# Patient Record
Sex: Female | Born: 1965 | Race: Black or African American | Hispanic: No | Marital: Single | Smoking: Never smoker
Health system: Southern US, Community
[De-identification: ages and names within clinical notes are randomized; demographics above are authoritative.]

## PROBLEM LIST (undated history)

## (undated) DIAGNOSIS — J40 Bronchitis, not specified as acute or chronic: Secondary | ICD-10-CM

## (undated) DIAGNOSIS — M19049 Primary osteoarthritis, unspecified hand: Secondary | ICD-10-CM

## (undated) DIAGNOSIS — I10 Essential (primary) hypertension: Secondary | ICD-10-CM

## (undated) DIAGNOSIS — K219 Gastro-esophageal reflux disease without esophagitis: Secondary | ICD-10-CM

## (undated) DIAGNOSIS — K5732 Diverticulitis of large intestine without perforation or abscess without bleeding: Secondary | ICD-10-CM

## (undated) DIAGNOSIS — N7011 Chronic salpingitis: Secondary | ICD-10-CM

## (undated) DIAGNOSIS — E538 Deficiency of other specified B group vitamins: Secondary | ICD-10-CM

## (undated) DIAGNOSIS — D649 Anemia, unspecified: Secondary | ICD-10-CM

## (undated) DIAGNOSIS — E785 Hyperlipidemia, unspecified: Secondary | ICD-10-CM

## (undated) DIAGNOSIS — E039 Hypothyroidism, unspecified: Secondary | ICD-10-CM

## (undated) HISTORY — DX: Gastro-esophageal reflux disease without esophagitis: K21.9

## (undated) HISTORY — DX: Hypothyroidism, unspecified: E03.9

## (undated) HISTORY — PX: WISDOM TOOTH EXTRACTION: SHX21

## (undated) HISTORY — PX: TUBAL LIGATION: SHX77

## (undated) HISTORY — PX: DILATION AND CURETTAGE OF UTERUS: SHX78

## (undated) HISTORY — DX: Hyperlipidemia, unspecified: E78.5

## (undated) HISTORY — DX: Diverticulitis of large intestine without perforation or abscess without bleeding: K57.32

---

## 2001-02-25 DIAGNOSIS — E039 Hypothyroidism, unspecified: Secondary | ICD-10-CM

## 2001-02-25 HISTORY — DX: Hypothyroidism, unspecified: E03.9

## 2004-02-26 HISTORY — PX: TOTAL ABDOMINAL HYSTERECTOMY: SHX209

## 2005-02-25 HISTORY — PX: WRIST ARTHROSCOPY: SUR100

## 2005-03-08 ENCOUNTER — Emergency Department: Payer: Self-pay | Admitting: Emergency Medicine

## 2005-03-08 ENCOUNTER — Other Ambulatory Visit: Payer: Self-pay

## 2005-06-20 ENCOUNTER — Ambulatory Visit (HOSPITAL_BASED_OUTPATIENT_CLINIC_OR_DEPARTMENT_OTHER): Admission: RE | Admit: 2005-06-20 | Discharge: 2005-06-20 | Payer: Self-pay | Admitting: Orthopedic Surgery

## 2005-06-21 ENCOUNTER — Encounter (INDEPENDENT_AMBULATORY_CARE_PROVIDER_SITE_OTHER): Payer: Self-pay | Admitting: Specialist

## 2005-08-21 ENCOUNTER — Emergency Department (HOSPITAL_COMMUNITY): Admission: EM | Admit: 2005-08-21 | Discharge: 2005-08-22 | Payer: Self-pay | Admitting: Emergency Medicine

## 2005-08-29 ENCOUNTER — Encounter (INDEPENDENT_AMBULATORY_CARE_PROVIDER_SITE_OTHER): Payer: Self-pay | Admitting: Specialist

## 2005-08-29 ENCOUNTER — Ambulatory Visit (HOSPITAL_BASED_OUTPATIENT_CLINIC_OR_DEPARTMENT_OTHER): Admission: RE | Admit: 2005-08-29 | Discharge: 2005-08-29 | Payer: Self-pay | Admitting: Orthopedic Surgery

## 2005-10-18 ENCOUNTER — Emergency Department (HOSPITAL_COMMUNITY): Admission: EM | Admit: 2005-10-18 | Discharge: 2005-10-18 | Payer: Self-pay | Admitting: Emergency Medicine

## 2006-02-25 HISTORY — PX: CHOLECYSTECTOMY: SHX55

## 2006-04-08 ENCOUNTER — Ambulatory Visit (HOSPITAL_COMMUNITY): Admission: RE | Admit: 2006-04-08 | Discharge: 2006-04-08 | Payer: Self-pay | Admitting: Family Medicine

## 2006-04-08 ENCOUNTER — Emergency Department (HOSPITAL_COMMUNITY): Admission: EM | Admit: 2006-04-08 | Discharge: 2006-04-08 | Payer: Self-pay | Admitting: Family Medicine

## 2006-05-14 ENCOUNTER — Encounter (INDEPENDENT_AMBULATORY_CARE_PROVIDER_SITE_OTHER): Payer: Self-pay | Admitting: Specialist

## 2006-05-14 ENCOUNTER — Ambulatory Visit (HOSPITAL_COMMUNITY): Admission: RE | Admit: 2006-05-14 | Discharge: 2006-05-15 | Payer: Self-pay | Admitting: Surgery

## 2007-02-07 ENCOUNTER — Emergency Department (HOSPITAL_COMMUNITY): Admission: EM | Admit: 2007-02-07 | Discharge: 2007-02-07 | Payer: Self-pay | Admitting: Emergency Medicine

## 2007-02-10 ENCOUNTER — Emergency Department (HOSPITAL_COMMUNITY): Admission: EM | Admit: 2007-02-10 | Discharge: 2007-02-10 | Payer: Self-pay | Admitting: Emergency Medicine

## 2007-05-29 ENCOUNTER — Encounter: Admission: RE | Admit: 2007-05-29 | Discharge: 2007-05-29 | Payer: Self-pay | Admitting: Family Medicine

## 2007-12-07 ENCOUNTER — Encounter: Admission: RE | Admit: 2007-12-07 | Discharge: 2007-12-07 | Payer: Self-pay | Admitting: Family Medicine

## 2008-03-21 ENCOUNTER — Encounter: Admission: RE | Admit: 2008-03-21 | Discharge: 2008-03-21 | Payer: Self-pay | Admitting: Family Medicine

## 2008-12-09 ENCOUNTER — Encounter: Admission: RE | Admit: 2008-12-09 | Discharge: 2008-12-09 | Payer: Self-pay | Admitting: Family Medicine

## 2009-03-29 ENCOUNTER — Encounter: Admission: RE | Admit: 2009-03-29 | Discharge: 2009-03-29 | Payer: Self-pay | Admitting: Family Medicine

## 2009-06-13 ENCOUNTER — Encounter: Admission: RE | Admit: 2009-06-13 | Discharge: 2009-06-13 | Payer: Self-pay | Admitting: Family Medicine

## 2010-07-04 ENCOUNTER — Encounter: Payer: Self-pay | Admitting: Family Medicine

## 2010-07-13 NOTE — Op Note (Signed)
NAME:  Ashley [name], Ashley [name]             ACCOUNT NO.:  [id number]   MEDICAL RECORD NO.:  [id number]          PATIENT TYPE:  AMB   LOCATION:  DAY                          FACILITY:  [facility]   PHYSICIAN:  Thomas A. Cornett, M.D.DATE OF BIRTH:  January 10, 1966   DATE OF PROCEDURE:  05/14/2006  DATE OF DISCHARGE:                               OPERATIVE REPORT   PREOPERATIVE DIAGNOSIS:  Symptomatic cholelithiasis.   POSTOPERATIVE DIAGNOSIS:  Symptomatic cholelithiasis.   PROCEDURE:  Laparoscopic cholecystectomy with intraoperative  cholangiogram.   SURGEON:  Thomas A. Cornett, M.D.   ASSISTANT:  Leonie Man, M.D.   ANESTHESIA:  General endotracheal anesthesia with approximately 20 cc of  0.25% Sensorcaine with epinephrine.   ESTIMATED BLOOD LOSS:  20 cc.   SPECIMENS:  Gallbladder with gallstones to pathology.   DRAINS:  None.   INDICATIONS FOR PROCEDURE:  The patient is a 45 year old female with  symptomatic cholelithiasis.  She presents today for laparoscopic  cholecystectomy.  She has treatment of this.  Informed consent was  obtained.  Risks and benefits were discussed preoperatively with the  patient.  She understood and agreed to proceed.   DESCRIPTION OF PROCEDURE:  The patient is brought to the operating suite  and placed supine.  After induction of general endotracheal anesthesia,  the abdomen was prepped and draped in a sterile fashion.  A 1 cm  supraumbilical incision was made.  This was carried down to her fascia.  The fascia was opened in the midline, and two Kochers were used to grab  the fascia.  A small hemostat was used to open the peritoneal lining to  the abdominal cavity.  There was no evidence of any intra-abdominal  adhesions.  A purse-string suture of 0 Vicryl was then placed, and a 12  mm Hasson cannula was placed under direct vision.  Pneumoperitoneum was  created to 15 mmHg of CO2, and a laparoscope was placed.  There were  some wispy adhesions in her pelvis  from previous hysterectomy.  Otherwise, no other major abnormality was noted upon brief inspection of  the intra-abdominal contents.  Gallbladder was identified and had  changes of chronic cholecystitis.  A subxiphoid port was placed with  local anesthesia under direct vision.  Two 5 mm ports were placed in the  right upper quadrant under direct vision as well.  The dome of the  gallbladder was grasped and pushed towards the patient's right shoulder.  A second grasper is used to hold some omental adhesions away from the  anterior surface of the gallbladder, and cautery is used to take these  down.  The infundibulum is then identified, grabbed by a grasper, and  pulled from the patient's right lower quadrant.  The peritoneum was  cauterized at the gallbladder and pushed down.  The cystic duct was  identified.  This is the only tubular structure entered into the  gallbladder.  This is dissected out circumferentially.  A clip was  placed on the gallbladder side of the cystic duct, and a small incision  was made with the laparoscopic scissors.  Through a separate stab wound,  a  Cook cholangiogram catheter is introduced, placed in the cystic duct,  and held in place by a clip.  Intraoperative cholangiogram was then  performed using 0.5% strength Hypaque dye and fluoroscopy.  There is  free flow of contrast down a tortuous cystic duct, down a common duct,  into the duodenum without signs of stones, stricture, or extravasation.  There is free flow of contrast at the common hepatic duct to the  bifurcation of right and left hepatic duct.  There is no dilatation or  filling defects noted in either.  The cholangiogram was completed.  The  catheter was removed.  The cystic duct was then triple clipped and  divided.  The cystic artery was identified.  At this point, it was  double-clipped and divided.  The cautery was used to dissect the  gallbladder from the gallbladder fossa.  The gallbladder was  partially  intrahepatic.  The gallbladder was then removed from the gallbladder  fossa and placed in an EndoCatch bag.  There was some oozing from the  gallbladder bed, and this was controlled with cautery and Surgicel with  an excellent response.  Irrigation was used and suctioned out.  The  cystic duct and cystic artery were identified.  There is no evidence of  bile or bleeding.  At this point in time, the gallbladder was extracted  to the umbilicus and passed off the field.  The umbilical port was  closed with a purse-string suture of Vicryl already in place.  At this  point in time, excess fluid was suctioned out.  I reinspected the  abdominal cavity and saw no evidence of injury.  The ports were removed.  CO2 was allowed to escape.  The instruments were passed off the field.  The skin was closed with 4-0 Monocryl.  Dry dressings were applied.  All  final counts of sponge, instrument, and needles were found to be  correct.  The patient was awakened and taken to recovery in satisfactory  condition.      Thomas A. Cornett, M.D.  Electronically Signed     TAC/MEDQ  D:  05/14/2006  T:  05/14/2006  Job:  [id number]   cc:   Elvina Sidle, M.D.  Fax: [phone]

## 2010-07-13 NOTE — Op Note (Signed)
NAME:  Ashley Ashley [name], Ashley [name]             ACCOUNT NO.:  [id number]   MEDICAL RECORD NO.:  [id number]          PATIENT TYPE:  AMB   LOCATION:  DSC                          FACILITY:  [facility]   PHYSICIAN:  Katy Fitch. Sypher, M.D. DATE OF BIRTH:  Apr 16, 1965   DATE OF PROCEDURE:  06/20/2005  DATE OF DISCHARGE:                                 OPERATIVE REPORT   PREOPERATIVE DIAGNOSIS:  Poorly characterized, ulnar-sided, right dominant  wrist pain dating back to September 2005 that developed following a series  of repetitive lifting experiences on the job.   Characterization of this pain has been challenging despite two prior MRIs  and repeated clinical examination. It has not responded to prior steroid  injection by physicians in [place], [place].  A MRI of the right wrist  was obtained on February 04, 2004 in [place], [place], and was  interpreted to be normal by Dr. Knute Neu, including normal carpal bone images,  no sign of synovitis and no sign of pisotriquetral joint arthropathy. Ms.  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   She has had chronic ulnar-sided wrist pain, perceived in the region of the  fovea and along the pisotriquetral joint.   She was seen for an independent medical evaluation on February 13, 2005 and  on clinical examination at that time was noted to have persistent tenderness  along the pisotriquetral joint, tenderness on palpation of the hook of  hamate and a painful pisotriquetral grind test.  An ulnocarpal grind was  also uncomfortable at the site of grasping the triquetrum and pisiform  forearm.   I referred her for a second MRI that was accomplished at [facility] on  March 31, 2005.  This was interpreted by Dr. Suzie Portela to reveal no sign of a  stress fracture, marrow edema or triangle fibrocartilage pathology.  There  was no  evidence of a dissociative carpal instability.  There was a slightly  dorsal posture the ulna and widening of the distal radioulnar joint.  However, as the study was obtained in pronation, this was probably a normal  variant.   Careful inspection of the films at the office suggested that there was a  recess proximal to the pisiform consistent with a possible partial flexor  carpi ulnaris tendon injury, and there was some cystic change in the palmar  aspect of the lunate and triquetrum that suggested perhaps some synovitis on  the volar aspect of the wrist.   Due to a failure to improve over 18 months and with persistent pain with  stress of the wrist in a supinated posterior or with supination and ulnar  deviation, Ashley Ashley [name] requested diagnostic arthroscopy at this time.   Preoperatively, she was advised that we could not guarantee complete relief  of her pain.  The primary goal of this intervention is diagnostic  information with the hope that we could identify a lesion amendable to  surgical correction or improvement.   In the holding area preoperatively, Ashley Ashley [name] was reexamined  carefully  and was found to have exquisite tenderness along the ulnar aspect of her  pisotriquetral joint and pain on palpation of the flexor carpi ulnaris  insertion at the base of the pisiform.   We advised proceeding with diagnostic arthroscopy, anticipating synovectomy,  inspection of  the pisotriquetral joint and appropriate intervention with  possible piriform excision if we found evidence of significant tendinopathy  and/or pisotriquetral arthrosis.   Questions were invited and answered.   Preoperatively, Ashley Ashley [name] and her rehab nurse were advised that would be  out of work between two and eight weeks, depending on the intervention  provided.   PROCEDURE:  Ashley Ashley [name] was brought to the operating room and placed in  supine position upon the operating table.   Following an  anesthesia consultation by Dr. Gelene Mink, general anesthesia by  LMA was induced.   The right arm was prepped with Betadine soap solution and sterilely draped.  Pneumatic tourniquet was applied to the proximal brachium.   Following exsanguination of the right arm with an Esmarch bandage, the  tourniquet was inflated to 220 mmHg.   The procedure commenced with distraction of Ashley Ashley [name] wrist in a tower  designed for wrist arthroscopy with metal finger traps on the index and long  fingers and counter traction on the forearm. Ten pounds of traction was  provided. The wrist was instrumented with a 18-gauge needle and distended  with sterile saline, approximately 3 cc total volume.  The scope was placed  with blunt technique through a standard ______________ portal.  Diagnostic  arthroscopy revealed intact hyaline articular cartilage surfaces on the  scaphoid, lunate and triquetrum. The scapholunate interosseous ligament was  normal.  The lunatotriquetral interosseous ligament was normal. The  attachment of the triangle fibrocartilage had a partial thickness injury at  its central dorsal third that was no more than half of the thickness of the  triangle fibrocartilage.  There was a degenerative rim tear of the dorsal  triangle fibrocartilage adjacent to the capsule which I did not feel was  clinically significant.  There was synovitis in the region of the prestyloid  recess and a ragged-appearing capsular defect that allowed visualization of  the pisotriquetral joint.  The inferior third of the pisotriquetral joint  could be visualized.  The hyaline cartilage on the pisiform appeared normal.   I attempted to advance the scope into this but could not do so, even after  switching to the 6R  portal.   A suction shaver was used to debride synovitis along the margin of the triangle fibrocartilage and ulnocarpal ligaments an effort to visualize the  pisotriquetral capsular injury in more  detail.  The scope was placed in the  6R portal, and a probe was placed in 6U portal.  By palpation, there was  considerable damage to the capsule and the ulnotriquetral ligament.   After an attempt to place a suction shaver in this region to increase  visualization, I decided to explore this region directly given concern of  possible neurovascular injury with use of a suction shaver near the dorsal  ulnar sensory branch.   The arthroscopic equipment was removed followed by exposure of the  pisotriquetral joint through a curvilinear incision paralleling the distal  palmar crease and the glabrous skin margin on the ulnar aspect of the  hypothenar eminence.   The interval between the abductor digiti minimi and the dorsal fascia was  incised, and the capsule of the pisotriquetral joint identified.  After  arthrotomy, the hyaline articular cartilage surfaces of the pisiform and  triquetrum were well visualized.  There was an injury to the capsule of the  wrist joint that paralleled the ulnar and proximal border of the triquetrum  that measured approximately 7 mm in length and 3 to 4 mm in width.  There  was moderate synovitis in this region.   The insertion of the flexor carpi ulnaris was carefully inspected at the  base of the pisiform.  There was noted to be a degenerative cavitary  tendinopathy not unlike one would see at a degenerative rotator cuff or  degenerative elbow extensor origin.   This was debrided with a rongeur and found to have extensive changes within  the flexor carpi ulnaris tendon.   It is possible this was consequent to prior steroid injection or could be a  simple degenerative process due to heavy repetitive lifting in the past.   Given this finding, I decided to proceed with pisiform resection and tendon  repair.   The pisiform was grasped with a towel clip and meticulously dissected with a  sharp 4-mm osteotome and beaver blade.  This was removed en bloc and  passed  off for pathologic evaluation.  The hyaline articular cartilage surfaces of  the pisiform were normal for age with minor chondromalacia.   The area of degenerative tendinopathy was then debrided with a rongeur  followed by bipolar cautery of bleeders within the capsule of the  pisotriquetral joint.   The rim of the triquetrum was freshened with a fine rongeur followed by  repair of the capsular injury and ulnocarpal/ulnotriquetral ligament with  two mattress sutures of 3-0 Ethibond.   The abductor digiti minimi and the flexor carpi ulnaris were then repaired  with figure-of-eight sutures of 3-0 FiberWire followed by repair of the skin  with subdermal sutures of 4-0 Vicryl and intradermal 3-0 Prolene.   Ashley Ashley [name] was placed in a compressive dressing with a volar plaster  splint maintaining the wrist in 10-degrees palmar flexion.  There were no apparent complications.   Ashley Ashley [name] awakened from anesthesia and transferred to recovery room with  stable signs.   She will be discharged to the care of her family with instructions to return  to our office and will follow up in one week.  She is provided prescriptions  for Percocet 5 mg 1 to 2 tablets p.o. q.4-6h. p.r.n. pain 30 tablets without  refill.  Also Motrin 600 mg 1 p.o. q.6h. p.r.n. pain 30 tablets with 1  refill and Keflex 500 mg 1 p.o. q.8h. for 4 days as prophylactic antibiotic.      Katy Fitch Sypher, M.D.  Electronically Signed     RVS/MEDQ  D:  06/20/2005  T:  06/21/2005  Job:  [id number]

## 2010-07-13 NOTE — Op Note (Signed)
NAME:  Ashley, [name]             ACCOUNT NO.:  [id number]   MEDICAL RECORD NO.:  [id number]          PATIENT TYPE:  AMB   LOCATION:  DSC                          FACILITY:  [facility]   PHYSICIAN:  Katy Fitch. Sypher, M.D. 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   OPERATION:  Exploration of right wrist ulnar-sided wound with mass  consistent with suture granuloma, with identification of Vicryl suture  granuloma and an adherent small dorsal ulnar sensory branch, followed by  resection of minor dorsal ulnar sensory branch to dorsal surface of small  finger metacarpal.   OPERATING SURGEON:  Katy Fitch. Sypher, M.D., no assistant.   ANESTHESIA:  2% lidocaine field block of dorsal ulnar sensory branch and  wound, supplemented by 0.25% Marcaine for postoperative analgesia and IV  sedation, supervising anesthesiologist is Dr. Krista Blue.   INDICATIONS:  Ashley [name] is a 45 year old woman referred for  evaluation and management of chronic ulnar-sided right wrist pain.   She had sustained an injury to wrist in September 2005 and had a poorly-  characterized pain syndrome.   After a lengthy period of observation, therapy and imaging, we ultimately  explored her wrist by wrist arthroscopy and identified an ulnar capsular  injury with disruption of the pisotriquetral joint capsule and an  ulnotriquetral ligament injury.   We excised her pisiform and repaired her ulnar capsule and ulnocarpal  ligaments on June 20, 2005.   At that time we meticulously dissected her dorsal ulnar sensory branch and  protected it throughout the capsular repair and pisiform excision.   While healing her wound, she noted significant improvement in her ulnar-  sided wrist pain; however, she developed a small 3 mm diameter mass that was  painful at the distal and dorsal aspect of her ulnar-sided wound created to  expose the pisotriquetral joint and her capsular injury.   After a period of observation, it was clear that this was either suture  granuloma or a combination of suture granuloma and small neuroma.  We  recommended exploration and excision at this time.   She had intact sensibility on the dorsal surfaces of her ring and small  fingers and had only minor numbness around her wound.   After informed consent, she is brought to the operating room at this time.   PROCEDURE:  Ashley [name] is brought to the operating room and placed  in supine position on the table.  Following anesthesia consultation by Dr.  Krista Blue, monitored anesthesia care with field block was selected.   Following routine Betadine scrub and paint of the right arm, the arm was  exsanguinated with an Esmarch bandage an arterial tourniquet on the proximal  brachium inflated to 250 mmHg.  Lidocaine 2% was  infiltrated in the region  of the dorsal ulnar sensory branch and around the margins of the wound.   We had marked the site of maximum tenderness and the visible mass  preoperatively with our proper site identification protocol.   The procedure commenced with an elliptical excision of scar and the mass.   The subcutaneous tissues were carefully divided, identifying the dorsal  ulnar sensory branch proximally.   There was a branching of the dorsal ulnar sensory branch with a small branch  extending towards the dorsal ulnar surface of the small finger metacarpal.  This branch appeared to be adherent or involved in the area of suture  granuloma.   I dissected proximally and distally, protecting the main dorsal ulnar   sensory branch, and found that the mass either incorporated the dorsal ulnar  sensory branch of the small finger metacarpal or was simply adherent to it.   After studying this with loupe magnification, I finally concluded that I  could not safely dissect the branch free and therefore elected to sacrifice  this small branch with the granuloma.   The proximal portion of this branch was placed under traction and  subsequently transected with a fresh 15 scalpel blade.  The distal portion  of the branch was likewise transected.  The branch was allowed to retract  with the main trunk of the dorsal ulnar sensory branch into a bed of healthy  fat.   Ashley [name] will likely have a small area of numbness on the dorsal ulnar  aspect of her small finger metacarpal.  However, in my judgment, attempts to  dissect this free could cause a painful neuroma incontinuity that would  likely be more troublesome than a small area of numbness distal to the scar.   The wound was inspected for bleeding points.  No other pathologies were  noted.  The resected mass was sent en bloc in formalin to the lab for  identification.   The wound was repaired with intradermal 3-0 Prolene without subsequent use  of Vicryl subcutaneous suture.   The wound margins were infiltrated with 0.25% Marcaine for postoperative  analgesia, followed by placement of Steri-Strips.  Ashley [name] was placed  in a compressive dressing with sterile gauze and an Ace wrap.      Katy Fitch Sypher, M.D.  Electronically Signed     RVS/MEDQ  D:  08/29/2005  T:  08/29/2005  Job:  [id number]

## 2010-07-17 ENCOUNTER — Encounter (INDEPENDENT_AMBULATORY_CARE_PROVIDER_SITE_OTHER): Payer: 59 | Admitting: Obstetrics & Gynecology

## 2010-07-17 DIAGNOSIS — Z01419 Encounter for gynecological examination (general) (routine) without abnormal findings: Secondary | ICD-10-CM

## 2010-07-19 NOTE — Assessment & Plan Note (Signed)
NAME:  Ashley [name], Ashley [name] NO.:  [id number]  MEDICAL RECORD NO.:  [id number]           PATIENT TYPE:  LOCATION:  [facility]           FACILITY:  PHYSICIAN:  Allie Bossier, MD             DATE OF BIRTH:  DATE OF SERVICE:  07/17/2010                                 CLINIC NOTE  Ms. Dooley is a 45 year old single African American G5, P3, A2.  She had sons aged 66, 36, and 39, and she is here for her annual exam.  She has no GYN complaints.  PAST MEDICAL HISTORY:  Significant for hyperthyroidism which was then treated with radioactive iodine.  She has not had any recent TSH checked.  She denies any hypothyroid symptoms.  PAST SURGICAL HISTORY:  She has had 2 D and Cs (1 after a spontaneous AB and 1 for an elective AB).  She had a TAH for fibroids done, cholecystectomy, and right wrist arthroscopy.  SOCIAL HISTORY:  She denies tobacco.  Drinks only 1 or 2 times a year and denies illegal drug use.  No known drug allergies.  No latex allergies.  FAMILY HISTORY:  Positive for breast cancer in a maternal great aunt and hypertension in her parents.  She denies family history of GYN or colon malignancies.  REVIEW OF SYSTEMS:  Her mammogram is due.  The remainder of review of systems and questions are negative.  PHYSICAL EXAMINATION:  GENERAL:  Well-nourished, well-hydrated pleasant black female. VITAL SIGNS:  Height 5 feet 6 inches, weight 162 pounds, blood pressure 121/91, pulse 80. HEENT:  Normal. BREASTS:  Normal bilaterally. HEART:  Regular rate and rhythm. LUNGS:  Clear to auscultation bilaterally. ABDOMEN:  Benign. GU:  External genitalia, no lesions.  Vaginal cuff good support.  Normal discharge.  No lesions.  Bimanual exam reveals no masses.  ASSESSMENT AND PLAN:  Annual exam.  I have recommend self-breast and self-vulvar exams with regard to her general health maintenance.  Plan to check a fasting lipid, glucose, and her TSH at her convenience,  and we will schedule her mammogram.     Allie Bossier, MD    MCD/MEDQ  D:  07/17/2010  T:  07/18/2010  Job:  [id number]

## 2010-12-20 ENCOUNTER — Encounter: Payer: Self-pay | Admitting: Obstetrics & Gynecology

## 2010-12-20 ENCOUNTER — Ambulatory Visit (INDEPENDENT_AMBULATORY_CARE_PROVIDER_SITE_OTHER): Payer: 59 | Admitting: Obstetrics & Gynecology

## 2010-12-20 VITALS — BP 134/93 | Ht 66.0 in | Wt 157.1 lb

## 2010-12-20 DIAGNOSIS — R300 Dysuria: Secondary | ICD-10-CM

## 2010-12-20 MED ORDER — CIPROFLOXACIN HCL 500 MG PO TABS: 500.0000 mg | ORAL_TABLET | Freq: Two times a day (BID) | ORAL | Status: AC

## 2010-12-20 MED ORDER — FLAVOXATE HCL 100 MG PO TABS: 100.0000 mg | ORAL_TABLET | Freq: Three times a day (TID) | ORAL | Status: DC | PRN

## 2010-12-20 NOTE — Patient Instructions (Signed)

## 2010-12-20 NOTE — Progress Notes (Signed)
  Subjective:    Patient ID: Ashley [name], female    DOB: [birth year], 45 y.o.   MRN: [id number]  HPI  Ashley [name] is here with a 2 week history of vaginal pain and dysuria and foul smelling urine. She denies fevers  Review of Systems     Objective:   Physical Exam   No CVAT UA shows 1+ leuks and nitrites     Assessment & Plan:  UTI- script for cipro and urispas UC&S done

## 2010-12-26 ENCOUNTER — Telehealth: Payer: Self-pay

## 2010-12-26 ENCOUNTER — Other Ambulatory Visit: Payer: Self-pay | Admitting: Gynecology

## 2010-12-26 DIAGNOSIS — B373 Candidiasis of vulva and vagina: Secondary | ICD-10-CM

## 2010-12-26 DIAGNOSIS — B3731 Acute candidiasis of vulva and vagina: Secondary | ICD-10-CM

## 2010-12-26 MED ORDER — FLUCONAZOLE 150 MG PO TABS: 150.0000 mg | ORAL_TABLET | Freq: Once | ORAL | Status: AC

## 2010-12-26 NOTE — Telephone Encounter (Signed)
Diflucan 150mg  was send to pt pharmacy.

## 2010-12-26 NOTE — Telephone Encounter (Signed)
PATIENT TOOK AN ANTIBIOTIC AND NOW SHE HAS DEVELOPED A YEAST INFECTION. PLEASE CALL SOMETHING IN TO HER PHARMACY [facility] ON [address] IN [place]. THANKS!

## 2011-01-04 ENCOUNTER — Emergency Department (INDEPENDENT_AMBULATORY_CARE_PROVIDER_SITE_OTHER)
Admission: EM | Admit: 2011-01-04 | Discharge: 2011-01-04 | Disposition: A | Discharge status: Home / Self Care | Payer: 59 | Source: Home / Self Care

## 2011-01-04 ENCOUNTER — Encounter (HOSPITAL_COMMUNITY): Payer: Self-pay | Admitting: Emergency Medicine

## 2011-01-04 DIAGNOSIS — J40 Bronchitis, not specified as acute or chronic: Secondary | ICD-10-CM

## 2011-01-04 HISTORY — DX: Bronchitis, not specified as acute or chronic: J40

## 2011-01-04 MED ORDER — IBUPROFEN 600 MG PO TABS: 600.0000 mg | ORAL_TABLET | Freq: Four times a day (QID) | ORAL | Status: AC | PRN

## 2011-01-04 MED ORDER — FLUTICASONE PROPIONATE 50 MCG/ACT NA SUSP: 2.0000 | Freq: Every day | NASAL | Status: DC

## 2011-01-04 MED ORDER — HYDROCOD POLST-CHLORPHEN POLST 10-8 MG/5ML PO LQCR: 5.0000 mL | Freq: Two times a day (BID) | ORAL | Status: DC | PRN

## 2011-01-04 MED ORDER — ALBUTEROL SULFATE HFA 108 (90 BASE) MCG/ACT IN AERS: 1.0000 | INHALATION_SPRAY | Freq: Four times a day (QID) | RESPIRATORY_TRACT | Status: DC | PRN

## 2011-01-04 MED ORDER — AZITHROMYCIN 250 MG PO TABS: 250.0000 mg | ORAL_TABLET | Freq: Every day | ORAL | Status: AC

## 2011-01-04 NOTE — ED Notes (Signed)
Pt comes in with cold sx sore throat,cough with yellow mucous and nasal congestion that started x 3 dys ago.pt has hx bronchitis and sinuses.denies sob or cp

## 2011-01-04 NOTE — ED Provider Notes (Signed)
History     CSN: [id number] Arrival date & time: 01/04/2011  1:02 PM   First MD Initiated Contact with Patient 01/04/11 1351      Chief Complaint  Patient presents with  . URI  . Sinusitis    (Consider location/radiation/quality/duration/timing/severity/associated sxs/prior treatment) Patient is a 45 y.o. female presenting with URI. The history is provided by the patient.  URI The primary symptoms include sore throat, cough and myalgias. Primary symptoms do not include fever, fatigue, ear pain, swollen glands, wheezing, abdominal pain, nausea, vomiting or rash. The current episode started 3 to 5 days ago. This is a new problem. The problem has been gradually worsening.  The sore throat is not accompanied by trouble swallowing.  The myalgias are not associated with weakness.   Symptoms associated with the illness include congestion and rhinorrhea. The illness is not associated with chills, plugged ear sensation, facial pain or sinus pressure.   Pt with nasal congestion, postnasal drip, nonproductive cough, chest soreness after coughing x 3 days. unable to sleep at night secondary to cough. No wheeze, SOB, pleuritic CP, fevers. Similar sx last week which resolved. States this episode worse than recent episode.   History reviewed. No pertinent past medical history.  Past Surgical History  Procedure Date  . Abdominal hysterectomy   . Cholecystectomy   . Arthoscopy r hand     No family history on file.  History  Substance Use Topics  . Smoking status: Never Smoker   . Smokeless tobacco: Not on file  . Alcohol Use: No    OB History    Grav Para Term Preterm Abortions TAB SAB Ect Mult Living                  Review of Systems  Constitutional: Negative for fever, chills and fatigue.  HENT: Positive for congestion, sore throat, rhinorrhea and postnasal drip. Negative for ear pain, trouble swallowing, voice change and sinus pressure.   Eyes: Negative for redness.    Respiratory: Positive for cough. Negative for chest tightness, shortness of breath and wheezing.   Cardiovascular: Negative for chest pain (chest soreness).  Gastrointestinal: Negative for nausea, vomiting and abdominal pain.  Musculoskeletal: Positive for myalgias.  Skin: Negative for rash.  Neurological: Negative for weakness.    Allergies  Review of patient's allergies indicates no known allergies.  Home Medications  No current outpatient prescriptions on file.  BP 154/90  Pulse 90  Temp(Src) 98.3 F (36.8 C) (Oral)  Resp 18  SpO2 100%  Physical Exam  Nursing note and vitals reviewed. Constitutional: She is oriented to person, place, and time. She appears well-developed and well-nourished.  HENT:  Head: Normocephalic and atraumatic.  Right Ear: Tympanic membrane, external ear and ear canal normal.  Left Ear: Tympanic membrane, external ear and ear canal normal.  Nose: Mucosal edema and rhinorrhea present. No epistaxis.  Mouth/Throat: Oropharynx is clear and moist.  Eyes: Conjunctivae and EOM are normal. Pupils are equal, round, and reactive to light.  Neck: Normal range of motion.  Cardiovascular: Normal rate, regular rhythm and normal heart sounds.   Pulmonary/Chest: Effort normal and breath sounds normal. No respiratory distress. She has no wheezes. She has no rhonchi. She has no rales.       coughing  Abdominal: She exhibits no distension. There is no tenderness. There is no rebound and no guarding.  Musculoskeletal: Normal range of motion. She exhibits no edema and no tenderness.  Lymphadenopathy:    She has no cervical  adenopathy.  Neurological: She is alert and oriented to person, place, and time.  Skin: Skin is warm and dry. No rash noted.  Psychiatric: She has a normal mood and affect. Her behavior is normal. Judgment and thought content normal.    ED Course  Procedures (including critical care time)  Labs Reviewed - No data to display No results  found.   No diagnosis found.    MDM  Identical sx last week with double sickening will send home with azithro and supportive care       Danella Maiers [facility] 01/04/11 1527

## 2011-01-31 ENCOUNTER — Ambulatory Visit: Payer: 59 | Admitting: Obstetrics & Gynecology

## 2011-01-31 ENCOUNTER — Encounter: Payer: Self-pay | Admitting: Gynecology

## 2011-03-19 ENCOUNTER — Ambulatory Visit (INDEPENDENT_AMBULATORY_CARE_PROVIDER_SITE_OTHER): Payer: 59 | Admitting: Obstetrics and Gynecology

## 2011-03-19 ENCOUNTER — Encounter: Payer: Self-pay | Admitting: Obstetrics and Gynecology

## 2011-03-19 VITALS — BP 130/78 | HR 74 | Ht 66.0 in | Wt 158.0 lb

## 2011-03-19 DIAGNOSIS — N760 Acute vaginitis: Secondary | ICD-10-CM

## 2011-03-19 NOTE — Progress Notes (Signed)
  Subjective:    Patient ID: Ashley [name], female    DOB: [birth year], 46 y.o.   MRN: [id number]  HPI  46 yo G5P3 presenting today for evaluation of vaginal pruritis and concerns for recurrent yeast infections. Patient reports having yeast infections following completion of antibiotics. Patient most recently completed a course of antibiotics for dental procedure. Patient reports self treating with over the counter medication but symptoms seem to have worsen. Patient reports small discharge but intense pruritis.   Review of Systems     Objective:   Physical Exam   PELVIC: Normal external female genitalia. Vagina is pink and rugated.  Normal appearing cervix. Copious amount of thin white discharge, no odor     Assessment & Plan:  46 yo G5P3 with vaginitis - wet prep collected - patient will be contacted with any abnormal results - patient to return in May for annual exam

## 2011-03-19 NOTE — Patient Instructions (Signed)
Vaginitis Vaginitis is an infection. It causes soreness, swelling, and redness (inflammation) of the vagina. Many of these infections are sexually transmitted diseases. Having unprotected sex can cause further problems and complications such as:  Chronic pelvic pain.   Infertility.   Unwanted pregnancy.   Abortions.   Tubal pregnancy.   Infection passed on to the newborn baby.   Cancer.  CAUSES   Monilia. A yeast/fungus infection, not a sexually transmitted disease.   Bacterial vaginosis. The normal balance of bacteria in the vagina is disrupted and is replaced by an overgrowth of certain bacteria.   Gonorrhea, chlamydia. These are bacterial infections that are sexually transmitted diseases (STD).   Vaginal sponges, diaphragms, and intrauterine devices.   Trichomoniasis. This is a protozoa parasite that is a STD.   Viruses like herpes and human papilloma virus. Both are STD's.   Pregnancy.   Immunosupression (HIV).   Using bubble bath.   Taking certain medications that kill germs (antibiotics).   Sporadic recurrence can occur if you become ill.   Diabetes.   Steriods.   Allergic reaction. If you have an allergy to:   Douches.   Soaps.   Spermicides.   Condoms.   Scented tampons and vaginal sprays.  SYMPTOMS   Abnormal vaginal discharge.   Itching of the vagina.   Pain in the vagina.   Swelling of the vagina.   In some cases, there are no symptoms.  TREATMENT  Treatment will vary depending on the type of infection.  Bacteria and trichomonas - usually oral antibiotics and sometimes vaginal cream or suppositories.   Monilia vaginitis - vaginal creams, suppositories, or oral antifungal.   Viral vaginitis has no cure. However, the symptoms of herpes (a viral vaginitis) can be treated to relieve the discomfort. Human papilloma virus has no symptoms . However, there are treatments for the diseases caused by human papilloma virus.   Allergic  vaginitis. Stop using the product that is causing the problem. Vaginal creams can be used to treat the symptoms.   When treating an STD, the sex partner should also be treated.  HOME CARE INSTRUCTIONS   Take all the medications as prescribed by your caregiver.   Do not use scented tampons, soaps, or vaginal sprays.   Do not douche.   Tell your sex partner if you have a vaginal infection or a STD.   Do not have sexual intercourse until you have cured the vaginitis.   Practice safe sex by using condoms .  SEEK MEDICAL CARE IF:   You have an oral temperature above 100.4 F (38 C).   You develop abdominal pain.   Your symptoms get worse during treatment.  Document Released: 12/09/2006 Document Revised: 08/27/2010 Document Reviewed: 08/04/2008 [facility] Patient Information 2012 [place], [place].

## 2011-03-20 ENCOUNTER — Other Ambulatory Visit: Payer: Self-pay | Admitting: *Deleted

## 2011-03-20 ENCOUNTER — Other Ambulatory Visit: Payer: Self-pay | Admitting: Gynecology

## 2011-03-20 DIAGNOSIS — N760 Acute vaginitis: Secondary | ICD-10-CM

## 2011-03-20 DIAGNOSIS — B9689 Other specified bacterial agents as the cause of diseases classified elsewhere: Secondary | ICD-10-CM

## 2011-03-20 LAB — WET PREP, GENITAL

## 2011-03-20 MED ORDER — METRONIDAZOLE 0.75 % VA GEL: 1.0000 | Freq: Two times a day (BID) | VAGINAL | Status: AC

## 2011-07-23 ENCOUNTER — Ambulatory Visit (INDEPENDENT_AMBULATORY_CARE_PROVIDER_SITE_OTHER): Payer: 59 | Admitting: Obstetrics & Gynecology

## 2011-07-23 ENCOUNTER — Encounter: Payer: Self-pay | Admitting: Obstetrics & Gynecology

## 2011-07-23 VITALS — BP 133/83 | HR 81 | Ht 66.0 in | Wt 158.0 lb

## 2011-07-23 DIAGNOSIS — Z0000 Encounter for general adult medical examination without abnormal findings: Secondary | ICD-10-CM

## 2011-07-23 NOTE — Progress Notes (Signed)
Addended by: Allie Bossier on: 07/23/2011 04:28 PM   Modules accepted: Orders

## 2011-07-23 NOTE — Progress Notes (Signed)
Subjective:    Ashley [name] is a 46 y.o. female who presents for an annual exam. The patient has no complaints today. The patient is sexually active. GYN screening history: last pap: was normal. The patient wears seatbelts: yes. The patient participates in regular exercise: yes. Has the patient ever been transfused or tattooed?: yes. The patient reports that there is not domestic violence in her life.   Menstrual History: OB History    Grav Para Term Preterm Abortions TAB SAB Ect Mult Living   5 3        2       Menarche age: 55 No LMP recorded. Patient has had a hysterectomy.    The following portions of the patient's history were reviewed and updated as appropriate: allergies, current medications, past family history, past medical history, past social history, past surgical history and problem list.  Review of Systems A comprehensive review of systems was negative.    Objective:    BP 133/83  Pulse 81  Ht 5\' 6"  (1.676 m)  Wt 71.668 kg (158 lb)  BMI 25.50 kg/m2  General Appearance:    Alert, cooperative, no distress, appears stated age  Head:    Normocephalic, without obvious abnormality, atraumatic  Eyes:    PERRL, conjunctiva/corneas clear, EOM's intact, fundi    benign, both eyes  Ears:    Normal TM's and external ear canals, both ears  Nose:   Nares normal, septum midline, mucosa normal, no drainage    or sinus tenderness  Throat:   Lips, mucosa, and tongue normal; teeth and gums normal  Neck:   Supple, symmetrical, trachea midline, no adenopathy;    thyroid:  no enlargement/tenderness/nodules; no carotid   bruit or JVD  Back:     Symmetric, no curvature, ROM normal, no CVA tenderness  Lungs:     Clear to auscultation bilaterally, respirations unlabored  Chest Wall:    No tenderness or deformity   Heart:    Regular rate and rhythm, S1 and S2 normal, no murmur, rub   or gallop  Breast Exam:    No tenderness, masses, or nipple abnormality  Abdomen:     Soft,  non-tender, bowel sounds active all four quadrants,    no masses, no organomegaly  Genitalia:    Normal female without lesion, discharge or tenderness, moderate amount of thinnish white discharge, no odor, normal bimanual exam     Extremities:   Extremities normal, atraumatic, no cyanosis or edema  Pulses:   2+ and symmetric all extremities  Skin:   Skin color, texture, turgor normal, no rashes or lesions  Lymph nodes:   Cervical, supraclavicular, and axillary nodes normal  Neurologic:   CNII-XII intact, normal strength, sensation and reflexes    throughout  .    Assessment:    Healthy female exam.    Plan:     Wet prep.  Mammogram

## 2011-07-24 LAB — WET PREP, GENITAL: Trich, Wet Prep: NONE SEEN

## 2011-07-29 ENCOUNTER — Ambulatory Visit: Payer: 59

## 2011-08-01 ENCOUNTER — Ambulatory Visit
Admission: RE | Admit: 2011-08-01 | Discharge: 2011-08-01 | Disposition: A | Discharge status: Home / Self Care | Payer: 59 | Source: Ambulatory Visit | Attending: Obstetrics & Gynecology | Admitting: Obstetrics & Gynecology

## 2011-08-01 DIAGNOSIS — Z0000 Encounter for general adult medical examination without abnormal findings: Secondary | ICD-10-CM

## 2011-08-12 ENCOUNTER — Telehealth: Payer: Self-pay

## 2011-08-12 NOTE — Telephone Encounter (Signed)
PATIENT CALLED SHE HAS DEVELOPED A YEAST INFECTION NEEDS SOMETHING CALLED IN TO HER PHARMACY. PER NURSE I WILL CALL IN SOME DIFLUCAN 150 MG.

## 2011-09-10 ENCOUNTER — Ambulatory Visit (INDEPENDENT_AMBULATORY_CARE_PROVIDER_SITE_OTHER): Payer: Self-pay | Admitting: Obstetrics & Gynecology

## 2011-09-10 ENCOUNTER — Encounter: Payer: Self-pay | Admitting: Obstetrics & Gynecology

## 2011-09-10 VITALS — BP 116/78 | HR 86 | Ht 66.0 in | Wt 162.5 lb

## 2011-09-10 DIAGNOSIS — R300 Dysuria: Secondary | ICD-10-CM

## 2011-09-10 DIAGNOSIS — N390 Urinary tract infection, site not specified: Secondary | ICD-10-CM

## 2011-09-10 DIAGNOSIS — B373 Candidiasis of vulva and vagina: Secondary | ICD-10-CM

## 2011-09-10 DIAGNOSIS — B3731 Acute candidiasis of vulva and vagina: Secondary | ICD-10-CM

## 2011-09-10 LAB — POCT URINALYSIS DIPSTICK: Urobilinogen, UA: 0.2

## 2011-09-10 MED ORDER — BORIC ACID CRYS: 600.0000 mg | CRYSTALS | Status: DC

## 2011-09-10 MED ORDER — FLUCONAZOLE 150 MG PO TABS: 150.0000 mg | ORAL_TABLET | Freq: Once | ORAL | Status: AC

## 2011-09-10 MED ORDER — CIPROFLOXACIN HCL 500 MG PO TABS: 500.0000 mg | ORAL_TABLET | Freq: Two times a day (BID) | ORAL | Status: AC

## 2011-09-10 MED ORDER — FLAVOXATE HCL 100 MG PO TABS: 100.0000 mg | ORAL_TABLET | Freq: Three times a day (TID) | ORAL | Status: DC | PRN

## 2011-09-10 NOTE — Progress Notes (Signed)
Subjective:    Ashley [name] is a 46 y.o. G1P3 female  who complains of burning with urination and frequency for 1 week.  Patient also complains of back pain. Patient denies fever, stomach ache and vaginal discharge.  Patient does have a history of recurrent UTI.  Patient does not have a history of pyelonephritis.  She also gets recurrent yeast infections, wants to discuss management, also wants prescription for Diflucan because she always gets a yeast infection after antibiotic therapy.  The following portions of the patient's history were reviewed and updated as appropriate: allergies, current medications, past family history, past medical history, past social history, past surgical history and problem list.  Review of Systems Pertinent items are noted in HPI.    Objective:    BP 116/78  Pulse 86  Ht 5\' 6"  (1.676 m)  Wt 162 lb 8 oz (73.71 kg)  BMI 26.23 kg/m2 General: alert and no distress  Abdomen: soft, non-tender, without masses or organomegaly tender in suprapubic area  Back: CVA tenderness absent  GU: defer exam   Laboratory:  Urine dipstick shows 1+ LE, large blood, no nitrites. Urine culture sent.    Assessment and Plan:    Acute cystitis and recurrent candida vaginitis  1. Medications: e-prescribed Ciprofloxacin and Urispas for UTI, follow up urine cultures 2. Emphasized good vulvar hygiene, eating yogurt with active lactobacillus cultures, trial of boric acid capsule to maintain proper pH in the vagina.  If this continues, patient may need prolonged course of Diflucan and also to rule out DM or other reason for recurrent candidal infections. Diflucan and Boric acid e-prescribed. 3. Maintain adequate hydration 4. Follow up if symptoms not improving, and as needed for any GYN concerns.

## 2011-09-10 NOTE — Patient Instructions (Addendum)
Return to clinic for any scheduled appointments or for any gynecologic concerns as needed.   

## 2011-09-10 NOTE — Progress Notes (Signed)
Frequent urination, some discomfort, no burning

## 2011-09-12 LAB — URINE CULTURE: Colony Count: 2000

## 2011-10-29 ENCOUNTER — Telehealth: Payer: Self-pay | Admitting: *Deleted

## 2011-10-29 DIAGNOSIS — B379 Candidiasis, unspecified: Secondary | ICD-10-CM

## 2011-10-29 MED ORDER — FLUCONAZOLE 150 MG PO TABS: 150.0000 mg | ORAL_TABLET | Freq: Once | ORAL | Status: AC

## 2011-10-29 NOTE — Telephone Encounter (Signed)
Patient took Diflucan at last visit and she stated her discharge was better but it never went completely away.  She would like a refill.

## 2012-01-07 ENCOUNTER — Encounter: Payer: Self-pay | Admitting: Obstetrics & Gynecology

## 2012-01-07 ENCOUNTER — Ambulatory Visit (INDEPENDENT_AMBULATORY_CARE_PROVIDER_SITE_OTHER): Payer: BC Managed Care – PPO | Admitting: Obstetrics & Gynecology

## 2012-01-07 VITALS — BP 122/82 | HR 81 | Ht 66.0 in | Wt 164.0 lb

## 2012-01-07 DIAGNOSIS — R1031 Right lower quadrant pain: Secondary | ICD-10-CM

## 2012-01-07 LAB — POCT URINALYSIS DIPSTICK
Blood, UA: NEGATIVE
Nitrite, UA: NEGATIVE
Urobilinogen, UA: 0.2
pH, UA: 5

## 2012-01-07 NOTE — Progress Notes (Signed)
  Subjective:    Patient ID: ITATY STRENGTH, female    DOB: August 17, 1965, 46 y.o.   MRN: [id number]  HPI 46 yo AA lady with a 1 week history of intermittent, sometimes daily, RUQ pain, lasts about 2 minutes, no palliation or provocation. No recent intercourse. Feels like gallbladder pain, but she had her GB removed in the past.   Review of Systems     Objective:   Physical Exam  No CVAT UA normal Pelvic exam normal, NT, no masses      Assessment & Plan:   RUQ pain-Reassurance given. I have offered her an u/s to be scheduled at her convenience. She will call if she wants it scheduled. Flu vaccine declined.

## 2012-02-06 ENCOUNTER — Encounter: Payer: Self-pay | Admitting: Family Medicine

## 2012-02-06 ENCOUNTER — Ambulatory Visit (INDEPENDENT_AMBULATORY_CARE_PROVIDER_SITE_OTHER): Payer: BC Managed Care – PPO | Admitting: Family Medicine

## 2012-02-06 VITALS — BP 132/79 | HR 74 | Ht 66.0 in | Wt 170.0 lb

## 2012-02-06 DIAGNOSIS — Z7251 High risk heterosexual behavior: Secondary | ICD-10-CM

## 2012-02-06 NOTE — Assessment & Plan Note (Signed)
Full STD testing today.

## 2012-02-06 NOTE — Progress Notes (Signed)
  Subjective:    Patient ID: Ashley [name], female    DOB: 1965-08-14, 46 y.o.   MRN: [id number]  HPI  Here for STD check.  Has new partner.  Would like full testing.  No sx's at present.  No recent yeast infxn.  Review of Systems  Constitutional: Negative for fever and chills.  Gastrointestinal: Negative for nausea and vomiting.       Objective:   Physical Exam  Constitutional: She appears well-developed and well-nourished.  HENT:  Head: Normocephalic and atraumatic.  Neck: Neck supple.  Cardiovascular: Normal rate and regular rhythm.   Pulmonary/Chest: Effort normal.  Abdominal: Soft. There is no tenderness.  Genitourinary: Vagina normal. No vaginal discharge found.       Uterus and cervix absent.          Assessment & Plan:

## 2012-02-06 NOTE — Patient Instructions (Signed)
Safer Sex  Your caregiver wants you to have this information about the infections that can be transmitted from sexual contact and how to prevent them. The idea behind safer sex is that you can be sexually active, and at the same time reduce the risk of giving or getting a sexually transmitted disease (STD). Every person should be aware of how to prevent him or herself and his or her sex partner from getting an STD.  CAUSES OF STDS  STDs are transmitted by sharing body fluids, which contain viruses and bacteria. The following fluids all transmit infections during sexual intercourse and sex acts:  · Semen.  · Saliva.  · Urine.  · Blood.  · Vaginal mucus.  Examples of STDs include:  · Chlamydia.  · Gonorrhea.  · Genital herpes.  · Hepatitis B.  · Human immunodeficiency virus or acquired immunodeficiency syndrome (HIV or AIDS).  · Syphilis.  · Trichomonas.  · Pubic lice.  · Human papillomavirus (HPV), which may include:  · Genital warts.  · Cervical dysplasia.  · Cervical cancer (can develop with certain types of HPV).  SYMPTOMS   Sexual diseases often cause few or no symptoms until they are advanced, so a person can be infected and spread the infection without knowing it. Some STDs respond to treatment very well. Others, like HIV and herpes, cannot be cured, but are treated to reduce their effects.  Specific symptoms include:  · Abnormal vaginal discharge.  · Irritation or itching in and around the vagina, and in the pubic hair.  · Pain during sexual intercourse.  · Bleeding during sexual intercourse.  · Pelvic or abdominal pain.  · Fever.  · Growths in and around the vagina.  · An ulcer in or around the vagina.  · Swollen glands in the groin area.  DIAGNOSIS   · Blood tests.  · Pap test.  · Culture test of abnormal vaginal discharge.  · A test that applies a solution and examines the cervix with a lighted magnifying scope (colposcopy).  · A test that examines the pelvis with a lighted tube, through a small incision  (laparoscopy).  TREATMENT   The treatment will depend on the cause of the STD.  · Antibiotic treatment by injection, oral, creams, or suppositories in the vagina.  · Over-the-counter medicated shampoo, to get rid of pubic lice.  · Removing or treating growths with medicine, freezing, burning (electrocautery), or surgery.  · Surgery treatment for HPV of the cervix.  · Supportive medicines for herpes, HIV, AIDS, and hepatitis.  Being careful cannot eliminate all risk of infection, but sex can be made much safer.  Safe sexual practices include body massage and gentle touching. Masturbation is safe, as long as body fluids do not contact skin that has sores or cuts. Dry kissing and oral sex on a man wearing a latex condom or on a woman wearing a female condom is also safe. Slightly less safe is intercourse while the man wears a latex condom or wet kissing. It is also safer to have one sex partner that you know is not having sex with anyone else.  LENGTH OF ILLNESS  An STD might be treated and cured in a week, sometimes a month, or more. And it can linger with symptoms for many years. STDs can also cause damage to the female organs. This can cause chronic pain, infertility, and recurrence of the STD, especially herpes, hepatitis, HIV, and HPV.  HOME CARE INSTRUCTIONS AND PREVENTION  · Alcohol   and recreational drugs are often the reason given for not practicing safer sex. These substances affect your judgment. Alcohol and recreational drugs can also impair your immune system, making you more vulnerable to disease.  · Do not engage in risky and dangerous sexual practices, including:  · Vaginal or anal sex without a condom.  · Oral sex on a man without a condom.  · Oral sex on a woman without a female condom.  · Using saliva to lubricate a condom.  · Any other sexual contact in which body fluids or blood from one partner contact the other partner.  · You should use only latex condoms for men and water soluble lubricants.  Petroleum based lubricants or oils used to lubricate a condom will weaken the condom and increase the chance that it will break.  · Think very carefully before having sex with anyone who is high risk for STDs and HIV. This includes IV drug users, people with multiple sexual partners, or people who have had an STD, or a positive hepatitis or HIV blood test.  · Remember that even if your partner has had only one previous partner, their previous partner might have had multiple partners. If so, you are at high risk of being exposed to an STD. You and your sex partner should be the only sex partners with each other, with no one else involved.  · A vaccine is available for hepatitis B and HPV through your caregiver or the Public Health Department. Everyone should be vaccinated with these vaccines.  · Avoid risky sex practices. Sex acts that can break the skin make you more likely to get an STD.  SEEK MEDICAL CARE IF:   · If you think you have an STD, even if you do not have any symptoms. Contact your caregiver for evaluation and treatment, if needed.  · You think or know your sex partner has acquired an STD.  · You have any of the symptoms mentioned above.  Document Released: 03/21/2004 Document Revised: 05/06/2011 Document Reviewed: 01/11/2009  [employer]® Patient Information ©2013 [employer].

## 2012-02-07 LAB — HEPATITIS B SURFACE ANTIGEN: Hepatitis B Surface Ag: NEGATIVE

## 2012-02-07 LAB — GC/CHLAMYDIA PROBE AMP, GENITAL
Chlamydia, DNA Probe: POSITIVE — AB
GC Probe Amp, Genital: NEGATIVE

## 2012-02-07 LAB — HEPATITIS C ANTIBODY: HCV Ab: NEGATIVE

## 2012-02-07 NOTE — Progress Notes (Signed)
Patient aware of + result

## 2012-02-11 NOTE — Progress Notes (Signed)
Call patient regarding labs result. Message left on patient voicemail to call office for her lab result.

## 2012-02-17 ENCOUNTER — Other Ambulatory Visit: Payer: Self-pay | Admitting: Gynecology

## 2012-02-17 ENCOUNTER — Telehealth: Payer: Self-pay | Admitting: Gynecology

## 2012-02-17 DIAGNOSIS — A749 Chlamydial infection, unspecified: Secondary | ICD-10-CM

## 2012-02-17 MED ORDER — DOXYCYCLINE HYCLATE 100 MG PO TABS: 100.0000 mg | ORAL_TABLET | Freq: Two times a day (BID) | ORAL | Status: DC

## 2012-02-17 NOTE — Telephone Encounter (Signed)
Call patient again today. Spoke to patient regarding her lab result for GC/CHL. Patient is aware that prescription for doxy. 100mg  po BID x 7 day will send to her pharmacy at [facility], in [place]. Patient also aware that her partner needed to be treated.

## 2012-03-16 ENCOUNTER — Ambulatory Visit: Payer: BC Managed Care – PPO | Admitting: Obstetrics and Gynecology

## 2012-07-08 ENCOUNTER — Other Ambulatory Visit: Payer: Self-pay

## 2012-07-08 DIAGNOSIS — Z1231 Encounter for screening mammogram for malignant neoplasm of breast: Secondary | ICD-10-CM

## 2012-07-24 ENCOUNTER — Encounter: Payer: Self-pay | Admitting: Obstetrics & Gynecology

## 2012-07-24 ENCOUNTER — Ambulatory Visit (INDEPENDENT_AMBULATORY_CARE_PROVIDER_SITE_OTHER): Payer: BC Managed Care – PPO | Admitting: Obstetrics & Gynecology

## 2012-07-24 VITALS — BP 123/88 | HR 81 | Resp 16 | Ht 66.0 in | Wt 163.0 lb

## 2012-07-24 DIAGNOSIS — M2550 Pain in unspecified joint: Secondary | ICD-10-CM

## 2012-07-24 DIAGNOSIS — Z01419 Encounter for gynecological examination (general) (routine) without abnormal findings: Secondary | ICD-10-CM

## 2012-07-24 DIAGNOSIS — N898 Other specified noninflammatory disorders of vagina: Secondary | ICD-10-CM

## 2012-07-24 DIAGNOSIS — Z0000 Encounter for general adult medical examination without abnormal findings: Secondary | ICD-10-CM

## 2012-07-24 DIAGNOSIS — A749 Chlamydial infection, unspecified: Secondary | ICD-10-CM

## 2012-07-24 NOTE — Patient Instructions (Signed)
Thank you for enrolling in MyChart. Please follow the instructions below to securely access your online medical record. MyChart allows you to send messages to your doctor, view your test results, renew your prescriptions, schedule appointments, and more.  How Do I Sign Up? 1. In your Internet browser, go to [email or web address] 2. Click on the New  User? link in the Sign In box.  3. Enter your MyChart Access Code exactly as it appears below. You will not need to use this code after you have completed the sign-up process. If you do not sign up before the expiration date, you must request a new code. MyChart Access Code: [email or web address] Expires: 08/23/2012 10:33 AM  4. Enter the last four digits of your Social Security Number (xxxx) and Date of Birth (mm/dd/yyyy) as indicated and click Next. You will be taken to the next sign-up page. 5. Create a MyChart ID. This will be your MyChart login ID and cannot be changed, so think of one that is secure and easy to remember. 6. Create a MyChart password. You can change your password at any time. 7. Enter your Password Reset Question and Answer and click Next. This can be used at a later time if you forget your password.  8. Select your communication preference, and if applicable enter your e-mail address. You will receive e-mail notification when new information is available in MyChart by choosing to receive e-mail notifications and filling in your e-mail. 9. Click Sign In. You can now view your medical record.   Additional Information If you have questions, you can email [email or web address] or call [phone] to talk to our MyChart staff. Remember, MyChart is NOT to be used for urgent needs. For medical emergencies, dial 911.

## 2012-07-24 NOTE — Progress Notes (Signed)
Subjective:    Ashley [name] is a 47 y.o. female who presents for an annual exam and screening labs. She complains of both hands hurting mainly in her fingers for a few weeks. She would like a RF.The patient is sexually active. GYN screening history: last pap: was normal. The patient wears seatbelts: yes. The patient participates in regular exercise: yes. Has the patient ever been transfused or tattooed?: yes. The patient reports that there is not domestic violence in her life.   Menstrual History: OB History   Grav Para Term Preterm Abortions TAB SAB Ect Mult Living   5 3        2       Menarche age: 41  No LMP recorded. Patient has had a hysterectomy.    The following portions of the patient's history were reviewed and updated as appropriate: allergies, current medications, past family history, past medical history, past social history, past surgical history and problem list.  Review of Systems A comprehensive review of systems was negative. She is monogamous for 7 months, denies dyspareunia. She wants TOC for + chlamydia. 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   Objective:    BP 123/88  Pulse 81  Resp 16  Ht 5\' 6"  (1.676 m)  Wt 163 lb (73.936 kg)  BMI 26.32 kg/m2  General Appearance:    Alert, cooperative, no distress, appears stated age  Head:    Normocephalic, without obvious abnormality, atraumatic  Eyes:    PERRL, conjunctiva/corneas clear, EOM's intact, fundi    benign, both eyes  Ears:    Normal TM's and external ear canals, both ears  Nose:   Nares normal, septum midline, mucosa normal, no drainage    or sinus tenderness  Throat:   Lips, mucosa, and tongue normal; teeth and gums normal  Neck:   Supple, symmetrical, trachea midline, no adenopathy;    thyroid:  no enlargement/tenderness/nodules; no carotid   bruit or JVD  Back:     Symmetric, no curvature, ROM normal, no CVA tenderness  Lungs:     Clear to auscultation bilaterally, respirations unlabored  Chest  Wall:    No tenderness or deformity   Heart:    Regular rate and rhythm, S1 and S2 normal, no murmur, rub   or gallop  Breast Exam:    No tenderness, masses, or nipple abnormality  Abdomen:     Soft, non-tender, bowel sounds active all four quadrants,    no masses, no organomegaly, There is an AK versus mole that she would like removed  Genitalia:    Normal female without lesion, discharge or tenderness, white vaginal discharge (annoying to Ashley [name]), normal bimanual exam     Extremities:   Extremities normal, atraumatic, no cyanosis or edema  Pulses:   2+ and symmetric all extremities  Skin:   Skin color, texture, turgor normal, no rashes or lesions  Lymph nodes:   Cervical, supraclavicular, and axillary nodes normal  Neurologic:   CNII-XII intact, normal strength, sensation and reflexes    throughout  .    Assessment:    Healthy female exam.   symptomatic normal appearing vaginal discharge Plan:     Mammogram.  scheduled for next month Mole removal at her convenience Fasting labs today, including TSH (NOT on meds after having her thyroid reportedly removed) Wet prep sent

## 2012-07-25 LAB — COMPREHENSIVE METABOLIC PANEL
ALT: 17 U/L (ref 0–35)
CO2: 31 mEq/L (ref 19–32)
Sodium: 140 mEq/L (ref 135–145)
Total Bilirubin: 0.5 mg/dL (ref 0.3–1.2)
Total Protein: 6.8 g/dL (ref 6.0–8.3)

## 2012-07-25 LAB — WET PREP, GENITAL: Yeast Wet Prep HPF POC: NONE SEEN

## 2012-07-25 LAB — CBC
MCHC: 33.1 g/dL (ref 30.0–36.0)
RDW: 14.5 % (ref 11.5–15.5)

## 2012-07-25 LAB — LIPID PANEL
Cholesterol: 192 mg/dL (ref 0–200)
LDL Cholesterol: 114 mg/dL — ABNORMAL HIGH (ref 0–99)
VLDL: 11 mg/dL (ref 0–40)

## 2012-07-25 LAB — RHEUMATOID FACTOR: Rhuematoid fact SerPl-aCnc: <10 IU/mL (ref <=14)

## 2012-07-31 ENCOUNTER — Telehealth: Payer: Self-pay | Admitting: *Deleted

## 2012-07-31 ENCOUNTER — Other Ambulatory Visit: Payer: Self-pay | Admitting: *Deleted

## 2012-07-31 DIAGNOSIS — N898 Other specified noninflammatory disorders of vagina: Secondary | ICD-10-CM

## 2012-07-31 MED ORDER — METRONIDAZOLE 500 MG PO TABS: 500.0000 mg | ORAL_TABLET | Freq: Two times a day (BID) | ORAL | Status: DC

## 2012-07-31 MED ORDER — FLUCONAZOLE 150 MG PO TABS: 150.0000 mg | ORAL_TABLET | Freq: Once | ORAL | Status: DC

## 2012-07-31 NOTE — Telephone Encounter (Signed)
Called pt to adv yeast infection and meds have been sent to [facility] on [address] in [place]. LMOM that if pt had any other questions to feel free to call back - otherwise, meds at pharm.

## 2012-08-17 ENCOUNTER — Ambulatory Visit: Payer: BC Managed Care – PPO

## 2012-08-17 ENCOUNTER — Ambulatory Visit
Admission: RE | Admit: 2012-08-17 | Discharge: 2012-08-17 | Disposition: A | Discharge status: Home / Self Care | Payer: BC Managed Care – PPO | Source: Ambulatory Visit

## 2012-08-17 DIAGNOSIS — Z1231 Encounter for screening mammogram for malignant neoplasm of breast: Secondary | ICD-10-CM

## 2012-09-03 ENCOUNTER — Telehealth: Payer: Self-pay

## 2012-09-03 NOTE — Telephone Encounter (Signed)
PATIENT CALLED NEEDING SOMETHING CALLED IN FOR A YEAST INFECTION. SHE HAS HAD THIS BEFORE, I CALLED IN DYFLUCAN 150MG  TO HER PHARMACY #2 0 REFILL[facility]. PATIENT ADVISED TO RETURN TO OFFICE IF THIS OCCURS AGAIN FOR EVALUATION. PLEASE ADENDUM THIS THANKS!

## 2012-12-24 ENCOUNTER — Ambulatory Visit: Payer: BC Managed Care – PPO | Admitting: Obstetrics and Gynecology

## 2012-12-25 ENCOUNTER — Encounter: Payer: Self-pay | Admitting: Obstetrics and Gynecology

## 2012-12-25 ENCOUNTER — Ambulatory Visit (INDEPENDENT_AMBULATORY_CARE_PROVIDER_SITE_OTHER): Payer: BC Managed Care – PPO | Admitting: Obstetrics and Gynecology

## 2012-12-25 VITALS — BP 123/82 | HR 73 | Ht 66.0 in | Wt 168.0 lb

## 2012-12-25 DIAGNOSIS — R109 Unspecified abdominal pain: Secondary | ICD-10-CM

## 2012-12-25 DIAGNOSIS — R350 Frequency of micturition: Secondary | ICD-10-CM

## 2012-12-25 DIAGNOSIS — N760 Acute vaginitis: Secondary | ICD-10-CM

## 2012-12-25 LAB — POCT URINALYSIS DIPSTICK
Glucose, UA: NEGATIVE
Leukocytes, UA: NEGATIVE
Nitrite, UA: NEGATIVE
Protein, UA: NEGATIVE
Spec Grav, UA: >=1.03
Urobilinogen, UA: NEGATIVE
pH, UA: 6

## 2012-12-25 NOTE — Progress Notes (Signed)
Patient ID: TISHARA PIZANO, female   DOB: Jul 02, 1965, 47 y.o.   MRN: [id number] 47 yo G5P3 s/p hysterectomy in 2006 secondary to fibroid uterus presenting today with a 2-week h/o of right sided abdominal pain. Patient reports the pain is present almost every day but is not Erin Obando. It seems to appear following meals but patient is not certain. There are no alleviating factors as the pain seems to resolve spontaneously. Patient also reports some vaginal discharge with irritation for the past few days. She has a new sexual partner and desires chlamydia/gonorrhea testing. Along with the discharge she reports some dysuria and urinary frequency  GENERAL: Well-developed, well-nourished female in no acute distress.  ABDOMEN: Soft, mild RUQ tenderness, nondistended, no rebound, no guarding. PELVIC: Normal external female genitalia. Vagina is pink and rugated.  Milky vaginal discharge.  No adnexal mass or tenderness. EXTREMITIES: No cyanosis, clubbing, or edema, 2+ distal pulses.  A/P 47 yo with RUQ pain and vaginitis - Urine culture ordered - wet prep and GC/Chl cultures collected - Pelvic ultrasound ordered. If normal, may need to refer to PCP - Patient will be contacted with any abnormal results

## 2012-12-25 NOTE — Progress Notes (Signed)
Pelvic pain for about two weeks on the right side.  The pain in constant but it gets worse and different points during the day.  No burning or painful urination.  She does have increased frequency in urination.  She is also having increased vaginal irritation.

## 2012-12-26 ENCOUNTER — Other Ambulatory Visit: Payer: Self-pay | Admitting: Obstetrics and Gynecology

## 2012-12-26 LAB — GC/CHLAMYDIA PROBE AMP: CT Probe RNA: NEGATIVE

## 2012-12-26 LAB — WET PREP, GENITAL: Yeast Wet Prep HPF POC: NONE SEEN

## 2012-12-26 MED ORDER — METRONIDAZOLE 500 MG PO TABS: 500.0000 mg | ORAL_TABLET | Freq: Two times a day (BID) | ORAL | Status: DC

## 2012-12-28 ENCOUNTER — Telehealth: Payer: Self-pay | Admitting: *Deleted

## 2012-12-28 NOTE — Telephone Encounter (Signed)
Message copied by Grayland Ormond on Mon Dec 28, 2012  8:12 AM ------      Message from: Ashley [name]      Created: Sat Dec 26, 2012 12:25 PM       Please inform patient of positive BV. Flagyl has been e-prescribed ------

## 2012-12-28 NOTE — Telephone Encounter (Signed)
Pt aware of test results and will start Flagyl.

## 2012-12-31 ENCOUNTER — Ambulatory Visit (HOSPITAL_COMMUNITY)
Admission: RE | Admit: 2012-12-31 | Discharge: 2012-12-31 | Disposition: A | Discharge status: Home / Self Care | Payer: BC Managed Care – PPO | Source: Ambulatory Visit | Attending: Obstetrics and Gynecology | Admitting: Obstetrics and Gynecology

## 2012-12-31 ENCOUNTER — Other Ambulatory Visit: Payer: Self-pay

## 2012-12-31 DIAGNOSIS — R1031 Right lower quadrant pain: Secondary | ICD-10-CM | POA: Insufficient documentation

## 2012-12-31 DIAGNOSIS — R350 Frequency of micturition: Secondary | ICD-10-CM

## 2012-12-31 DIAGNOSIS — Z90710 Acquired absence of both cervix and uterus: Secondary | ICD-10-CM | POA: Insufficient documentation

## 2012-12-31 DIAGNOSIS — N7013 Chronic salpingitis and oophoritis: Secondary | ICD-10-CM | POA: Insufficient documentation

## 2013-01-08 ENCOUNTER — Telehealth: Payer: Self-pay | Admitting: *Deleted

## 2013-01-08 NOTE — Telephone Encounter (Signed)
Message copied by Barbara Cower on Fri Jan 08, 2013 11:49 AM ------      Message from: CONSTANT, Gigi Gin      Created: Wed Jan 06, 2013  8:50 AM       Please inform patient of normal pelvic ultrasound without any cysts on her ovaries. She still has a slightly dilated tube which is unchanged since 2011 and is a common finding after hysterectomy. No intervention is needed for it at this time.            Patient should follow up with PCP for further evaluation of her upper abdominal pain if persistent            Peggy ------

## 2013-01-08 NOTE — Telephone Encounter (Signed)
Message copied by Grayland Ormond on Fri Jan 08, 2013 10:09 AM ------      Message from: CONSTANT, PEGGY      Created: Wed Jan 06, 2013  8:50 AM       Please inform patient of normal pelvic ultrasound without any cysts on her ovaries. She still has a slightly dilated tube which is unchanged since 2011 and is a common finding after hysterectomy. No intervention is needed for it at this time.            Patient should follow up with PCP for further evaluation of her upper abdominal pain if persistent            Peggy ------

## 2013-01-08 NOTE — Telephone Encounter (Signed)
Pt aware.

## 2013-01-08 NOTE — Telephone Encounter (Signed)
Spoke to patient and she had already spoken to Chrissy to receive her test results.

## 2013-03-31 ENCOUNTER — Ambulatory Visit: Payer: BC Managed Care – PPO | Admitting: Family Medicine

## 2013-04-05 ENCOUNTER — Ambulatory Visit (INDEPENDENT_AMBULATORY_CARE_PROVIDER_SITE_OTHER): Payer: BC Managed Care – PPO | Admitting: Obstetrics & Gynecology

## 2013-04-05 ENCOUNTER — Encounter: Payer: Self-pay | Admitting: Obstetrics & Gynecology

## 2013-04-05 VITALS — BP 117/86 | HR 91 | Ht 66.0 in | Wt 165.0 lb

## 2013-04-05 DIAGNOSIS — B373 Candidiasis of vulva and vagina: Secondary | ICD-10-CM

## 2013-04-05 DIAGNOSIS — A499 Bacterial infection, unspecified: Secondary | ICD-10-CM

## 2013-04-05 DIAGNOSIS — N760 Acute vaginitis: Secondary | ICD-10-CM

## 2013-04-05 DIAGNOSIS — L293 Anogenital pruritus, unspecified: Secondary | ICD-10-CM

## 2013-04-05 DIAGNOSIS — B3731 Acute candidiasis of vulva and vagina: Secondary | ICD-10-CM

## 2013-04-05 DIAGNOSIS — N898 Other specified noninflammatory disorders of vagina: Secondary | ICD-10-CM

## 2013-04-05 DIAGNOSIS — B9689 Other specified bacterial agents as the cause of diseases classified elsewhere: Secondary | ICD-10-CM

## 2013-04-05 MED ORDER — FLUCONAZOLE 150 MG PO TABS: 150.0000 mg | ORAL_TABLET | ORAL | Status: DC

## 2013-04-05 NOTE — Addendum Note (Signed)
Addended by: Lin Landsman C on: 04/05/2013 04:21 PM   Modules accepted: Orders

## 2013-04-05 NOTE — Progress Notes (Signed)
   CLINIC ENCOUNTER NOTE  History:  48 y.o. G5P3 here today for vaginal discharge and vaginal itching x 2 weeks.  Also irritation during intercourse. No odor.  The following portions of the patient's history were reviewed and updated as appropriate: allergies, current medications, past family history, past medical history, past social history, past surgical history and problem list. Patient had hysterectomy for fibroids in 2006. Normal mammogram in 08/17/12.  Review of Systems:  Pertinent items are noted in HPI.  Objective:  Physical Exam BP 117/86  Pulse 91  Ht 5\' 6"  (1.676 m)  Wt 165 lb (74.844 kg)  BMI 26.64 kg/m2 Gen: NAD Abd: Soft, nontender and nondistended Pelvic: Normal appearing external genitalia; normal appearing vaginal mucosa and vaginal cuff.  Copious amount (about 3 ounces) of curd-like yellow discharge, sample taken for wet prep.  Assessment & Plan:  Wet prep done, will follow up results and manage accordingly. Diflucan presumptively prescribed.  Proper vulvar hygiene emphasized: discussed avoidance of perfumed soaps, detergents, lotions and any type of douches; in addition to wearing cotton underwear and no underwear at night.      Verita Schneiders, MD, [facility] [place], [place]

## 2013-04-05 NOTE — Patient Instructions (Signed)
Monilial Vaginitis  Vaginitis in a soreness, swelling and redness (inflammation) of the vagina and vulva. Monilial vaginitis is not a sexually transmitted infection.  CAUSES   Yeast vaginitis is caused by yeast (candida) that is normally found in your vagina. With a yeast infection, the candida has overgrown in number to a point that upsets the chemical balance.  SYMPTOMS   · White, thick vaginal discharge.  · Swelling, itching, redness and irritation of the vagina and possibly the lips of the vagina (vulva).  · Burning or painful urination.  · Painful intercourse.  DIAGNOSIS   Things that may contribute to monilial vaginitis are:  · Postmenopausal and virginal states.  · Pregnancy.  · Infections.  · Being tired, sick or stressed, especially if you had monilial vaginitis in the past.  · Diabetes. Good control will help lower the chance.  · Birth control pills.  · Tight fitting garments.  · Using bubble bath, feminine sprays, douches or deodorant tampons.  · Taking certain medications that kill germs (antibiotics).  · Sporadic recurrence can occur if you become ill.  TREATMENT   Your caregiver will give you medication.  · There are several kinds of anti monilial vaginal creams and suppositories specific for monilial vaginitis. For recurrent yeast infections, use a suppository or cream in the vagina 2 times a week, or as directed.  · Anti-monilial or steroid cream for the itching or irritation of the vulva may also be used. Get your caregiver's permission.  · Painting the vagina with methylene blue solution may help if the monilial cream does not work.  · Eating yogurt may help prevent monilial vaginitis.  HOME CARE INSTRUCTIONS   · Finish all medication as prescribed.  · Do not have sex until treatment is completed or after your caregiver tells you it is okay.  · Take warm sitz baths.  · Do not douche.  · Do not use tampons, especially scented ones.  · Wear cotton underwear.  · Avoid tight pants and panty  hose.  · Tell your sexual partner that you have a yeast infection. They should go to their caregiver if they have symptoms such as mild rash or itching.  · Your sexual partner should be treated as well if your infection is difficult to eliminate.  · Practice safer sex. Use condoms.  · Some vaginal medications cause latex condoms to fail. Vaginal medications that harm condoms are:  · Cleocin cream.  · Butoconazole (Femstat®).  · Terconazole (Terazol®) vaginal suppository.  · Miconazole (Monistat®) (may be purchased over the counter).  SEEK MEDICAL CARE IF:   · You have a temperature by mouth above 102° F (38.9° C).  · The infection is getting worse after 2 days of treatment.  · The infection is not getting better after 3 days of treatment.  · You develop blisters in or around your vagina.  · You develop vaginal bleeding, and it is not your menstrual period.  · You have pain when you urinate.  · You develop intestinal problems.  · You have pain with sexual intercourse.  Document Released: 11/21/2004 Document Revised: 05/06/2011 Document Reviewed: 08/05/2008  ExitCare® Patient Information ©2014 [employer].

## 2013-04-06 LAB — WET PREP, GENITAL
TRICH WET PREP: NONE SEEN
Yeast Wet Prep HPF POC: NONE SEEN

## 2013-04-08 MED ORDER — METRONIDAZOLE 500 MG PO TABS: 500.0000 mg | ORAL_TABLET | Freq: Two times a day (BID) | ORAL | Status: AC

## 2013-04-08 NOTE — Addendum Note (Signed)
Addended by: Verita Schneiders A on: 04/08/2013 07:56 AM   Modules accepted: Orders

## 2013-08-10 ENCOUNTER — Other Ambulatory Visit: Payer: Self-pay

## 2013-08-10 DIAGNOSIS — Z1231 Encounter for screening mammogram for malignant neoplasm of breast: Secondary | ICD-10-CM

## 2013-08-18 ENCOUNTER — Ambulatory Visit
Admission: RE | Admit: 2013-08-18 | Discharge: 2013-08-18 | Disposition: A | Discharge status: Home / Self Care | Payer: BC Managed Care – PPO | Source: Ambulatory Visit

## 2013-08-18 DIAGNOSIS — Z1231 Encounter for screening mammogram for malignant neoplasm of breast: Secondary | ICD-10-CM

## 2013-08-19 ENCOUNTER — Other Ambulatory Visit: Payer: Self-pay | Admitting: Obstetrics & Gynecology

## 2013-08-19 DIAGNOSIS — R928 Other abnormal and inconclusive findings on diagnostic imaging of breast: Secondary | ICD-10-CM

## 2013-09-01 ENCOUNTER — Other Ambulatory Visit: Payer: BC Managed Care – PPO

## 2013-09-03 ENCOUNTER — Ambulatory Visit
Admission: RE | Admit: 2013-09-03 | Discharge: 2013-09-03 | Disposition: A | Discharge status: Home / Self Care | Payer: BC Managed Care – PPO | Source: Ambulatory Visit | Attending: Obstetrics & Gynecology | Admitting: Obstetrics & Gynecology

## 2013-09-03 DIAGNOSIS — R928 Other abnormal and inconclusive findings on diagnostic imaging of breast: Secondary | ICD-10-CM

## 2013-09-07 ENCOUNTER — Encounter: Payer: Self-pay | Admitting: Obstetrics & Gynecology

## 2013-09-07 ENCOUNTER — Ambulatory Visit (INDEPENDENT_AMBULATORY_CARE_PROVIDER_SITE_OTHER): Payer: BC Managed Care – PPO | Admitting: Obstetrics & Gynecology

## 2013-09-07 VITALS — BP 118/86 | HR 73 | Ht 65.0 in | Wt 164.0 lb

## 2013-09-07 DIAGNOSIS — Z0000 Encounter for general adult medical examination without abnormal findings: Secondary | ICD-10-CM

## 2013-09-07 DIAGNOSIS — Z01419 Encounter for gynecological examination (general) (routine) without abnormal findings: Secondary | ICD-10-CM

## 2013-09-07 NOTE — Progress Notes (Signed)
Subjective:    Ashley [name] is a 48 y.o. female who presents for an annual exam. She reports weight flucuation and would like a TSH done along with her fasting labs.She has intermittent right flank pain for the last month. She has not used any meds. The patient is sexually active. GYN screening history: last pap: was normal. The patient wears seatbelts: yes. The patient participates in regular exercise: no. Has the patient ever been transfused or tattooed?: no. The patient reports that there is not domestic violence in her life.   Menstrual History: OB History   Grav Para Term Preterm Abortions TAB SAB Ect Mult Living   5 3        2       Menarche age: 64  No LMP recorded. Patient has had a hysterectomy.    The following portions of the patient's history were reviewed and updated as appropriate: allergies, current medications, past family history, past medical history, past social history, past surgical history and problem list.  Review of Systems Pertinent items are noted in HPI. She has been monogamous for about 2 years. Works at [employer]. Mammogram utd and normal.   Objective:    BP 118/86  Pulse 73  Ht 5\' 5"  (4.132 m)  Wt 164 lb (74.39 kg)  BMI 27.29 kg/m2  General Appearance:    Alert, cooperative, no distress, appears stated age  Head:    Normocephalic, without obvious abnormality, atraumatic  Eyes:    PERRL, conjunctiva/corneas clear, EOM's intact, fundi    benign, both eyes  Ears:    Normal TM's and external ear canals, both ears  Nose:   Nares normal, septum midline, mucosa normal, no drainage    or sinus tenderness  Throat:   Lips, mucosa, and tongue normal; teeth and gums normal  Neck:   Supple, symmetrical, trachea midline, no adenopathy;    thyroid:  no enlargement/tenderness/nodules; no carotid   bruit or JVD  Back:     Symmetric, no curvature, ROM normal, no CVA tenderness  Lungs:     Clear to auscultation bilaterally, respirations unlabored   Chest Wall:    No tenderness or deformity   Heart:    Regular rate and rhythm, S1 and S2 normal, no murmur, rub   or gallop  Breast Exam:    No tenderness, masses, or nipple abnormality  Abdomen:     Soft, non-tender, bowel sounds active all four quadrants,    no masses, no organomegaly  Genitalia:    Normal female without lesion, discharge or tenderness, white discharge, normal bimanual exam     Extremities:   Extremities normal, atraumatic, no cyanosis or edema  Pulses:   2+ and symmetric all extremities  Skin:   Skin color, texture, turgor normal, no rashes or lesions  Lymph nodes:   Cervical, supraclavicular, and axillary nodes normal  Neurologic:   CNII-XII intact, normal strength, sensation and reflexes    throughout  .    Assessment:    Healthy female exam.    Plan:     Breast self exam technique reviewed and patient encouraged to perform self-exam monthly. fasing labs  Wet prep sent

## 2013-09-08 LAB — COMPREHENSIVE METABOLIC PANEL
ALBUMIN: 4.1 g/dL (ref 3.5–5.2)
ALK PHOS: 65 U/L (ref 39–117)
ALT: 14 U/L (ref 0–35)
AST: 22 U/L (ref 0–37)
BUN: 8 mg/dL (ref 6–23)
CO2: 28 meq/L (ref 19–32)
Calcium: 9.7 mg/dL (ref 8.4–10.5)
Chloride: 104 mEq/L (ref 96–112)
Creat: 0.69 mg/dL (ref 0.50–1.10)
Glucose, Bld: 84 mg/dL (ref 70–99)
POTASSIUM: 5.6 meq/L — AB (ref 3.5–5.3)
SODIUM: 140 meq/L (ref 135–145)
TOTAL PROTEIN: 6.8 g/dL (ref 6.0–8.3)
Total Bilirubin: 0.7 mg/dL (ref 0.2–1.2)

## 2013-09-08 LAB — TSH: TSH: 2.825 u[IU]/mL (ref 0.350–4.500)

## 2013-09-08 LAB — LIPID PANEL
Cholesterol: 211 mg/dL — ABNORMAL HIGH (ref 0–200)
HDL: 61 mg/dL (ref >39)
LDL Cholesterol: 135 mg/dL — ABNORMAL HIGH (ref 0–99)
Total CHOL/HDL Ratio: 3.5 Ratio
Triglycerides: 74 mg/dL (ref <150)
VLDL: 15 mg/dL (ref 0–40)

## 2013-09-13 ENCOUNTER — Telehealth: Payer: Self-pay | Admitting: *Deleted

## 2013-09-13 NOTE — Telephone Encounter (Signed)
Notified patient of results and recommendations.  She will watch her diet and follow up next year for her yearly exam.

## 2013-09-13 NOTE — Telephone Encounter (Signed)
Message copied by Erik Obey on Mon Sep 13, 2013 11:55 AM ------      Message from: Clovia Cuff C      Created: Mon Sep 13, 2013  8:25 AM       Her lipids are increasing and she needs a letter recommending a low fat diet and we will recheck them in a year.      Thanks ------

## 2013-10-12 ENCOUNTER — Telehealth: Payer: Self-pay | Admitting: *Deleted

## 2013-10-12 NOTE — Telephone Encounter (Signed)
Pt needed Flagyl sent in to her pharmacy for BV.  This medication has been called in.

## 2013-10-13 ENCOUNTER — Ambulatory Visit (INDEPENDENT_AMBULATORY_CARE_PROVIDER_SITE_OTHER): Payer: BC Managed Care – PPO | Admitting: Obstetrics & Gynecology

## 2013-10-13 ENCOUNTER — Encounter: Payer: Self-pay | Admitting: Obstetrics & Gynecology

## 2013-10-13 VITALS — BP 128/79 | HR 76 | Ht 66.0 in | Wt 163.8 lb

## 2013-10-13 DIAGNOSIS — A499 Bacterial infection, unspecified: Secondary | ICD-10-CM

## 2013-10-13 DIAGNOSIS — B9689 Other specified bacterial agents as the cause of diseases classified elsewhere: Secondary | ICD-10-CM

## 2013-10-13 DIAGNOSIS — B373 Candidiasis of vulva and vagina: Secondary | ICD-10-CM

## 2013-10-13 DIAGNOSIS — R350 Frequency of micturition: Secondary | ICD-10-CM

## 2013-10-13 DIAGNOSIS — B3731 Acute candidiasis of vulva and vagina: Secondary | ICD-10-CM

## 2013-10-13 DIAGNOSIS — N760 Acute vaginitis: Secondary | ICD-10-CM

## 2013-10-13 LAB — POCT URINALYSIS DIPSTICK
Bilirubin, UA: NEGATIVE
Blood, UA: NEGATIVE
Glucose, UA: NEGATIVE
KETONES UA: NEGATIVE
Leukocytes, UA: NEGATIVE
Nitrite, UA: NEGATIVE
PH UA: 7
PROTEIN UA: NEGATIVE
SPEC GRAV UA: 1.01
Urobilinogen, UA: NEGATIVE

## 2013-10-13 MED ORDER — METRONIDAZOLE 500 MG PO TABS: 500.0000 mg | ORAL_TABLET | Freq: Two times a day (BID) | ORAL | Status: AC

## 2013-10-13 MED ORDER — FLUCONAZOLE 150 MG PO TABS: 150.0000 mg | ORAL_TABLET | Freq: Once | ORAL | Status: DC

## 2013-10-13 NOTE — Addendum Note (Signed)
Addended by: Erik Obey on: 10/13/2013 04:12 PM   Modules accepted: Orders

## 2013-10-13 NOTE — Progress Notes (Signed)
Subjective:     Patient ID: Ashley [name], female   DOB: 1965-10-01, 48 y.o.   MRN: [id number]  HPI Pt reports a 3 day h/o discharge with an odor. She just completed a 10 day course of Amox 2 days ago.  She reports a slight pain/irritation in the lower pelvis.   No dysuria.   Review of Systems     Objective:   Physical Exam BP 128/79  Pulse 76  Ht 5\' 6"  (1.676 m)  Wt 163 lb 12.8 oz (74.299 kg)  BMI 26.45 kg/m2 Pt in NAD Abd: soft, slightly tender.  ND GU: EGBUS: no lesions Vagina: no blood in vault; white discharge.  No clumping; no erythema  Adnexa: no masses; sl tender   UA: neg      Assessment:     BV with possible yeast vaginitis     Plan:     F/u wet [place] Diflucan 150mg  po q day Flagyl 500mg  bid x 7 days F/u prn

## 2013-10-13 NOTE — Patient Instructions (Signed)
Bacterial Vaginosis Bacterial vaginosis is a vaginal infection that occurs when the normal balance of bacteria in the vagina is disrupted. It results from an overgrowth of certain bacteria. This is the most common vaginal infection in women of childbearing age. Treatment is important to prevent complications, especially in pregnant women, as it can cause a premature delivery. CAUSES  Bacterial vaginosis is caused by an increase in harmful bacteria that are normally present in smaller amounts in the vagina. Several different kinds of bacteria can cause bacterial vaginosis. However, the reason that the condition develops is not fully understood. RISK FACTORS Certain activities or behaviors can put you at an increased risk of developing bacterial vaginosis, including:  Having a new sex partner or multiple sex partners.  Douching.  Using an intrauterine device (IUD) for contraception. Women do not get bacterial vaginosis from toilet seats, bedding, swimming pools, or contact with objects around them. SIGNS AND SYMPTOMS  Some women with bacterial vaginosis have no signs or symptoms. Common symptoms include:  Grey vaginal discharge.  A fishlike odor with discharge, especially after sexual intercourse.  Itching or burning of the vagina and vulva.  Burning or pain with urination. DIAGNOSIS  Your health care provider will take a medical history and examine the vagina for signs of bacterial vaginosis. A sample of vaginal fluid may be taken. Your health care provider will look at this sample under a microscope to check for bacteria and abnormal cells. A vaginal pH test may also be done.  TREATMENT  Bacterial vaginosis may be treated with antibiotic medicines. These may be given in the form of a pill or a vaginal cream. A second round of antibiotics may be prescribed if the condition comes back after treatment.  HOME CARE INSTRUCTIONS   Only take over-the-counter or prescription medicines as  directed by your health care provider.  If antibiotic medicine was prescribed, take it as directed. Make sure you finish it even if you start to feel better.  Do not have sex until treatment is completed.  Tell all sexual partners that you have a vaginal infection. They should see their health care provider and be treated if they have problems, such as a mild rash or itching.  Practice safe sex by using condoms and only having one sex partner. SEEK MEDICAL CARE IF:   Your symptoms are not improving after 3 days of treatment.  You have increased discharge or pain.  You have a fever. MAKE SURE YOU:   Understand these instructions.  Will watch your condition.  Will get help right away if you are not doing well or get worse. FOR MORE INFORMATION  Centers for Disease Control and Prevention, Division of STD Prevention: www.cdc.gov/std American Sexual Health Association (ASHA): www.ashastd.org  Document Released: 02/11/2005 Document Revised: 12/02/2012 Document Reviewed: 09/23/2012 ExitCare Patient Information 2015 [employer]. This information is not intended to replace advice given to you by your health care provider. Make sure you discuss any questions you have with your health care provider.  

## 2013-10-13 NOTE — Progress Notes (Signed)
Increased vaginal discharge with odor.

## 2013-10-14 LAB — WET PREP FOR TRICH, YEAST, CLUE
Trich, Wet Prep: NONE SEEN
WBC WET PREP: NONE SEEN
Yeast Wet Prep HPF POC: NONE SEEN

## 2013-12-10 ENCOUNTER — Other Ambulatory Visit: Payer: Self-pay

## 2013-12-26 ENCOUNTER — Emergency Department (HOSPITAL_COMMUNITY): Payer: Managed Care, Other (non HMO)

## 2013-12-26 ENCOUNTER — Emergency Department (INDEPENDENT_AMBULATORY_CARE_PROVIDER_SITE_OTHER)
Admission: EM | Admit: 2013-12-26 | Discharge: 2013-12-26 | Disposition: A | Discharge status: Other Acute Inpatient Hospital | Payer: Managed Care, Other (non HMO) | Source: Home / Self Care | Attending: Family Medicine | Admitting: Family Medicine

## 2013-12-26 ENCOUNTER — Encounter (HOSPITAL_COMMUNITY): Payer: Self-pay | Admitting: *Deleted

## 2013-12-26 ENCOUNTER — Emergency Department (HOSPITAL_COMMUNITY)
Admission: EM | Admit: 2013-12-26 | Discharge: 2013-12-27 | Disposition: A | Discharge status: Home / Self Care | Payer: Managed Care, Other (non HMO) | Attending: Emergency Medicine | Admitting: Emergency Medicine

## 2013-12-26 DIAGNOSIS — R1907 Generalized intra-abdominal and pelvic swelling, mass and lump: Secondary | ICD-10-CM | POA: Insufficient documentation

## 2013-12-26 DIAGNOSIS — Z79899 Other long term (current) drug therapy: Secondary | ICD-10-CM | POA: Diagnosis not present

## 2013-12-26 DIAGNOSIS — R1031 Right lower quadrant pain: Secondary | ICD-10-CM

## 2013-12-26 DIAGNOSIS — Z90710 Acquired absence of both cervix and uterus: Secondary | ICD-10-CM | POA: Insufficient documentation

## 2013-12-26 DIAGNOSIS — Z8709 Personal history of other diseases of the respiratory system: Secondary | ICD-10-CM | POA: Insufficient documentation

## 2013-12-26 DIAGNOSIS — Z9089 Acquired absence of other organs: Secondary | ICD-10-CM | POA: Diagnosis not present

## 2013-12-26 DIAGNOSIS — Z9889 Other specified postprocedural states: Secondary | ICD-10-CM | POA: Diagnosis not present

## 2013-12-26 DIAGNOSIS — Z8639 Personal history of other endocrine, nutritional and metabolic disease: Secondary | ICD-10-CM | POA: Insufficient documentation

## 2013-12-26 DIAGNOSIS — R1900 Intra-abdominal and pelvic swelling, mass and lump, unspecified site: Secondary | ICD-10-CM

## 2013-12-26 LAB — URINALYSIS, ROUTINE W REFLEX MICROSCOPIC
Bilirubin Urine: NEGATIVE
Glucose, UA: NEGATIVE mg/dL
Hgb urine dipstick: NEGATIVE
Ketones, ur: NEGATIVE mg/dL
NITRITE: NEGATIVE
PH: 5.5 (ref 5.0–8.0)
Protein, ur: NEGATIVE mg/dL
Specific Gravity, Urine: 1.02 (ref 1.005–1.030)
Urobilinogen, UA: 1 mg/dL (ref 0.0–1.0)

## 2013-12-26 LAB — CBC WITH DIFFERENTIAL/PLATELET
Basophils Absolute: 0 10*3/uL (ref 0.0–0.1)
Basophils Relative: 1 % (ref 0–1)
Eosinophils Absolute: 0 10*3/uL (ref 0.0–0.7)
Eosinophils Relative: 1 % (ref 0–5)
HCT: 40 % (ref 36.0–46.0)
HEMOGLOBIN: 13.4 g/dL (ref 12.0–15.0)
LYMPHS ABS: 1.3 10*3/uL (ref 0.7–4.0)
LYMPHS PCT: 34 % (ref 12–46)
MCH: 28.4 pg (ref 26.0–34.0)
MCHC: 33.5 g/dL (ref 30.0–36.0)
MCV: 84.7 fL (ref 78.0–100.0)
MONOS PCT: 7 % (ref 3–12)
Monocytes Absolute: 0.3 10*3/uL (ref 0.1–1.0)
NEUTROS ABS: 2.2 10*3/uL (ref 1.7–7.7)
NEUTROS PCT: 57 % (ref 43–77)
Platelets: 244 10*3/uL (ref 150–400)
RBC: 4.72 MIL/uL (ref 3.87–5.11)
RDW: 14 % (ref 11.5–15.5)
WBC: 3.9 10*3/uL — AB (ref 4.0–10.5)

## 2013-12-26 LAB — COMPREHENSIVE METABOLIC PANEL
ALBUMIN: 3.9 g/dL (ref 3.5–5.2)
ALK PHOS: 67 U/L (ref 39–117)
ALT: 7 U/L (ref 0–35)
AST: 18 U/L (ref 0–37)
Anion gap: 11 (ref 5–15)
BILIRUBIN TOTAL: 0.3 mg/dL (ref 0.3–1.2)
BUN: 8 mg/dL (ref 6–23)
CHLORIDE: 103 meq/L (ref 96–112)
CO2: 25 meq/L (ref 19–32)
Calcium: 9.5 mg/dL (ref 8.4–10.5)
Creatinine, Ser: 0.65 mg/dL (ref 0.50–1.10)
GFR calc Af Amer: >90 mL/min (ref >90)
GFR calc non Af Amer: >90 mL/min (ref >90)
Glucose, Bld: 105 mg/dL — ABNORMAL HIGH (ref 70–99)
POTASSIUM: 4.3 meq/L (ref 3.7–5.3)
Sodium: 139 mEq/L (ref 137–147)
Total Protein: 7.4 g/dL (ref 6.0–8.3)

## 2013-12-26 LAB — URINE MICROSCOPIC-ADD ON

## 2013-12-26 LAB — LIPASE, BLOOD: Lipase: 20 U/L (ref 11–59)

## 2013-12-26 MED ORDER — HYDROMORPHONE HCL 1 MG/ML IJ SOLN
1.0000 mg | Freq: Once | INTRAMUSCULAR | Status: AC
  Administered 2013-12-26: 1 mg via INTRAVENOUS
  Filled 2013-12-26: qty 1

## 2013-12-26 MED ORDER — ONDANSETRON HCL 4 MG/2ML IJ SOLN
4.0000 mg | Freq: Once | INTRAMUSCULAR | Status: AC
  Administered 2013-12-26: 4 mg via INTRAVENOUS
  Filled 2013-12-26: qty 2

## 2013-12-26 MED ORDER — IOHEXOL 300 MG/ML  SOLN
100.0000 mL | Freq: Once | INTRAMUSCULAR | Status: AC | PRN
  Administered 2013-12-26: 100 mL via INTRAVENOUS

## 2013-12-26 MED ORDER — IOHEXOL 300 MG/ML  SOLN
25.0000 mL | Freq: Once | INTRAMUSCULAR | Status: AC | PRN
  Administered 2013-12-26: 25 mL via ORAL

## 2013-12-26 MED ORDER — SODIUM CHLORIDE 0.9 % IV BOLUS (SEPSIS)
1000.0000 mL | INTRAVENOUS | Status: AC
  Administered 2013-12-26: 1000 mL via INTRAVENOUS

## 2013-12-26 NOTE — ED Notes (Signed)
Pt c/o abdominal pain and right flank pain since Friday. Pt denies n/v/d, temp 99.6 at home. Denies being around anybody sick.

## 2013-12-26 NOTE — ED Notes (Signed)
Last BM Wednesday and normal. Pt normally has BM every other day. Pt reports a burning sensation in her bladder when she urinates.

## 2013-12-26 NOTE — ED Notes (Signed)
Pt states that for 3 days she has had back pain and abd pain. Pt states [facility] and was told to come to the urgent care for further evaluation

## 2013-12-26 NOTE — ED Notes (Signed)
Pt given ice chips per Dr.harrison

## 2013-12-26 NOTE — ED Provider Notes (Addendum)
CSN: [id number]     Arrival date & time 12/26/13  1903 History   First MD Initiated Contact with Patient 12/26/13 2148     Chief Complaint  Patient presents with  . Abdominal Pain     (Consider location/radiation/quality/duration/timing/severity/associated sxs/prior Treatment) Patient is a 48 y.o. female presenting with abdominal pain. The history is provided by the patient.  Abdominal Pain Pain location:  RLQ Pain quality: aching   Pain radiates to:  Does not radiate Pain severity:  Moderate Onset quality:  Sudden Duration:  2 days Timing:  Constant Progression:  Worsening Chronicity:  New Context comment:  At rest Relieved by:  Nothing Worsened by:  Nothing tried Ineffective treatments:  None tried Associated symptoms: no chest pain, no cough, no diarrhea, no dysuria, no fatigue, no fever, no hematuria, no nausea, no shortness of breath and no vomiting     Past Medical History  Diagnosis Date  . Bronchitis   . Hypothyroid    Past Surgical History  Procedure Laterality Date  . Abdominal hysterectomy    . Cholecystectomy    . Arthoscopy r hand    . Total abdominal hysterectomy    . Dilation and curettage of uterus      X 2   Family History  Problem Relation Age of Onset  . Breast cancer Maternal Aunt   . Hypertension Maternal Grandmother   . Hypertension Mother   . Heart failure Maternal Grandmother   . Lung cancer Maternal Grandmother    History  Substance Use Topics  . Smoking status: Never Smoker   . Smokeless tobacco: Never Used  . Alcohol Use: No   OB History    Gravida Para Term Preterm AB TAB SAB Ectopic Multiple Living   5 3        2      Review of Systems  Constitutional: Negative for fever and fatigue.  HENT: Negative for congestion and drooling.   Eyes: Negative for pain.  Respiratory: Negative for cough and shortness of breath.   Cardiovascular: Negative for chest pain.  Gastrointestinal: Negative for nausea, vomiting, abdominal pain and  diarrhea.  Genitourinary: Negative for dysuria and hematuria.  Musculoskeletal: Negative for back pain, gait problem and neck pain.  Skin: Negative for color change.  Neurological: Negative for dizziness and headaches.  Hematological: Negative for adenopathy.  Psychiatric/Behavioral: Negative for behavioral problems.  All other systems reviewed and are negative.     Allergies  Review of patient's allergies indicates no known allergies.  Home Medications   Prior to Admission medications   Medication Sig Start Date End Date Taking? Authorizing Provider  fluconazole (DIFLUCAN) 150 MG tablet Take 1 tablet (150 mg total) by mouth once. 10/13/13   Lavonia Drafts, MD   BP 133/85 mmHg  Pulse 90  Temp(Src) 98.4 F (36.9 C) (Oral)  Resp 16  Ht 5\' 6"  (1.676 m)  Wt 164 lb (74.39 kg)  BMI 26.48 kg/m2  SpO2 100% Physical Exam  Constitutional: She is oriented to person, place, and time. She appears well-developed and well-nourished.  HENT:  Head: Normocephalic and atraumatic.  Mouth/Throat: Oropharynx is clear and moist. No oropharyngeal exudate.  Eyes: Conjunctivae and EOM are normal. Pupils are equal, round, and reactive to light.  Neck: Normal range of motion. Neck supple.  Cardiovascular: Normal rate, regular rhythm, normal heart sounds and intact distal pulses.  Exam reveals no gallop and no friction rub.   No murmur heard. Pulmonary/Chest: Effort normal and breath sounds normal. No respiratory distress.  She has no wheezes.  Abdominal: Soft. Bowel sounds are normal. There is tenderness (mild ttp of RLQ). There is no rebound and no guarding.  Musculoskeletal: Normal range of motion. She exhibits no edema or tenderness.  Neurological: She is alert and oriented to person, place, and time.  Skin: Skin is warm and dry.  Psychiatric: She has a normal mood and affect. Her behavior is normal.  Nursing note and vitals reviewed.   ED Course  Procedures (including critical care  time) Labs Review Labs Reviewed  WET PREP, GENITAL - Abnormal; Notable for the following:    Clue Cells Wet Prep HPF POC MODERATE (*)    All other components within normal limits  URINALYSIS, ROUTINE W REFLEX MICROSCOPIC - Abnormal; Notable for the following:    APPearance CLOUDY (*)    Leukocytes, UA SMALL (*)    All other components within normal limits  CBC WITH DIFFERENTIAL - Abnormal; Notable for the following:    WBC 3.9 (*)    All other components within normal limits  COMPREHENSIVE METABOLIC PANEL - Abnormal; Notable for the following:    Glucose, Bld 105 (*)    All other components within normal limits  URINE MICROSCOPIC-ADD ON - Abnormal; Notable for the following:    Squamous Epithelial / LPF FEW (*)    Bacteria, UA FEW (*)    All other components within normal limits  GC/CHLAMYDIA PROBE AMP  LIPASE, BLOOD    Imaging Review US Transvaginal Non-ob  12/27/2013   CLINICAL DATA:  Right lower quadrant pain.  Pelvic mass on CT.  EXAM: TRANSABDOMINAL AND TRANSVAGINAL ULTRASOUND OF PELVIS  DOPPLER ULTRASOUND OF OVARIES  TECHNIQUE: Both transabdominal and transvaginal ultrasound examinations of the pelvis were performed. Transabdominal technique was performed for global imaging of the pelvis including uterus, ovaries, adnexal regions, and pelvic cul-de-sac.  It was necessary to proceed with endovaginal exam following the transabdominal exam to visualize the adnexal mass. Color and duplex Doppler ultrasound was utilized to evaluate blood flow to the ovaries.  COMPARISON:  CT abdomen and pelvis 12/26/2013.  FINDINGS: Uterus  Uterus is surgically absent.  Endometrium  Surgically absent.  Right ovary  Measurements: 5.6 x 3.3 x 3.6 cm. Circumscribed mostly homogeneous hyperechoic structure measuring 3.7 x 3 x 2.9 cm. Peripheral flow is demonstrated without internal flow. This is likely to represent hemorrhagic cyst. A second circumscribed mostly hypoechoic structure with internal echoes  demonstrated measuring 2.3 x 2.5 by 2.1 cm. This is also likely to represent a hemorrhagic cyst or possibly endometrioma. It along a tubular structure in the right adnexum with echogenic fluid demonstrated. This is likely to represent hydrosalpinx or pyosalpinx.  Left ovary  Measurements: 3.1 x 2.4 x 2.9 cm simple appearing structure in the left ovary measuring 2 x 2.4 cm consistent with normal follicle. No internal flow demonstrated.  Pulsed Doppler evaluation of both ovaries demonstrates normal low-resistance arterial and venous waveforms. Flow is demonstrated in both ovaries on color flow Doppler imaging.  Other findings  Small amount of free fluid in the pelvis.  IMPRESSION: Two adjacent complex appearing structures in the right ovary, largest measuring 3.7 cm maximal diameter. These likely represent hemorrhagic cysts. Probable right hydrosalpinx. Suggest follow-up in 6-12 weeks to demonstrate resolution. Normal appearance of the left ovary. Uterus is surgically absent.   Electronically Signed   By: Lucienne Capers M.D.   On: 12/27/2013 00:47   US Pelvis Complete  12/27/2013   CLINICAL DATA:  Right lower quadrant pain.  Pelvic mass on CT.  EXAM: TRANSABDOMINAL AND TRANSVAGINAL ULTRASOUND OF PELVIS  DOPPLER ULTRASOUND OF OVARIES  TECHNIQUE: Both transabdominal and transvaginal ultrasound examinations of the pelvis were performed. Transabdominal technique was performed for global imaging of the pelvis including uterus, ovaries, adnexal regions, and pelvic cul-de-sac.  It was necessary to proceed with endovaginal exam following the transabdominal exam to visualize the adnexal mass. Color and duplex Doppler ultrasound was utilized to evaluate blood flow to the ovaries.  COMPARISON:  CT abdomen and pelvis 12/26/2013.  FINDINGS: Uterus  Uterus is surgically absent.  Endometrium  Surgically absent.  Right ovary  Measurements: 5.6 x 3.3 x 3.6 cm. Circumscribed mostly homogeneous hyperechoic structure measuring 3.7 x  3 x 2.9 cm. Peripheral flow is demonstrated without internal flow. This is likely to represent hemorrhagic cyst. A second circumscribed mostly hypoechoic structure with internal echoes demonstrated measuring 2.3 x 2.5 by 2.1 cm. This is also likely to represent a hemorrhagic cyst or possibly endometrioma. It along a tubular structure in the right adnexum with echogenic fluid demonstrated. This is likely to represent hydrosalpinx or pyosalpinx.  Left ovary  Measurements: 3.1 x 2.4 x 2.9 cm simple appearing structure in the left ovary measuring 2 x 2.4 cm consistent with normal follicle. No internal flow demonstrated.  Pulsed Doppler evaluation of both ovaries demonstrates normal low-resistance arterial and venous waveforms. Flow is demonstrated in both ovaries on color flow Doppler imaging.  Other findings  Small amount of free fluid in the pelvis.  IMPRESSION: Two adjacent complex appearing structures in the right ovary, largest measuring 3.7 cm maximal diameter. These likely represent hemorrhagic cysts. Probable right hydrosalpinx. Suggest follow-up in 6-12 weeks to demonstrate resolution. Normal appearance of the left ovary. Uterus is surgically absent.   Electronically Signed   By: Lucienne Capers M.D.   On: 12/27/2013 00:47   Ct Abdomen Pelvis W Contrast  12/26/2013   CLINICAL DATA:  Right lower quadrant abdominal pain and fever. Odor in the urine for 3 days.  EXAM: CT ABDOMEN AND PELVIS WITH CONTRAST  TECHNIQUE: Multidetector CT imaging of the abdomen and pelvis was performed using the standard protocol following bolus administration of intravenous contrast.  CONTRAST:  158mL OMNIPAQUE IOHEXOL 300 MG/ML  SOLN  COMPARISON:  03/29/2009  FINDINGS: Atelectasis in the lung bases.  Surgical absence of the gallbladder. The liver, spleen, pancreas, adrenal glands, kidneys, abdominal aorta, inferior vena cava, and retroperitoneal lymph nodes are unremarkable. Stomach, small bowel, and colon are not abnormally  distended and no wall thickening is appreciated. The no free air or free fluid in the abdomen. Minimal umbilical hernia containing fat.  Pelvis: Appendix is normal. There is enlarging septated cystic mass in the right ovary measuring up to about 5.5 x 6.7 cm on today's examination. Suggest ultrasound for further evaluation. Small cysts in the left ovary. Uterus is surgically absent. Bladder wall is not thickened. Small amount of free fluid in the pelvis is nonspecific, likely reactive. No evidence of diverticulitis. No destructive bone lesions.  IMPRESSION: Enlarging septated cystic mass in the right ovary. Ultrasound suggested for further evaluation.   Electronically Signed   By: Lucienne Capers M.D.   On: 12/26/2013 23:09   [place] Art/ven Flow Abd Pelv Doppler  12/27/2013   CLINICAL DATA:  Right lower quadrant pain.  Pelvic mass on CT.  EXAM: TRANSABDOMINAL AND TRANSVAGINAL ULTRASOUND OF PELVIS  DOPPLER ULTRASOUND OF OVARIES  TECHNIQUE: Both transabdominal and transvaginal ultrasound examinations of the pelvis were performed. Transabdominal technique was  performed for global imaging of the pelvis including uterus, ovaries, adnexal regions, and pelvic cul-de-sac.  It was necessary to proceed with endovaginal exam following the transabdominal exam to visualize the adnexal mass. Color and duplex Doppler ultrasound was utilized to evaluate blood flow to the ovaries.  COMPARISON:  CT abdomen and pelvis 12/26/2013.  FINDINGS: Uterus  Uterus is surgically absent.  Endometrium  Surgically absent.  Right ovary  Measurements: 5.6 x 3.3 x 3.6 cm. Circumscribed mostly homogeneous hyperechoic structure measuring 3.7 x 3 x 2.9 cm. Peripheral flow is demonstrated without internal flow. This is likely to represent hemorrhagic cyst. A second circumscribed mostly hypoechoic structure with internal echoes demonstrated measuring 2.3 x 2.5 by 2.1 cm. This is also likely to represent a hemorrhagic cyst or possibly endometrioma. It  along a tubular structure in the right adnexum with echogenic fluid demonstrated. This is likely to represent hydrosalpinx or pyosalpinx.  Left ovary  Measurements: 3.1 x 2.4 x 2.9 cm simple appearing structure in the left ovary measuring 2 x 2.4 cm consistent with normal follicle. No internal flow demonstrated.  Pulsed Doppler evaluation of both ovaries demonstrates normal low-resistance arterial and venous waveforms. Flow is demonstrated in both ovaries on color flow Doppler imaging.  Other findings  Small amount of free fluid in the pelvis.  IMPRESSION: Two adjacent complex appearing structures in the right ovary, largest measuring 3.7 cm maximal diameter. These likely represent hemorrhagic cysts. Probable right hydrosalpinx. Suggest follow-up in 6-12 weeks to demonstrate resolution. Normal appearance of the left ovary. Uterus is surgically absent.   Electronically Signed   By: Lucienne Capers M.D.   On: 12/27/2013 00:47     EKG Interpretation None      MDM   Final diagnoses:  RLQ abdominal pain  Pelvic mass    9:55 PM 48 y.o. female who presents with brisk onset right lower quadrant pain for 2 days. She has had subjective fever. Denies any vomiting or diarrhea. Has not had a bowel movement in 3 days. She is afebrile and vital signs are unremarkable here. We'll get screening labs and imaging. Dilaudid for pain control.   Pt found to have right ovarian mass on CT. Care transferred to Dr. Kathrynn Humble while awaiting [place] for further evaluation.    Pamella Pert, MD 12/27/13 1718  Pamella Pert, MD 12/27/13 1719

## 2013-12-26 NOTE — ED Provider Notes (Signed)
Ashley [name] is a 48 y.o. female who presents to Urgent Care today for Right lower quadrant abdominal pain. Patient has had abdominal pain and fever associated with odor and urine for 3 days. She's tried ibuprofen which helps only a little. She denies any nausea vomiting diarrhea or vaginal discharge. Her last menstrual period was in 2006 just prior to an abdominal hysterectomy. She denies any history of appendicitis or appendectomy.  [facility] was positive only for trace leukocyte esterase. She was advised to present to urgent care for further evaluation and management   Past Medical History  Diagnosis Date  . Bronchitis   . Hypothyroid    History  Substance Use Topics  . Smoking status: Never Smoker   . Smokeless tobacco: Never Used  . Alcohol Use: No   ROS as above Medications: No current facility-administered medications for this encounter.   Current Outpatient Prescriptions  Medication Sig Dispense Refill  . fluconazole (DIFLUCAN) 150 MG tablet Take 1 tablet (150 mg total) by mouth once. 1 tablet 0    Exam:  BP 149/98 mmHg  Pulse 101  Temp(Src) 98.7 F (37.1 C) (Oral)  Resp 18  SpO2 96% Gen: Well NAD HEENT: EOMI,  MMM Lungs: Normal work of breathing. CTABL Heart: RRR no MRG Abd: NABS, tender to palpation right lower quadrant with guarding and rebound. Right CV angle tenderness to percussion Exts: Brisk capillary refill, warm and well perfused.   No results found for this or any previous visit (from the past 24 hour(s)). No results found.  Assessment and Plan: 48 y.o. female with probable appendicitis. Transfer to emergency department for evaluation and management.  Discussed warning signs or symptoms. Please see discharge instructions. Patient expresses understanding.     Gregor Hams, MD 12/26/13 [phone]

## 2013-12-26 NOTE — ED Notes (Signed)
Pt is being transferred to [facility] via shuttle. Gave report to 1st nurse [place] RN

## 2013-12-27 ENCOUNTER — Encounter (HOSPITAL_COMMUNITY): Payer: Self-pay | Admitting: *Deleted

## 2013-12-27 LAB — WET PREP, GENITAL
Trich, Wet Prep: NONE SEEN
WBC, Wet Prep HPF POC: NONE SEEN
Yeast Wet Prep HPF POC: NONE SEEN

## 2013-12-27 MED ORDER — CEFTRIAXONE SODIUM 250 MG IJ SOLR
250.0000 mg | Freq: Once | INTRAMUSCULAR | Status: AC
  Administered 2013-12-27: 250 mg via INTRAMUSCULAR
  Filled 2013-12-27: qty 250

## 2013-12-27 MED ORDER — TRAMADOL HCL 50 MG PO TABS: 50.0000 mg | ORAL_TABLET | Freq: Four times a day (QID) | ORAL | Status: DC | PRN

## 2013-12-27 MED ORDER — HYDROMORPHONE HCL 1 MG/ML IJ SOLN
1.0000 mg | Freq: Once | INTRAMUSCULAR | Status: AC
  Administered 2013-12-27: 1 mg via INTRAVENOUS
  Filled 2013-12-27: qty 1

## 2013-12-27 MED ORDER — IBUPROFEN 600 MG PO TABS: 600.0000 mg | ORAL_TABLET | Freq: Four times a day (QID) | ORAL | Status: DC | PRN

## 2013-12-27 MED ORDER — DOXYCYCLINE HYCLATE 100 MG PO CAPS: 100.0000 mg | ORAL_CAPSULE | Freq: Two times a day (BID) | ORAL | Status: DC

## 2013-12-27 MED ORDER — DEXTROSE 5 % IV SOLN: 250.0000 mg | Freq: Once | INTRAVENOUS | Status: DC

## 2013-12-27 MED ORDER — LIDOCAINE HCL (PF) 1 % IJ SOLN
INTRAMUSCULAR | Status: AC
Start: 2013-12-27 — End: 2013-12-27
  Administered 2013-12-27: 5 mL
  Filled 2013-12-27: qty 5

## 2013-12-27 NOTE — Discharge Instructions (Signed)
The ultrasound results show that you have ovarian cyst, and some fluid in the fallopian tubes.  See your Gynec as soon as possible. Please take the following results with you.  FINDINGS: Uterus  Uterus is surgically absent.  Endometrium  Surgically absent.  Right ovary  Measurements: 5.6 x 3.3 x 3.6 cm. Circumscribed mostly homogeneous hyperechoic structure measuring 3.7 x 3 x 2.9 cm. Peripheral flow is demonstrated without internal flow. This is likely to represent hemorrhagic cyst. A second circumscribed mostly hypoechoic structure with internal echoes demonstrated measuring 2.3 x 2.5 by 2.1 cm. This is also likely to represent a hemorrhagic cyst or possibly endometrioma. It along a tubular structure in the right adnexum with echogenic fluid demonstrated. This is likely to represent hydrosalpinx or pyosalpinx.  Left ovary  Measurements: 3.1 x 2.4 x 2.9 cm simple appearing structure in the left ovary measuring 2 x 2.4 cm consistent with normal follicle. No internal flow demonstrated.  Pulsed Doppler evaluation of both ovaries demonstrates normal low-resistance arterial and venous waveforms. Flow is demonstrated in both ovaries on color flow Doppler imaging.  Other findings  Small amount of free fluid in the pelvis.  IMPRESSION: Two adjacent complex appearing structures in the right ovary, largest measuring 3.7 cm maximal diameter. These likely represent hemorrhagic cysts. Probable right hydrosalpinx. Suggest follow-up in 6-12 weeks to demonstrate resolution. Normal appearance of the left ovary. Uterus is surgically absent.   Electronically Signed By: Lucienne Capers M.D. On: 12/27/2013 00:47        Abdominal Pain, Women Abdominal (stomach, pelvic, or belly) pain can be caused by many things. It is important to tell your doctor:  The location of the pain.  Does it come and go or is it present all the time?  Are there things that start the pain  (eating certain foods, exercise)?  Are there other symptoms associated with the pain (fever, nausea, vomiting, diarrhea)? All of this is helpful to know when trying to find the cause of the pain. CAUSES   Stomach: virus or bacteria infection, or ulcer.  Intestine: appendicitis (inflamed appendix), regional ileitis (Crohn's disease), ulcerative colitis (inflamed colon), irritable bowel syndrome, diverticulitis (inflamed diverticulum of the colon), or cancer of the stomach or intestine.  Gallbladder disease or stones in the gallbladder.  Kidney disease, kidney stones, or infection.  Pancreas infection or cancer.  Fibromyalgia (pain disorder).  Diseases of the female organs:  Uterus: fibroid (non-cancerous) tumors or infection.  Fallopian tubes: infection or tubal pregnancy.  Ovary: cysts or tumors.  Pelvic adhesions (scar tissue).  Endometriosis (uterus lining tissue growing in the pelvis and on the pelvic organs).  Pelvic congestion syndrome (female organs filling up with blood just before the menstrual period).  Pain with the menstrual period.  Pain with ovulation (producing an egg).  Pain with an IUD (intrauterine device, birth control) in the uterus.  Cancer of the female organs.  Functional pain (pain not caused by a disease, may improve without treatment).  Psychological pain.  Depression. DIAGNOSIS  Your doctor will decide the seriousness of your pain by doing an examination.  Blood tests.  X-rays.  Ultrasound.  CT scan (computed tomography, special type of X-ray).  MRI (magnetic resonance imaging).  Cultures, for infection.  Barium enema (dye inserted in the large intestine, to better view it with X-rays).  Colonoscopy (looking in intestine with a lighted tube).  Laparoscopy (minor surgery, looking in abdomen with a lighted tube).  Major abdominal exploratory surgery (looking in abdomen with a large  incision). TREATMENT  The treatment will  depend on the cause of the pain.   Many cases can be observed and treated at home.  Over-the-counter medicines recommended by your caregiver.  Prescription medicine.  Antibiotics, for infection.  Birth control pills, for painful periods or for ovulation pain.  Hormone treatment, for endometriosis.  Nerve blocking injections.  Physical therapy.  Antidepressants.  Counseling with a psychologist or psychiatrist.  Minor or major surgery. HOME CARE INSTRUCTIONS   Do not take laxatives, unless directed by your caregiver.  Take over-the-counter pain medicine only if ordered by your caregiver. Do not take aspirin because it can cause an upset stomach or bleeding.  Try a clear liquid diet (broth or water) as ordered by your caregiver. Slowly move to a bland diet, as tolerated, if the pain is related to the stomach or intestine.  Have a thermometer and take your temperature several times a day, and record it.  Bed rest and sleep, if it helps the pain.  Avoid sexual intercourse, if it causes pain.  Avoid stressful situations.  Keep your follow-up appointments and tests, as your caregiver orders.  If the pain does not go away with medicine or surgery, you may try:  Acupuncture.  Relaxation exercises (yoga, meditation).  Group therapy.  Counseling. SEEK MEDICAL CARE IF:   You notice certain foods cause stomach pain.  Your home care treatment is not helping your pain.  You need stronger pain medicine.  You want your IUD removed.  You feel faint or lightheaded.  You develop nausea and vomiting.  You develop a rash.  You are having side effects or an allergy to your medicine. SEEK IMMEDIATE MEDICAL CARE IF:   Your pain does not go away or gets worse.  You have a fever.  Your pain is felt only in portions of the abdomen. The right side could possibly be appendicitis. The left lower portion of the abdomen could be colitis or diverticulitis.  You are passing  blood in your stools (bright red or black tarry stools, with or without vomiting).  You have blood in your urine.  You develop chills, with or without a fever.  You pass out. MAKE SURE YOU:   Understand these instructions.  Will watch your condition.  Will get help right away if you are not doing well or get worse. Document Released: 12/09/2006 Document Revised: 06/28/2013 Document Reviewed: 12/29/2008 [facility] Patient Information 2015 [place], [place]. This information is not intended to replace advice given to you by your health care provider. Make sure you discuss any questions you have with your health care provider. Ovarian Cyst An ovarian cyst is a fluid-filled sac that forms on an ovary. The ovaries are small organs that produce eggs in women. Various types of cysts can form on the ovaries. Most are not cancerous. Many do not cause problems, and they often go away on their own. Some may cause symptoms and require treatment. Common types of ovarian cysts include:  Functional cysts--These cysts may occur every month during the menstrual cycle. This is normal. The cysts usually go away with the next menstrual cycle if the woman does not get pregnant. Usually, there are no symptoms with a functional cyst.  Endometrioma cysts--These cysts form from the tissue that lines the uterus. They are also called "chocolate cysts" because they become filled with blood that turns brown. This type of cyst can cause pain in the lower abdomen during intercourse and with your menstrual period.  Cystadenoma cysts--This type  develops from the cells on the outside of the ovary. These cysts can get very big and cause lower abdomen pain and pain with intercourse. This type of cyst can twist on itself, cut off its blood supply, and cause severe pain. It can also easily rupture and cause a lot of pain.  Dermoid cysts--This type of cyst is sometimes found in both ovaries. These cysts may contain different kinds of  body tissue, such as skin, teeth, hair, or cartilage. They usually do not cause symptoms unless they get very big.  Theca lutein cysts--These cysts occur when too much of a certain hormone (human chorionic gonadotropin) is produced and overstimulates the ovaries to produce an egg. This is most common after procedures used to assist with the conception of a baby (in vitro fertilization). CAUSES   Fertility drugs can cause a condition in which multiple large cysts are formed on the ovaries. This is called ovarian hyperstimulation syndrome.  A condition called polycystic ovary syndrome can cause hormonal imbalances that can lead to nonfunctional ovarian cysts. SIGNS AND SYMPTOMS  Many ovarian cysts do not cause symptoms. If symptoms are present, they may include:  Pelvic pain or pressure.  Pain in the lower abdomen.  Pain during sexual intercourse.  Increasing girth (swelling) of the abdomen.  Abnormal menstrual periods.  Increasing pain with menstrual periods.  Stopping having menstrual periods without being pregnant. DIAGNOSIS  These cysts are commonly found during a routine or annual pelvic exam. Tests may be ordered to find out more about the cyst. These tests may include:  Ultrasound.  X-ray of the pelvis.  CT scan.  MRI.  Blood tests. TREATMENT  Many ovarian cysts go away on their own without treatment. Your health care provider may want to check your cyst regularly for 2-3 months to see if it changes. For women in menopause, it is particularly important to monitor a cyst closely because of the higher rate of ovarian cancer in menopausal women. When treatment is needed, it may include any of the following:  A procedure to drain the cyst (aspiration). This may be done using a long needle and ultrasound. It can also be done through a laparoscopic procedure. This involves using a thin, lighted tube with a tiny camera on the end (laparoscope) inserted through a small  incision.  Surgery to remove the whole cyst. This may be done using laparoscopic surgery or an open surgery involving a larger incision in the lower abdomen.  Hormone treatment or birth control pills. These methods are sometimes used to help dissolve a cyst. HOME CARE INSTRUCTIONS   Only take over-the-counter or prescription medicines as directed by your health care provider.  Follow up with your health care provider as directed.  Get regular pelvic exams and Pap tests. SEEK MEDICAL CARE IF:   Your periods are late, irregular, or painful, or they stop.  Your pelvic pain or abdominal pain does not go away.  Your abdomen becomes larger or swollen.  You have pressure on your bladder or trouble emptying your bladder completely.  You have pain during sexual intercourse.  You have feelings of fullness, pressure, or discomfort in your stomach.  You lose weight for no apparent reason.  You feel generally ill.  You become constipated.  You lose your appetite.  You develop acne.  You have an increase in body and facial hair.  You are gaining weight, without changing your exercise and eating habits.  You think you are pregnant. Lilly  IF:   You have increasing abdominal pain.  You feel sick to your stomach (nauseous), and you throw up (vomit).  You develop a fever that comes on suddenly.  You have abdominal pain during a bowel movement.  Your menstrual periods become heavier than usual. MAKE SURE YOU:  Understand these instructions.  Will watch your condition.  Will get help right away if you are not doing well or get worse. Document Released: 02/11/2005 Document Revised: 02/16/2013 Document Reviewed: 10/19/2012 [facility] Patient Information 2015 [place], [place]. This information is not intended to replace advice given to you by your health care provider. Make sure you discuss any questions you have with your health care provider.

## 2013-12-27 NOTE — ED Notes (Signed)
Dr. Nanavati at bedside 

## 2013-12-27 NOTE — ED Provider Notes (Addendum)
Assuming care of patient this evening. Patient in the ED for RLQ abd pain. CT is neg for appendicitis - but there is a pelvic mass.                   Awaiting [place] - r/o trosion, and evaluate the mass. Labs are WNL. Patient had no complains, no concerns from the nursing side. Will continue to monitor.   Varney Biles, MD 12/27/13 0023  3:52 AM US shows the the pelvic mass is likely related to ovarian cyst. However, there was hydro vs. Pyosalpinx and so GU exam was done. Pt has some focal RLQ discomfort, yellow discharge. No cervix. To be safe, we will tx her as PID, as she is having intercourse, unprotected, but her partner might be sleeping with other people. Gyne f/u requested.    Varney Biles, MD 12/27/13 [phone]

## 2013-12-28 LAB — GC/CHLAMYDIA PROBE AMP
CT Probe RNA: NEGATIVE
GC Probe RNA: NEGATIVE

## 2014-01-04 ENCOUNTER — Encounter: Payer: Self-pay | Admitting: Obstetrics and Gynecology

## 2014-01-04 ENCOUNTER — Ambulatory Visit (INDEPENDENT_AMBULATORY_CARE_PROVIDER_SITE_OTHER): Payer: Managed Care, Other (non HMO) | Admitting: Obstetrics and Gynecology

## 2014-01-04 VITALS — BP 135/87 | HR 88 | Ht 66.0 in | Wt 163.0 lb

## 2014-01-04 DIAGNOSIS — N83201 Unspecified ovarian cyst, right side: Secondary | ICD-10-CM

## 2014-01-04 DIAGNOSIS — N8320 Unspecified ovarian cysts: Secondary | ICD-10-CM

## 2014-01-04 MED ORDER — FLUCONAZOLE 150 MG PO TABS: 150.0000 mg | ORAL_TABLET | Freq: Once | ORAL | Status: DC

## 2014-01-04 NOTE — Progress Notes (Signed)
Patient ID: Ashley [name], female   DOB: 28-Sep-1965, 48 y.o.   MRN: [id number] 48 yo [id number] presenting today for ED follow up of a right hemorrhagic cyst. Patient states that she is feeling a little better since leaving the ED. She states that her pain is better controlled but seems to be at its worst in the evening after being up all day.   Past Medical History  Diagnosis Date  . Bronchitis   . Hypothyroid    Past Surgical History  Procedure Laterality Date  . Abdominal hysterectomy    . Cholecystectomy    . Arthoscopy r hand    . Total abdominal hysterectomy    . Dilation and curettage of uterus      X 2   Family History  Problem Relation Age of Onset  . Breast cancer Maternal Aunt   . Hypertension Maternal Grandmother   . Hypertension Mother   . Heart failure Maternal Grandmother   . Lung cancer Maternal Grandmother    History  Substance Use Topics  . Smoking status: Never Smoker   . Smokeless tobacco: Never Used  . Alcohol Use: No   GENERAL: Well-developed, well-nourished female in no acute distress.  ABDOMEN: Soft, nontender, nondistended. No organomegaly. PELVIC: Not indicated EXTREMITIES: No cyanosis, clubbing, or edema, 2+ distal pulses.  Ultrasound FINDINGS: Uterus  Uterus is surgically absent.  Endometrium  Surgically absent.  Right ovary  Measurements: 5.6 x 3.3 x 3.6 cm. Circumscribed mostly homogeneous hyperechoic structure measuring 3.7 x 3 x 2.9 cm. Peripheral flow is demonstrated without internal flow. This is likely to represent hemorrhagic cyst. A second circumscribed mostly hypoechoic structure with internal echoes demonstrated measuring 2.3 x 2.5 by 2.1 cm. This is also likely to represent a hemorrhagic cyst or possibly endometrioma. It along a tubular structure in the right adnexum with echogenic fluid demonstrated. This is likely to represent hydrosalpinx or pyosalpinx.  Left ovary  Measurements: 3.1 x 2.4 x 2.9 cm simple  appearing structure in the left ovary measuring 2 x 2.4 cm consistent with normal follicle. No internal flow demonstrated.  Pulsed Doppler evaluation of both ovaries demonstrates normal low-resistance arterial and venous waveforms. Flow is demonstrated in both ovaries on color flow Doppler imaging.  Other findings  Small amount of free fluid in the pelvis.  IMPRESSION: Two adjacent complex appearing structures in the right ovary, largest measuring 3.7 cm maximal diameter. These likely represent hemorrhagic cysts. Probable right hydrosalpinx. Suggest follow-up in 6-12 weeks to demonstrate resolution. Normal appearance of the left ovary. Uterus is surgically absent.   Electronically Signed  By: Lucienne Capers M.D.  On: 12/27/2013 00:47  A/P 48 yo with a right hemorrhagic cyst - Continue pain management - Will schedule repeat ultrasound for the first week of January - rtc prn

## 2014-02-25 HISTORY — PX: SHOULDER SURGERY: SHX246

## 2014-03-01 ENCOUNTER — Ambulatory Visit (HOSPITAL_COMMUNITY)
Admission: RE | Admit: 2014-03-01 | Discharge: 2014-03-01 | Disposition: A | Discharge status: Home / Self Care | Payer: Managed Care, Other (non HMO) | Source: Ambulatory Visit | Attending: Obstetrics and Gynecology | Admitting: Obstetrics and Gynecology

## 2014-03-01 DIAGNOSIS — N7011 Chronic salpingitis: Secondary | ICD-10-CM | POA: Diagnosis not present

## 2014-03-01 DIAGNOSIS — N83201 Unspecified ovarian cyst, right side: Secondary | ICD-10-CM

## 2014-03-01 DIAGNOSIS — N8320 Unspecified ovarian cysts: Secondary | ICD-10-CM | POA: Diagnosis present

## 2014-03-04 ENCOUNTER — Telehealth: Payer: Self-pay | Admitting: *Deleted

## 2014-03-04 NOTE — Telephone Encounter (Signed)
-----   Message from Mora Bellman, MD sent at 03/03/2014  4:28 PM EST ----- Please inform patient of complete resolution of ovarian cyst from 03/01/2014 ultrasound  Thanks  Peggy

## 2014-03-04 NOTE — Telephone Encounter (Signed)
Attempted to contact patient, no answer, left message that we are calling with results and to please contact the clinic.

## 2014-03-07 NOTE — Telephone Encounter (Signed)
Nessie left a message she is returning a call from Friday about her ultrasound results. Called Chika and informed her per Dr. Elly Modena ultrasound 03/01/14 shows complete resolultion of ovarian cyst. Ivin Booty voices understanding and no other questions voiced.

## 2014-05-23 ENCOUNTER — Ambulatory Visit: Payer: Self-pay | Admitting: Family Medicine

## 2014-06-01 ENCOUNTER — Ambulatory Visit: Payer: Self-pay | Admitting: Family Medicine

## 2014-08-08 ENCOUNTER — Encounter: Payer: Self-pay | Admitting: Obstetrics & Gynecology

## 2014-08-08 ENCOUNTER — Ambulatory Visit (INDEPENDENT_AMBULATORY_CARE_PROVIDER_SITE_OTHER): Payer: Managed Care, Other (non HMO) | Admitting: Obstetrics & Gynecology

## 2014-08-08 VITALS — BP 148/93 | HR 83 | Ht 66.0 in | Wt 164.0 lb

## 2014-08-08 DIAGNOSIS — A499 Bacterial infection, unspecified: Secondary | ICD-10-CM

## 2014-08-08 DIAGNOSIS — Z0000 Encounter for general adult medical examination without abnormal findings: Secondary | ICD-10-CM

## 2014-08-08 DIAGNOSIS — N941 Dyspareunia: Secondary | ICD-10-CM | POA: Diagnosis not present

## 2014-08-08 DIAGNOSIS — N760 Acute vaginitis: Secondary | ICD-10-CM

## 2014-08-08 DIAGNOSIS — Z01419 Encounter for gynecological examination (general) (routine) without abnormal findings: Secondary | ICD-10-CM | POA: Diagnosis not present

## 2014-08-08 DIAGNOSIS — IMO0002 Reserved for concepts with insufficient information to code with codable children: Secondary | ICD-10-CM

## 2014-08-08 DIAGNOSIS — R350 Frequency of micturition: Secondary | ICD-10-CM

## 2014-08-08 DIAGNOSIS — B9689 Other specified bacterial agents as the cause of diseases classified elsewhere: Secondary | ICD-10-CM

## 2014-08-08 NOTE — Patient Instructions (Signed)
Preventive Care for Adults A healthy lifestyle and preventive care can promote health and wellness. Preventive health guidelines for women include the following key practices.  A routine yearly physical is a good way to check with your health care provider about your health and preventive screening. It is a chance to share any concerns and updates on your health and to receive a thorough exam.  Visit your dentist for a routine exam and preventive care every 6 months. Brush your teeth twice a day and floss once a day. Good oral hygiene prevents tooth decay and gum disease.  The frequency of eye exams is based on your age, health, family medical history, use of contact lenses, and other factors. Follow your health care provider's recommendations for frequency of eye exams.  Eat a healthy diet. Foods like vegetables, fruits, whole grains, low-fat dairy products, and lean protein foods contain the nutrients you need without too many calories. Decrease your intake of foods high in solid fats, added sugars, and salt. Eat the right amount of calories for you.Get information about a proper diet from your health care provider, if necessary.  Regular physical exercise is one of the most important things you can do for your health. Most adults should get at least 150 minutes of moderate-intensity exercise (any activity that increases your heart rate and causes you to sweat) each week. In addition, most adults need muscle-strengthening exercises on 2 or more days a week.  Maintain a healthy weight. The body mass index (BMI) is a screening tool to identify possible weight problems. It provides an estimate of body fat based on height and weight. Your health care provider can find your BMI and can help you achieve or maintain a healthy weight.For adults 20 years and older:  A BMI below 18.5 is considered underweight.  A BMI of 18.5 to 24.9 is normal.  A BMI of 25 to 29.9 is considered overweight.  A BMI of  30 and above is considered obese.  Maintain normal blood lipids and cholesterol levels by exercising and minimizing your intake of saturated fat. Eat a balanced diet with plenty of fruit and vegetables. Blood tests for lipids and cholesterol should begin at age 76 and be repeated every 5 years. If your lipid or cholesterol levels are high, you are over 50, or you are at high risk for heart disease, you may need your cholesterol levels checked more frequently.Ongoing high lipid and cholesterol levels should be treated with medicines if diet and exercise are not working.  If you smoke, find out from your health care provider how to quit. If you do not use tobacco, do not start.  Lung cancer screening is recommended for adults aged 22-80 years who are at high risk for developing lung cancer because of a history of smoking. A yearly low-dose CT scan of the lungs is recommended for people who have at least a 30-pack-year history of smoking and are a current smoker or have quit within the past 15 years. A pack year of smoking is smoking an average of 1 pack of cigarettes a day for 1 year (for example: 1 pack a day for 30 years or 2 packs a day for 15 years). Yearly screening should continue until the smoker has stopped smoking for at least 15 years. Yearly screening should be stopped for people who develop a health problem that would prevent them from having lung cancer treatment.  If you are pregnant, do not drink alcohol. If you are breastfeeding,  be very cautious about drinking alcohol. If you are not pregnant and choose to drink alcohol, do not have more than 1 drink per day. One drink is considered to be 12 ounces (355 mL) of beer, 5 ounces (148 mL) of wine, or 1.5 ounces (44 mL) of liquor.  Avoid use of street drugs. Do not share needles with anyone. Ask for help if you need support or instructions about stopping the use of drugs.  High blood pressure causes heart disease and increases the risk of  stroke. Your blood pressure should be checked at least every 1 to 2 years. Ongoing high blood pressure should be treated with medicines if weight loss and exercise do not work.  If you are 3-86 years old, ask your health care provider if you should take aspirin to prevent strokes.  Diabetes screening involves taking a blood sample to check your fasting blood sugar level. This should be done once every 3 years, after age 67, if you are within normal weight and without risk factors for diabetes. Testing should be considered at a younger age or be carried out more frequently if you are overweight and have at least 1 risk factor for diabetes.  Breast cancer screening is essential preventive care for women. You should practice "breast self-awareness." This means understanding the normal appearance and feel of your breasts and may include breast self-examination. Any changes detected, no matter how small, should be reported to a health care provider. Women in their 8s and 30s should have a clinical breast exam (CBE) by a health care provider as part of a regular health exam every 1 to 3 years. After age 70, women should have a CBE every year. Starting at age 25, women should consider having a mammogram (breast X-ray test) every year. Women who have a family history of breast cancer should talk to their health care provider about genetic screening. Women at a high risk of breast cancer should talk to their health care providers about having an MRI and a mammogram every year.  Breast cancer gene (BRCA)-related cancer risk assessment is recommended for women who have family members with BRCA-related cancers. BRCA-related cancers include breast, ovarian, tubal, and peritoneal cancers. Having family members with these cancers may be associated with an increased risk for harmful changes (mutations) in the breast cancer genes BRCA1 and BRCA2. Results of the assessment will determine the need for genetic counseling and  BRCA1 and BRCA2 testing.  Routine pelvic exams to screen for cancer are no longer recommended for nonpregnant women who are considered low risk for cancer of the pelvic organs (ovaries, uterus, and vagina) and who do not have symptoms. Ask your health care provider if a screening pelvic exam is right for you.  If you have had past treatment for cervical cancer or a condition that could lead to cancer, you need Pap tests and screening for cancer for at least 20 years after your treatment. If Pap tests have been discontinued, your risk factors (such as having a new sexual partner) need to be reassessed to determine if screening should be resumed. Some women have medical problems that increase the chance of getting cervical cancer. In these cases, your health care provider may recommend more frequent screening and Pap tests.  The HPV test is an additional test that may be used for cervical cancer screening. The HPV test looks for the virus that can cause the cell changes on the cervix. The cells collected during the Pap test can be  tested for HPV. The HPV test could be used to screen women aged 30 years and older, and should be used in women of any age who have unclear Pap test results. After the age of 30, women should have HPV testing at the same frequency as a Pap test.  Colorectal cancer can be detected and often prevented. Most routine colorectal cancer screening begins at the age of 50 years and continues through age 75 years. However, your health care provider may recommend screening at an earlier age if you have risk factors for colon cancer. On a yearly basis, your health care provider may provide home test kits to check for hidden blood in the stool. Use of a small camera at the end of a tube, to directly examine the colon (sigmoidoscopy or colonoscopy), can detect the earliest forms of colorectal cancer. Talk to your health care provider about this at age 50, when routine screening begins. Direct  exam of the colon should be repeated every 5-10 years through age 75 years, unless early forms of pre-cancerous polyps or small growths are found.  People who are at an increased risk for hepatitis B should be screened for this virus. You are considered at high risk for hepatitis B if:  You were born in a country where hepatitis B occurs often. Talk with your health care provider about which countries are considered high risk.  Your parents were born in a high-risk country and you have not received a shot to protect against hepatitis B (hepatitis B vaccine).  You have HIV or AIDS.  You use needles to inject street drugs.  You live with, or have sex with, someone who has hepatitis B.  You get hemodialysis treatment.  You take certain medicines for conditions like cancer, organ transplantation, and autoimmune conditions.  Hepatitis C blood testing is recommended for all people born from 1945 through 1965 and any individual with known risks for hepatitis C.  Practice safe sex. Use condoms and avoid high-risk sexual practices to reduce the spread of sexually transmitted infections (STIs). STIs include gonorrhea, chlamydia, syphilis, trichomonas, herpes, HPV, and human immunodeficiency virus (HIV). Herpes, HIV, and HPV are viral illnesses that have no cure. They can result in disability, cancer, and death.  You should be screened for sexually transmitted illnesses (STIs) including gonorrhea and chlamydia if:  You are sexually active and are younger than 24 years.  You are older than 24 years and your health care provider tells you that you are at risk for this type of infection.  Your sexual activity has changed since you were last screened and you are at an increased risk for chlamydia or gonorrhea. Ask your health care provider if you are at risk.  If you are at risk of being infected with HIV, it is recommended that you take a prescription medicine daily to prevent HIV infection. This is  called preexposure prophylaxis (PrEP). You are considered at risk if:  You are a heterosexual woman, are sexually active, and are at increased risk for HIV infection.  You take drugs by injection.  You are sexually active with a partner who has HIV.  Talk with your health care provider about whether you are at high risk of being infected with HIV. If you choose to begin PrEP, you should first be tested for HIV. You should then be tested every 3 months for as long as you are taking PrEP.  Osteoporosis is a disease in which the bones lose minerals and strength   with aging. This can result in serious bone fractures or breaks. The risk of osteoporosis can be identified using a bone density scan. Women ages 65 years and over and women at risk for fractures or osteoporosis should discuss screening with their health care providers. Ask your health care provider whether you should take a calcium supplement or vitamin D to reduce the rate of osteoporosis.  Menopause can be associated with physical symptoms and risks. Hormone replacement therapy is available to decrease symptoms and risks. You should talk to your health care provider about whether hormone replacement therapy is right for you.  Use sunscreen. Apply sunscreen liberally and repeatedly throughout the day. You should seek shade when your shadow is shorter than you. Protect yourself by wearing long sleeves, pants, a wide-brimmed hat, and sunglasses year round, whenever you are outdoors.  Once a month, do a whole body skin exam, using a mirror to look at the skin on your back. Tell your health care provider of new moles, moles that have irregular borders, moles that are larger than a pencil eraser, or moles that have changed in shape or color.  Stay current with required vaccines (immunizations).  Influenza vaccine. All adults should be immunized every year.  Tetanus, diphtheria, and acellular pertussis (Td, Tdap) vaccine. Pregnant women should  receive 1 dose of Tdap vaccine during each pregnancy. The dose should be obtained regardless of the length of time since the last dose. Immunization is preferred during the 27th-36th week of gestation. An adult who has not previously received Tdap or who does not know her vaccine status should receive 1 dose of Tdap. This initial dose should be followed by tetanus and diphtheria toxoids (Td) booster doses every 10 years. Adults with an unknown or incomplete history of completing a 3-dose immunization series with Td-containing vaccines should begin or complete a primary immunization series including a Tdap dose. Adults should receive a Td booster every 10 years.  Varicella vaccine. An adult without evidence of immunity to varicella should receive 2 doses or a second dose if she has previously received 1 dose. Pregnant females who do not have evidence of immunity should receive the first dose after pregnancy. This first dose should be obtained before leaving the health care facility. The second dose should be obtained 4-8 weeks after the first dose.  Human papillomavirus (HPV) vaccine. Females aged 13-26 years who have not received the vaccine previously should obtain the 3-dose series. The vaccine is not recommended for use in pregnant females. However, pregnancy testing is not needed before receiving a dose. If a female is found to be pregnant after receiving a dose, no treatment is needed. In that case, the remaining doses should be delayed until after the pregnancy. Immunization is recommended for any person with an immunocompromised condition through the age of 26 years if she did not get any or all doses earlier. During the 3-dose series, the second dose should be obtained 4-8 weeks after the first dose. The third dose should be obtained 24 weeks after the first dose and 16 weeks after the second dose.  Zoster vaccine. One dose is recommended for adults aged 60 years or older unless certain conditions are  present.  Measles, mumps, and rubella (MMR) vaccine. Adults born before 1957 generally are considered immune to measles and mumps. Adults born in 1957 or later should have 1 or more doses of MMR vaccine unless there is a contraindication to the vaccine or there is laboratory evidence of immunity to   each of the three diseases. A routine second dose of MMR vaccine should be obtained at least 28 days after the first dose for students attending postsecondary schools, health care workers, or international travelers. People who received inactivated measles vaccine or an unknown type of measles vaccine during 1963-1967 should receive 2 doses of MMR vaccine. People who received inactivated mumps vaccine or an unknown type of mumps vaccine before 1979 and are at high risk for mumps infection should consider immunization with 2 doses of MMR vaccine. For females of childbearing age, rubella immunity should be determined. If there is no evidence of immunity, females who are not pregnant should be vaccinated. If there is no evidence of immunity, females who are pregnant should delay immunization until after pregnancy. Unvaccinated health care workers born before 1957 who lack laboratory evidence of measles, mumps, or rubella immunity or laboratory confirmation of disease should consider measles and mumps immunization with 2 doses of MMR vaccine or rubella immunization with 1 dose of MMR vaccine.  Pneumococcal 13-valent conjugate (PCV13) vaccine. When indicated, a person who is uncertain of her immunization history and has no record of immunization should receive the PCV13 vaccine. An adult aged 19 years or older who has certain medical conditions and has not been previously immunized should receive 1 dose of PCV13 vaccine. This PCV13 should be followed with a dose of pneumococcal polysaccharide (PPSV23) vaccine. The PPSV23 vaccine dose should be obtained at least 8 weeks after the dose of PCV13 vaccine. An adult aged 19  years or older who has certain medical conditions and previously received 1 or more doses of PPSV23 vaccine should receive 1 dose of PCV13. The PCV13 vaccine dose should be obtained 1 or more years after the last PPSV23 vaccine dose.  Pneumococcal polysaccharide (PPSV23) vaccine. When PCV13 is also indicated, PCV13 should be obtained first. All adults aged 65 years and older should be immunized. An adult younger than age 65 years who has certain medical conditions should be immunized. Any person who resides in a nursing home or long-term care facility should be immunized. An adult smoker should be immunized. People with an immunocompromised condition and certain other conditions should receive both PCV13 and PPSV23 vaccines. People with human immunodeficiency virus (HIV) infection should be immunized as soon as possible after diagnosis. Immunization during chemotherapy or radiation therapy should be avoided. Routine use of PPSV23 vaccine is not recommended for American Indians, [place] Natives, or people younger than 65 years unless there are medical conditions that require PPSV23 vaccine. When indicated, people who have unknown immunization and have no record of immunization should receive PPSV23 vaccine. One-time revaccination 5 years after the first dose of PPSV23 is recommended for people aged 19-64 years who have chronic kidney failure, nephrotic syndrome, asplenia, or immunocompromised conditions. People who received 1-2 doses of PPSV23 before age 65 years should receive another dose of PPSV23 vaccine at age 65 years or later if at least 5 years have passed since the previous dose. Doses of PPSV23 are not needed for people immunized with PPSV23 at or after age 65 years.  Meningococcal vaccine. Adults with asplenia or persistent complement component deficiencies should receive 2 doses of quadrivalent meningococcal conjugate (MenACWY-D) vaccine. The doses should be obtained at least 2 months apart.  Microbiologists working with certain meningococcal bacteria, military recruits, people at risk during an outbreak, and people who travel to or live in countries with a high rate of meningitis should be immunized. A first-year college student up through age   21 years who is living in a residence hall should receive a dose if she did not receive a dose on or after her 16th birthday. Adults who have certain high-risk conditions should receive one or more doses of vaccine.  Hepatitis A vaccine. Adults who wish to be protected from this disease, have certain high-risk conditions, work with hepatitis A-infected animals, work in hepatitis A research labs, or travel to or work in countries with a high rate of hepatitis A should be immunized. Adults who were previously unvaccinated and who anticipate close contact with an international adoptee during the first 60 days after arrival in the [place] from a country with a high rate of hepatitis A should be immunized.  Hepatitis B vaccine. Adults who wish to be protected from this disease, have certain high-risk conditions, may be exposed to blood or other infectious body fluids, are household contacts or sex partners of hepatitis B positive people, are clients or workers in certain care facilities, or travel to or work in countries with a high rate of hepatitis B should be immunized.  Haemophilus influenzae type b (Hib) vaccine. A previously unvaccinated person with asplenia or sickle cell disease or having a scheduled splenectomy should receive 1 dose of Hib vaccine. Regardless of previous immunization, a recipient of a hematopoietic stem cell transplant should receive a 3-dose series 6-12 months after her successful transplant. Hib vaccine is not recommended for adults with HIV infection. Preventive Services / Frequency Ages 64 to 68 years  Blood pressure check.** / Every 1 to 2 years.  Lipid and cholesterol check.** / Every 5 years beginning at age  22.  Clinical breast exam.** / Every 3 years for women in their 88s and 53s.  BRCA-related cancer risk assessment.** / For women who have family members with a BRCA-related cancer (breast, ovarian, tubal, or peritoneal cancers).  Pap test.** / Every 2 years from ages 90 through 51. Every 3 years starting at age 21 through age 56 or 3 with a history of 3 consecutive normal Pap tests.  HPV screening.** / Every 3 years from ages 24 through ages 1 to 46 with a history of 3 consecutive normal Pap tests.  Hepatitis C blood test.** / For any individual with known risks for hepatitis C.  Skin self-exam. / Monthly.  Influenza vaccine. / Every year.  Tetanus, diphtheria, and acellular pertussis (Tdap, Td) vaccine.** / Consult your health care provider. Pregnant women should receive 1 dose of Tdap vaccine during each pregnancy. 1 dose of Td every 10 years.  Varicella vaccine.** / Consult your health care provider. Pregnant females who do not have evidence of immunity should receive the first dose after pregnancy.  HPV vaccine. / 3 doses over 6 months, if 72 and younger. The vaccine is not recommended for use in pregnant females. However, pregnancy testing is not needed before receiving a dose.  Measles, mumps, rubella (MMR) vaccine.** / You need at least 1 dose of MMR if you were born in 1957 or later. You may also need a 2nd dose. For females of childbearing age, rubella immunity should be determined. If there is no evidence of immunity, females who are not pregnant should be vaccinated. If there is no evidence of immunity, females who are pregnant should delay immunization until after pregnancy.  Pneumococcal 13-valent conjugate (PCV13) vaccine.** / Consult your health care provider.  Pneumococcal polysaccharide (PPSV23) vaccine.** / 1 to 2 doses if you smoke cigarettes or if you have certain conditions.  Meningococcal vaccine.** /  1 dose if you are age 19 to 21 years and a first-year college  student living in a residence hall, or have one of several medical conditions, you need to get vaccinated against meningococcal disease. You may also need additional booster doses.  Hepatitis A vaccine.** / Consult your health care provider.  Hepatitis B vaccine.** / Consult your health care provider.  Haemophilus influenzae type b (Hib) vaccine.** / Consult your health care provider. Ages 40 to 64 years  Blood pressure check.** / Every 1 to 2 years.  Lipid and cholesterol check.** / Every 5 years beginning at age 20 years.  Lung cancer screening. / Every year if you are aged 55-80 years and have a 30-pack-year history of smoking and currently smoke or have quit within the past 15 years. Yearly screening is stopped once you have quit smoking for at least 15 years or develop a health problem that would prevent you from having lung cancer treatment.  Clinical breast exam.** / Every year after age 40 years.  BRCA-related cancer risk assessment.** / For women who have family members with a BRCA-related cancer (breast, ovarian, tubal, or peritoneal cancers).  Mammogram.** / Every year beginning at age 40 years and continuing for as long as you are in good health. Consult with your health care provider.  Pap test.** / Every 3 years starting at age 30 years through age 65 or 70 years with a history of 3 consecutive normal Pap tests.  HPV screening.** / Every 3 years from ages 30 years through ages 65 to 70 years with a history of 3 consecutive normal Pap tests.  Fecal occult blood test (FOBT) of stool. / Every year beginning at age 50 years and continuing until age 75 years. You may not need to do this test if you get a colonoscopy every 10 years.  Flexible sigmoidoscopy or colonoscopy.** / Every 5 years for a flexible sigmoidoscopy or every 10 years for a colonoscopy beginning at age 50 years and continuing until age 75 years.  Hepatitis C blood test.** / For all people born from 1945 through  1965 and any individual with known risks for hepatitis C.  Skin self-exam. / Monthly.  Influenza vaccine. / Every year.  Tetanus, diphtheria, and acellular pertussis (Tdap/Td) vaccine.** / Consult your health care provider. Pregnant women should receive 1 dose of Tdap vaccine during each pregnancy. 1 dose of Td every 10 years.  Varicella vaccine.** / Consult your health care provider. Pregnant females who do not have evidence of immunity should receive the first dose after pregnancy.  Zoster vaccine.** / 1 dose for adults aged 60 years or older.  Measles, mumps, rubella (MMR) vaccine.** / You need at least 1 dose of MMR if you were born in 1957 or later. You may also need a 2nd dose. For females of childbearing age, rubella immunity should be determined. If there is no evidence of immunity, females who are not pregnant should be vaccinated. If there is no evidence of immunity, females who are pregnant should delay immunization until after pregnancy.  Pneumococcal 13-valent conjugate (PCV13) vaccine.** / Consult your health care provider.  Pneumococcal polysaccharide (PPSV23) vaccine.** / 1 to 2 doses if you smoke cigarettes or if you have certain conditions.  Meningococcal vaccine.** / Consult your health care provider.  Hepatitis A vaccine.** / Consult your health care provider.  Hepatitis B vaccine.** / Consult your health care provider.  Haemophilus influenzae type b (Hib) vaccine.** / Consult your health care provider. Ages 65   years and over  Blood pressure check.** / Every 1 to 2 years.  Lipid and cholesterol check.** / Every 5 years beginning at age 22 years.  Lung cancer screening. / Every year if you are aged 73-80 years and have a 30-pack-year history of smoking and currently smoke or have quit within the past 15 years. Yearly screening is stopped once you have quit smoking for at least 15 years or develop a health problem that would prevent you from having lung cancer  treatment.  Clinical breast exam.** / Every year after age 4 years.  BRCA-related cancer risk assessment.** / For women who have family members with a BRCA-related cancer (breast, ovarian, tubal, or peritoneal cancers).  Mammogram.** / Every year beginning at age 40 years and continuing for as long as you are in good health. Consult with your health care provider.  Pap test.** / Every 3 years starting at age 9 years through age 34 or 91 years with 3 consecutive normal Pap tests. Testing can be stopped between 65 and 70 years with 3 consecutive normal Pap tests and no abnormal Pap or HPV tests in the past 10 years.  HPV screening.** / Every 3 years from ages 57 years through ages 64 or 45 years with a history of 3 consecutive normal Pap tests. Testing can be stopped between 65 and 70 years with 3 consecutive normal Pap tests and no abnormal Pap or HPV tests in the past 10 years.  Fecal occult blood test (FOBT) of stool. / Every year beginning at age 15 years and continuing until age 17 years. You may not need to do this test if you get a colonoscopy every 10 years.  Flexible sigmoidoscopy or colonoscopy.** / Every 5 years for a flexible sigmoidoscopy or every 10 years for a colonoscopy beginning at age 86 years and continuing until age 71 years.  Hepatitis C blood test.** / For all people born from [birth year] through 1966 and any individual with known risks for hepatitis C.  Osteoporosis screening.** / A one-time screening for women ages 83 years and over and women at risk for fractures or osteoporosis.  Skin self-exam. / Monthly.  Influenza vaccine. / Every year.  Tetanus, diphtheria, and acellular pertussis (Tdap/Td) vaccine.** / 1 dose of Td every 10 years.  Varicella vaccine.** / Consult your health care provider.  Zoster vaccine.** / 1 dose for adults aged 61 years or older.  Pneumococcal 13-valent conjugate (PCV13) vaccine.** / Consult your health care provider.  Pneumococcal  polysaccharide (PPSV23) vaccine.** / 1 dose for all adults aged 28 years and older.  Meningococcal vaccine.** / Consult your health care provider.  Hepatitis A vaccine.** / Consult your health care provider.  Hepatitis B vaccine.** / Consult your health care provider.  Haemophilus influenzae type b (Hib) vaccine.** / Consult your health care provider. ** Family history and personal history of risk and conditions may change your health care provider's recommendations. Document Released: 04/09/2001 Document Revised: 06/28/2013 Document Reviewed: 07/09/2010 [facility] Patient Information 2015 [place], [place]. This information is not intended to replace advice given to you by your health care provider. Make sure you discuss any questions you have with your health care provider.

## 2014-08-08 NOTE — Progress Notes (Signed)
GYNECOLOGY CLINIC ANNUAL PREVENTATIVE CARE ENCOUNTER NOTE  Subjective:   JREAM BROYLES is a 49 y.o. G5P3 female s/p TAH in 2006 for fibroids here for a routine annual gynecologic exam.  Current complaints: dyspareunia x 1 episode about one month ago and increased urinary frequency for the past 2 days.  Dyspareunia occurred during intercourse, felt her partner "was hitting something". Only happened once. Has been with same partner for years. Does not feel she was adequately lubricated.    Also reports increased urinary frequency for 2 days, no associated dysuria, fevers, pain or other symptoms. Reports passage of clear urine.     Denies abnormal vaginal bleeding, discharge, or other gynecologic concerns.  Desires preventative healthcare maintenance labs; she came in fasting today for these labs.   Gynecologic History No LMP recorded. Patient has had a hysterectomy. Contraception: status post hysterectomy Last mammogram: 09/03/2013. Results were: normal  Obstetric History OB History  Gravida Para Term Preterm AB SAB TAB Ectopic Multiple Living  5 3        2     # Outcome Date GA Lbr Len/2nd Weight Sex Delivery Anes PTL Lv  5 Gravida           4 Gravida           3 Para           2 Para           1 Para               Past Medical History  Diagnosis Date  . Bronchitis   . Hypothyroid     Past Surgical History  Procedure Laterality Date  . Total abdominal hysterectomy  2006    Fibroids, benign pathology  . Cholecystectomy    . Wrist arthroscopy Right   . Dilation and curettage of uterus      X 2    No current outpatient prescriptions on file prior to visit.   No current facility-administered medications on file prior to visit.    No Known Allergies  History   Social History  . Marital Status: Single    Spouse Name: N/A  . Number of Children: N/A  . Years of Education: N/A   Occupational History  . Not on file.   Social History Main Topics  . Smoking  status: Never Smoker   . Smokeless tobacco: Never Used  . Alcohol Use: No  . Drug Use: No  . Sexual Activity:    Partners: Male    Birth Control/ Protection: Surgical     Comment: hysterectomy.   Other Topics Concern  . Not on file   Social History Narrative    Family History  Problem Relation Age of Onset  . Breast cancer Maternal Aunt   . Hypertension Maternal Grandmother   . Hypertension Mother   . Heart failure Maternal Grandmother   . Lung cancer Maternal Grandmother     The following portions of the patient's history were reviewed and updated as appropriate: allergies, current medications, past family history, past medical history, past social history, past surgical history and problem list.  Review of Systems Pertinent items are noted in HPI.   Objective:   BP 148/93 mmHg  Pulse 83  Ht 5\' 6"  (1.676 m)  Wt 164 lb (74.39 kg)  BMI 26.48 kg/m2 CONSTITUTIONAL: Well-developed, well-nourished female in no acute distress.  HENT:  Normocephalic, atraumatic, External right and left ear normal. Oropharynx is clear and moist EYES: Conjunctivae and  EOM are normal. Pupils are equal, round, and reactive to light. No scleral icterus.  NECK: Normal range of motion, supple, no masses.  Normal thyroid.  SKIN: Skin is warm and dry. No rash noted. Not diaphoretic. No erythema. No pallor. [place]: Alert and oriented to person, place, and time. Normal reflexes, muscle tone coordination. No cranial nerve deficit noted. PSYCHIATRIC: Normal mood and affect. Normal behavior. Normal judgment and thought content. CARDIOVASCULAR: Normal heart rate noted, regular rhythm RESPIRATORY: Clear to auscultation bilaterally. Effort and breath sounds normal, no problems with respiration noted. BREASTS: Symmetric in size. No masses, skin changes, nipple drainage, or lymphadenopathy. ABDOMEN: Soft, normal bowel sounds, no distention noted.  No tenderness, rebound or guarding.  PELVIC: Normal appearing  external genitalia; normal appearing vaginal mucosa and vaginal cuff.  Moderate thick, white vaginal discharge noted, wet prep obtained.  No other palpable masses, no adnexal tenderness. MUSCULOSKELETAL: Normal range of motion. No tenderness.  No cyanosis, clubbing, or edema.  2+ distal pulses.  Clinic Urinalysis: Negative for LE, protein, RBC, nitrites  Assessment:   Annual gynecologic examination and preventative health care maintenance exam Dyspareunia Increased urinary frequency   Plan:  Will follow up results of pap smear and manage accordingly. Mammogram to be scheduled by patient Preventative health care labs (CBC, BMP, TSH, lipid panel, TSH) drawn as per patient request For the dyspareunia, will follow up wet prep and manage accordingly.  Recommended water based lubrication during intercourse. She will let us know if symptoms persist or get worse. For increased urinary frequency, culture sent, will follow up results and manage accordingly. Routine preventative health maintenance measures emphasized. Please refer to After Visit Summary for other counseling recommendations.    Verita Schneiders, MD, [place] Attending [place] for [employer], [place]

## 2014-08-09 LAB — BASIC METABOLIC PANEL
BUN: 10 mg/dL (ref 6–23)
CALCIUM: 9.7 mg/dL (ref 8.4–10.5)
CHLORIDE: 97 meq/L (ref 96–112)
CO2: 31 meq/L (ref 19–32)
CREATININE: 0.7 mg/dL (ref 0.50–1.10)
Glucose, Bld: 90 mg/dL (ref 70–99)
POTASSIUM: 4.3 meq/L (ref 3.5–5.3)
Sodium: 136 mEq/L (ref 135–145)

## 2014-08-09 LAB — CBC
HCT: 44 % (ref 36.0–46.0)
Hemoglobin: 14.3 g/dL (ref 12.0–15.0)
MCH: 28.3 pg (ref 26.0–34.0)
MCHC: 32.5 g/dL (ref 30.0–36.0)
MCV: 87 fL (ref 78.0–100.0)
MPV: 10.7 fL (ref 8.6–12.4)
Platelets: 258 10*3/uL (ref 150–400)
RBC: 5.06 MIL/uL (ref 3.87–5.11)
RDW: 14.6 % (ref 11.5–15.5)
WBC: 4.5 10*3/uL (ref 4.0–10.5)

## 2014-08-09 LAB — WET PREP, GENITAL
Trich, Wet Prep: NONE SEEN
Yeast Wet Prep HPF POC: NONE SEEN

## 2014-08-09 LAB — LIPID PANEL
Cholesterol: 209 mg/dL — ABNORMAL HIGH (ref 0–200)
HDL: 56 mg/dL (ref >=46)
LDL CALC: 121 mg/dL — AB (ref 0–99)
TRIGLYCERIDES: 161 mg/dL — AB (ref <150)
Total CHOL/HDL Ratio: 3.7 Ratio
VLDL: 32 mg/dL (ref 0–40)

## 2014-08-09 LAB — TSH: TSH: 3.046 u[IU]/mL (ref 0.350–4.500)

## 2014-08-09 LAB — HEMOGLOBIN A1C
Hgb A1c MFr Bld: 5.6 % (ref <5.7)
Mean Plasma Glucose: 114 mg/dL (ref <117)

## 2014-08-09 MED ORDER — METRONIDAZOLE 500 MG PO TABS: 500.0000 mg | ORAL_TABLET | Freq: Two times a day (BID) | ORAL | Status: DC

## 2014-08-09 NOTE — Addendum Note (Signed)
Addended by: Verita Schneiders A on: 08/09/2014 03:08 PM   Modules accepted: Orders

## 2014-08-10 LAB — URINE CULTURE
COLONY COUNT: NO GROWTH
Organism ID, Bacteria: NO GROWTH

## 2014-08-17 ENCOUNTER — Other Ambulatory Visit: Payer: Self-pay | Admitting: Obstetrics & Gynecology

## 2014-08-17 DIAGNOSIS — Z1231 Encounter for screening mammogram for malignant neoplasm of breast: Secondary | ICD-10-CM

## 2014-08-22 ENCOUNTER — Ambulatory Visit
Admission: RE | Admit: 2014-08-22 | Discharge: 2014-08-22 | Disposition: A | Discharge status: Home / Self Care | Payer: Managed Care, Other (non HMO) | Source: Ambulatory Visit | Attending: Obstetrics & Gynecology | Admitting: Obstetrics & Gynecology

## 2014-08-22 DIAGNOSIS — Z1231 Encounter for screening mammogram for malignant neoplasm of breast: Secondary | ICD-10-CM

## 2014-08-23 ENCOUNTER — Telehealth: Payer: Self-pay | Admitting: *Deleted

## 2014-08-23 DIAGNOSIS — B379 Candidiasis, unspecified: Secondary | ICD-10-CM

## 2014-08-23 MED ORDER — FLUCONAZOLE 150 MG PO TABS: 150.0000 mg | ORAL_TABLET | Freq: Once | ORAL | Status: DC

## 2014-08-23 NOTE — Telephone Encounter (Signed)
Patient called and has a yeast infection from taking the Flagyl.  I have sent in Diflucan.

## 2014-10-14 ENCOUNTER — Encounter: Payer: Self-pay | Admitting: Obstetrics & Gynecology

## 2014-10-14 ENCOUNTER — Ambulatory Visit (INDEPENDENT_AMBULATORY_CARE_PROVIDER_SITE_OTHER): Payer: Managed Care, Other (non HMO) | Admitting: Obstetrics & Gynecology

## 2014-10-14 VITALS — BP 154/90 | HR 88 | Temp 97.7°F | Resp 18 | Ht 66.0 in | Wt 163.0 lb

## 2014-10-14 DIAGNOSIS — R309 Painful micturition, unspecified: Secondary | ICD-10-CM | POA: Diagnosis not present

## 2014-10-14 DIAGNOSIS — B379 Candidiasis, unspecified: Secondary | ICD-10-CM

## 2014-10-14 LAB — POCT URINALYSIS DIPSTICK
BILIRUBIN UA: NEGATIVE
GLUCOSE UA: NEGATIVE
KETONES UA: NEGATIVE
Nitrite, UA: NEGATIVE
PH UA: 6
Protein, UA: NEGATIVE
SPEC GRAV UA: 1.01
Urobilinogen, UA: 0.2

## 2014-10-14 MED ORDER — FLUCONAZOLE 150 MG PO TABS: 150.0000 mg | ORAL_TABLET | Freq: Once | ORAL | Status: DC

## 2014-10-14 MED ORDER — CIPROFLOXACIN HCL 500 MG PO TABS: 500.0000 mg | ORAL_TABLET | Freq: Two times a day (BID) | ORAL | Status: DC

## 2014-10-14 MED ORDER — PHENAZOPYRIDINE HCL 200 MG PO TABS: 200.0000 mg | ORAL_TABLET | Freq: Three times a day (TID) | ORAL | Status: DC | PRN

## 2014-10-14 NOTE — Progress Notes (Signed)
   CLINIC ENCOUNTER NOTE  History:  49 y.o. G5P3 here today for dysuria and lower abdominal pain x 1 week. She denies any abnormal vaginal discharge, bleeding or other concerns.  Denies any fever, back pain, nausea, vomiting or other systemic concerns.  Past Medical History  Diagnosis Date  . Bronchitis   . Hypothyroid     Past Surgical History  Procedure Laterality Date  . Total abdominal hysterectomy  2006    Fibroids, benign pathology  . Cholecystectomy    . Wrist arthroscopy Right   . Dilation and curettage of uterus      X 2    The following portions of the patient's history were reviewed and updated as appropriate: allergies, current medications, past family history, past medical history, past social history, past surgical history and problem list.   Health Maintenance:  She is s/p hysterectomy in 2006 for fibroids.  Normal mammogram on 08/22/14.   Review of Systems:  Pertinent items are noted in HPI. Comprehensive review of systems was otherwise negative.  Objective:  Physical Exam BP 154/90 mmHg  Pulse 88  Temp(Src) 97.7 F (36.5 C)  Resp 18  Ht 5\' 6"  (1.676 m)  Wt 163 lb (73.936 kg)  BMI 26.32 kg/m2 CONSTITUTIONAL: Well-developed, well-nourished female in no acute distress.  HENT:  Normocephalic, atraumatic. External right and left ear normal. Oropharynx is clear and moist EYES: Conjunctivae and EOM are normal. Pupils are equal, round, and reactive to light. No scleral icterus.  NECK: Normal range of motion, supple, no masses SKIN: Skin is warm and dry. No rash noted. Not diaphoretic. No erythema. No pallor. [place]: Alert and oriented to person, place, and time. Normal reflexes, muscle tone coordination. No cranial nerve deficit noted. PSYCHIATRIC: Normal mood and affect. Normal behavior. Normal judgment and thought content. CARDIOVASCULAR: Normal heart rate noted RESPIRATORY: Effort and breath sounds normal, no problems with respiration noted ABDOMEN:  Soft, no distention noted, moderate suprapubic tenderness.   BACK: No CVAT PELVIC: Deferred MUSCULOSKELETAL: Normal range of motion. No edema noted.  Labs and Imaging Results for orders placed or performed in visit on 10/14/14 (from the past 24 hour(s))  POCT Urinalysis Dipstick     Status: Abnormal   Collection Time: 10/14/14  9:22 AM  Result Value Ref Range   Color, UA Yellow    Clarity, UA Cloudy    Glucose, UA Neg    Bilirubin, UA Neg    Ketones, UA Neg    Spec Grav, UA 1.010    Blood, UA trace    pH, UA 6.0    Protein, UA Neg    Urobilinogen, UA 0.2    Nitrite, UA Neg    Leukocytes, UA moderate (2+) (A) Negative  Urine culture sent  Assessment & Plan:  Given symptoms and equivocal UA, will proceed with treatment -> Ciprofloxacin and Pyridium ordered. Also gave Diflucan as per patient's request as she reports having rebound yeast infections s/p antibiotics Will follow up results of urine culture and manage accordingly. Follow up with PCP for elevated BP. Routine preventative health maintenance measures emphasized. Please refer to After Visit Summary for other counseling recommendations.   Verita Schneiders, MD, [place] Attending Obstetrician & Gynecologist, [place] for [facility]

## 2014-10-17 LAB — CULTURE, URINE COMPREHENSIVE: Colony Count: >=100000

## 2014-11-29 DIAGNOSIS — G8929 Other chronic pain: Secondary | ICD-10-CM | POA: Insufficient documentation

## 2014-11-29 DIAGNOSIS — M25511 Pain in right shoulder: Secondary | ICD-10-CM | POA: Insufficient documentation

## 2014-12-27 DIAGNOSIS — M7541 Impingement syndrome of right shoulder: Secondary | ICD-10-CM | POA: Insufficient documentation

## 2014-12-27 DIAGNOSIS — M7542 Impingement syndrome of left shoulder: Secondary | ICD-10-CM | POA: Insufficient documentation

## 2015-02-28 DIAGNOSIS — Z9889 Other specified postprocedural states: Secondary | ICD-10-CM | POA: Insufficient documentation

## 2015-02-28 HISTORY — DX: Other specified postprocedural states: Z98.890

## 2015-04-18 ENCOUNTER — Other Ambulatory Visit: Payer: Self-pay | Admitting: Orthopedic Surgery

## 2015-04-18 DIAGNOSIS — M25511 Pain in right shoulder: Secondary | ICD-10-CM

## 2015-04-18 DIAGNOSIS — S4991XA Unspecified injury of right shoulder and upper arm, initial encounter: Secondary | ICD-10-CM | POA: Insufficient documentation

## 2015-04-25 ENCOUNTER — Ambulatory Visit
Admission: RE | Admit: 2015-04-25 | Discharge: 2015-04-25 | Disposition: A | Discharge status: Home / Self Care | Payer: Managed Care, Other (non HMO) | Source: Ambulatory Visit | Attending: Orthopedic Surgery | Admitting: Orthopedic Surgery

## 2015-04-25 DIAGNOSIS — M25511 Pain in right shoulder: Secondary | ICD-10-CM

## 2015-04-26 ENCOUNTER — Inpatient Hospital Stay
Admission: RE | Admit: 2015-04-26 | Discharge: 2015-04-26 | Disposition: A | Discharge status: Home / Self Care | Payer: Managed Care, Other (non HMO) | Source: Ambulatory Visit | Attending: Orthopedic Surgery | Admitting: Orthopedic Surgery

## 2015-08-10 ENCOUNTER — Encounter: Payer: Self-pay | Admitting: Obstetrics and Gynecology

## 2015-08-10 ENCOUNTER — Ambulatory Visit (INDEPENDENT_AMBULATORY_CARE_PROVIDER_SITE_OTHER): Payer: Managed Care, Other (non HMO) | Admitting: Obstetrics and Gynecology

## 2015-08-10 VITALS — BP 147/90 | HR 69 | Resp 18 | Ht 66.0 in | Wt 171.0 lb

## 2015-08-10 DIAGNOSIS — R102 Pelvic and perineal pain: Secondary | ICD-10-CM | POA: Diagnosis not present

## 2015-08-10 DIAGNOSIS — Z01419 Encounter for gynecological examination (general) (routine) without abnormal findings: Secondary | ICD-10-CM

## 2015-08-10 LAB — CBC
HCT: 40 % (ref 35.0–45.0)
Hemoglobin: 12.9 g/dL (ref 11.7–15.5)
MCH: 28.2 pg (ref 27.0–33.0)
MCHC: 32.3 g/dL (ref 32.0–36.0)
MCV: 87.3 fL (ref 80.0–100.0)
MPV: 9.5 fL (ref 7.5–12.5)
PLATELETS: 243 10*3/uL (ref 140–400)
RBC: 4.58 MIL/uL (ref 3.80–5.10)
RDW: 14.6 % (ref 11.0–15.0)
WBC: 4.4 10*3/uL (ref 3.8–10.8)

## 2015-08-10 NOTE — Progress Notes (Signed)
Pt here today for annual exam, c/o occasional mid lower pelvic pain.

## 2015-08-10 NOTE — Progress Notes (Signed)
  Subjective:     Ashley [name] is a 50 y.o. female G18P3 with BMI 27 who is here for a comprehensive physical exam. The patient reports no problems. She denies any dyspareunia. She denies urinary symptoms. She is sexually active without concerns. She desires fasting labs done today   Past Medical History  Diagnosis Date  . Bronchitis   . Hypothyroid    Past Surgical History  Procedure Laterality Date  . Total abdominal hysterectomy  2006    Fibroids, benign pathology  . Cholecystectomy    . Wrist arthroscopy Right   . Dilation and curettage of uterus      X 2   Family History  Problem Relation Age of Onset  . Breast cancer Maternal Aunt   . Hypertension Maternal Grandmother   . Hypertension Mother   . Heart failure Maternal Grandmother   . Lung cancer Maternal Grandmother     Social History   Social History  . Marital Status: Single    Spouse Name: N/A  . Number of Children: N/A  . Years of Education: N/A   Occupational History  . Not on file.   Social History Main Topics  . Smoking status: Never Smoker   . Smokeless tobacco: Never Used  . Alcohol Use: No  . Drug Use: No  . Sexual Activity:    Partners: Male    Birth Control/ Protection: Surgical     Comment: hysterectomy.   Other Topics Concern  . Not on file   Social History Narrative   Health Maintenance  Topic Date Due  . Samul Dada  02/22/1984  . PAP SMEAR  02/21/1986  . COLONOSCOPY  02/22/2015  . INFLUENZA VACCINE  09/26/2015  . MAMMOGRAM  08/21/2016  . HIV Screening  Completed     Review of Systems Pertinent items are noted in HPI.   Objective:      GENERAL: Well-developed, well-nourished female in no acute distress.  HEENT: Normocephalic, atraumatic. Sclerae anicteric.  NECK: Supple. Normal thyroid.  LUNGS: Clear to auscultation bilaterally.  HEART: Regular rate and rhythm. BREASTS: Symmetric in size. No palpable masses or lymphadenopathy, skin changes, or nipple  drainage. ABDOMEN: Soft, nontender, nondistended. No organomegaly. PELVIC: Normal external female genitalia. Vagina is pink and rugated.  Normal discharge. No adnexal mass or tenderness. EXTREMITIES: No cyanosis, clubbing, or edema, 2+ distal pulses.    Assessment:    Healthy female exam.      Plan:     Patient is due for screening mammogram which she knows needs to be scheduled TSH, lipid profile and hgA1c ordered Patient will be contacted with any abnormal results RTC prn See After Visit Summary for Counseling Recommendations

## 2015-08-11 LAB — BASIC METABOLIC PANEL
BUN: 8 mg/dL (ref 7–25)
CALCIUM: 9.7 mg/dL (ref 8.6–10.4)
CHLORIDE: 103 mmol/L (ref 98–110)
CO2: 25 mmol/L (ref 20–31)
Creat: 0.67 mg/dL (ref 0.50–1.05)
Glucose, Bld: 86 mg/dL (ref 65–99)
Potassium: 4.3 mmol/L (ref 3.5–5.3)
SODIUM: 138 mmol/L (ref 135–146)

## 2015-08-11 LAB — HEMOGLOBIN A1C
Hgb A1c MFr Bld: 5.6 % (ref <5.7)
Mean Plasma Glucose: 114 mg/dL

## 2015-08-11 LAB — LIPID PANEL
Cholesterol: 218 mg/dL — ABNORMAL HIGH (ref 125–200)
HDL: 69 mg/dL (ref >=46)
LDL CALC: 134 mg/dL — AB (ref <130)
Total CHOL/HDL Ratio: 3.2 Ratio (ref <=5.0)
Triglycerides: 73 mg/dL (ref <150)
VLDL: 15 mg/dL (ref <30)

## 2015-08-11 LAB — TSH: TSH: 3.58 mIU/L

## 2015-08-14 ENCOUNTER — Telehealth: Payer: Self-pay | Admitting: *Deleted

## 2015-08-14 NOTE — Telephone Encounter (Signed)
-----   Message from Mora Bellman, MD sent at 08/14/2015  3:45 PM EDT ----- Please inform the patient of elevated cholesterol. Although it has not reached values where medications are required, it would benefit her to change her lifestyle. She should exercise regularly, at least 150 minutes per week and consume a high fiber, low fat diet  Thanks  Peggy

## 2015-08-14 NOTE — Telephone Encounter (Signed)
Called pt to go over lab results. LM for her to rtn call.

## 2015-08-30 ENCOUNTER — Other Ambulatory Visit (HOSPITAL_COMMUNITY): Payer: Self-pay | Admitting: Obstetrics & Gynecology

## 2015-08-30 ENCOUNTER — Other Ambulatory Visit: Payer: Self-pay | Admitting: Obstetrics & Gynecology

## 2015-08-30 DIAGNOSIS — Z1231 Encounter for screening mammogram for malignant neoplasm of breast: Secondary | ICD-10-CM

## 2015-09-05 ENCOUNTER — Ambulatory Visit
Admission: RE | Admit: 2015-09-05 | Discharge: 2015-09-05 | Disposition: A | Discharge status: Home / Self Care | Payer: Managed Care, Other (non HMO) | Source: Ambulatory Visit | Attending: Obstetrics & Gynecology | Admitting: Obstetrics & Gynecology

## 2015-09-05 DIAGNOSIS — Z1231 Encounter for screening mammogram for malignant neoplasm of breast: Secondary | ICD-10-CM

## 2016-01-10 ENCOUNTER — Other Ambulatory Visit: Payer: Self-pay | Admitting: Obstetrics & Gynecology

## 2016-01-10 DIAGNOSIS — R309 Painful micturition, unspecified: Secondary | ICD-10-CM

## 2016-01-10 DIAGNOSIS — B373 Candidiasis of vulva and vagina: Secondary | ICD-10-CM

## 2016-01-10 DIAGNOSIS — B3731 Acute candidiasis of vulva and vagina: Secondary | ICD-10-CM

## 2016-01-10 DIAGNOSIS — N898 Other specified noninflammatory disorders of vagina: Secondary | ICD-10-CM

## 2016-01-23 ENCOUNTER — Encounter: Payer: Self-pay | Admitting: Family Medicine

## 2016-01-23 ENCOUNTER — Ambulatory Visit (INDEPENDENT_AMBULATORY_CARE_PROVIDER_SITE_OTHER): Payer: Managed Care, Other (non HMO) | Admitting: Family Medicine

## 2016-01-23 VITALS — BP 153/91 | HR 73 | Resp 18 | Ht 66.0 in | Wt 169.0 lb

## 2016-01-23 DIAGNOSIS — R102 Pelvic and perineal pain: Secondary | ICD-10-CM

## 2016-01-23 LAB — POCT URINALYSIS DIPSTICK
Bilirubin, UA: NEGATIVE
GLUCOSE UA: NEGATIVE
KETONES UA: NEGATIVE
Nitrite, UA: NEGATIVE
Protein, UA: NEGATIVE
RBC UA: NEGATIVE
SPEC GRAV UA: 1.02
UROBILINOGEN UA: 0.2
pH, UA: 6

## 2016-01-23 NOTE — Patient Instructions (Signed)
Acute Urinary Retention, Female Urinary retention means you are unable to pee completely or at all (empty your bladder). Follow these instructions at home:  Drink enough fluids to keep your pee (urine) clear or pale yellow.  If you are sent home with a tube that drains the bladder (catheter), there will be a drainage bag attached to it. There are two types of bags. One is big that you can wear at night without having to empty it. One is smaller and needs to be emptied more often.  Keep the drainage bag emptied.  Keep the drainage bag lower than the tube.  Only take medicine as told by your doctor. Contact a doctor if:  You have a low-grade fever.  You have spasms or you are leaking pee when you have spasms. Get help right away if:  You have chills or a fever.  Your catheter stops draining pee.  Your catheter falls out.  You have increased bleeding that does not stop after you have rested and increased the amount of fluids you had been drinking. This information is not intended to replace advice given to you by your health care provider. Make sure you discuss any questions you have with your health care provider. Document Released: 07/31/2007 Document Revised: 07/20/2015 Document Reviewed: 07/23/2012 Elsevier Interactive Patient Education  2017 [employer]vier Inc[employer]  

## 2016-01-23 NOTE — Progress Notes (Signed)
   CLINIC ENCOUNTER NOTE  History:  49 y.o. Ashley [name] here today for pelvic pain- worse 2 weeks ago, sharp and felt like pressure. Drinking water and cranberry juice to help sx. They have reduced and are improving. Her sx are worse with sex and urination. She denies any abnormal vaginal discharge, bleeding, pelvic pain or other concerns.   Past Medical History:  Diagnosis Date  . Bronchitis   . Hypothyroid     Past Surgical History:  Procedure Laterality Date  . CHOLECYSTECTOMY    . DILATION AND CURETTAGE OF UTERUS     X 2  . TOTAL ABDOMINAL HYSTERECTOMY  2006   Fibroids, benign pathology  . WRIST ARTHROSCOPY Right     The following portions of the patient's history were reviewed and updated as appropriate: allergies, current medications, past family history, past medical history, past social history, past surgical history and problem list.   Health Maintenance:  S/p TAH.  Normal mammogram on 08/2015   Review of Systems:  Pertinent items noted in HPI and remainder of comprehensive ROS otherwise negative.   Objective:  Physical Exam BP (!) 153/91 (BP Location: Left Arm, Patient Position: Sitting, Cuff Size: Normal)   Pulse 73   Resp 18   Ht 5\' 6"  (1.676 m)   Wt 169 lb (76.7 kg)   BMI 27.28 kg/m  CONSTITUTIONAL: Well-developed, well-nourished female in no acute distress.  HENT:  Normocephalic, atraumatic. External right and left ear normal. Oropharynx is clear and moist EYES: Conjunctivae and EOM are normal. Pupils are equal, round, and reactive to light. No scleral icterus.  NECK: Normal range of motion, supple, no masses SKIN: Skin is warm and dry. No rash noted. Not diaphoretic. No erythema. No pallor. [place]: Alert and oriented to person, place, and time. Normal reflexes, muscle tone coordination. No cranial nerve deficit noted. PSYCHIATRIC: Normal mood and affect. Normal behavior. Normal judgment and thought content. CARDIOVASCULAR: Normal heart rate noted RESPIRATORY:  Effort and breath sounds normal, no problems with respiration noted ABDOMEN: Soft, no distention noted.   PELVIC: Normal appearing external genitalia; normal appearing vaginal mucosa and cervix.  No abnormal discharge noted.  Normal uterine size, no other palpable masses, no uterine or adnexal tenderness. MUSCULOSKELETAL: Normal range of motion. No edema noted.  Labs and Imaging No results found.  Assessment & Plan:  1. Pelvic pain- likely resolving UTI - UCx sent  - POCT Urinalysis Dipstick - Reviewed return precautions in detail and pt voiced understanding.  If Cx pos- would recommend abx and concurrent rx for diflucan as patient gets yeast infections with abx   Routine preventative health maintenance measures emphasized. Please refer to After Visit Summary for other counseling recommendations.   Return if symptoms worsen or fail to improve.  Total face-to-face time with patient: 15 minutes. Over 50% of encounter was spent on counseling and coordination of care.

## 2016-01-23 NOTE — Progress Notes (Signed)
Pt here today c/o mid lower pelvic pain over the past 2 weeks.

## 2016-01-25 LAB — URINE CULTURE: ORGANISM ID, BACTERIA: NO GROWTH

## 2016-01-30 ENCOUNTER — Telehealth: Payer: Self-pay | Admitting: *Deleted

## 2016-01-30 NOTE — Telephone Encounter (Signed)
Called pt, no answer, left message that urine cx was negative and to call the office if she was still having problems.

## 2016-01-30 NOTE — Telephone Encounter (Signed)
-----   Message from Caren Macadam, MD sent at 01/29/2016  9:10 PM EST ----- Urine culture was negative. Please call to follow up on symptoms of UTI.

## 2016-02-07 ENCOUNTER — Encounter: Payer: Self-pay | Admitting: Family Medicine

## 2016-02-07 ENCOUNTER — Ambulatory Visit (INDEPENDENT_AMBULATORY_CARE_PROVIDER_SITE_OTHER): Payer: Managed Care, Other (non HMO) | Admitting: Family Medicine

## 2016-02-07 VITALS — BP 134/90 | HR 87 | Resp 18 | Ht 66.0 in | Wt 170.0 lb

## 2016-02-07 DIAGNOSIS — N898 Other specified noninflammatory disorders of vagina: Secondary | ICD-10-CM

## 2016-02-07 DIAGNOSIS — R102 Pelvic and perineal pain: Secondary | ICD-10-CM | POA: Diagnosis not present

## 2016-02-07 NOTE — Patient Instructions (Signed)
[facility] [phone]    Preventive Care 40-64 Years, Female Preventive care refers to lifestyle choices and visits with your health care provider that can promote health and wellness. What does preventive care include?  A yearly physical exam. This is also called an annual well check.  Dental exams once or twice a year.  Routine eye exams. Ask your health care provider how often you should have your eyes checked.  Personal lifestyle choices, including:  Daily care of your teeth and gums.  Regular physical activity.  Eating a healthy diet.  Avoiding tobacco and drug use.  Limiting alcohol use.  Practicing safe sex.  Taking low-dose aspirin daily starting at age 50.  Taking vitamin and mineral supplements as recommended by your health care provider. What happens during an annual well check? The services and screenings done by your health care provider during your annual well check will depend on your age, overall health, lifestyle risk factors, and family history of disease. Counseling  Your health care provider may ask you questions about your:  Alcohol use.  Tobacco use.  Drug use.  Emotional well-being.  Home and relationship well-being.  Sexual activity.  Eating habits.  Work and work environment.  Method of birth control.  Menstrual cycle.  Pregnancy history. Screening  You may have the following tests or measurements:  Height, weight, and BMI.  Blood pressure.  Lipid and cholesterol levels. These may be checked every 5 years, or more frequently if you are over 50 years old.  Skin check.  Lung cancer screening. You may have this screening every year starting at age 55 if you have a 30-pack-year history of smoking and currently smoke or have quit within the past 15 years.  Fecal occult blood test (FOBT) of the stool. You may have this test every year starting at age 50.  Flexible sigmoidoscopy or colonoscopy. You may have a  sigmoidoscopy every 5 years or a colonoscopy every 10 years starting at age 50.  Hepatitis C blood test.  Hepatitis B blood test.  Sexually transmitted disease (STD) testing.  Diabetes screening. This is done by checking your blood sugar (glucose) after you have not eaten for a while (fasting). You may have this done every 1-3 years.  Mammogram. This may be done every 1-2 years. Talk to your health care provider about when you should start having regular mammograms. This may depend on whether you have a family history of breast cancer.  BRCA-related cancer screening. This may be done if you have a family history of breast, ovarian, tubal, or peritoneal cancers.  Pelvic exam and Pap test. This may be done every 3 years starting at age 21. Starting at age 30, this may be done every 5 years if you have a Pap test in combination with an HPV test.  Bone density scan. This is done to screen for osteoporosis. You may have this scan if you are at high risk for osteoporosis. Discuss your test results, treatment options, and if necessary, the need for more tests with your health care provider. Vaccines  Your health care provider may recommend certain vaccines, such as:  Influenza vaccine. This is recommended every year.  Tetanus, diphtheria, and acellular pertussis (Tdap, Td) vaccine. You may need a Td booster every 10 years.  Varicella vaccine. You may need this if you have not been vaccinated.  Zoster vaccine. You may need this after age 60.  Measles, mumps, and rubella (MMR) vaccine. You may need at least one   dose of MMR if you were born in 1957 or later. You may also need a second dose.  Pneumococcal 13-valent conjugate (PCV13) vaccine. You may need this if you have certain conditions and were not previously vaccinated.  Pneumococcal polysaccharide (PPSV23) vaccine. You may need one or two doses if you smoke cigarettes or if you have certain conditions.  Meningococcal vaccine. You may  need this if you have certain conditions.  Hepatitis A vaccine. You may need this if you have certain conditions or if you travel or work in places where you may be exposed to hepatitis A.  Hepatitis B vaccine. You may need this if you have certain conditions or if you travel or work in places where you may be exposed to hepatitis B.  Haemophilus influenzae type b (Hib) vaccine. You may need this if you have certain conditions. Talk to your health care provider about which screenings and vaccines you need and how often you need them. This information is not intended to replace advice given to you by your health care provider. Make sure you discuss any questions you have with your health care provider. Document Released: 03/10/2015 Document Revised: 11/01/2015 Document Reviewed: 12/13/2014 Elsevier Interactive Patient Education  2017 [employer].

## 2016-02-07 NOTE — Progress Notes (Signed)
Patient ID: Ashley [name], female   DOB: 04-Aug-1965, 50 y.o.   MRN: [id number]   CLINIC ENCOUNTER NOTE  History:  50 y.o. Ashley [name] here today for pelvic pain- seen on 11/28 and was thought to have a resolving UTI.  Now present for about 1 month. At the time of the last visit her sx were overall improving. She reports having increased vaginal discharge for the last several days-- chunky and associated with irritation.   Also reports right sided sharp stabbing pain that started after last visit and is new-- slightly worsening in the last 2 weeks. Worse with movement.   Review of labs-- Urine culture was negative  Past Medical History:  Diagnosis Date  . Bronchitis   . Hypothyroid     Past Surgical History:  Procedure Laterality Date  . CHOLECYSTECTOMY    . DILATION AND CURETTAGE OF UTERUS     X 2  . TOTAL ABDOMINAL HYSTERECTOMY  2006   Fibroids, benign pathology  . WRIST ARTHROSCOPY Right     The following portions of the patient's history were reviewed and updated as appropriate: allergies, current medications, past family history, past medical history, past social history, past surgical history and problem list.   Health Maintenance:  S/p TAH.  Normal mammogram on 08/2015   Review of Systems:  Pertinent items noted in HPI and remainder of comprehensive ROS otherwise negative.   Objective:  Physical Exam BP 134/90 (BP Location: Left Arm, Patient Position: Sitting, Cuff Size: Normal)   Pulse 87   Resp 18   Ht 5\' 6"  (1.676 m)   Wt 170 lb (77.1 kg)   BMI 27.44 kg/m  CONSTITUTIONAL: Well-developed, well-nourished female in no acute distress.  HENT:  Normocephalic, atraumatic. External right and left ear normal. Oropharynx is clear and moist EYES: Conjunctivae and EOM are normal. Pupils are equal, round, and reactive to light. No scleral icterus.  NECK: Normal range of motion, supple, no masses SKIN: Skin is warm and dry. No rash noted. Not diaphoretic. No erythema. No  pallor. [place]: Alert and oriented to person, place, and time. Normal reflexes, muscle tone coordination. No cranial nerve deficit noted. PSYCHIATRIC: Normal mood and affect. Normal behavior. Normal judgment and thought content. CARDIOVASCULAR: Normal heart rate noted RESPIRATORY: Effort and breath sounds normal, no problems with respiration noted ABDOMEN: Soft, no distention noted.   PELVIC: Normal appearing external genitalia; normal appearing vaginal mucosa and cervix.  No abnormal discharge noted.  Normal uterine size, no other palpable masses, no uterine . + Right adnexal tenderness and 1-2 cm mass palpated. MUSCULOSKELETAL: Normal range of motion. No edema noted.  Labs and Imaging No results found.  Assessment & Plan:   1. Pelvic pain Suspect ovarian cyst - US Transvaginal Non-OB; Future - US Pelvis Complete; Future  2. Vaginal discharge No odor, suspect candida. - Wet prep, genital - If + yeast, recommend treatment with clotrimazole 1% vaginal cream applied daily x5 days.  Routine preventative health maintenance measures emphasized. Please refer to After Visit Summary for other counseling recommendations.   Return in about 4 weeks (around 03/06/2016), or if symptoms worsen or fail to improve.  Total face-to-face time with patient: 15 minutes. Over 50% of encounter was spent on counseling and coordination of care.

## 2016-02-07 NOTE — Progress Notes (Signed)
Pt is here today to follow-up with Dr Ernestina Patches for pelvic pain.  States the pain has not changed in intensity or frequency but still is present at times.  Also having a white discharge and vaginal irritation.

## 2016-02-08 LAB — WET PREP, GENITAL
Clue Cells Wet Prep HPF POC: NONE SEEN
Trich, Wet Prep: NONE SEEN
YEAST WET PREP: NONE SEEN

## 2016-02-13 ENCOUNTER — Ambulatory Visit (HOSPITAL_COMMUNITY)
Admission: RE | Admit: 2016-02-13 | Discharge: 2016-02-13 | Disposition: A | Discharge status: Home / Self Care | Payer: Managed Care, Other (non HMO) | Source: Ambulatory Visit | Attending: Family Medicine | Admitting: Family Medicine

## 2016-02-13 DIAGNOSIS — Z90710 Acquired absence of both cervix and uterus: Secondary | ICD-10-CM | POA: Insufficient documentation

## 2016-02-13 DIAGNOSIS — R102 Pelvic and perineal pain: Secondary | ICD-10-CM | POA: Diagnosis present

## 2016-02-13 DIAGNOSIS — N7011 Chronic salpingitis: Secondary | ICD-10-CM | POA: Diagnosis not present

## 2016-02-23 ENCOUNTER — Encounter: Payer: Self-pay | Admitting: Family Medicine

## 2016-02-23 ENCOUNTER — Ambulatory Visit (INDEPENDENT_AMBULATORY_CARE_PROVIDER_SITE_OTHER): Payer: Managed Care, Other (non HMO) | Admitting: Family Medicine

## 2016-02-23 VITALS — BP 143/86 | HR 78 | Ht 66.0 in | Wt 172.0 lb

## 2016-02-23 DIAGNOSIS — R102 Pelvic and perineal pain: Secondary | ICD-10-CM

## 2016-02-23 DIAGNOSIS — N83202 Unspecified ovarian cyst, left side: Secondary | ICD-10-CM

## 2016-02-23 DIAGNOSIS — N898 Other specified noninflammatory disorders of vagina: Secondary | ICD-10-CM | POA: Diagnosis not present

## 2016-02-23 DIAGNOSIS — Z113 Encounter for screening for infections with a predominantly sexual mode of transmission: Secondary | ICD-10-CM

## 2016-02-23 HISTORY — DX: Unspecified ovarian cyst, left side: N83.202

## 2016-02-23 NOTE — Progress Notes (Signed)
CLINIC ENCOUNTER NOTE  History:  50 y.o. G5P3 here today for follow up on lower abdominal pain and pelvic US  She denies any abnormal vaginal discharge, bleeding, pelvic pain or other concerns.   Past Medical History:  Diagnosis Date  . Bronchitis   . Hypothyroid     Past Surgical History:  Procedure Laterality Date  . CHOLECYSTECTOMY    . DILATION AND CURETTAGE OF UTERUS     X 2  . TOTAL ABDOMINAL HYSTERECTOMY  2006   Fibroids, benign pathology  . WRIST ARTHROSCOPY Right     The following portions of the patient's history were reviewed and updated as appropriate: allergies, current medications, past family history, past medical history, past social history, past surgical history and problem list.   Health Maintenance:  Normal pap and negative and UTD. Mamogram UTD  Review of Systems:  Pertinent items noted in HPI and remainder of comprehensive ROS otherwise negative.   Objective:  Physical Exam BP (!) 143/86   Pulse 78   Ht 5\' 6"  (1.676 m)   Wt 172 lb (78 kg)   BMI 27.76 kg/m  CONSTITUTIONAL: Well-developed, well-nourished female in no acute distress.  HENT:  Normocephalic, atraumatic. External right and left ear normal. Oropharynx is clear and moist EYES: Conjunctivae and EOM are normal. Pupils are equal, round, and reactive to light. No scleral icterus.  NECK: Normal range of motion, supple, no masses SKIN: Skin is warm and dry. No rash noted. Not diaphoretic. No erythema. No pallor. [place]: Alert and oriented to person, place, and time. Normal reflexes, muscle tone coordination. No cranial nerve deficit noted. PSYCHIATRIC: Normal mood and affect. Normal behavior. Normal judgment and thought content. CARDIOVASCULAR: Normal heart rate noted RESPIRATORY: Effort and breath sounds normal, no problems with respiration noted ABDOMEN: Soft, no distention noted.   PELVIC: Normal appearing external genitalia; normal appearing vaginal mucosa and cervix.  No abnormal  discharge noted.  Normal uterine size, no other palpable masses, no uterine or adnexal tenderness. MUSCULOSKELETAL: Normal range of motion. No edema noted.  Labs and Imaging US Transvaginal Non-ob  Result Date: 02/14/2016 CLINICAL DATA:  One month of pelvic pain greatest on the right. History of abdominal hysterectomy in 2006 EXAM: [place] OF PELVIS TECHNIQUE: Both transabdominal and transvaginal ultrasound examinations of the pelvis were performed. Transabdominal technique was performed for global imaging of the pelvis including uterus, ovaries, adnexal regions, and pelvic cul-de-sac. It was necessary to proceed with endovaginal exam following the transabdominal exam to visualize the adnexal regions. COMPARISON:  Pelvic ultrasound of December 27, 2013 FINDINGS: Uterus The uterus is surgically absent. Right ovary Measurements: 1.6 x 1.0 x 0.8 cm. Normal appearance of the ovary. There is curvilinear tubular structure consistent with a hydrosalpinx. Left ovary Measurements: 3.0 x 1.8 x 2.9 cm. There is a dominant follicle versus cyst measuring 1.7 x 1.3 x 2.2 cm. Other findings No abnormal free fluid. IMPRESSION: 1. Right-sided hydrosalpinx.  Normal appearance of the ovary. 2. Simple appearing cyst versus dominant follicle in the left ovary measuring 2.2 cm in greatest dimension. 3. The uterus is surgically absent.  There is no free pelvic fluid. Electronically Signed   By: David  Martinique M.D.   On: 02/14/2016 08:02   US Pelvis Complete  Result Date: 02/14/2016 CLINICAL DATA:  One month of pelvic pain greatest on the right. History of abdominal hysterectomy in 2006 EXAM: [place] OF PELVIS TECHNIQUE: Both transabdominal and transvaginal ultrasound examinations of the pelvis were  performed. Transabdominal technique was performed for global imaging of the pelvis including uterus, ovaries, adnexal regions, and pelvic cul-de-sac. It was  necessary to proceed with endovaginal exam following the transabdominal exam to visualize the adnexal regions. COMPARISON:  Pelvic ultrasound of December 27, 2013 FINDINGS: Uterus The uterus is surgically absent. Right ovary Measurements: 1.6 x 1.0 x 0.8 cm. Normal appearance of the ovary. There is curvilinear tubular structure consistent with a hydrosalpinx. Left ovary Measurements: 3.0 x 1.8 x 2.9 cm. There is a dominant follicle versus cyst measuring 1.7 x 1.3 x 2.2 cm. Other findings No abnormal free fluid. IMPRESSION: 1. Right-sided hydrosalpinx.  Normal appearance of the ovary. 2. Simple appearing cyst versus dominant follicle in the left ovary measuring 2.2 cm in greatest dimension. 3. The uterus is surgically absent.  There is no free pelvic fluid. Electronically Signed   By: David  Martinique M.D.   On: 02/14/2016 08:02    Assessment & Plan:   1. Vaginal discharge - GC/Chlamydia Probe Amp  2. Pelvic pain - Improved and intermittent. [place] shwoed small cyst on left which corresponds to my pelvic exam. Disucssed results with patient. No risk of torsion as less than 5 cm - Reviewed return precautions - Recommened routine CRC screening    Routine preventative health maintenance measures emphasized. Please refer to After Visit Summary for other counseling recommendations.   Return if symptoms worsen or fail to improve.   Total face-to-face time with patient: 15 minutes. Over 50% of encounter was spent on counseling and coordination of care.

## 2016-02-24 LAB — GC/CHLAMYDIA PROBE AMP
CT PROBE, AMP APTIMA: NOT DETECTED
GC PROBE AMP APTIMA: NOT DETECTED

## 2016-02-28 ENCOUNTER — Telehealth: Payer: Self-pay | Admitting: *Deleted

## 2016-02-28 NOTE — Telephone Encounter (Signed)
-----   Message from Caren Macadam, MD sent at 02/28/2016  8:52 AM EST ----- Please call patient and let her know her GC/CT were negative.  Thanks Dr. Ernestina Patches

## 2016-02-28 NOTE — Telephone Encounter (Signed)
Called pt, no answer, left VM that labs were negative and to call the office with any questions.

## 2016-05-19 ENCOUNTER — Encounter: Payer: Self-pay | Admitting: *Deleted

## 2016-05-19 ENCOUNTER — Ambulatory Visit
Admission: EM | Admit: 2016-05-19 | Discharge: 2016-05-19 | Disposition: A | Discharge status: Home / Self Care | Payer: Managed Care, Other (non HMO) | Attending: Family Medicine | Admitting: Family Medicine

## 2016-05-19 DIAGNOSIS — R1030 Lower abdominal pain, unspecified: Secondary | ICD-10-CM

## 2016-05-19 DIAGNOSIS — R109 Unspecified abdominal pain: Secondary | ICD-10-CM | POA: Diagnosis not present

## 2016-05-19 LAB — URINALYSIS, COMPLETE (UACMP) WITH MICROSCOPIC: RBC / HPF: NONE SEEN RBC/hpf (ref 0–5)

## 2016-05-19 MED ORDER — CIPROFLOXACIN HCL 500 MG PO TABS: 500.0000 mg | ORAL_TABLET | Freq: Two times a day (BID) | ORAL | 0 refills | Status: AC

## 2016-05-19 MED ORDER — FLUCONAZOLE 150 MG PO TABS: 150.0000 mg | ORAL_TABLET | Freq: Once | ORAL | 0 refills | Status: AC

## 2016-05-19 NOTE — Discharge Instructions (Signed)
Start Cipro 500mg  twice a day as directed. Increase fluid intake and continue to monitor symptoms. May take Diflucan 150mg  1 tablet tomorrow and may repeat 1 tablet at end of antibiotic use to prevent antibiotic-induced yeast infection. Recommend follow-up pending lab results.

## 2016-05-19 NOTE — ED Triage Notes (Signed)
Suprapubic and right flank pain x 3 days. Pt denies other symptoms.

## 2016-05-19 NOTE — ED Provider Notes (Signed)
CSN: [id number]     Arrival date & time 05/19/16  1455 History   First MD Initiated Contact with Patient 05/19/16 1519     Chief Complaint  Patient presents with  . Abdominal Pain  . Flank Pain   (Consider location/radiation/quality/duration/timing/severity/associated sxs/prior Treatment) 51 year old female presents with suprapubic pain that is traveling to the right side of her back that started 3 days ago. Pain is worse when she urinates but no distinct burning present. She denies any fever, unusual discharge, nausea, vomiting or diarrhea. She has taken Advil and AZO with some relief. She has a history of pelvic pain and unusual vaginal discharge about 2 months ago. Had pelvic ultrasound and lab tests for Chlamydia, Gonorrhea and wet prep were all negative. No new partner. She has no history of renal calculi and otherwise no chronic health issues. She takes no daily medication.    The history is provided by the patient.    Past Medical History:  Diagnosis Date  . Bronchitis   . Hypothyroid    Past Surgical History:  Procedure Laterality Date  . CHOLECYSTECTOMY    . DILATION AND CURETTAGE OF UTERUS     X 2  . TOTAL ABDOMINAL HYSTERECTOMY  2006   Fibroids, benign pathology  . WRIST ARTHROSCOPY Right    Family History  Problem Relation Age of Onset  . Hypertension Mother   . Breast cancer Maternal Aunt   . Hypertension Maternal Grandmother   . Heart failure Maternal Grandmother   . Lung cancer Maternal Grandmother    Social History  Substance Use Topics  . Smoking status: Never Smoker  . Smokeless tobacco: Never Used  . Alcohol use No   OB History    Gravida Para Term Preterm AB Living   5 3       2    SAB TAB Ectopic Multiple Live Births                 Review of Systems  Constitutional: Negative for activity change, appetite change, chills, fatigue and fever.  HENT: Negative for congestion and sore throat.   Respiratory: Negative for cough, chest tightness,  shortness of breath and wheezing.   Cardiovascular: Negative for chest pain.  Gastrointestinal: Positive for abdominal pain. Negative for blood in stool, constipation, diarrhea, nausea and vomiting.  Genitourinary: Positive for dysuria, flank pain and frequency. Negative for difficulty urinating, genital sores, hematuria, pelvic pain and vaginal discharge.  Musculoskeletal: Positive for back pain. Negative for arthralgias, myalgias and neck pain.  Skin: Negative for rash and wound.  Allergic/Immunologic: Negative for immunocompromised state.  Neurological: Negative for dizziness, syncope, weakness, light-headedness, numbness and headaches.  Hematological: Negative for adenopathy.    Allergies  Patient has no known allergies.  Home Medications   Prior to Admission medications   Medication Sig Start Date End Date Taking? Authorizing Provider  ciprofloxacin (CIPRO) 500 MG tablet Take 1 tablet (500 mg total) by mouth 2 (two) times daily. 05/19/16 05/26/16  Katy Apo, NP  fluconazole (DIFLUCAN) 150 MG tablet Take 1 tablet (150 mg total) by mouth once. May repeat 1 tablet at end of antibiotic use 05/19/16 05/19/16  Katy Apo, NP   Meds Ordered and Administered this Visit  Medications - No data to display  BP (!) 147/88 (BP Location: Left Arm)   Pulse 84   Temp 98.6 F (37 C) (Oral)   Resp 16   Ht 5\' 6"  (1.676 m)   Wt 170 lb (77.1  kg)   SpO2 98%   BMI 27.44 kg/m  No data found.   Physical Exam  Constitutional: She is oriented to person, place, and time. She appears well-developed and well-nourished. No distress.  HENT:  Head: Normocephalic and atraumatic.  Right Ear: External ear normal.  Left Ear: External ear normal.  Nose: Nose normal.  Mouth/Throat: Oropharynx is clear and moist.  Neck: Normal range of motion. Neck supple.  Cardiovascular: Normal rate, regular rhythm and normal heart sounds.   Pulmonary/Chest: Effort normal and breath sounds normal. No respiratory  distress. She has no wheezes.  Abdominal: Soft. Normal appearance and bowel sounds are normal. There is no hepatosplenomegaly. There is tenderness in the suprapubic area. There is CVA tenderness (right). There is no rigidity, no rebound and no guarding.  Musculoskeletal: Normal range of motion.  Lymphadenopathy:    She has no cervical adenopathy.  Neurological: She is alert and oriented to person, place, and time.  Skin: Skin is warm and dry. Capillary refill takes less than 2 seconds.  Psychiatric: She has a normal mood and affect. Her behavior is normal. Judgment and thought content normal.    Urgent Care Course     Procedures (including critical care time)  Labs Review Labs Reviewed  URINALYSIS, COMPLETE (UACMP) WITH MICROSCOPIC - Abnormal; Notable for the following:       Result Value   Color, Urine ORANGE (*)    Glucose, UA   (*)    Value: TEST NOT REPORTED DUE TO COLOR INTERFERENCE OF URINE PIGMENT   Hgb urine dipstick   (*)    Value: TEST NOT REPORTED DUE TO COLOR INTERFERENCE OF URINE PIGMENT   Bilirubin Urine   (*)    Value: TEST NOT REPORTED DUE TO COLOR INTERFERENCE OF URINE PIGMENT   Ketones, ur   (*)    Value: TEST NOT REPORTED DUE TO COLOR INTERFERENCE OF URINE PIGMENT   Protein, ur   (*)    Value: TEST NOT REPORTED DUE TO COLOR INTERFERENCE OF URINE PIGMENT   Nitrite   (*)    Value: TEST NOT REPORTED DUE TO COLOR INTERFERENCE OF URINE PIGMENT   Leukocytes, UA   (*)    Value: TEST NOT REPORTED DUE TO COLOR INTERFERENCE OF URINE PIGMENT   Squamous Epithelial / LPF 6-30 (*)    Bacteria, UA RARE (*)    All other components within normal limits  URINE CULTURE    Imaging Review No results found.   Visual Acuity Review  Right Eye Distance:   Left Eye Distance:   Bilateral Distance:    Right Eye Near:   Left Eye Near:    Bilateral Near:         MDM   1. Lower abdominal pain   2. Flank pain    Reviewed urinalysis results with patient- due to  interference from AZO- unable to read most of results. Will send urine for culture. Discussed that symptoms may be due to UTI and early pyelonephritis due to CVA tenderness. She may also have a renal stone or other pelvic etiology. Patient declines pelvic exam today. Recommend start Cipro 500mg  twice a day as directed. May take Diflucan 150mg  1 tablet now and may repeat 1 tablet at end of antibiotic use to prevent antibiotic induced yeast infection. Recommend continue to monitor symptoms. Follow-up pending urine culture results and go to ER if symptoms/pain worsen within 48 hours.     Katy Apo, NP 05/19/16 [phone]

## 2016-05-21 LAB — URINE CULTURE: SPECIAL REQUESTS: NORMAL

## 2017-01-21 ENCOUNTER — Encounter: Payer: Self-pay | Admitting: Internal Medicine

## 2017-01-21 ENCOUNTER — Encounter: Payer: Self-pay | Admitting: Obstetrics & Gynecology

## 2017-01-21 ENCOUNTER — Ambulatory Visit (INDEPENDENT_AMBULATORY_CARE_PROVIDER_SITE_OTHER): Payer: BLUE CROSS/BLUE SHIELD | Admitting: Obstetrics & Gynecology

## 2017-01-21 VITALS — BP 125/83 | HR 82 | Ht 66.0 in | Wt 165.0 lb

## 2017-01-21 DIAGNOSIS — R102 Pelvic and perineal pain: Secondary | ICD-10-CM

## 2017-01-21 DIAGNOSIS — Z803 Family history of malignant neoplasm of breast: Secondary | ICD-10-CM

## 2017-01-21 DIAGNOSIS — Z01419 Encounter for gynecological examination (general) (routine) without abnormal findings: Secondary | ICD-10-CM

## 2017-01-21 DIAGNOSIS — Z113 Encounter for screening for infections with a predominantly sexual mode of transmission: Secondary | ICD-10-CM

## 2017-01-21 NOTE — Progress Notes (Signed)
Hysterectomy in 2006 Pt desires Bvag/Cvag testing, BRCA,  and fasting labs. She will RTO when she is fasting for blood draw.

## 2017-01-21 NOTE — Progress Notes (Signed)
Subjective:     Ashley [name] is a 51 y.o. female here for a routine exam.  She is s/p hyst for large uterine fibroids.    Current complaints: Ptt reports a discharge and an odor. She also reports abd pain for 2 months on the right side. The pain is relieved with NSAIDS. It is not incapacitating with annoying. She is not sure if it is related to her ovaries.   She reports a FH of breast CA in an aunt who had breast cancer in her 42s. She also reports other family members with vatries cancers.  She usually gets a mammogram annually.      Gynecologic History No LMP recorded. Patient has had a hysterectomy. Contraception: status post hysterectomy. Fibroids Last Pap: 2006.  Last mammogram: 08/2015.    Obstetric History OB History  Gravida Para Term Preterm AB Living  5 3 3  0 2 3  SAB TAB Ectopic Multiple Live Births  2 0 0 0 3    # Outcome Date GA Lbr Len/2nd Weight Sex Delivery Anes PTL Lv  5 Term      Vag-Spont   LIV  4 Term      CS-Unspec   LIV  3 Term      CS-Unspec   LIV  2 SAB      SAB     1 SAB      SAB        The following portions of the patient's history were reviewed and updated as appropriate: allergies, current medications, past family history, past medical history, past social history, past surgical history and problem list.  Review of Systems Pertinent items are noted in HPI.    Objective:  BP 125/83   Pulse 82   Ht 5\' 6"  (1.676 m)   Wt 165 lb (74.8 kg)   BMI 26.63 kg/m  General Appearance:    Alert, cooperative, no distress, appears stated age  Head:    Normocephalic, without obvious abnormality, atraumatic  Eyes:    conjunctiva/corneas clear, EOM's intact, both eyes  Ears:    Normal external ear canals, both ears  Nose:   Nares normal, septum midline, mucosa normal, no drainage    or sinus tenderness  Throat:   Lips, mucosa, and tongue normal; teeth and gums normal  Neck:   Supple, symmetrical, trachea midline, no adenopathy;    thyroid:  no  enlargement/tenderness/nodules  Back:     Symmetric, no curvature, ROM normal, no CVA tenderness  Lungs:     Clear to auscultation bilaterally, respirations unlabored  Chest Wall:    No tenderness or deformity   Heart:    Regular rate and rhythm, S1 and S2 normal, no murmur, rub   or gallop  Breast Exam:    No tenderness, masses, or nipple abnormality  Abdomen:     Soft, non-tender, bowel sounds active all four quadrants,    no masses, no organomegaly. + tenderness in the LLQ  Genitalia:    Normal female without lesion, discharge or tenderness; there is a small mobile adnexal mass on the left side. This is tender.      Extremities:   Extremities normal, atraumatic, no cyanosis or edema  Pulses:   2+ and symmetric all extremities  Skin:   Skin color, texture, turgor normal, no rashes or lesions     Assessment:    Healthy female exam.   Screening breast ca Pelvic pain and adnexal tenderness Colon cancer screen    Plan:  1. Well woman exam with routine gynecological exam - Lipid panel - CBC - TSH - Hemoglobin A1c - Comprehensive metabolic panel - Cervicovaginal ancillary only - MM DIGITAL SCREENING BILATERAL; Future  2. Pelvic pain -US PELVIS TRANSVANGINAL NON-OB (TV ONLY); Future - US PELVIS (TRANSABDOMINAL ONLY); Future  3. Colon cancer screen - Ambulatory referral to Gastroenterology  F/u in 1 year. Will f/u via MyChart for [place] results.  Jagar Lua L. Harraway-Smith, M.D., [place]lynn June

## 2017-01-22 LAB — CBC
HEMOGLOBIN: 13.5 g/dL (ref 11.1–15.9)
Hematocrit: 40.7 % (ref 34.0–46.6)
MCH: 28.6 pg (ref 26.6–33.0)
MCHC: 33.2 g/dL (ref 31.5–35.7)
MCV: 86 fL (ref 79–97)
PLATELETS: 289 10*3/uL (ref 150–379)
RBC: 4.72 x10E6/uL (ref 3.77–5.28)
RDW: 15.4 % (ref 12.3–15.4)
WBC: 3.7 10*3/uL (ref 3.4–10.8)

## 2017-01-22 LAB — CERVICOVAGINAL ANCILLARY ONLY
BACTERIAL VAGINITIS: POSITIVE — AB
Candida vaginitis: NEGATIVE
Chlamydia: NEGATIVE
NEISSERIA GONORRHEA: NEGATIVE
Trichomonas: NEGATIVE

## 2017-01-22 LAB — COMPREHENSIVE METABOLIC PANEL
ALBUMIN: 4.4 g/dL (ref 3.5–5.5)
ALK PHOS: 88 IU/L (ref 39–117)
ALT: 10 IU/L (ref 0–32)
AST: 22 IU/L (ref 0–40)
Albumin/Globulin Ratio: 1.6 (ref 1.2–2.2)
BUN / CREAT RATIO: 10 (ref 9–23)
BUN: 8 mg/dL (ref 6–24)
Bilirubin Total: 0.2 mg/dL (ref 0.0–1.2)
CO2: 24 mmol/L (ref 20–29)
CREATININE: 0.81 mg/dL (ref 0.57–1.00)
Calcium: 9.4 mg/dL (ref 8.7–10.2)
Chloride: 100 mmol/L (ref 96–106)
GFR, EST AFRICAN AMERICAN: 97 mL/min/{1.73_m2} (ref >59)
GFR, EST NON AFRICAN AMERICAN: 84 mL/min/{1.73_m2} (ref >59)
GLOBULIN, TOTAL: 2.8 g/dL (ref 1.5–4.5)
Glucose: 88 mg/dL (ref 65–99)
Potassium: 4.6 mmol/L (ref 3.5–5.2)
SODIUM: 138 mmol/L (ref 134–144)
TOTAL PROTEIN: 7.2 g/dL (ref 6.0–8.5)

## 2017-01-22 LAB — HEMOGLOBIN A1C
Est. average glucose Bld gHb Est-mCnc: 111 mg/dL
Hgb A1c MFr Bld: 5.5 % (ref 4.8–5.6)

## 2017-01-22 LAB — LIPID PANEL
CHOLESTEROL TOTAL: 214 mg/dL — AB (ref 100–199)
Chol/HDL Ratio: 2.9 ratio (ref 0.0–4.4)
HDL: 73 mg/dL (ref >39)
LDL CALC: 129 mg/dL — AB (ref 0–99)
TRIGLYCERIDES: 58 mg/dL (ref 0–149)
VLDL Cholesterol Cal: 12 mg/dL (ref 5–40)

## 2017-01-22 LAB — TSH: TSH: 1.86 u[IU]/mL (ref 0.450–4.500)

## 2017-01-24 ENCOUNTER — Other Ambulatory Visit: Payer: Self-pay

## 2017-01-24 ENCOUNTER — Ambulatory Visit (HOSPITAL_COMMUNITY)
Admission: RE | Admit: 2017-01-24 | Discharge: 2017-01-24 | Disposition: A | Discharge status: Home / Self Care | Payer: BC Managed Care – PPO | Source: Ambulatory Visit | Attending: Obstetrics & Gynecology | Admitting: Obstetrics & Gynecology

## 2017-01-24 DIAGNOSIS — N83202 Unspecified ovarian cyst, left side: Secondary | ICD-10-CM | POA: Diagnosis not present

## 2017-01-24 DIAGNOSIS — R1032 Left lower quadrant pain: Secondary | ICD-10-CM | POA: Insufficient documentation

## 2017-01-24 DIAGNOSIS — Z01419 Encounter for gynecological examination (general) (routine) without abnormal findings: Secondary | ICD-10-CM | POA: Insufficient documentation

## 2017-01-24 MED ORDER — METRONIDAZOLE 500 MG PO TABS: 500.0000 mg | ORAL_TABLET | Freq: Two times a day (BID) | ORAL | 0 refills | Status: DC

## 2017-01-24 NOTE — Telephone Encounter (Signed)
Patient advised of most recent test result. She also tested positive for BV, FLAGYL sent to her pharmacy.

## 2017-01-28 ENCOUNTER — Telehealth: Payer: Self-pay

## 2017-01-28 ENCOUNTER — Other Ambulatory Visit: Payer: Self-pay | Admitting: Obstetrics & Gynecology

## 2017-01-28 DIAGNOSIS — B9689 Other specified bacterial agents as the cause of diseases classified elsewhere: Secondary | ICD-10-CM

## 2017-01-28 DIAGNOSIS — N760 Acute vaginitis: Principal | ICD-10-CM

## 2017-01-28 MED ORDER — METRONIDAZOLE 500 MG PO TABS: 500.0000 mg | ORAL_TABLET | Freq: Two times a day (BID) | ORAL | 0 refills | Status: DC

## 2017-01-28 NOTE — Telephone Encounter (Signed)
Called patient inform her of test results and need to follow up with a primary care provider regarding increase lipids. I have referred her to [facility] at [facility].

## 2017-01-28 NOTE — Telephone Encounter (Signed)
-----   Message from Lavonia Drafts, MD sent at 01/28/2017  9:16 AM EST ----- Please call pt:  #1  She has BV. Rx at the pharmacy. #2  Her lipids are elevated. She needs to be referred to primary care for eval.  Thx, clh-S

## 2017-02-04 ENCOUNTER — Telehealth: Payer: Self-pay | Admitting: Obstetrics & Gynecology

## 2017-02-04 NOTE — Telephone Encounter (Signed)
Spoke to patient inform her we will get her scheduled for TV/ [place] in 3 month.

## 2017-02-04 NOTE — Telephone Encounter (Signed)
TC to pt to review [place] report. No answer. Left message.  Fonnie Crookshanks L. Harraway-Smith, M.D., [place]nn June

## 2017-02-05 ENCOUNTER — Telehealth: Payer: Self-pay | Admitting: *Deleted

## 2017-02-05 NOTE — Telephone Encounter (Signed)
-----   Message from Blanchie Dessert, [place] sent at 02/04/2017 11:31 AM EST ----- Regarding: please call pt Contact: [phone] Please call patient with the results from her [place], she was left a message from the Dr to contact the office

## 2017-02-17 ENCOUNTER — Ambulatory Visit
Admission: RE | Admit: 2017-02-17 | Discharge: 2017-02-17 | Disposition: A | Discharge status: Home / Self Care | Payer: 59 | Source: Ambulatory Visit | Attending: Obstetrics & Gynecology | Admitting: Obstetrics & Gynecology

## 2017-02-17 DIAGNOSIS — Z01419 Encounter for gynecological examination (general) (routine) without abnormal findings: Secondary | ICD-10-CM

## 2017-02-25 DIAGNOSIS — M19049 Primary osteoarthritis, unspecified hand: Secondary | ICD-10-CM

## 2017-02-25 HISTORY — DX: Primary osteoarthritis, unspecified hand: M19.049

## 2017-03-06 ENCOUNTER — Other Ambulatory Visit: Payer: Self-pay

## 2017-03-06 ENCOUNTER — Ambulatory Visit (AMBULATORY_SURGERY_CENTER): Payer: Self-pay | Admitting: *Deleted

## 2017-03-06 VITALS — Ht 66.0 in | Wt 170.0 lb

## 2017-03-06 DIAGNOSIS — Z1211 Encounter for screening for malignant neoplasm of colon: Secondary | ICD-10-CM

## 2017-03-06 NOTE — Progress Notes (Signed)
Patient denies any allergies to egg or soy products. Patient denies complications with anesthesia/sedation.  Patient denies oxygen use at home and denies diet medications. Pamphlet given on colonoscopy. 

## 2017-03-12 ENCOUNTER — Encounter: Payer: Self-pay | Admitting: Internal Medicine

## 2017-03-19 ENCOUNTER — Encounter: Payer: Self-pay | Admitting: Primary Care

## 2017-03-19 ENCOUNTER — Ambulatory Visit: Payer: 59 | Admitting: Primary Care

## 2017-03-19 DIAGNOSIS — E785 Hyperlipidemia, unspecified: Secondary | ICD-10-CM | POA: Insufficient documentation

## 2017-03-19 DIAGNOSIS — R030 Elevated blood-pressure reading, without diagnosis of hypertension: Secondary | ICD-10-CM

## 2017-03-19 DIAGNOSIS — J309 Allergic rhinitis, unspecified: Secondary | ICD-10-CM | POA: Diagnosis not present

## 2017-03-19 NOTE — Assessment & Plan Note (Signed)
Above goal today, also noted on chart. Will have her work on diet and start exercising. She will also start monitoring BP at home and report readings at or above 135/90 on a consistent basis. Follow up in 3 months for recheck.

## 2017-03-19 NOTE — Progress Notes (Signed)
Subjective:    Patient ID: Ashley [name], female    DOB: Mar 02, 1965, 52 y.o.   MRN: [id number]  HPI  Ashley [name] is a 52 year old female who presents today to establish care and discuss the problems mentioned below. Will obtain old records.  1) Constipation: Occasional constipation. Bowel movements three times weekly. She will be going for her colonoscopy tomorrow. She gallbladder removed in 2008. She doesn't take anything for constipation OTC.  2) Hyperlipidemia: Lipid panel in November 2018 with TC of 214, Trigs of 58 and LDl of 129. This was checked by her Ob/GYN who recommended she establish with PCP and have this rechecked.   Diet currently consists of:  Breakfast: Eggs (sometimes fried), [place] sausage, oatmeal, cereal, grits Lunch: Skips sometimes, Sandwich that she makes or sometimes [employer] Dinner: Baked lean protein, little pasta, vegetables, Snacks: Occasionally fruit, cookies. chips Desserts: Cookies 2-3 days weekly. Beverages: Water, un-sweet juices  Exercise: She does not currently exercise.   3) Hyperthyroidism: Occurred in 2003, underwent radioactive iodine at that time and hasn't had problems. TSH in November 2018 of 1.860. She denies history of cancer.   4) Chronic Sinusitis: Chronic sinus pressure that is worse in the morning that will improve throughout the day. She does notice postnasal drip. She was last treated for a sinusitis in early 2018. She doesn't take anything OTC for her symptoms.  5) Elevated Blood Pressure: Endorses history of elevated readings. She does not check her BP at home or on a regular basis. She denies dizziness and chest pain, but does experience headaches.   BP Readings from Last 3 Encounters:  03/19/17 138/90  01/21/17 125/83  05/19/16 (!) 147/88      Review of Systems  Constitutional: Negative for fatigue.  HENT: Positive for postnasal drip and sinus pressure. Negative for congestion.   Respiratory: Negative for shortness  of breath.   Cardiovascular: Negative for chest pain.  Gastrointestinal:       Chronic constipation  Neurological: Positive for headaches. Negative for dizziness.       Past Medical History:  Diagnosis Date  . Bronchitis   . GERD (gastroesophageal reflux disease)    diet controlled , no meds  . Hyperlipidemia    recent dx - currently diet control - no meds  . Hypothyroid 2003   radio active iodine - no meds  . SVD (spontaneous vaginal delivery)    x 1     Social History   Socioeconomic History  . Marital status: Single    Spouse name: Not on file  . Number of children: Not on file  . Years of education: Not on file  . Highest education level: Not on file  Social Needs  . Financial resource strain: Not on file  . Food insecurity - worry: Not on file  . Food insecurity - inability: Not on file  . Transportation needs - medical: Not on file  . Transportation needs - non-medical: Not on file  Occupational History  . Not on file  Tobacco Use  . Smoking status: Never Smoker  . Smokeless tobacco: Never Used  Substance and Sexual Activity  . Alcohol use: No  . Drug use: No  . Sexual activity: Yes    Partners: Male    Birth control/protection: Surgical    Comment: hysterectomy.  Other Topics Concern  . Not on file  Social History Narrative   Single.   3 children.   Works at [employer] as a Designer, multimedia.  Enjoys relaxing, exercise, walking her dog.     Past Surgical History:  Procedure Laterality Date  . CESAREAN SECTION     x 2  . CHOLECYSTECTOMY  2008  . DILATION AND CURETTAGE OF UTERUS     X 2  . SHOULDER SURGERY  2016   arthroscopic  . TOTAL ABDOMINAL HYSTERECTOMY  2006   Fibroids, benign pathology  . TUBAL LIGATION    . WISDOM TOOTH EXTRACTION     at age 61 yr  . WRIST ARTHROSCOPY Right 2007    Family History  Problem Relation Age of Onset  . Hypertension Mother   . Breast cancer Maternal Aunt   . Hypertension Maternal Grandmother   . Heart failure  Maternal Grandmother   . Lung cancer Maternal Grandmother   . Colon cancer Neg Hx   . Colon polyps Neg Hx   . Rectal cancer Neg Hx   . Stomach cancer Neg Hx     No Known Allergies  Current Outpatient Medications on File Prior to Visit  Medication Sig Dispense Refill  . bisacodyl (BISACODYL) 5 MG EC tablet Take 5 mg by mouth daily as needed for moderate constipation. Dulcolax 5 mg tab take as directed for colonoscopy prep.    . polyethylene glycol (MIRALAX) packet Take 17 g by mouth daily. Miralax 238 grams as directed for colonoscopy prep.     No current facility-administered medications on file prior to visit.     BP 138/90   Pulse 79   Temp 98 F (36.7 C) (Oral)   Ht 5\' 6"  (1.676 m)   Wt 167 lb 1.9 oz (75.8 kg)   SpO2 98%   BMI 26.97 kg/m    Objective:   Physical Exam  Constitutional: She appears well-nourished.  HENT:  Right Ear: Tympanic membrane and ear canal normal.  Left Ear: Tympanic membrane and ear canal normal.  Nose: Right sinus exhibits maxillary sinus tenderness. Right sinus exhibits no frontal sinus tenderness. Left sinus exhibits maxillary sinus tenderness. Left sinus exhibits no frontal sinus tenderness.  Mouth/Throat: Oropharynx is clear and moist.  Eyes: Conjunctivae are normal.  Neck: Neck supple.  Cardiovascular: Normal rate and regular rhythm.  Pulmonary/Chest: Effort normal and breath sounds normal. She has no wheezes. She has no rales.  Lymphadenopathy:    She has no cervical adenopathy.  Skin: Skin is warm and dry.  Psychiatric: She has a normal mood and affect.          Assessment & Plan:

## 2017-03-19 NOTE — Assessment & Plan Note (Signed)
TC above goal, HDL at 69. LDL borderline. Discussed the importance of a healthy diet and regular exercise in order for weight loss, and to reduce the risk of any potential medical problems. Will recheck labs in 3 months to allow her to work on diet and exercise.

## 2017-03-19 NOTE — Assessment & Plan Note (Signed)
Symptoms of sinus pressure, post nasal drip. Suspect underlying allergies. Will start Zyrtec daily and Flonase PRN. No active sinusitis today.

## 2017-03-19 NOTE — Patient Instructions (Signed)
Start taking a daily antihistamine like Zyrtec in the evening to prevent drainage and sinus pressure.  Nasal Congestion/Ear Pressure/Sinus Pressure: Try using Flonase (fluticasone) nasal spray. Instill 1 spray in each nostril twice daily.   Schedule a lab only appointment in 6 months to recheck your cholesterol. Make sure not to eat 8 hours before this appointment. You may have water and black coffee.  Start exercising. You should be getting 150 minutes of moderate intensity exercise weekly.  Increase vegetables, fruit, whole grains.  Ensure you are consuming 64 ounces of water daily.  It was a pleasure to meet you today! Please don't hesitate to call or message me with any questions. Welcome to [facility]!   High Cholesterol High cholesterol is a condition in which the blood has high levels of a white, waxy, fat-like substance (cholesterol). The human body needs small amounts of cholesterol. The liver makes all the cholesterol that the body needs. Extra (excess) cholesterol comes from the food that we eat. Cholesterol is carried from the liver by the blood through the blood vessels. If you have high cholesterol, deposits (plaques) may build up on the walls of your blood vessels (arteries). Plaques make the arteries narrower and stiffer. Cholesterol plaques increase your risk for heart attack and stroke. Work with your health care provider to keep your cholesterol levels in a healthy range. What increases the risk? This condition is more likely to develop in people who:  Eat foods that are high in animal fat (saturated fat) or cholesterol.  Are overweight.  Are not getting enough exercise.  Have a family history of high cholesterol.  What are the signs or symptoms? There are no symptoms of this condition. How is this diagnosed? This condition may be diagnosed from the results of a blood test.  If you are older than age 45, your health care provider may check your cholesterol every  4-6 years.  You may be checked more often if you already have high cholesterol or other risk factors for heart disease.  The blood test for cholesterol measures:  "Bad" cholesterol (LDL cholesterol). This is the main type of cholesterol that causes heart disease. The desired level for LDL is less than 100.  "Good" cholesterol (HDL cholesterol). This type helps to protect against heart disease by cleaning the arteries and carrying the LDL away. The desired level for HDL is 60 or higher.  Triglycerides. These are fats that the body can store or burn for energy. The desired number for triglycerides is lower than 150.  Total cholesterol. This is a measure of the total amount of cholesterol in your blood, including LDL cholesterol, HDL cholesterol, and triglycerides. A healthy number is less than 200.  How is this treated? This condition is treated with diet changes, lifestyle changes, and medicines. Diet changes  This may include eating more whole grains, fruits, vegetables, nuts, and fish.  This may also include cutting back on red meat and foods that have a lot of added sugar. Lifestyle changes  Changes may include getting at least 40 minutes of aerobic exercise 3 times a week. Aerobic exercises include walking, biking, and swimming. Aerobic exercise along with a healthy diet can help you maintain a healthy weight.  Changes may also include quitting smoking. Medicines  Medicines are usually given if diet and lifestyle changes have failed to reduce your cholesterol to healthy levels.  Your health care provider may prescribe a statin medicine. Statin medicines have been shown to reduce cholesterol, which can reduce  the risk of heart disease. Follow these instructions at home: Eating and drinking  If told by your health care provider:  Eat chicken (without skin), fish, veal, shellfish, ground [place] breast, and round or loin cuts of red meat.  Do not eat fried foods or fatty meats,  such as hot dogs and salami.  Eat plenty of fruits, such as apples.  Eat plenty of vegetables, such as broccoli, potatoes, and carrots.  Eat beans, peas, and lentils.  Eat grains such as barley, rice, couscous, and bulgur wheat.  Eat pasta without cream sauces.  Use skim or nonfat milk, and eat low-fat or nonfat yogurt and cheeses.  Do not eat or drink whole milk, cream, ice cream, egg yolks, or hard cheeses.  Do not eat stick margarine or tub margarines that contain trans fats (also called partially hydrogenated oils).  Do not eat saturated tropical oils, such as coconut oil and palm oil.  Do not eat cakes, cookies, crackers, or other baked goods that contain trans fats.  General instructions  Exercise as directed by your health care provider. Increase your activity level with activities such as gardening, walking, and taking the stairs.  Take over-the-counter and prescription medicines only as told by your health care provider.  Do not use any products that contain nicotine or tobacco, such as cigarettes and e-cigarettes. If you need help quitting, ask your health care provider.  Keep all follow-up visits as told by your health care provider. This is important. Contact a health care provider if:  You are struggling to maintain a healthy diet or weight.  You need help to start on an exercise program.  You need help to stop smoking. Get help right away if:  You have chest pain.  You have trouble breathing. This information is not intended to replace advice given to you by your health care provider. Make sure you discuss any questions you have with your health care provider. Document Released: 02/11/2005 Document Revised: 09/09/2015 Document Reviewed: 08/12/2015 Elsevier Interactive Patient Education  [employer].

## 2017-03-20 ENCOUNTER — Other Ambulatory Visit: Payer: Self-pay

## 2017-03-20 ENCOUNTER — Encounter: Payer: Self-pay | Admitting: Internal Medicine

## 2017-03-20 ENCOUNTER — Ambulatory Visit (AMBULATORY_SURGERY_CENTER): Payer: 59 | Admitting: Internal Medicine

## 2017-03-20 VITALS — BP 128/92 | HR 71 | Temp 97.3°F | Resp 13 | Ht 66.0 in | Wt 165.0 lb

## 2017-03-20 DIAGNOSIS — D122 Benign neoplasm of ascending colon: Secondary | ICD-10-CM

## 2017-03-20 DIAGNOSIS — K5140 Inflammatory polyps of colon without complications: Secondary | ICD-10-CM | POA: Diagnosis not present

## 2017-03-20 DIAGNOSIS — Z1211 Encounter for screening for malignant neoplasm of colon: Secondary | ICD-10-CM | POA: Diagnosis not present

## 2017-03-20 DIAGNOSIS — Z1212 Encounter for screening for malignant neoplasm of rectum: Secondary | ICD-10-CM | POA: Diagnosis not present

## 2017-03-20 MED ORDER — SODIUM CHLORIDE 0.9 % IV SOLN: 500.0000 mL | Freq: Once | INTRAVENOUS | Status: DC

## 2017-03-20 NOTE — Progress Notes (Signed)
Patient consents to observer being present for procedure.   

## 2017-03-20 NOTE — Progress Notes (Signed)
Called to room to assist during endoscopic procedure.  Patient ID and intended procedure confirmed with present staff. Received instructions for my participation in the procedure from the performing physician.  

## 2017-03-20 NOTE — Patient Instructions (Addendum)
   I found and removed one small polyp that looks benign. You also have a condition called diverticulosis - common and not usually a problem. Please read the handout provided.  I will let you know pathology results and when to have another routine colonoscopy by mail and/or My Chart.  I appreciate the opportunity to care for you. Gatha Mayer, MD, [facility]   **Handouts given on Polyps and Diverticulosis**  YOU HAD AN ENDOSCOPIC PROCEDURE TODAY: Refer to the procedure report and other information in the discharge instructions given to you for any specific questions about what was found during the examination. If this information does not answer your questions, please call [place] office at [phone] to clarify.   YOU SHOULD EXPECT: Some feelings of bloating in the abdomen. Passage of more gas than usual. Walking can help get rid of the air that was put into your GI tract during the procedure and reduce the bloating. If you had a lower endoscopy (such as a colonoscopy or flexible sigmoidoscopy) you may notice spotting of blood in your stool or on the toilet paper. Some abdominal soreness may be present for a day or two, also.  DIET: Your first meal following the procedure should be a light meal and then it is ok to progress to your normal diet. A half-sandwich or bowl of soup is an example of a good first meal. Heavy or fried foods are harder to digest and may make you feel nauseous or bloated. Drink plenty of fluids but you should avoid alcoholic beverages for 24 hours. If you had a esophageal dilation, please see attached instructions for diet.    ACTIVITY: Your care partner should take you home directly after the procedure. You should plan to take it easy, moving slowly for the rest of the day. You can resume normal activity the day after the procedure however YOU SHOULD NOT DRIVE, use power tools, machinery or perform tasks that involve climbing or major physical exertion for 24 hours  (because of the sedation medicines used during the test).   SYMPTOMS TO REPORT IMMEDIATELY: A gastroenterologist can be reached at any hour. Please call [phone]  for any of the following symptoms:  Following lower endoscopy (colonoscopy, flexible sigmoidoscopy) Excessive amounts of blood in the stool  Significant tenderness, worsening of abdominal pains  Swelling of the abdomen that is new, acute  Fever of 100 or higher    FOLLOW UP:  If any biopsies were taken you will be contacted by phone or by letter within the next 1-3 weeks. Call [phone]  if you have not heard about the biopsies in 3 weeks.  Please also call with any specific questions about appointments or follow up tests.

## 2017-03-20 NOTE — Progress Notes (Signed)
Pt's states no medical or surgical changes since previsit or office visit.   No allergy to soy or Eggs per pt

## 2017-03-20 NOTE — Progress Notes (Signed)
Report to PACU, RN, vss, BBS= Clear.  

## 2017-03-20 NOTE — Op Note (Signed)
[facility] Patient Name: Ashley [name] Procedure Date: 03/20/2017 9:26 AM MRN: [id number] Endoscopist: Gatha Mayer , MD Age: 52 Referring MD:  Date of Birth: 12-31-1965 Gender: Female Account #: [id number] Procedure:                Colonoscopy Indications:              Screening for colorectal malignant neoplasm, This                            is the patient's first colonoscopy Medicines:                Propofol per Anesthesia, Monitored Anesthesia Care Procedure:                Pre-Anesthesia Assessment:                           - Prior to the procedure, a History and Physical                            was performed, and patient medications and                            allergies were reviewed. The patient's tolerance of                            previous anesthesia was also reviewed. The risks                            and benefits of the procedure and the sedation                            options and risks were discussed with the patient.                            All questions were answered, and informed consent                            was obtained. Prior Anticoagulants: The patient has                            taken no previous anticoagulant or antiplatelet                            agents. ASA Grade Assessment: II - A patient with                            mild systemic disease. After reviewing the risks                            and benefits, the patient was deemed in                            satisfactory condition to undergo the procedure.  After obtaining informed consent, the colonoscope                            was passed under direct vision. Throughout the                            procedure, the patient's blood pressure, pulse, and                            oxygen saturations were monitored continuously. The                            Model PCF-[id number]) scope was introduced      through the anus and advanced to the the cecum,                            identified by appendiceal orifice and ileocecal                            valve. The colonoscopy was performed without                            difficulty. The patient tolerated the procedure                            well. The quality of the bowel preparation was                            excellent. The ileocecal valve, appendiceal                            orifice, and rectum were photographed. The bowel                            preparation used was Miralax. Scope In: 9:29:24 AM Scope Out: 9:42:47 AM Scope Withdrawal Time: 0 hours 10 minutes 49 seconds  Total Procedure Duration: 0 hours 13 minutes 23 seconds  Findings:                 The perianal and digital rectal examinations were                            normal.                           A diminutive polyp was found in the ascending                            colon. The polyp was sessile. The polyp was removed                            with a cold snare. Resection and retrieval were                            complete. Verification of  patient identification                            for the specimen was done. Estimated blood loss was                            minimal.                           Scattered diverticula were found in the left colon.                           The exam was otherwise without abnormality on                            direct and retroflexion views. Complications:            No immediate complications. Estimated Blood Loss:     Estimated blood loss was minimal. Impression:               - One diminutive polyp in the ascending colon,                            removed with a cold snare. Resected and retrieved.                           - Diverticulosis in the left colon.                           - The examination was otherwise normal on direct                            and retroflexion views. Recommendation:            - Patient has a contact number available for                            emergencies. The signs and symptoms of potential                            delayed complications were discussed with the                            patient. Return to normal activities tomorrow.                            Written discharge instructions were provided to the                            patient.                           - Resume previous diet.                           - Continue present medications.                           -  Repeat colonoscopy is recommended. The                            colonoscopy date will be determined after pathology                            results from today's exam become available for                            review. Gatha Mayer, MD 03/20/2017 9:48:03 AM This report has been signed electronically.

## 2017-03-21 ENCOUNTER — Telehealth: Payer: Self-pay

## 2017-03-21 NOTE — Telephone Encounter (Signed)
Left message

## 2017-03-21 NOTE — Telephone Encounter (Signed)
  Follow up Call-  Call back number 03/20/2017  Post procedure Call Back phone  # [phone]  Permission to leave phone message Yes  Some recent data might be hidden     Left message

## 2017-03-25 ENCOUNTER — Encounter: Payer: Self-pay | Admitting: Internal Medicine

## 2017-03-25 NOTE — Progress Notes (Signed)
Inflammatory polyp Recall colon 2029

## 2017-05-08 ENCOUNTER — Other Ambulatory Visit (INDEPENDENT_AMBULATORY_CARE_PROVIDER_SITE_OTHER): Payer: 59

## 2017-05-08 VITALS — BP 113/74 | HR 75

## 2017-05-08 DIAGNOSIS — N898 Other specified noninflammatory disorders of vagina: Secondary | ICD-10-CM | POA: Diagnosis not present

## 2017-05-08 DIAGNOSIS — Z113 Encounter for screening for infections with a predominantly sexual mode of transmission: Secondary | ICD-10-CM | POA: Diagnosis not present

## 2017-05-08 NOTE — Progress Notes (Signed)
Patient seen and assessed by nursing staff.  Agree with documentation and plan.  

## 2017-05-08 NOTE — Progress Notes (Signed)
SUBJECTIVE:  52 y.o. female complains of vaginal discharge for several days now. Denies abnormal vaginal bleeding or significant pelvic pain or fever. No UTI symptoms. Denies history of known exposure to STD.  No LMP recorded. Patient has had a hysterectomy.  OBJECTIVE:  She appears well, afebrile. Urine dipstick:not checked today.  ASSESSMENT:  Vaginal Discharge  Vaginal Odor   PLAN:  GC, chlamydia, trichomonas, BVAG, CVAG probe sent to lab. Treatment: To be determined once lab results are received ROV prn if symptoms persist or worsen.

## 2017-05-09 ENCOUNTER — Other Ambulatory Visit: Payer: Self-pay | Admitting: Family Medicine

## 2017-05-09 DIAGNOSIS — N760 Acute vaginitis: Principal | ICD-10-CM

## 2017-05-09 DIAGNOSIS — B9689 Other specified bacterial agents as the cause of diseases classified elsewhere: Secondary | ICD-10-CM

## 2017-05-09 LAB — CERVICOVAGINAL ANCILLARY ONLY
Bacterial vaginitis: POSITIVE — AB
CANDIDA VAGINITIS: NEGATIVE
CHLAMYDIA, DNA PROBE: NEGATIVE
Neisseria Gonorrhea: NEGATIVE
Trichomonas: NEGATIVE

## 2017-05-09 MED ORDER — METRONIDAZOLE 500 MG PO TABS
500.0000 mg | ORAL_TABLET | Freq: Two times a day (BID) | ORAL | 0 refills | Status: DC
Start: 2017-05-09 — End: 2017-06-30

## 2017-06-17 ENCOUNTER — Ambulatory Visit: Payer: 59 | Admitting: Primary Care

## 2017-06-30 ENCOUNTER — Ambulatory Visit: Payer: 59 | Admitting: Primary Care

## 2017-06-30 ENCOUNTER — Encounter: Payer: Self-pay | Admitting: Radiology

## 2017-06-30 ENCOUNTER — Ambulatory Visit (INDEPENDENT_AMBULATORY_CARE_PROVIDER_SITE_OTHER): Payer: 59 | Admitting: Obstetrics & Gynecology

## 2017-06-30 ENCOUNTER — Encounter: Payer: Self-pay | Admitting: Obstetrics & Gynecology

## 2017-06-30 VITALS — BP 130/85 | HR 80 | Ht 66.0 in | Wt 168.4 lb

## 2017-06-30 DIAGNOSIS — M545 Low back pain: Secondary | ICD-10-CM

## 2017-06-30 DIAGNOSIS — R1030 Lower abdominal pain, unspecified: Secondary | ICD-10-CM

## 2017-06-30 LAB — POCT URINALYSIS DIPSTICK
BILIRUBIN UA: NEGATIVE
Blood, UA: NEGATIVE
GLUCOSE UA: NEGATIVE
KETONES UA: NEGATIVE
Leukocytes, UA: NEGATIVE
Nitrite, UA: NEGATIVE
PROTEIN UA: NEGATIVE
SPEC GRAV UA: 1.025 (ref 1.010–1.025)
Urobilinogen, UA: 0.2 E.U./dL
pH, UA: 6.5 (ref 5.0–8.0)

## 2017-06-30 MED ORDER — IBUPROFEN 600 MG PO TABS: 600.0000 mg | ORAL_TABLET | Freq: Four times a day (QID) | ORAL | 0 refills | Status: DC | PRN

## 2017-06-30 NOTE — Patient Instructions (Signed)
Ovarian Cyst An ovarian cyst is a fluid-filled sac that forms on an ovary. The ovaries are small organs that produce eggs in women. Various types of cysts can form on the ovaries. Some may cause symptoms and require treatment. Most ovarian cysts go away on their own, are not cancerous (are benign), and do not cause problems. Common types of ovarian cysts include:  Functional (follicle) cysts. ? Occur during the menstrual cycle, and usually go away with the next menstrual cycle if you do not get pregnant. ? Usually cause no symptoms.  Endometriomas. ? Are cysts that form from the tissue that lines the uterus (endometrium). ? Are sometimes called "chocolate cysts" because they become filled with blood that turns brown. ? Can cause pain in the lower abdomen during intercourse and during your period.  Cystadenoma cysts. ? Develop from cells on the outside surface of the ovary. ? Can get very large and cause lower abdomen pain and pain with intercourse. ? Can cause severe pain if they twist or break open (rupture).  Dermoid cysts. ? Are sometimes found in both ovaries. ? May contain different kinds of body tissue, such as skin, teeth, hair, or cartilage. ? Usually do not cause symptoms unless they get very big.  Theca lutein cysts. ? Occur when too much of a certain hormone (human chorionic gonadotropin) is produced and overstimulates the ovaries to produce an egg. ? Are most common after having procedures used to assist with the conception of a baby (in vitro fertilization).  What are the causes? Ovarian cysts may be caused by:  Ovarian hyperstimulation syndrome. This is a condition that can develop from taking fertility medicines. It causes multiple large ovarian cysts to form.  Polycystic ovarian syndrome (PCOS). This is a common hormonal disorder that can cause ovarian cysts, as well as problems with your period or fertility.  What increases the risk? The following factors may make  you more likely to develop ovarian cysts:  Being overweight or obese.  Taking fertility medicines.  Taking certain forms of hormonal birth control.  Smoking.  What are the signs or symptoms? Many ovarian cysts do not cause symptoms. If symptoms are present, they may include:  Pelvic pain or pressure.  Pain in the lower abdomen.  Pain during sex.  Abdominal swelling.  Abnormal menstrual periods.  Increasing pain with menstrual periods.  How is this diagnosed? These cysts are commonly found during a routine pelvic exam. You may have tests to find out more about the cyst, such as:  Ultrasound.  X-ray of the pelvis.  CT scan.  MRI.  Blood tests.  How is this treated? Many ovarian cysts go away on their own without treatment. Your health care provider may want to check your cyst regularly for 2-3 months to see if it changes. If you are in menopause, it is especially important to have your cyst monitored closely because menopausal women have a higher rate of ovarian cancer. When treatment is needed, it may include:  Medicines to help relieve pain.  A procedure to drain the cyst (aspiration).  Surgery to remove the whole cyst.  Hormone treatment or birth control pills. These methods are sometimes used to help dissolve a cyst.  Follow these instructions at home:  Take over-the-counter and prescription medicines only as told by your health care provider.  Do not drive or use heavy machinery while taking prescription pain medicine.  Get regular pelvic exams and Pap tests as often as told by your health care   provider.  Return to your normal activities as told by your health care provider. Ask your health care provider what activities are safe for you.  Do not use any products that contain nicotine or tobacco, such as cigarettes and e-cigarettes. If you need help quitting, ask your health care provider.  Keep all follow-up visits as told by your health care provider.  This is important. Contact a health care provider if:  Your periods are late, irregular, or painful, or they stop.  You have pelvic pain that does not go away.  You have pressure on your bladder or trouble emptying your bladder completely.  You have pain during sex.  You have any of the following in your abdomen: ? A feeling of fullness. ? Pressure. ? Discomfort. ? Pain that does not go away. ? Swelling.  You feel generally ill.  You become constipated.  You lose your appetite.  You develop severe acne.  You start to have more body hair and facial hair.  You are gaining weight or losing weight without changing your exercise and eating habits.  You think you may be pregnant. Get help right away if:  You have abdominal pain that is severe or gets worse.  You cannot eat or drink without vomiting.  You suddenly develop a fever.  Your menstrual period is much heavier than usual. This information is not intended to replace advice given to you by your health care provider. Make sure you discuss any questions you have with your health care provider. Document Released: 02/11/2005 Document Revised: 09/01/2015 Document Reviewed: 07/16/2015 Elsevier Interactive Patient Education  2018 [employer]. Pelvic Pain, Female Pelvic pain is pain in your lower abdomen, below your belly button and between your hips. The pain may start suddenly (acute), keep coming back (recurring), or last a long time (chronic). Pelvic pain that lasts longer than six months is considered chronic. Pelvic pain may affect your:  Reproductive organs.  Urinary system.  Digestive tract.  Musculoskeletal system.  There are many potential causes of pelvic pain. Sometimes, the pain can be a result of digestive or urinary conditions, strained muscles or ligaments, or even reproductive conditions. Sometimes the cause of pelvic pain is not known. Follow these instructions at home:  Take over-the-counter and  prescription medicines only as told by your health care provider.  Rest as told by your health care provider.  Do not have sex it if hurts.  Keep a journal of your pelvic pain. Write down: ? When the pain started. ? Where the pain is located. ? What seems to make the pain better or worse, such as food or your menstrual cycle. ? Any symptoms you have along with the pain.  Keep all follow-up visits as told by your health care provider. This is important. Contact a health care provider if:  Medicine does not help your pain.  Your pain comes back.  You have new symptoms.  You have abnormal vaginal discharge or bleeding, including bleeding after menopause.  You have a fever or chills.  You are constipated.  You have blood in your urine or stool.  You have foul-smelling urine.  You feel weak or lightheaded. Get help right away if:  You have sudden severe pain.  Your pain gets steadily worse.  You have severe pain along with fever, nausea, vomiting, or excessive sweating.  You lose consciousness. This information is not intended to replace advice given to you by your health care provider. Make sure you discuss any questions  you have with your health care provider. Document Released: 01/09/2004 Document Revised: 03/08/2015 Document Reviewed: 12/02/2014 Elsevier Interactive Patient Education  2018 [employer].

## 2017-06-30 NOTE — Progress Notes (Signed)
[facility] History:  52 y.o. [id number] here today for dysuria and urinary freq.   There is no hematuria. She reports that the pain is new and not the pain that she had prev.  Pt denies fever, chills, nausea or emesis. No weight loss.   The following portions of the patient's history were reviewed and updated as appropriate: allergies, current medications, past family history, past medical history, past social history, past surgical history and problem list.  Review of Systems:  Pertinent items are noted in HPI.    Objective:  Physical Exam Blood pressure 130/85, pulse 80, height 5\' 6"  (1.676 m), weight 168 lb 6.4 oz (76.4 kg).  CONSTITUTIONAL: Well-developed, well-nourished female in no acute distress.  HENT:  Normocephalic, atraumatic EYES: Conjunctivae and EOM are normal. No scleral icterus.  NECK: Normal range of motion SKIN: Skin is warm and dry. No rash noted. Not diaphoretic.No pallor. [place]: Alert and oriented to person, place, and time. Normal coordination.  Abd: Soft, nondistended but, diffusely tender to palpation. No rebound or guarding  Pelvic: Normal appearing external genitalia; her uterus and cervix are surgically absent.  There is bilateral adnexal tenderness and fullness L>R.    Labs and Imaging 01/24/2017 CLINICAL DATA:  LEFT lower quadrant pain and adnexal fullness, prior hysterectomy  EXAM: TRANSABDOMINAL AND TRANSVAGINAL ULTRASOUND OF PELVIS  TECHNIQUE: Both transabdominal and transvaginal ultrasound examinations of the pelvis were performed. Transabdominal technique was performed for global imaging of the pelvis including uterus, ovaries, adnexal regions, and pelvic cul-de-sac. It was necessary to proceed with endovaginal exam following the transabdominal exam to visualize the adnexa.  COMPARISON:  None  FINDINGS: Uterus  Surgically absent  Endometrium  N/A  Right ovary  Measurements: 3.1 x 1.3 x 1.2 cm. Normal morphology without  mass  Left ovary  Measurements: 4.3 x 2.7 x 2.6 cm. Solid mass containing internal blood flow on color Doppler imaging within LEFT ovary 2.0 x 1.3 x 2.5 cm. Small cyst with small focus of wall irregularity within LEFT ovary 2.2 x 2.3 x 2.0 cm.  Other findings  Small amount of nonspecific free pelvic fluid.  Tubular hypoechoic fluid filled structures in the adnexa bilaterally likely representing hydrosalpinx.  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  Minimally complicated cyst LEFT ovary with a small solid nodule within LEFT ovary 2.5 cm in greatest size, of uncertain significance; recommend follow-up sonography in 6-12 weeks to reassess.   Assessment & Plan:  Pelvic pain and adnexal tenderness and fullness   Pelvic US  Pt with prior h/o adnexal masses. Did not get f/u US.   If masses noted will get CA125  Further management pending [place]. May need surgical eval if [place] continued abnormal  (see  possible bilateral hydrosalpinx above)   Jalia Zuniga L. Harraway-Smith, M.D., Cherlynn June

## 2017-06-30 NOTE — Progress Notes (Signed)
Pt complains of having lower abdominal/back pain radiating all the way around. Also complains of having urinary frequency. She denies having any pain with urination. Sx started about a couple of days ago. She has not taken anything for the pain.

## 2017-07-04 ENCOUNTER — Ambulatory Visit (HOSPITAL_COMMUNITY): Admission: RE | Admit: 2017-07-04 | Payer: 59 | Source: Ambulatory Visit

## 2017-07-08 ENCOUNTER — Encounter: Payer: Self-pay | Admitting: Primary Care

## 2017-07-08 ENCOUNTER — Ambulatory Visit: Payer: 59 | Admitting: Primary Care

## 2017-07-08 VITALS — BP 118/74 | HR 71 | Temp 98.4°F | Ht 66.0 in | Wt 171.5 lb

## 2017-07-08 DIAGNOSIS — E785 Hyperlipidemia, unspecified: Secondary | ICD-10-CM | POA: Diagnosis not present

## 2017-07-08 DIAGNOSIS — R030 Elevated blood-pressure reading, without diagnosis of hypertension: Secondary | ICD-10-CM

## 2017-07-08 NOTE — Assessment & Plan Note (Signed)
Commended her on changes in diet, recommended regular exercise. Repeat lipids pending today.

## 2017-07-08 NOTE — Patient Instructions (Signed)
Stop by the lab prior to leaving today. I will notify you of your results once received.   Continue to work on your diet by eliminating fast/fried food. Continue to eat vegetables, fruit, whole grains, lean protein.  Ensure you are consuming 64 ounces of water daily.  Start exercising. You should be getting 150 minutes of moderate intensity exercise weekly.  It was a pleasure to see you today!

## 2017-07-08 NOTE — Progress Notes (Signed)
Subjective:    Patient ID: Ashley [name], female    DOB: Jun 26, 1965, 52 y.o.   MRN: [id number]  HPI  Ashley [name] is a 52 year old female who presents today for follow up.  1) Hyperlipidemia: Last lipid panel in November 2018 with TC of 214 and LDL of 129. She was encouraged to work on lifestyle changes through diet and exercise. She is due for repeat lipids today.  The 10-year ASCVD risk score Mikey Bussing DC Brooke Bonito., et al., 2013) is: 1.3%   Values used to calculate the score:     Age: 35 years     Sex: Female     Is Non-Hispanic African American: Yes     Diabetic: No     Tobacco smoker: No     Systolic Blood Pressure: 924 mmHg     Is BP treated: No     HDL Cholesterol: 73 mg/dL     Total Cholesterol: 214 mg/dL   2) Elevated Blood Pressure Reading: Noted on last exam with reading of 138/90. She was encouraged to work on lifestyle changes through diet and regular exercise.   BP Readings from Last 3 Encounters:  07/08/17 118/74  06/30/17 130/85  05/08/17 113/74   Wt Readings from Last 3 Encounters:  07/08/17 171 lb 8 oz (77.8 kg)  06/30/17 168 lb 6.4 oz (76.4 kg)  03/20/17 165 lb (74.8 kg)   She's not been checking her BP at home or since her last visit. She's decreased fried foods. She's eating more vegetables, fruit, baked protein.   Review of Systems  Respiratory: Negative for shortness of breath.   Cardiovascular: Negative for chest pain.  Neurological: Negative for dizziness and headaches.       Past Medical History:  Diagnosis Date  . Bronchitis   . GERD (gastroesophageal reflux disease)    diet controlled , no meds  . Hyperlipidemia    recent dx - currently diet control - no meds  . Hypothyroid 2003   radio active iodine - no meds  . SVD (spontaneous vaginal delivery)    x 1     Social History   Socioeconomic History  . Marital status: Single    Spouse name: Not on file  . Number of children: Not on file  . Years of education: Not on file  . Highest  education level: Not on file  Occupational History  . Not on file  Social Needs  . Financial resource strain: Not on file  . Food insecurity:    Worry: Not on file    Inability: Not on file  . Transportation needs:    Medical: Not on file    Non-medical: Not on file  Tobacco Use  . Smoking status: Never Smoker  . Smokeless tobacco: Never Used  Substance and Sexual Activity  . Alcohol use: No  . Drug use: No  . Sexual activity: Yes    Partners: Male    Birth control/protection: Surgical    Comment: hysterectomy.  Lifestyle  . Physical activity:    Days per week: Not on file    Minutes per session: Not on file  . Stress: Not on file  Relationships  . Social connections:    Talks on phone: Not on file    Gets together: Not on file    Attends religious service: Not on file    Active member of club or organization: Not on file    Attends meetings of clubs or organizations: Not on file  Relationship status: Not on file  . Intimate partner violence:    Fear of current or ex partner: Not on file    Emotionally abused: Not on file    Physically abused: Not on file    Forced sexual activity: Not on file  Other Topics Concern  . Not on file  Social History Narrative   Single.   3 children.   Works at [employer] as a Designer, multimedia.   Enjoys relaxing, exercise, walking her dog.     Past Surgical History:  Procedure Laterality Date  . CESAREAN SECTION     x 2  . CHOLECYSTECTOMY  2008  . DILATION AND CURETTAGE OF UTERUS     X 2  . SHOULDER SURGERY  2016   arthroscopic  . TOTAL ABDOMINAL HYSTERECTOMY  2006   Fibroids, benign pathology  . TUBAL LIGATION    . WISDOM TOOTH EXTRACTION     at age 67 yr  . WRIST ARTHROSCOPY Right 2007    Family History  Problem Relation Age of Onset  . Hypertension Mother   . Breast cancer Maternal Aunt   . Hypertension Maternal Grandmother   . Heart failure Maternal Grandmother   . Lung cancer Maternal Grandmother   . Colon cancer Neg Hx   .  Colon polyps Neg Hx   . Rectal cancer Neg Hx   . Stomach cancer Neg Hx     No Known Allergies  Current Outpatient Medications on File Prior to Visit  Medication Sig Dispense Refill  . ibuprofen (ADVIL,MOTRIN) 600 MG tablet Take 1 tablet (600 mg total) by mouth every 6 (six) hours as needed. (Patient not taking: Reported on 07/08/2017) 40 tablet 0   Current Facility-Administered Medications on File Prior to Visit  Medication Dose Route Frequency Provider Last Rate Last Dose  . 0.9 %  sodium chloride infusion  500 mL Intravenous Once Gatha Mayer, MD        BP 118/74   Pulse 71   Temp 98.4 F (36.9 C) (Oral)   Ht 5\' 6"  (1.676 m)   Wt 171 lb 8 oz (77.8 kg)   SpO2 99%   BMI 27.68 kg/m    Objective:   Physical Exam  Constitutional: She appears well-nourished.  Neck: Neck supple.  Cardiovascular: Normal rate and regular rhythm.  Pulmonary/Chest: Effort normal and breath sounds normal.  Skin: Skin is warm and dry.          Assessment & Plan:

## 2017-07-08 NOTE — Assessment & Plan Note (Signed)
Improved in the office today, also during GYN visit in March 2019. Commended her on changes in diet, discussed to start exercising. Will continue to monitor.

## 2017-07-09 LAB — LIPID PANEL
CHOLESTEROL: 196 mg/dL (ref 0–200)
HDL: 62.2 mg/dL (ref >39.00)
LDL CALC: 121 mg/dL — AB (ref 0–99)
NonHDL: 133.48
TRIGLYCERIDES: 61 mg/dL (ref 0.0–149.0)
Total CHOL/HDL Ratio: 3
VLDL: 12.2 mg/dL (ref 0.0–40.0)

## 2017-07-18 ENCOUNTER — Ambulatory Visit (HOSPITAL_COMMUNITY)
Admission: RE | Admit: 2017-07-18 | Discharge: 2017-07-18 | Disposition: A | Discharge status: Home / Self Care | Payer: 59 | Source: Ambulatory Visit | Attending: Obstetrics & Gynecology | Admitting: Obstetrics & Gynecology

## 2017-07-18 DIAGNOSIS — Z90710 Acquired absence of both cervix and uterus: Secondary | ICD-10-CM | POA: Insufficient documentation

## 2017-07-18 DIAGNOSIS — R1030 Lower abdominal pain, unspecified: Secondary | ICD-10-CM | POA: Insufficient documentation

## 2017-07-22 ENCOUNTER — Telehealth: Payer: Self-pay | Admitting: Obstetrics & Gynecology

## 2017-07-22 NOTE — Telephone Encounter (Signed)
TC to pt to review results of [place]. No answer. Left message.   Rachna Schonberger L. Harraway-Smith, M.D., [facility]nn June

## 2017-07-23 ENCOUNTER — Telehealth: Payer: Self-pay | Admitting: Obstetrics & Gynecology

## 2017-07-23 NOTE — Telephone Encounter (Signed)
Pt returned call to get results about her [place]. She cont to have pain. She was aware of the bilateral hydrosalpinx and is interested in surgical removal due to the pain.   Patient desires surgical management with laparoscopy with bilateral salpingectomy.  The risks of surgery were discussed in detail with the patient including but not limited to: bleeding which may require transfusion or reoperation; infection which may require prolonged hospitalization or re-hospitalization and antibiotic therapy; injury to bowel, bladder, ureters and major vessels or other surrounding organs; need for additional procedures including laparotomy; thromboembolic phenomenon, incisional problems and other postoperative or anesthesia complications.  Patient was told that the likelihood that her condition and symptoms will be treated effectively with this surgical management was very high; the postoperative expectations were also discussed in detail. The patient also understands the alternative treatment options which were discussed in full. All questions were answered.  She was told that she will be contacted by our surgical scheduler regarding the time and date of her surgery; routine preoperative instructions of having nothing to eat or drink after midnight on the day prior to surgery and also coming to the hospital 1 1/2 hours prior to her time of surgery were also emphasized.  She was told she may be called for a preoperative appointment about a week prior to surgery and will be given further preoperative instructions at that visit. Printed patient education handouts about the procedure were given to the patient to review at home.  Ashley [name], M.D., Cherlynn June

## 2017-07-25 ENCOUNTER — Encounter (HOSPITAL_BASED_OUTPATIENT_CLINIC_OR_DEPARTMENT_OTHER): Payer: Self-pay | Admitting: Emergency Medicine

## 2017-07-25 ENCOUNTER — Other Ambulatory Visit: Payer: Self-pay

## 2017-07-25 NOTE — Progress Notes (Signed)
SPOKE WITH: patient RIDING HOME WITH: son Marguerite Olea [phone] AM MEDICATIONS: none  NPO STATUS: npo after midnight LABS: cbc pre-op; urine preg day of surgery  COMMENTS/CONCERNS: n/a

## 2017-07-28 ENCOUNTER — Encounter (HOSPITAL_COMMUNITY)
Admission: RE | Admit: 2017-07-28 | Discharge: 2017-07-28 | Disposition: A | Discharge status: Home / Self Care | Payer: 59 | Source: Ambulatory Visit | Attending: Obstetrics & Gynecology | Admitting: Obstetrics & Gynecology

## 2017-07-28 DIAGNOSIS — N8302 Follicular cyst of left ovary: Secondary | ICD-10-CM | POA: Diagnosis not present

## 2017-07-28 DIAGNOSIS — E785 Hyperlipidemia, unspecified: Secondary | ICD-10-CM | POA: Diagnosis not present

## 2017-07-28 DIAGNOSIS — K219 Gastro-esophageal reflux disease without esophagitis: Secondary | ICD-10-CM | POA: Diagnosis not present

## 2017-07-28 DIAGNOSIS — N7011 Chronic salpingitis: Secondary | ICD-10-CM | POA: Diagnosis not present

## 2017-07-28 DIAGNOSIS — Z8249 Family history of ischemic heart disease and other diseases of the circulatory system: Secondary | ICD-10-CM | POA: Diagnosis not present

## 2017-07-28 DIAGNOSIS — M1812 Unilateral primary osteoarthritis of first carpometacarpal joint, left hand: Secondary | ICD-10-CM | POA: Diagnosis not present

## 2017-07-28 LAB — CBC
HCT: 38 % (ref 36.0–46.0)
HEMOGLOBIN: 12.5 g/dL (ref 12.0–15.0)
MCH: 29 pg (ref 26.0–34.0)
MCHC: 32.9 g/dL (ref 30.0–36.0)
MCV: 88.2 fL (ref 78.0–100.0)
Platelets: 235 10*3/uL (ref 150–400)
RBC: 4.31 MIL/uL (ref 3.87–5.11)
RDW: 14.6 % (ref 11.5–15.5)
WBC: 5.3 10*3/uL (ref 4.0–10.5)

## 2017-07-29 ENCOUNTER — Ambulatory Visit (HOSPITAL_BASED_OUTPATIENT_CLINIC_OR_DEPARTMENT_OTHER): Payer: 59 | Admitting: Anesthesiology

## 2017-07-29 ENCOUNTER — Ambulatory Visit (HOSPITAL_BASED_OUTPATIENT_CLINIC_OR_DEPARTMENT_OTHER)
Admission: RE | Admit: 2017-07-29 | Discharge: 2017-07-29 | Disposition: A | Discharge status: Home / Self Care | Payer: 59 | Source: Ambulatory Visit | Attending: Obstetrics & Gynecology | Admitting: Obstetrics & Gynecology

## 2017-07-29 ENCOUNTER — Encounter (HOSPITAL_BASED_OUTPATIENT_CLINIC_OR_DEPARTMENT_OTHER): Payer: Self-pay | Admitting: *Deleted

## 2017-07-29 ENCOUNTER — Encounter (HOSPITAL_BASED_OUTPATIENT_CLINIC_OR_DEPARTMENT_OTHER)
Admission: RE | Disposition: A | Discharge status: Home / Self Care | Payer: Self-pay | Source: Ambulatory Visit | Attending: Obstetrics & Gynecology

## 2017-07-29 DIAGNOSIS — N8302 Follicular cyst of left ovary: Secondary | ICD-10-CM | POA: Insufficient documentation

## 2017-07-29 DIAGNOSIS — N7011 Chronic salpingitis: Secondary | ICD-10-CM | POA: Insufficient documentation

## 2017-07-29 DIAGNOSIS — N83202 Unspecified ovarian cyst, left side: Secondary | ICD-10-CM | POA: Diagnosis not present

## 2017-07-29 DIAGNOSIS — Z8249 Family history of ischemic heart disease and other diseases of the circulatory system: Secondary | ICD-10-CM | POA: Insufficient documentation

## 2017-07-29 DIAGNOSIS — R102 Pelvic and perineal pain: Secondary | ICD-10-CM

## 2017-07-29 DIAGNOSIS — K219 Gastro-esophageal reflux disease without esophagitis: Secondary | ICD-10-CM | POA: Insufficient documentation

## 2017-07-29 DIAGNOSIS — M1812 Unilateral primary osteoarthritis of first carpometacarpal joint, left hand: Secondary | ICD-10-CM | POA: Insufficient documentation

## 2017-07-29 DIAGNOSIS — E785 Hyperlipidemia, unspecified: Secondary | ICD-10-CM | POA: Insufficient documentation

## 2017-07-29 HISTORY — PX: LAPAROSCOPIC BILATERAL SALPINGECTOMY: SHX5889

## 2017-07-29 HISTORY — DX: Primary osteoarthritis, unspecified hand: M19.049

## 2017-07-29 HISTORY — DX: Chronic salpingitis: N70.11

## 2017-07-29 LAB — POCT PREGNANCY, URINE: Preg Test, Ur: NEGATIVE

## 2017-07-29 SURGERY — SALPINGECTOMY, BILATERAL, LAPAROSCOPIC
Anesthesia: General | Site: Abdomen | Laterality: Bilateral

## 2017-07-29 MED ORDER — MIDAZOLAM HCL 2 MG/2ML IJ SOLN
INTRAMUSCULAR | Status: AC
  Filled 2017-07-29: qty 2

## 2017-07-29 MED ORDER — IBUPROFEN 800 MG PO TABS: 800.0000 mg | ORAL_TABLET | Freq: Three times a day (TID) | ORAL | 0 refills | Status: DC | PRN

## 2017-07-29 MED ORDER — HYDROMORPHONE HCL 1 MG/ML IJ SOLN
INTRAMUSCULAR | Status: AC
  Filled 2017-07-29: qty 1

## 2017-07-29 MED ORDER — PROMETHAZINE HCL 25 MG/ML IJ SOLN
6.2500 mg | INTRAMUSCULAR | Status: DC | PRN
  Filled 2017-07-29: qty 1

## 2017-07-29 MED ORDER — OXYCODONE HCL 5 MG/5ML PO SOLN
5.0000 mg | Freq: Once | ORAL | Status: AC | PRN
  Filled 2017-07-29: qty 5

## 2017-07-29 MED ORDER — DEXAMETHASONE SODIUM PHOSPHATE 4 MG/ML IJ SOLN
INTRAMUSCULAR | Status: DC | PRN
  Administered 2017-07-29: 10 mg via INTRAVENOUS

## 2017-07-29 MED ORDER — PROPOFOL 10 MG/ML IV BOLUS
INTRAVENOUS | Status: AC
  Filled 2017-07-29: qty 20

## 2017-07-29 MED ORDER — HYDROMORPHONE HCL 1 MG/ML IJ SOLN
0.2500 mg | INTRAMUSCULAR | Status: DC | PRN
  Administered 2017-07-29 (×4): 0.5 mg via INTRAVENOUS
  Filled 2017-07-29: qty 0.5

## 2017-07-29 MED ORDER — SOD CITRATE-CITRIC ACID 500-334 MG/5ML PO SOLN
ORAL | Status: AC
Start: 2017-07-29 — End: ?
  Filled 2017-07-29: qty 15

## 2017-07-29 MED ORDER — MEPERIDINE HCL 25 MG/ML IJ SOLN
INTRAMUSCULAR | Status: AC
  Filled 2017-07-29: qty 1

## 2017-07-29 MED ORDER — OXYCODONE HCL 5 MG PO TABS
5.0000 mg | ORAL_TABLET | Freq: Once | ORAL | Status: AC | PRN
  Administered 2017-07-29: 5 mg via ORAL
  Filled 2017-07-29: qty 1

## 2017-07-29 MED ORDER — FENTANYL CITRATE (PF) 100 MCG/2ML IJ SOLN
INTRAMUSCULAR | Status: DC | PRN
  Administered 2017-07-29 (×2): 50 ug via INTRAVENOUS
  Administered 2017-07-29: 100 ug via INTRAVENOUS

## 2017-07-29 MED ORDER — SUCCINYLCHOLINE CHLORIDE 200 MG/10ML IV SOSY
PREFILLED_SYRINGE | INTRAVENOUS | Status: AC
  Filled 2017-07-29: qty 10

## 2017-07-29 MED ORDER — KETOROLAC TROMETHAMINE 30 MG/ML IJ SOLN
INTRAMUSCULAR | Status: AC
  Filled 2017-07-29: qty 1

## 2017-07-29 MED ORDER — ONDANSETRON HCL 4 MG/2ML IJ SOLN
INTRAMUSCULAR | Status: DC | PRN
  Administered 2017-07-29: 4 mg via INTRAVENOUS

## 2017-07-29 MED ORDER — LIDOCAINE HCL (CARDIAC) PF 100 MG/5ML IV SOSY
PREFILLED_SYRINGE | INTRAVENOUS | Status: DC | PRN
  Administered 2017-07-29: 100 mg via INTRAVENOUS

## 2017-07-29 MED ORDER — LACTATED RINGERS IV SOLN
INTRAVENOUS | Status: DC
  Administered 2017-07-29 (×2): via INTRAVENOUS
  Filled 2017-07-29: qty 1000

## 2017-07-29 MED ORDER — FENTANYL CITRATE (PF) 100 MCG/2ML IJ SOLN
INTRAMUSCULAR | Status: AC
  Filled 2017-07-29: qty 2

## 2017-07-29 MED ORDER — KETOROLAC TROMETHAMINE 30 MG/ML IJ SOLN
INTRAMUSCULAR | Status: DC | PRN
  Administered 2017-07-29: 30 mg via INTRAVENOUS

## 2017-07-29 MED ORDER — OXYCODONE HCL 5 MG PO TABS: 5.0000 mg | ORAL_TABLET | Freq: Four times a day (QID) | ORAL | 0 refills | Status: DC | PRN

## 2017-07-29 MED ORDER — MEPERIDINE HCL 25 MG/ML IJ SOLN
6.2500 mg | INTRAMUSCULAR | Status: DC | PRN
  Filled 2017-07-29: qty 1

## 2017-07-29 MED ORDER — METHYLENE BLUE 0.5 % INJ SOLN
INTRAVENOUS | Status: AC
  Filled 2017-07-29: qty 10

## 2017-07-29 MED ORDER — ONDANSETRON HCL 4 MG/2ML IJ SOLN
INTRAMUSCULAR | Status: AC
  Filled 2017-07-29: qty 2

## 2017-07-29 MED ORDER — LIDOCAINE 2% (20 MG/ML) 5 ML SYRINGE
INTRAMUSCULAR | Status: AC
  Filled 2017-07-29: qty 5

## 2017-07-29 MED ORDER — LACTATED RINGERS IV SOLN
INTRAVENOUS | Status: DC
  Administered 2017-07-29: 08:00:00 via INTRAVENOUS
  Filled 2017-07-29: qty 1000

## 2017-07-29 MED ORDER — MIDAZOLAM HCL 5 MG/5ML IJ SOLN
INTRAMUSCULAR | Status: DC | PRN
  Administered 2017-07-29: 2 mg via INTRAVENOUS

## 2017-07-29 MED ORDER — SOD CITRATE-CITRIC ACID 500-334 MG/5ML PO SOLN
30.0000 mL | ORAL | Status: AC
  Administered 2017-07-29: 30 mL via ORAL
  Filled 2017-07-29: qty 30

## 2017-07-29 MED ORDER — OXYCODONE HCL 5 MG PO TABS
ORAL_TABLET | ORAL | Status: AC
  Filled 2017-07-29: qty 1

## 2017-07-29 MED ORDER — ROCURONIUM BROMIDE 10 MG/ML (PF) SYRINGE
PREFILLED_SYRINGE | INTRAVENOUS | Status: AC
  Filled 2017-07-29: qty 5

## 2017-07-29 MED ORDER — DEXAMETHASONE SODIUM PHOSPHATE 10 MG/ML IJ SOLN
INTRAMUSCULAR | Status: AC
  Filled 2017-07-29: qty 1

## 2017-07-29 MED ORDER — PROPOFOL 10 MG/ML IV BOLUS
INTRAVENOUS | Status: DC | PRN
  Administered 2017-07-29: 50 mg via INTRAVENOUS
  Administered 2017-07-29: 150 mg via INTRAVENOUS

## 2017-07-29 MED ORDER — BUPIVACAINE HCL (PF) 0.5 % IJ SOLN
INTRAMUSCULAR | Status: DC | PRN
  Administered 2017-07-29: 20 mL

## 2017-07-29 MED ORDER — SUCCINYLCHOLINE CHLORIDE 20 MG/ML IJ SOLN
INTRAMUSCULAR | Status: DC | PRN
  Administered 2017-07-29: 120 mg via INTRAVENOUS

## 2017-07-29 MED ORDER — SODIUM CHLORIDE 0.9 % IR SOLN
Status: DC | PRN
  Administered 2017-07-29: 3000 mL

## 2017-07-29 SURGICAL SUPPLY — 33 items
ADH SKN CLS APL DERMABOND .7 (GAUZE/BANDAGES/DRESSINGS) ×1
CABLE HIGH FREQUENCY MONO STRZ (ELECTRODE) IMPLANT
CATH ROBINSON RED A/P 16FR (CATHETERS) IMPLANT
COVER MAYO STAND STRL (DRAPES) ×2 IMPLANT
DERMABOND ADVANCED (GAUZE/BANDAGES/DRESSINGS) ×1
DERMABOND ADVANCED .7 DNX12 (GAUZE/BANDAGES/DRESSINGS) IMPLANT
DURAPREP 26ML APPLICATOR (WOUND CARE) ×2 IMPLANT
GLOVE BIO SURGEON STRL SZ7 (GLOVE) ×2 IMPLANT
GLOVE BIOGEL PI IND STRL 7.0 (GLOVE) ×4 IMPLANT
GLOVE BIOGEL PI INDICATOR 7.0 (GLOVE) ×4
GOWN STRL REUS W/TWL LRG LVL3 (GOWN DISPOSABLE) ×6 IMPLANT
NEEDLE INSUFFLATION 120MM (ENDOMECHANICALS) ×2 IMPLANT
NS IRRIG 1000ML POUR BTL (IV SOLUTION) ×2 IMPLANT
PACK LAPAROSCOPY BASIN (CUSTOM PROCEDURE TRAY) ×2 IMPLANT
PACK TRENDGUARD 450 HYBRID PRO (MISCELLANEOUS) IMPLANT
PROTECTOR NERVE ULNAR (MISCELLANEOUS) ×4 IMPLANT
SET IRRIG TUBING LAPAROSCOPIC (IRRIGATION / IRRIGATOR) ×1 IMPLANT
SET TRI-LUMEN FLTR TB AIRSEAL (TUBING) IMPLANT
SHEARS HARMONIC ACE PLUS 36CM (ENDOMECHANICALS) IMPLANT
SUT VIC AB 3-0 X1 27 (SUTURE) ×2 IMPLANT
SUT VICRYL 0 UR6 27IN ABS (SUTURE) ×1 IMPLANT
SUT VICRYL 4-0 PS2 18IN ABS (SUTURE) ×2 IMPLANT
SYR 10ML LL (SYRINGE) ×2 IMPLANT
SYS BAG RETRIEVAL 10MM (BASKET) ×2
SYSTEM BAG RETRIEVAL 10MM (BASKET) IMPLANT
TOWEL OR 17X24 6PK STRL BLUE (TOWEL DISPOSABLE) ×4 IMPLANT
TRAY FOLEY CATH SILVER 14FR (SET/KITS/TRAYS/PACK) ×1 IMPLANT
TRENDGUARD 450 HYBRID PRO PACK (MISCELLANEOUS) ×2
TROCAR OPTI TIP 5M 100M (ENDOMECHANICALS) ×5 IMPLANT
TROCAR PORT AIRSEAL 5X120 (TROCAR) IMPLANT
TROCAR XCEL DIL TIP R 11M (ENDOMECHANICALS) ×1 IMPLANT
TUBING INSUF HEATED (TUBING) ×1 IMPLANT
WARMER LAPAROSCOPE (MISCELLANEOUS) ×2 IMPLANT

## 2017-07-29 NOTE — Op Note (Signed)
07/29/2017  12:04 PM  PATIENT:  Ashley [name]  52 y.o. female  PRE-OPERATIVE DIAGNOSIS:  Hydrosalpinx  POST-OPERATIVE DIAGNOSIS:  Hydrosalpinx  PROCEDURE:  Procedure(s): LAPAROSCOPIC LEFT SALPINGOOPHRECTOMY: Partial right Salpingectomy (Bilateral)  SURGEON:  Surgeon(s) and Role:    * Lavonia Drafts, MD - Primary    * Dove, Wilhemina Cash, MD - Assisting  ANESTHESIA:   general  EBL:  5 mL   BLOOD ADMINISTERED:none  DRAINS: none   LOCAL MEDICATIONS USED:  MARCAINE     SPECIMEN:  Source of Specimen:  right fallopian tube segment; left fallopian tube and ovary . Marland Kitchen   DISPOSITION OF SPECIMEN:  PATHOLOGY  COUNTS:  YES  TOURNIQUET:  * No tourniquets in log *  DICTATION: .Note written in [place]: Discharge to home after PACU  PATIENT DISPOSITION:  PACU - hemodynamically stable.   Delay start of Pharmacological VTE agent (>24hrs) due to surgical blood loss or risk of bleeding: not applicable  Complications: none immediate  INDICATIONS: 52 y.o. [id number] with aforementioned preoperative diagnoses here today for definitive surgical management.   Risks of surgery were discussed with the patient including but not limited to: bleeding which may require transfusion or reoperation; infection which may require antibiotics; injury to bowel, bladder, ureters or other surrounding organs; need for additional procedures including laparotomy; thromboembolic phenomenon, incisional problems and other postoperative/anesthesia complications. Written informed consent was obtained.    FINDINGS:  The uterus was surgically absent.  left adnexa with hydrosalpinx and ovarian cyst. Normal right adnexa. The fallopian tube was adherent to the right side wall. The fimbriated end was free and not adherent. No evidence of endometriosis, adhesions or any other abdominal/pelvic abnormality.  Normal upper abdomen.  PROCEDURE IN DETAIL:  The patient had sequential compression devices applied to her  lower extremities while in the preoperative area.  She was then taken to the operating room where general anesthesia was administered and was found to be adequate.  She was placed in the dorsal lithotomy position, and was prepped and draped in a sterile manner.  A Foley catheter was inserted into her bladder and attached to constant drainage and a sponge on a stick was placed in the vagina. After an adequate timeout was performed, attention was then turned to the patient's abdomen where a 5-mm skin incision was made in the umbilical fold.  The Veress needle was carefully introduced into the peritoneal cavity through the abdominal wall.  Intraperitoneal placement was confirmed by drop in intraabdominal pressure with insufflation of carbon dioxide gas.  Adequate pneumoperitoneum was obtained, and the 66mm trocar and sleeve were then advanced without difficulty into the abdomen where intraabdominal placement was confirmed by the laparoscope. A survey of the patient's pelvis and abdomen revealed the findings above.   Bilateral 5-mm lower quadrant ports were then placed under direct visualization.  The left lower quadrant port was subsequently changed to a 22mm. On the left side, the infundibulopelvic ligament ligament was then clamped and transected with the Harmonic device after the above findings were noted allowing for salpingooophorectomy.  The fimbriated end of the right fallopian tube was grasped with the Harmonic scalpel and transected with excellent hemostasis. The specimen was then removed from the abdomen through an 11-mm port using an Endocatch bag, under direct visualization.  The operative site was surveyed, and it was found to be hemostatic.  No intraoperative injury to other surrounding organs was noted.  The abdomen was desufflated and all instruments were then removed from  the patient's abdomen. The fascial incision of the left lower quadrant port was closed with 0 vicryl and the skin incision was closed  with 3-0 Vicryl. The other ports were reapproximated using fibrin glue. 30cc of .5% Marcaine was injected into the port sites.   The patient will be discharged to home as per PACU criteria.  Routine postoperative instructions given.  She was prescribed Percocet and Ibuprofen.    Shruti Arrey L. Harraway-Smith, M.D., [facility]

## 2017-07-29 NOTE — Brief Op Note (Signed)
07/29/2017  12:04 PM  PATIENT:  Gerrit Friends  52 y.o. female  PRE-OPERATIVE DIAGNOSIS:  Hydrosalpinx  POST-OPERATIVE DIAGNOSIS:  Hydrosalpinx  PROCEDURE:  Procedure(s): LAPAROSCOPIC LEFT SALPINGOOPHRECTOMY: Partial right Salpingectomy (Bilateral)  SURGEON:  Surgeon(s) and Role:    * Lavonia Drafts, MD - Primary    * Dove, Wilhemina Cash, MD - Assisting  ANESTHESIA:   general  EBL:  5 mL   BLOOD ADMINISTERED:none  DRAINS: none   LOCAL MEDICATIONS USED:  MARCAINE     SPECIMEN:  Source of Specimen:  right fallopian tube segment; left fallopian tube and ovary . Marland Kitchen   DISPOSITION OF SPECIMEN:  PATHOLOGY  COUNTS:  YES  TOURNIQUET:  * No tourniquets in log *  DICTATION: .Note written in [place]: Discharge to home after PACU  PATIENT DISPOSITION:  PACU - hemodynamically stable.   Delay start of Pharmacological VTE agent (>24hrs) due to surgical blood loss or risk of bleeding: not applicable  Complications: none immediate  Britton Bera L. Harraway-Smith, M.D., [facility]

## 2017-07-29 NOTE — Discharge Instructions (Signed)
Unilateral Salpingo-Oophorectomy, Care After Refer to this sheet in the next few weeks. These instructions provide you with information on caring for yourself after your procedure. Your health care provider may also give you more specific instructions. Your treatment has been planned according to current medical practices, but problems sometimes occur. Call your health care provider if you have any problems or questions after your procedure. What can I expect after the procedure? After the procedure, it is typical to have the following:  Abdominal pain that can be controlled with pain medicine.  Vaginal spotting.  Constipation.  Follow these instructions at home:  Get plenty of rest and sleep.  Only take over-the-counter or prescription medicines as directed by your health care provider. Do not take aspirin. It can cause bleeding.  Keep incision areas clean and dry. Remove or change any bandages (dressings) only as directed by your health care provider.  Follow your health care provider's advice regarding diet.  Drink enough fluids to keep your urine clear or pale yellow.  Limit exercise and activities as directed by your health care provider. Do not lift anything heavier than 5 pounds (2.3 kg) until your health care provider approves.  Do not drive until your health care provider approves.  Do not drink alcohol until your health care provider approves.  Do not have sexual intercourse until your health care provider says it is OK.  Take your temperature twice a day and write it down.  If you become constipated, you may: ? Ask your health care provider about taking a mild laxative. ? Add more fruit and bran to your diet. ? Drink more fluids.  Follow up with your health care provider as directed. Contact a health care provider if:  You have swelling or redness in the incision area.  You develop a rash.  You feel lightheaded.  You have pain that is not controlled with  medicine.  You have pain, swelling, or redness where the IV access tube was placed. Get help right away if:  You have a fever.  You develop increasing abdominal pain.  You see pus coming out of the incision, or the incision is separating.  You notice a bad smell coming from the wound or dressing.  You have excessive vaginal bleeding.  You feel sick to your stomach (nauseous) and vomit.  You have leg or chest pain.  You have pain when you urinate.  You develop shortness of breath.  You pass out. This information is not intended to replace advice given to you by your health care provider. Make sure you discuss any questions you have with your health care provider. Document Released: 12/08/2008 Document Revised: 01/12/2016 Document Reviewed: 08/05/2012 Elsevier Interactive Patient Education  2018 [facility] Instructions  Activity: Get plenty of rest for the remainder of the day. A responsible individual must stay with you for 24 hours following the procedure.  For the next 24 hours, DO NOT: -Drive a car -Paediatric nurse -Drink alcoholic beverages -Take any medication unless instructed by your physician -Make any legal decisions or sign important papers.  Meals: Start with liquid foods such as gelatin or soup. Progress to regular foods as tolerated. Avoid greasy, spicy, heavy foods. If nausea and/or vomiting occur, drink only clear liquids until the nausea and/or vomiting subsides. Call your physician if vomiting continues.  Special Instructions/Symptoms: Your throat may feel dry or sore from the anesthesia or the breathing tube placed in your throat during surgery. If  this causes discomfort, gargle with warm salt water. The discomfort should disappear within 24 hours.  If you had a scopolamine patch placed behind your ear for the management of post- operative nausea and/or vomiting:  1. The medication in the patch is effective for 72  hours, after which it should be removed.  Wrap patch in a tissue and discard in the trash. Wash hands thoroughly with soap and water. 2. You may remove the patch earlier than 72 hours if you experience unpleasant side effects which may include dry mouth, dizziness or visual disturbances. 3. Avoid touching the patch. Wash your hands with soap and water after contact with the patch.

## 2017-07-29 NOTE — H&P (Signed)
Preoperative History and Physical  Ashley [name] is a 52 y.o. [id number] here for surgical management of bilateral HYDROSALPINGES  Proposed surgery: bilateral salpingectomy  Past Medical History:  Diagnosis Date  . Bronchitis    2 years ago   . [place] arthritis 2019   base of left thumb   . GERD (gastroesophageal reflux disease)    diet controlled , no meds  . Hydrosalpinx    bilateral   . Hyperlipidemia    recent dx - currently diet control - no meds  . Hypothyroid 2003   radio active iodine - no meds  . SVD (spontaneous vaginal delivery)    x 1   Past Surgical History:  Procedure Laterality Date  . CESAREAN SECTION     x 2  . CHOLECYSTECTOMY  2008  . DILATION AND CURETTAGE OF UTERUS     X 2  . SHOULDER SURGERY  2016   arthroscopic  . TOTAL ABDOMINAL HYSTERECTOMY  2006   Fibroids, benign pathology; partial hysterectomy per patient   . TUBAL LIGATION    . WISDOM TOOTH EXTRACTION     at age 28 yr  . WRIST ARTHROSCOPY Right 2007   OB History    Gravida  5   Para  3   Term  3   Preterm  0   AB  2   Living  3     SAB  2   TAB  0   Ectopic  0   Multiple  0   Live Births  3          Patient denies any cervical dysplasia or STIs. Facility-Administered Medications Prior to Admission  Medication Dose Route Frequency Provider Last Rate Last Dose  . 0.9 %  sodium chloride infusion  500 mL Intravenous Once Gatha Mayer, MD       Medications Prior to Admission  Medication Sig Dispense Refill Last Dose  . ibuprofen (ADVIL,MOTRIN) 600 MG tablet Take 1 tablet (600 mg total) by mouth every 6 (six) hours as needed. 40 tablet 0 Past Week at Unknown time    No Known Allergies Social History:   reports that she has never smoked. She has never used smokeless tobacco. She reports that she does not drink alcohol or use drugs. Family History  Problem Relation Age of Onset  . Hypertension Mother   . Breast cancer Maternal Aunt   . Hypertension Maternal  Grandmother   . Heart failure Maternal Grandmother   . Lung cancer Maternal Grandmother   . Colon cancer Neg Hx   . Colon polyps Neg Hx   . Rectal cancer Neg Hx   . Stomach cancer Neg Hx     Review of Systems: Noncontributory  PHYSICAL EXAM: Blood pressure (!) 151/98, pulse 80, temperature 98.8 F (37.1 C), temperature source Oral, resp. rate 16, height 5\' 6"  (1.676 m), weight 170 lb 8 oz (77.3 kg), SpO2 100 %. General appearance - alert, well appearing, and in no distress Chest - clear to auscultation, no wheezes, rales or rhonchi, symmetric air entry Heart - normal rate and regular rhythm Abdomen - soft, nontender, nondistended, no masses or organomegaly Pelvic - examination not indicated Extremities - peripheral pulses normal, no pedal edema, no clubbing or cyanosis  Labs: Results for orders placed or performed during the hospital encounter of 07/29/17 (from the past 336 hour(s))  CBC   Collection Time: 07/28/17 10:10 AM  Result Value Ref Range   WBC 5.3 4.0 - 10.5 K/uL  RBC 4.31 3.87 - 5.11 MIL/uL   Hemoglobin 12.5 12.0 - 15.0 g/dL   HCT 38.0 36.0 - 46.0 %   MCV 88.2 78.0 - 100.0 fL   MCH 29.0 26.0 - 34.0 pg   MCHC 32.9 30.0 - 36.0 g/dL   RDW 14.6 11.5 - 15.5 %   Platelets 235 150 - 400 K/uL  Pregnancy, urine POC   Collection Time: 07/29/17  8:06 AM  Result Value Ref Range   Preg Test, Ur NEGATIVE NEGATIVE    Imaging Studies: US Pelvis Transvanginal Non-ob (tv Only)  Result Date: 07/18/2017 CLINICAL DATA:  Abdominal pain in lower abdomen, history of adnexal masses by prior ultrasound EXAM: ULTRASOUND PELVIS TRANSVAGINAL TECHNIQUE: Transvaginal ultrasound examination of the pelvis was performed including evaluation of the uterus, ovaries, adnexal regions, and pelvic cul-de-sac. COMPARISON:  01/24/2017 FINDINGS: Uterus Surgically absent Endometrium N/A Right ovary Measurements: 2.2 x 1.0 x 1.3 cm.  Normal morphology without mass Left ovary Measurements: 2.5 x 2.1 x 1.9  cm. Dominant follicle without additional mass Other findings: No free pelvic fluid. Tubular hypoechoic fluid-filled structures in the adnexa bilaterally likely represent segments of hydrosalpinx, little changed. 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 Unremarkable ovaries. Post hysterectomy. Electronically Signed   By: Lavonia Dana M.D.   On: 07/18/2017 15:46    Assessment: Patient Active Problem List   Diagnosis Date Noted  . Hyperlipidemia 03/19/2017  . Elevated blood pressure reading 03/19/2017  . Allergic rhinitis 03/19/2017  . Ovarian cyst, left 02/23/2016  . Pelvic pain 02/07/2016    Plan: Patient will undergo surgical management with laparoscopic bilateral salpingectomy. The risks of surgery were discussed in detail with the patient including but not limited to: bleeding which may require transfusion or reoperation; infection which may require antibiotics; injury to surrounding organs which may involve bowel, bladder, ureters ; need for additional procedures including laparoscopy or laparotomy; thromboembolic phenomenon, surgical site problems and other postoperative/anesthesia complications. Likelihood of success in alleviating the patient's condition was discussed. Routine postoperative instructions will be reviewed with the patient and her family in detail after surgery.  The patient concurred with the proposed plan, giving informed written consent for the surgery.  Patient has been NPO since last night she will remain NPO for procedure.  Anesthesia and OR aware.  Preoperative prophylactic antibiotics and SCDs ordered on call to the OR.  To OR when ready.  Zainah Steven L. Harraway-Smith, M.D., [facility] 07/29/2017 10:09 AM

## 2017-07-29 NOTE — Brief Op Note (Signed)
duplicate

## 2017-07-29 NOTE — Transfer of Care (Signed)
Immediate Anesthesia Transfer of Care Note  Patient: Ashley [name]  Procedure(s) Performed: Procedure(s) (LRB): LAPAROSCOPIC LEFT SALPINGOOPHRECTOMY: Partial right Salpingectomy (Bilateral)  Patient Location: PACU  Anesthesia Type: General  Level of Consciousness: awake, sedated, patient cooperative and responds to stimulation  Airway & Oxygen Therapy: Patient Spontanous Breathing and Patient connected to NCO2  Post-op Assessment: Report given to PACU RN, Post -op Vital signs reviewed and stable and Patient moving all extremities  Post vital signs: Reviewed and stable  Complications: No apparent anesthesia complications

## 2017-07-29 NOTE — Anesthesia Procedure Notes (Signed)
Procedure Name: Intubation Date/Time: 07/29/2017 11:10 AM Performed by: Justice Rocher, CRNA Pre-anesthesia Checklist: Patient identified, Emergency Drugs available, Suction available and Patient being monitored Patient Re-evaluated:Patient Re-evaluated prior to induction Oxygen Delivery Method: Circle system utilized Preoxygenation: Pre-oxygenation with 100% oxygen Induction Type: IV induction and Cricoid Pressure applied Ventilation: Mask ventilation without difficulty Laryngoscope Size: Mac and 3 Grade View: Grade II Tube type: Oral Tube size: 7.0 mm Number of attempts: 1 Airway Equipment and Method: Stylet and Oral airway Placement Confirmation: ETT inserted through vocal cords under direct vision,  positive ETCO2 and breath sounds checked- equal and bilateral Secured at: 21 cm Tube secured with: Tape Dental Injury: Teeth and Oropharynx as per pre-operative assessment

## 2017-07-29 NOTE — Anesthesia Preprocedure Evaluation (Signed)
Anesthesia Evaluation  Patient identified by MRN, date of birth, ID band Patient awake    Reviewed: Allergy & Precautions, NPO status , Patient's Chart, lab work & pertinent test results  Airway Mallampati: II  TM Distance: >3 FB Neck ROM: Full    Dental no notable dental hx.    Pulmonary neg pulmonary ROS,    Pulmonary exam normal breath sounds clear to auscultation       Cardiovascular negative cardio ROS Normal cardiovascular exam Rhythm:Regular Rate:Normal     Neuro/Psych negative neurological ROS  negative psych ROS   GI/Hepatic negative GI ROS, Neg liver ROS, GERD  ,  Endo/Other  negative endocrine ROS  Renal/GU negative Renal ROS  negative genitourinary   Musculoskeletal negative musculoskeletal ROS (+)   Abdominal   Peds negative pediatric ROS (+)  Hematology negative hematology ROS (+)   Anesthesia Other Findings   Reproductive/Obstetrics negative OB ROS                             Anesthesia Physical Anesthesia Plan  ASA: II  Anesthesia Plan: General   Post-op Pain Management:    Induction: Intravenous  PONV Risk Score and Plan: 3 and Ondansetron, Dexamethasone and Midazolam  Airway Management Planned: Oral ETT  Additional Equipment:   Intra-op Plan:   Post-operative Plan: Extubation in OR  Informed Consent: I have reviewed the patients History and Physical, chart, labs and discussed the procedure including the risks, benefits and alternatives for the proposed anesthesia with the patient or authorized representative who has indicated his/her understanding and acceptance.   Dental advisory given  Plan Discussed with: CRNA  Anesthesia Plan Comments:         Anesthesia Quick Evaluation

## 2017-07-30 NOTE — Anesthesia Postprocedure Evaluation (Signed)
Anesthesia Post Note  Patient: Ashley [name]  Procedure(s) Performed: LAPAROSCOPIC LEFT SALPINGOOPHRECTOMY: Partial right Salpingectomy (Bilateral Abdomen)     Patient location during evaluation: PACU Anesthesia Type: General Level of consciousness: awake and alert Pain management: pain level controlled Vital Signs Assessment: post-procedure vital signs reviewed and stable Respiratory status: spontaneous breathing, nonlabored ventilation and respiratory function stable Cardiovascular status: blood pressure returned to baseline and stable Postop Assessment: no apparent nausea or vomiting Anesthetic complications: no    Last Vitals:  Vitals:   07/29/17 1330 07/29/17 1345  BP: (!) 148/91   Pulse: 83 91  Resp: 10 10  Temp:    SpO2: 96% 100%    Last Pain:  Vitals:   07/29/17 1500  TempSrc:   PainSc: [name]

## 2017-07-31 ENCOUNTER — Encounter (HOSPITAL_BASED_OUTPATIENT_CLINIC_OR_DEPARTMENT_OTHER): Payer: Self-pay | Admitting: Obstetrics & Gynecology

## 2017-08-01 ENCOUNTER — Telehealth: Payer: Self-pay | Admitting: *Deleted

## 2017-08-01 NOTE — Telephone Encounter (Signed)
Pt called to office stating that she is still having pain and needs to know about going back to work.  Discussed with pt, pt states little to no relief with Ibuprofen and pain medication.  Pt states pain medication "knocks her our".   Pt states she was trying to go back to work on Monday but doesn't feel that she can the way she is feeling/pain.  Pt states she will need a letter from provider stating that she may return.  Pt advised to be seen at [facility] for eval due to severe pain.' Pt made aware nurse will discuss with H-S next week and determine if pt may return to work. Pt made aware she may expect phone call next week to discuss.

## 2017-08-04 ENCOUNTER — Telehealth: Payer: Self-pay

## 2017-08-04 NOTE — Telephone Encounter (Signed)
Patient made aware that Dr. Ihor Dow said she can return to work in two weeks post op. Patient will pick up return to work note on 08-11-17. Kathrene Alu RN

## 2017-08-04 NOTE — Telephone Encounter (Signed)
Patient called last week wanting to know when she should return to work. Patient states this was not discussed with provider.    (had laparoscopic left salpingoopherectomy  On 07-29-17).

## 2017-08-12 ENCOUNTER — Telehealth: Payer: Self-pay

## 2017-08-12 NOTE — Telephone Encounter (Signed)
Patient called this morning and states she went back to work with no restrictions yesterday and she was in pain on her left side all day.   I asked her what she does and she states she works sitting all day at a computer and does some walking. Patient states then also states she works 10a- 6:30pm and then works at night from 9p-5am. Five days a week.  Questioned patient when she sleeps and she states inbetween jobs- so therefore only gets like 3 hours of sleep at a time. Encouraged patient that this is not the recommended amount of sleep need for a normal adult let alone someone who is trying to heal.  Informed patient that Dr. Ihor Dow is out of office and will route to another provider for input, just because there is no other way to reduce her restrictions as far as activity goes since she sits at a desk. Patient wondering if we can reduce her # of work hours. Kathrene Alu RN

## 2017-08-12 NOTE — Telephone Encounter (Signed)
Pt came into the office stating that she was in pain after work last night. Pt states that she  is still taking her ibuprofen but is done taking her percocet. Pt thinks that she needs more time out of work because she is having pain on her left side. Pt asked to see another doctor since Doctor Maylene Roes- Tamala Julian is out of the office. Scheduled pt an appt with Dr. Elly Modena on 08/14/17 at 10:00am.

## 2017-08-14 ENCOUNTER — Ambulatory Visit (INDEPENDENT_AMBULATORY_CARE_PROVIDER_SITE_OTHER): Payer: 59 | Admitting: Obstetrics and Gynecology

## 2017-08-14 ENCOUNTER — Encounter: Payer: Self-pay | Admitting: Obstetrics and Gynecology

## 2017-08-14 VITALS — BP 157/90 | HR 80 | Wt 170.0 lb

## 2017-08-14 DIAGNOSIS — Z9889 Other specified postprocedural states: Secondary | ICD-10-CM

## 2017-08-14 LAB — POCT URINALYSIS DIPSTICK
Bilirubin, UA: NEGATIVE
Glucose, UA: NEGATIVE
KETONES UA: NEGATIVE
LEUKOCYTES UA: NEGATIVE
NITRITE UA: NEGATIVE
PROTEIN UA: NEGATIVE
Spec Grav, UA: 1.01 (ref 1.010–1.025)
UROBILINOGEN UA: 1 U/dL

## 2017-08-14 MED ORDER — CYCLOBENZAPRINE HCL 10 MG PO TABS: 10.0000 mg | ORAL_TABLET | Freq: Three times a day (TID) | ORAL | 2 refills | Status: DC | PRN

## 2017-08-14 NOTE — Progress Notes (Signed)
52 yo [id number] s/p laparoscopic LSO and partial right salpingectomy here for post op check. Patient reports some soreness on the left side of her abdomen. She describes the pain as a cramping pain that comes intensely and releases after a few minutes. Her pain occurs mainly while sitting and is relieved with movement. She reports good bowel movement and urination pattern. She reports a good appetite. She denies fever, chills or abnormal drainage from incision.   Past Medical History:  Diagnosis Date  . Bronchitis    2 years ago   . [place] arthritis 2019   base of left thumb   . GERD (gastroesophageal reflux disease)    diet controlled , no meds  . Hydrosalpinx    bilateral   . Hyperlipidemia    recent dx - currently diet control - no meds  . Hypothyroid 2003   radio active iodine - no meds  . SVD (spontaneous vaginal delivery)    x 1   Past Surgical History:  Procedure Laterality Date  . CESAREAN SECTION     x 2  . CHOLECYSTECTOMY  2008  . DILATION AND CURETTAGE OF UTERUS     X 2  . LAPAROSCOPIC BILATERAL SALPINGECTOMY Bilateral 07/29/2017   Procedure: LAPAROSCOPIC LEFT SALPINGOOPHRECTOMY: Partial right Salpingectomy;  Surgeon: Lavonia Drafts, MD;  Location: [facility];  Service: Gynecology;  Laterality: Bilateral;  . SHOULDER SURGERY  2016   arthroscopic  . TOTAL ABDOMINAL HYSTERECTOMY  2006   Fibroids, benign pathology; partial hysterectomy per patient   . TUBAL LIGATION    . WISDOM TOOTH EXTRACTION     at age 53 yr  . WRIST ARTHROSCOPY Right 2007   Family History  Problem Relation Age of Onset  . Hypertension Mother   . Breast cancer Maternal Aunt   . Hypertension Maternal Grandmother   . Heart failure Maternal Grandmother   . Lung cancer Maternal Grandmother   . Colon cancer Neg Hx   . Colon polyps Neg Hx   . Rectal cancer Neg Hx   . Stomach cancer Neg Hx    Social History   Tobacco Use  . Smoking status: Never Smoker  . Smokeless tobacco:  Never Used  Substance Use Topics  . Alcohol use: No  . Drug use: No   ROS See pertinent in HPI Blood pressure (!) 157/90, pulse 80, weight 170 lb (77.1 kg). GENERAL: Well-developed, well-nourished female in no acute distress.  ABDOMEN: Soft, nontender, nondistended. No organomegaly. Incision sites x 3: healing well without erythema, induration or draiange EXTREMITIES: No cyanosis, clubbing, or edema, 2+ distal pulses.  A/P 52 yo s/p LSC LSO and right salpingectomy 2 weeks ago - will check UA and CBC to rule out infectious process - Rx flexeril provided - Reassurance provided and patient advised to remain active - RTC as scheduled for post op check to see Dr. Ihor Dow

## 2017-08-14 NOTE — Progress Notes (Signed)
Patient states she has a fullness in her abdomen. Patient has gone back to work but requesting some more time due to still feeling sore.

## 2017-08-15 LAB — CBC
HEMATOCRIT: 39.6 % (ref 34.0–46.6)
Hemoglobin: 12.9 g/dL (ref 11.1–15.9)
MCH: 28.7 pg (ref 26.6–33.0)
MCHC: 32.6 g/dL (ref 31.5–35.7)
MCV: 88 fL (ref 79–97)
PLATELETS: 272 10*3/uL (ref 150–450)
RBC: 4.49 x10E6/uL (ref 3.77–5.28)
RDW: 15.2 % (ref 12.3–15.4)
WBC: 3.7 10*3/uL (ref 3.4–10.8)

## 2017-08-27 ENCOUNTER — Encounter: Payer: 59 | Admitting: Obstetrics & Gynecology

## 2017-09-12 ENCOUNTER — Ambulatory Visit: Payer: 59 | Admitting: Obstetrics and Gynecology

## 2017-09-12 ENCOUNTER — Encounter: Payer: Self-pay | Admitting: Obstetrics and Gynecology

## 2017-09-12 DIAGNOSIS — N760 Acute vaginitis: Secondary | ICD-10-CM

## 2017-09-13 NOTE — Progress Notes (Signed)
Patient did not keep GYN appointment for 09/12/2017.  Durene Romans MD Attending Center for [employer])

## 2017-10-15 ENCOUNTER — Ambulatory Visit: Payer: 59

## 2017-10-29 ENCOUNTER — Ambulatory Visit
Admission: EM | Admit: 2017-10-29 | Discharge: 2017-10-29 | Disposition: A | Discharge status: Home / Self Care | Payer: 59 | Attending: Family Medicine | Admitting: Family Medicine

## 2017-10-29 ENCOUNTER — Encounter: Payer: Self-pay | Admitting: Emergency Medicine

## 2017-10-29 ENCOUNTER — Other Ambulatory Visit: Payer: Self-pay

## 2017-10-29 DIAGNOSIS — B9689 Other specified bacterial agents as the cause of diseases classified elsewhere: Secondary | ICD-10-CM | POA: Diagnosis not present

## 2017-10-29 DIAGNOSIS — N760 Acute vaginitis: Secondary | ICD-10-CM | POA: Diagnosis not present

## 2017-10-29 LAB — URINALYSIS, COMPLETE (UACMP) WITH MICROSCOPIC
Bacteria, UA: NONE SEEN
Bilirubin Urine: NEGATIVE
GLUCOSE, UA: NEGATIVE mg/dL
Hgb urine dipstick: NEGATIVE
Ketones, ur: NEGATIVE mg/dL
Leukocytes, UA: NEGATIVE
Nitrite: NEGATIVE
PROTEIN: NEGATIVE mg/dL
Specific Gravity, Urine: 1.01 (ref 1.005–1.030)
WBC, UA: NONE SEEN WBC/hpf (ref 0–5)
pH: 6.5 (ref 5.0–8.0)

## 2017-10-29 LAB — WET PREP, GENITAL
Sperm: NONE SEEN
TRICH WET PREP: NONE SEEN
Yeast Wet Prep HPF POC: NONE SEEN

## 2017-10-29 MED ORDER — METRONIDAZOLE 500 MG PO TABS: 500.0000 mg | ORAL_TABLET | Freq: Two times a day (BID) | ORAL | 0 refills | Status: DC

## 2017-10-29 NOTE — Discharge Instructions (Signed)
Urine was normal. No evidence of UTI.  You did have evidence of BV.  Antibiotic as prescribed. No alcohol while taking the antibiotic.  Take care  Dr. Lacinda Axon

## 2017-10-29 NOTE — ED Triage Notes (Signed)
Pt c/o lower abdominal pain, white vaginal discharge and dark yellow urine. Started about a week ago. Mild vaginal itching. Denies dysuria.

## 2017-10-29 NOTE — ED Provider Notes (Signed)
[facility]    CSN: [id number] Arrival date & time: 10/29/17  2993  History   Chief Complaint Chief Complaint  Patient presents with  . Abdominal Pain  . Vaginal Discharge   HPI  52 year old presents with the above complaints.  Patient reports that she has had some lower abdominal pain.  This is been going on for the past week.  Located in suprapubic region.  She reports that she has had some thick vaginal discharge with odor.  She reports urinary frequency.  No dysuria.  No known inciting factor.  Mild vaginal itching.  No known relieving factors.  No other associated symptoms.  No other complaints.  PMH, Surgical Hx, Family Hx, Social History reviewed and updated as below.  Past Medical History:  Diagnosis Date  . Bronchitis    2 years ago   . [place] arthritis 2019   base of left thumb   . GERD (gastroesophageal reflux disease)    diet controlled , no meds  . Hydrosalpinx    bilateral   . Hyperlipidemia    recent dx - currently diet control - no meds  . Hypothyroid 2003   radio active iodine - no meds  . SVD (spontaneous vaginal delivery)    x 1   Patient Active Problem List   Diagnosis Date Noted  . Hyperlipidemia 03/19/2017  . Elevated blood pressure reading 03/19/2017  . Allergic rhinitis 03/19/2017  . Ovarian cyst, left 02/23/2016  . Pelvic pain 02/07/2016   Past Surgical History:  Procedure Laterality Date  . CESAREAN SECTION     x 2  . CHOLECYSTECTOMY  2008  . DILATION AND CURETTAGE OF UTERUS     X 2  . LAPAROSCOPIC BILATERAL SALPINGECTOMY Bilateral 07/29/2017   Procedure: LAPAROSCOPIC LEFT SALPINGOOPHRECTOMY: Partial right Salpingectomy;  Surgeon: Lavonia Drafts, MD;  Location: [place];  Service: Gynecology;  Laterality: Bilateral;  . SHOULDER SURGERY  2016   arthroscopic  . TOTAL ABDOMINAL HYSTERECTOMY  2006   Fibroids, benign pathology; partial hysterectomy per patient   . TUBAL LIGATION    . WISDOM TOOTH  EXTRACTION     at age 42 yr  . WRIST ARTHROSCOPY Right 2007    OB History    Gravida  5   Para  3   Term  3   Preterm  0   AB  2   Living  3     SAB  2   TAB  0   Ectopic  0   Multiple  0   Live Births  3          Home Medications    Prior to Admission medications   Medication Sig Start Date End Date Taking? Authorizing Provider  metroNIDAZOLE (FLAGYL) 500 MG tablet Take 1 tablet (500 mg total) by mouth 2 (two) times daily. 10/29/17   Coral Spikes, DO   Family History Family History  Problem Relation Age of Onset  . Hypertension Mother   . Breast cancer Maternal Aunt   . Hypertension Maternal Grandmother   . Heart failure Maternal Grandmother   . Lung cancer Maternal Grandmother   . Colon cancer Neg Hx   . Colon polyps Neg Hx   . Rectal cancer Neg Hx   . Stomach cancer Neg Hx    Social History Social History   Tobacco Use  . Smoking status: Never Smoker  . Smokeless tobacco: Never Used  Substance Use Topics  . Alcohol use: No  .  Drug use: No   Allergies   Patient has no known allergies.   Review of Systems Review of Systems  Gastrointestinal: Positive for abdominal pain.  Genitourinary: Positive for frequency and vaginal discharge.   Physical Exam Triage Vital Signs ED Triage Vitals  Enc Vitals Group     BP 10/29/17 0940 124/87     Pulse Rate 10/29/17 0940 72     Resp 10/29/17 0940 16     Temp 10/29/17 0940 98 F (36.7 C)     Temp Source 10/29/17 0940 Oral     SpO2 10/29/17 0940 100 %     Weight 10/29/17 0939 168 lb (76.2 kg)     Height 10/29/17 0939 5\' 6"  (1.676 m)     Head Circumference --      Peak Flow --      Pain Score 10/29/17 0938 5     Pain Loc --      Pain Edu? --      Excl. in [place]? --    Updated Vital Signs BP 124/87 (BP Location: Left Arm)   Pulse 72   Temp 98 F (36.7 C) (Oral)   Resp 16   Ht 5\' 6"  (1.676 m)   Wt 76.2 kg   SpO2 100%   BMI 27.12 kg/m   Visual Acuity Right Eye Distance:   Left Eye  Distance:   Bilateral Distance:    Right Eye Near:   Left Eye Near:    Bilateral Near:     Physical Exam  Constitutional: She is oriented to person, place, and time. She appears well-developed. No distress.  Cardiovascular: Normal rate and regular rhythm.  Pulmonary/Chest: Effort normal and breath sounds normal. She has no wheezes. She has no rales.  Abdominal: Soft. She exhibits no distension.  Mild suprapubic tenderness.  Neurological: She is alert and oriented to person, place, and time.  Psychiatric: Her behavior is normal.  Flat affect.  Nursing note and vitals reviewed.  UC Treatments / Results  Labs (all labs ordered are listed, but only abnormal results are displayed) Labs Reviewed  WET PREP, GENITAL - Abnormal; Notable for the following components:      Result Value   Clue Cells Wet Prep HPF POC PRESENT (*)    WBC, Wet Prep HPF POC FEW (*)    All other components within normal limits  URINALYSIS, COMPLETE (UACMP) WITH MICROSCOPIC - Abnormal; Notable for the following components:   Color, Urine STRAW (*)    All other components within normal limits    EKG None  Radiology No results found.  Procedures Procedures (including critical care time)  Medications Ordered in UC Medications - No data to display  Initial Impression / Assessment and Plan / UC Course  I have reviewed the triage vital signs and the nursing notes.  Pertinent labs & imaging results that were available during my care of the patient were reviewed by me and considered in my medical decision making (see chart for details).    52 year old female presents with vaginal discharge and suprapubic pain.  No evidence of UTI.  Bacteria vaginosis noted on wet prep.  Treating with Flagyl.  Supportive care.  Final Clinical Impressions(s) / UC Diagnoses   Final diagnoses:  Bacterial vaginosis     Discharge Instructions     Urine was normal. No evidence of UTI.  You did have evidence of  BV.  Antibiotic as prescribed. No alcohol while taking the antibiotic.  Take care  Dr. Lacinda Axon  ED Prescriptions    Medication Sig Dispense Auth. Provider   metroNIDAZOLE (FLAGYL) 500 MG tablet Take 1 tablet (500 mg total) by mouth 2 (two) times daily. 14 tablet Coral Spikes, DO     Controlled Substance Prescriptions [place] Controlled Substance Registry consulted? Not Applicable   Coral Spikes, DO 10/29/17 1144

## 2018-01-30 ENCOUNTER — Encounter: Payer: Self-pay | Admitting: Family Medicine

## 2018-01-30 ENCOUNTER — Ambulatory Visit: Payer: 59 | Admitting: Family Medicine

## 2018-01-30 VITALS — BP 146/82 | HR 73 | Temp 98.2°F | Ht 66.0 in | Wt 168.5 lb

## 2018-01-30 DIAGNOSIS — R12 Heartburn: Secondary | ICD-10-CM

## 2018-01-30 DIAGNOSIS — J019 Acute sinusitis, unspecified: Secondary | ICD-10-CM | POA: Insufficient documentation

## 2018-01-30 DIAGNOSIS — J0100 Acute maxillary sinusitis, unspecified: Secondary | ICD-10-CM

## 2018-01-30 DIAGNOSIS — K219 Gastro-esophageal reflux disease without esophagitis: Secondary | ICD-10-CM | POA: Insufficient documentation

## 2018-01-30 MED ORDER — FLUTICASONE PROPIONATE 50 MCG/ACT NA SUSP: 2.0000 | Freq: Every day | NASAL | 2 refills | Status: DC

## 2018-01-30 MED ORDER — FLUCONAZOLE 150 MG PO TABS: 150.0000 mg | ORAL_TABLET | Freq: Once | ORAL | 0 refills | Status: AC

## 2018-01-30 MED ORDER — FAMOTIDINE 20 MG PO TABS: 20.0000 mg | ORAL_TABLET | Freq: Two times a day (BID) | ORAL | 3 refills | Status: DC

## 2018-01-30 MED ORDER — AMOXICILLIN 500 MG PO CAPS: 500.0000 mg | ORAL_CAPSULE | Freq: Three times a day (TID) | ORAL | 0 refills | Status: DC

## 2018-01-30 NOTE — Progress Notes (Signed)
Subjective:    Patient ID: Ashley [name], female    DOB: [birth year], 52 y.o.   MRN: [id number]  HPI 52 yo pt of NP Clark here with sinus symptoms   Sinus symptoms started 2 weeks ago  A lot of pnd  Face hurts  Also some discomfort under R jaw -- perhaps a little ear pain (worse to turn head)   Thick nasal d/c  Most pain is below eyes  Throat is not sore  No fever   No allergies that she knows of   Otc: no meds   GERD is worse from pnd  She gets heartburn a lot  Tried prilosec otc -no help  Has not tried H2 blocker   Taking regular ibuprofen for hand OA   Patient Active Problem List   Diagnosis Date Noted  . Heartburn 01/30/2018  . Acute sinusitis 01/30/2018  . Hyperlipidemia 03/19/2017  . Elevated blood pressure reading 03/19/2017  . Allergic rhinitis 03/19/2017  . Ovarian cyst, left 02/23/2016  . Pelvic pain 02/07/2016   Past Medical History:  Diagnosis Date  . Bronchitis    2 years ago   . [place] arthritis 2019   base of left thumb   . GERD (gastroesophageal reflux disease)    diet controlled , no meds  . Hydrosalpinx    bilateral   . Hyperlipidemia    recent dx - currently diet control - no meds  . Hypothyroid 2003   radio active iodine - no meds  . SVD (spontaneous vaginal delivery)    x 1   Past Surgical History:  Procedure Laterality Date  . CESAREAN SECTION     x 2  . CHOLECYSTECTOMY  2008  . DILATION AND CURETTAGE OF UTERUS     X 2  . LAPAROSCOPIC BILATERAL SALPINGECTOMY Bilateral 07/29/2017   Procedure: LAPAROSCOPIC LEFT SALPINGOOPHRECTOMY: Partial right Salpingectomy;  Surgeon: Lavonia Drafts, MD;  Location: [facility];  Service: Gynecology;  Laterality: Bilateral;  . SHOULDER SURGERY  2016   arthroscopic  . TOTAL ABDOMINAL HYSTERECTOMY  2006   Fibroids, benign pathology; partial hysterectomy per patient   . TUBAL LIGATION    . WISDOM TOOTH EXTRACTION     at age 32 yr  . WRIST ARTHROSCOPY Right 2007   Social  History   Tobacco Use  . Smoking status: Never Smoker  . Smokeless tobacco: Never Used  Substance Use Topics  . Alcohol use: No  . Drug use: No   Family History  Problem Relation Age of Onset  . Hypertension Mother   . Breast cancer Maternal Aunt   . Hypertension Maternal Grandmother   . Heart failure Maternal Grandmother   . Lung cancer Maternal Grandmother   . Colon cancer Neg Hx   . Colon polyps Neg Hx   . Rectal cancer Neg Hx   . Stomach cancer Neg Hx    No Known Allergies No current outpatient medications on file prior to visit.   No current facility-administered medications on file prior to visit.     Review of Systems  Constitutional: Positive for appetite change. Negative for fatigue and fever.  HENT: Positive for congestion, ear pain, postnasal drip, rhinorrhea, sinus pressure and sore throat. Negative for nosebleeds.   Eyes: Negative for pain, redness and itching.  Respiratory: Positive for cough. Negative for shortness of breath and wheezing.   Cardiovascular: Negative for chest pain.  Gastrointestinal: Negative for abdominal pain, blood in stool, constipation, diarrhea, nausea and vomiting.  Heartburn   Endocrine: Negative for polyuria.  Genitourinary: Negative for dysuria, frequency and urgency.  Musculoskeletal: Negative for arthralgias and myalgias.  Allergic/Immunologic: Negative for immunocompromised state.  Neurological: Positive for headaches. Negative for dizziness, tremors, syncope, weakness and numbness.  Hematological: Negative for adenopathy. Does not bruise/bleed easily.  Psychiatric/Behavioral: Negative for dysphoric mood. The patient is not nervous/anxious.        Objective:   Physical Exam  Constitutional: She appears well-developed and well-nourished. No distress.  Well appearing   HENT:  Head: Normocephalic and atraumatic.  Right Ear: External ear normal.  Left Ear: External ear normal.  Mouth/Throat: Oropharynx is clear and  moist. No oropharyngeal exudate.  Nares are injected and congested  Bilateral maxillary sinus tenderness  Post nasal drip   Eyes: Pupils are equal, round, and reactive to light. Conjunctivae and EOM are normal. Right eye exhibits no discharge. Left eye exhibits no discharge.  Neck: Normal range of motion. Neck supple.  Cardiovascular: Normal rate and regular rhythm.  Pulmonary/Chest: Effort normal and breath sounds normal. No respiratory distress. She has no wheezes. She has no rales.  Abdominal: Soft. Bowel sounds are normal. She exhibits no distension and no mass. There is tenderness in the epigastric area. There is no rebound and no guarding.  Mild epigastric tenderness  Lymphadenopathy:    She has no cervical adenopathy.  Neurological: She is alert. No cranial nerve deficit.  Skin: Skin is warm and dry. No rash noted. No pallor.  Psychiatric: She has a normal mood and affect.  Pleasant           Assessment & Plan:   Problem List Items Addressed This Visit      Respiratory   Acute sinusitis - Primary    Cover with amoxicillin  Disc symptomatic care - see instructions on AVS  flonase daily Update if not starting to improve in a week or if worsening        Relevant Medications   amoxicillin (AMOXIL) 500 MG capsule   fluticasone (FLONASE) 50 MCG/ACT nasal spray     Other   Heartburn    Worse with daily use of ibuprofen Will d/c that for now Disc diet  Trial of pepcid 20 mg bid  Update if not starting to improve in a week or if worsening

## 2018-01-30 NOTE — Patient Instructions (Signed)
For sinus infection take amoxicillin as directed Drink fluids Use flonase  Nasal saline spray may also help over the counter  For acid reflux symptoms  Stop ibuprofen for at least 2 weeks Take pepcid 20 mg twice daily   Update if not starting to improve in a week or if worsening    Use diflucan for yeast infection if you get one

## 2018-02-01 NOTE — Assessment & Plan Note (Signed)
Worse with daily use of ibuprofen Will d/c that for now Disc diet  Trial of pepcid 20 mg bid  Update if not starting to improve in a week or if worsening

## 2018-02-01 NOTE — Assessment & Plan Note (Addendum)
Cover with amoxicillin  Disc symptomatic care - see instructions on AVS  flonase daily Update if not starting to improve in a week or if worsening

## 2018-03-02 ENCOUNTER — Ambulatory Visit: Payer: 59 | Admitting: Family Medicine

## 2018-03-02 ENCOUNTER — Telehealth: Payer: Self-pay

## 2018-03-02 ENCOUNTER — Encounter: Payer: Self-pay | Admitting: Family Medicine

## 2018-03-02 VITALS — BP 148/90 | HR 83 | Temp 98.4°F | Ht 66.0 in | Wt 172.0 lb

## 2018-03-02 DIAGNOSIS — H6593 Unspecified nonsuppurative otitis media, bilateral: Secondary | ICD-10-CM | POA: Diagnosis not present

## 2018-03-02 DIAGNOSIS — E559 Vitamin D deficiency, unspecified: Secondary | ICD-10-CM

## 2018-03-02 DIAGNOSIS — R42 Dizziness and giddiness: Secondary | ICD-10-CM

## 2018-03-02 DIAGNOSIS — E538 Deficiency of other specified B group vitamins: Secondary | ICD-10-CM | POA: Diagnosis not present

## 2018-03-02 LAB — CBC
HCT: 40.5 % (ref 36.0–46.0)
HEMOGLOBIN: 13.2 g/dL (ref 12.0–15.0)
MCHC: 32.5 g/dL (ref 30.0–36.0)
MCV: 86.2 fl (ref 78.0–100.0)
PLATELETS: 277 10*3/uL (ref 150.0–400.0)
RBC: 4.7 Mil/uL (ref 3.87–5.11)
RDW: 14.8 % (ref 11.5–15.5)
WBC: 3.8 10*3/uL — AB (ref 4.0–10.5)

## 2018-03-02 LAB — BASIC METABOLIC PANEL
BUN: 15 mg/dL (ref 6–23)
CHLORIDE: 103 meq/L (ref 96–112)
CO2: 28 meq/L (ref 19–32)
CREATININE: 0.69 mg/dL (ref 0.40–1.20)
Calcium: 10.1 mg/dL (ref 8.4–10.5)
GFR: 114.45 mL/min (ref >60.00)
Glucose, Bld: 96 mg/dL (ref 70–99)
Potassium: 4.4 mEq/L (ref 3.5–5.1)
Sodium: 140 mEq/L (ref 135–145)

## 2018-03-02 LAB — B12 AND FOLATE PANEL
Folate: 17.8 ng/mL (ref >5.9)
Vitamin B-12: 117 pg/mL — ABNORMAL LOW (ref 211–911)

## 2018-03-02 LAB — VITAMIN D 25 HYDROXY (VIT D DEFICIENCY, FRACTURES): VITD: 19.74 ng/mL — ABNORMAL LOW (ref 30.00–100.00)

## 2018-03-02 MED ORDER — MECLIZINE HCL 12.5 MG PO TABS: 12.5000 mg | ORAL_TABLET | Freq: Three times a day (TID) | ORAL | 0 refills | Status: DC | PRN

## 2018-03-02 MED ORDER — LORATADINE 10 MG PO TABS: 10.0000 mg | ORAL_TABLET | Freq: Every day | ORAL | 2 refills | Status: DC

## 2018-03-02 NOTE — Telephone Encounter (Signed)
Pt said on 02/27/18 pt woke up with dizziness and after awhile went away. Pt woke up again this morning with dizziness and nausea, if moves dizzy but if sitting still no problem. Pt said for one wk on and off blurred vision. Pt never had symptoms before. No H/A, CP or SOB. No available appts at [facility] and pt scheduled appt with Philis Nettle FNP at [facility]. Pt advised not to drive and pt will have her son drive her to appt. FYI to Philis Nettle FNP.

## 2018-03-02 NOTE — Progress Notes (Signed)
Subjective:    Patient ID: Ashley [name], female    DOB: 10-15-1965, 53 y.o.   MRN: [id number]  HPI  Patient presents to clinic with complaint of dizziness especially with position changes.  Patient states is been happening off and on for the past couple of weeks, but seemed worse today.  When patient goes from standing down to sitting or from sitting to standing she especially notices the room spinning and will have to stay in 1 position to get self steadied before she can move.  Denies any nausea or vomiting.  Denies fainting.  Denies any chest pain/palpitations.  Denies shortness of breath or wheezing.  Denies fever or chills or any recent illnesses.   Patient Active Problem List   Diagnosis Date Noted  . Heartburn 01/30/2018  . Acute sinusitis 01/30/2018  . Hyperlipidemia 03/19/2017  . Elevated blood pressure reading 03/19/2017  . Allergic rhinitis 03/19/2017  . Ovarian cyst, left 02/23/2016  . Pelvic pain 02/07/2016   Social History   Tobacco Use  . Smoking status: Never Smoker  . Smokeless tobacco: Never Used  Substance Use Topics  . Alcohol use: No   Review of Systems  Constitutional: Negative for chills, fatigue and fever.  HENT: +ear fullness. No ear pain, sinus pain or sore throat.   Eyes: Negative.   Respiratory: Negative for cough, shortness of breath and wheezing.   Cardiovascular: Negative for chest pain, palpitations and leg swelling.  Gastrointestinal: Negative for abdominal pain, diarrhea, nausea and vomiting.  Genitourinary: Negative for dysuria, frequency and urgency.  Musculoskeletal: Negative for arthralgias and myalgias.  Skin: Negative for color change, pallor and rash.  Neurological: Negative for syncope, and headaches. +dizziness.  Psychiatric/Behavioral: The patient is not nervous/anxious.       Objective:   Physical Exam  Constitutional: She appears well-developed and well-nourished. No distress.  HENT:  Head: Normocephalic and  atraumatic. Ears: Fullness bilateral TMs, small middle ear effusion. Eyes: Pupils are equal, round, and reactive to light. EOM are normal. No scleral icterus.  Neck: Normal range of motion. Neck supple. No tracheal deviation present.  Cardiovascular: Normal rate, regular rhythm and normal heart sounds.  Pulmonary/Chest: Effort normal and breath sounds normal. No respiratory distress. She has no wheezes. She has no rales.  Abdominal: Soft. Bowel sounds are normal. There is no tenderness.  Neurological: She is alert and oriented to person, place, and time.  Gait normal, speech clear.  Smile symmetrical, able to raise eyebrows/puff out cheeks/clenched teeth without issue.  Grips equal and strong.  Dorsi plantar flexion equal and strong.  No swaying with walking.  No arm drift. Skin: Skin is warm and dry. No pallor.  Psychiatric: She has a normal mood and affect. Her behavior is normal. Thought content normal.   Nursing note and vitals reviewed.  BP Readings from Last 3 Encounters:  03/02/18 (!) 148/90  01/30/18 (!) 146/82  10/29/17 124/87   Vitals:   03/02/18 1024  BP: (!) 148/90  Pulse: 83  Temp: 98.4 F (36.9 C)  SpO2: 98%     Assessment & Plan:   Dizziness/mid ear effusions- suspect dizziness is related to a benign positional vertigo due to happening mainly with position changes.  We will get lab work today to further investigate and rule out any lab abnormalities that could be contributing to dizziness.  Patient will get CBC, CMP, thyroid panel, vitamin D and B12/folate panel.  He will also begin taking once daily loratadine, I am hoping this nondrowsy  antihistamine will help dry up fullness in ears.  She will use meclizine as needed for any breakthrough dizziness.  Discussed that if dizziness persists and lab work is unremarkable for any abnormality the next step would be getting an MRI of brain.  Patient verbalizes understanding and states she would be agreeable to brain MRI if  symptoms persist and medications and lab work does not help to explain its cause.  Advised to follow-up with PCP as soon as possible for recheck.  Also advised she can return to our clinic or her PCP clinic anytime if issues arise.

## 2018-03-03 LAB — THYROID PANEL WITH TSH
FREE THYROXINE INDEX: 1.8 (ref 1.4–3.8)
T3 UPTAKE: 24 % (ref 22–35)
T4 TOTAL: 7.7 ug/dL (ref 5.1–11.9)
TSH: 2.47 m[IU]/L

## 2018-03-04 MED ORDER — CYANOCOBALAMIN 500 MCG PO TABS: 500.0000 ug | ORAL_TABLET | Freq: Every day | ORAL | 2 refills | Status: DC

## 2018-03-04 MED ORDER — VITAMIN D (ERGOCALCIFEROL) 1.25 MG (50000 UNIT) PO CAPS: 50000.0000 [IU] | ORAL_CAPSULE | ORAL | 2 refills | Status: DC

## 2018-03-04 NOTE — Addendum Note (Signed)
Addended by: Philis Nettle on: 03/04/2018 02:27 PM   Modules accepted: Orders

## 2018-03-09 ENCOUNTER — Telehealth: Payer: Self-pay | Admitting: Primary Care

## 2018-03-09 NOTE — Telephone Encounter (Signed)
Left message on voicemail for pt to return call to office for results.    

## 2018-03-09 NOTE — Telephone Encounter (Signed)
Copied from [facility] [id number]. Topic: Quick Communication - Lab Results (Clinic Use ONLY) >> Mar 09, 2018 10:01 AM Neta Ehlers, RMA wrote: Called patient to inform them of 03/04/2018 lab results. When patient returns call, triage nurse may disclose results.

## 2018-04-14 ENCOUNTER — Ambulatory Visit (INDEPENDENT_AMBULATORY_CARE_PROVIDER_SITE_OTHER): Payer: 59 | Admitting: Obstetrics & Gynecology

## 2018-04-14 ENCOUNTER — Encounter: Payer: Self-pay | Admitting: Obstetrics & Gynecology

## 2018-04-14 VITALS — BP 146/86 | HR 69 | Ht 66.0 in | Wt 173.0 lb

## 2018-04-14 DIAGNOSIS — N898 Other specified noninflammatory disorders of vagina: Secondary | ICD-10-CM

## 2018-04-14 DIAGNOSIS — Z113 Encounter for screening for infections with a predominantly sexual mode of transmission: Secondary | ICD-10-CM

## 2018-04-14 DIAGNOSIS — B373 Candidiasis of vulva and vagina: Secondary | ICD-10-CM

## 2018-04-14 DIAGNOSIS — Z01419 Encounter for gynecological examination (general) (routine) without abnormal findings: Secondary | ICD-10-CM

## 2018-04-14 DIAGNOSIS — Z1231 Encounter for screening mammogram for malignant neoplasm of breast: Secondary | ICD-10-CM

## 2018-04-14 MED ORDER — FLUCONAZOLE 150 MG PO TABS: 150.0000 mg | ORAL_TABLET | Freq: Once | ORAL | 3 refills | Status: AC

## 2018-04-14 NOTE — Patient Instructions (Signed)
Preventive Care 40-64 Years, Female Preventive care refers to lifestyle choices and visits with your health care provider that can promote health and wellness. What does preventive care include?   A yearly physical exam. This is also called an annual well check.  Dental exams once or twice a year.  Routine eye exams. Ask your health care provider how often you should have your eyes checked.  Personal lifestyle choices, including: ? Daily care of your teeth and gums. ? Regular physical activity. ? Eating a healthy diet. ? Avoiding tobacco and drug use. ? Limiting alcohol use. ? Practicing safe sex. ? Taking low-dose aspirin daily starting at age 50. ? Taking vitamin and mineral supplements as recommended by your health care provider. What happens during an annual well check? The services and screenings done by your health care provider during your annual well check will depend on your age, overall health, lifestyle risk factors, and family history of disease. Counseling Your health care provider may ask you questions about your:  Alcohol use.  Tobacco use.  Drug use.  Emotional well-being.  Home and relationship well-being.  Sexual activity.  Eating habits.  Work and work environment.  Method of birth control.  Menstrual cycle.  Pregnancy history. Screening You may have the following tests or measurements:  Height, weight, and BMI.  Blood pressure.  Lipid and cholesterol levels. These may be checked every 5 years, or more frequently if you are over 50 years old.  Skin check.  Lung cancer screening. You may have this screening every year starting at age 55 if you have a 30-pack-year history of smoking and currently smoke or have quit within the past 15 years.  Colorectal cancer screening. All adults should have this screening starting at age 50 and continuing until age 75. Your health care provider may recommend screening at age 45. You will have tests every  1-10 years, depending on your results and the type of screening test. People at increased risk should start screening at an earlier age. Screening tests may include: ? Guaiac-based fecal occult blood testing. ? Fecal immunochemical test (FIT). ? Stool DNA test. ? Virtual colonoscopy. ? Sigmoidoscopy. During this test, a flexible tube with a tiny camera (sigmoidoscope) is used to examine your rectum and lower colon. The sigmoidoscope is inserted through your anus into your rectum and lower colon. ? Colonoscopy. During this test, a long, thin, flexible tube with a tiny camera (colonoscope) is used to examine your entire colon and rectum.  Hepatitis C blood test.  Hepatitis B blood test.  Sexually transmitted disease (STD) testing.  Diabetes screening. This is done by checking your blood sugar (glucose) after you have not eaten for a while (fasting). You may have this done every 1-3 years.  Mammogram. This may be done every 1-2 years. Talk to your health care provider about when you should start having regular mammograms. This may depend on whether you have a family history of breast cancer.  BRCA-related cancer screening. This may be done if you have a family history of breast, ovarian, tubal, or peritoneal cancers.  Pelvic exam and Pap test. This may be done every 3 years starting at age 21. Starting at age 30, this may be done every 5 years if you have a Pap test in combination with an HPV test.  Bone density scan. This is done to screen for osteoporosis. You may have this scan if you are at high risk for osteoporosis. Discuss your test results, treatment options,   and if necessary, the need for more tests with your health care provider. Vaccines Your health care provider may recommend certain vaccines, such as:  Influenza vaccine. This is recommended every year.  Tetanus, diphtheria, and acellular pertussis (Tdap, Td) vaccine. You may need a Td booster every 10 years.  Varicella  vaccine. You may need this if you have not been vaccinated.  Zoster vaccine. You may need this after age 38.  Measles, mumps, and rubella (MMR) vaccine. You may need at least one dose of MMR if you were born in 1957 or later. You may also need a second dose.  Pneumococcal 13-valent conjugate (PCV13) vaccine. You may need this if you have certain conditions and were not previously vaccinated.  Pneumococcal polysaccharide (PPSV23) vaccine. You may need one or two doses if you smoke cigarettes or if you have certain conditions.  Meningococcal vaccine. You may need this if you have certain conditions.  Hepatitis A vaccine. You may need this if you have certain conditions or if you travel or work in places where you may be exposed to hepatitis A.  Hepatitis B vaccine. You may need this if you have certain conditions or if you travel or work in places where you may be exposed to hepatitis B.  Haemophilus influenzae type b (Hib) vaccine. You may need this if you have certain conditions. Talk to your health care provider about which screenings and vaccines you need and how often you need them. This information is not intended to replace advice given to you by your health care provider. Make sure you discuss any questions you have with your health care provider. Document Released: 03/10/2015 Document Revised: 04/03/2017 Document Reviewed: 12/13/2014 Elsevier Interactive Patient Education  2019 [employer].

## 2018-04-14 NOTE — Progress Notes (Signed)
GYNECOLOGY ANNUAL PREVENTATIVE CARE ENCOUNTER NOTE  Subjective:   Ashley [name] is a 53 y.o. [phone] female here for a routine annual gynecologic exam.  Current complaints:abnormal and irritating vaginal discharge for a few weeks.  She is s/p total hysterectomy in 2006 for benign fibroids.  Denies abnormal vaginal bleeding, discharge, pelvic pain, problems with intercourse or other gynecologic concerns.    Gynecologic History No LMP recorded. Patient has had a hysterectomy. Contraception: status post hysterectomy Last mammogram: 02/17/2017. Results were: normal  Obstetric History OB History  Gravida Para Term Preterm AB Living  5 3 3  0 2 3  SAB TAB Ectopic Multiple Live Births  2 0 0 0 3    # Outcome Date GA Lbr Len/2nd Weight Sex Delivery Anes PTL Lv  5 Term      Vag-Spont   LIV  4 Term      CS-Unspec   LIV  3 Term      CS-Unspec   LIV  2 SAB      SAB     1 SAB      SAB       Past Medical History:  Diagnosis Date  . Bronchitis    2 years ago   . [place] arthritis 2019   base of left thumb   . GERD (gastroesophageal reflux disease)    diet controlled , no meds  . Hydrosalpinx    bilateral   . Hyperlipidemia    recent dx - currently diet control - no meds  . Hypothyroid 2003   radio active iodine - no meds  . SVD (spontaneous vaginal delivery)    x 1    Past Surgical History:  Procedure Laterality Date  . CESAREAN SECTION     x 2  . CHOLECYSTECTOMY  2008  . DILATION AND CURETTAGE OF UTERUS     X 2  . LAPAROSCOPIC BILATERAL SALPINGECTOMY Bilateral 07/29/2017   Procedure: LAPAROSCOPIC LEFT SALPINGOOPHRECTOMY: Partial right Salpingectomy;  Surgeon: Lavonia Drafts, MD;  Location: [facility];  Service: Gynecology;  Laterality: Bilateral;  . SHOULDER SURGERY  2016   arthroscopic  . TOTAL ABDOMINAL HYSTERECTOMY  2006   Fibroids, benign pathology; partial hysterectomy per patient   . TUBAL LIGATION    . WISDOM TOOTH EXTRACTION     at age  56 yr  . WRIST ARTHROSCOPY Right 2007    Current Outpatient Medications on File Prior to Visit  Medication Sig Dispense Refill  . fluticasone (FLONASE) 50 MCG/ACT nasal spray Place 2 sprays into both nostrils daily. 16 g 2  . vitamin B-12 (CYANOCOBALAMIN) 500 MCG tablet Take 1 tablet (500 mcg total) by mouth daily. 30 tablet 2  . Vitamin D, Ergocalciferol, (DRISDOL) 1.25 MG (50000 UT) CAPS capsule Take 1 capsule (50,000 Units total) by mouth every 7 (seven) days. 4 capsule 2  . famotidine (PEPCID) 20 MG tablet Take 1 tablet (20 mg total) by mouth 2 (two) times daily. (Patient not taking: Reported on 04/14/2018) 60 tablet 3  . loratadine (CLARITIN) 10 MG tablet Take 1 tablet (10 mg total) by mouth daily. (Patient not taking: Reported on 04/14/2018) 30 tablet 2  . meclizine (ANTIVERT) 12.5 MG tablet Take 1 tablet (12.5 mg total) by mouth 3 (three) times daily as needed for dizziness. Can cause drowsiness. (Patient not taking: Reported on 04/14/2018) 30 tablet 0   No current facility-administered medications on file prior to visit.     No Known Allergies  Social History:  reports that she has never smoked. She has never used smokeless tobacco. She reports that she does not drink alcohol or use drugs.  Family History  Problem Relation Age of Onset  . Hypertension Mother   . Breast cancer Maternal Aunt   . Hypertension Maternal Grandmother   . Heart failure Maternal Grandmother   . Lung cancer Maternal Grandmother   . Colon cancer Neg Hx   . Colon polyps Neg Hx   . Rectal cancer Neg Hx   . Stomach cancer Neg Hx     The following portions of the patient's history were reviewed and updated as appropriate: allergies, current medications, past family history, past medical history, past social history, past surgical history and problem list.  Review of Systems Pertinent items noted in HPI and remainder of comprehensive ROS otherwise negative.   Objective:  BP (!) 146/86   Pulse 69   Ht 5'  6" (1.676 m)   Wt 173 lb (78.5 kg)   BMI 27.92 kg/m  CONSTITUTIONAL: Well-developed, well-nourished female in no acute distress.  HENT:  Normocephalic, atraumatic, External right and left ear normal. Oropharynx is clear and moist EYES: Conjunctivae and EOM are normal. Pupils are equal, round, and reactive to light. No scleral icterus.  NECK: Normal range of motion, supple, no masses.  Normal thyroid.  SKIN: Skin is warm and dry. No rash noted. Not diaphoretic. No erythema. No pallor. MUSCULOSKELETAL: Normal range of motion. No tenderness.  No cyanosis, clubbing, or edema.  2+ distal pulses. NEUROLOGIC: Alert and oriented to person, place, and time. Normal reflexes, muscle tone coordination. No cranial nerve deficit noted. PSYCHIATRIC: Normal mood and affect. Normal behavior. Normal judgment and thought content. CARDIOVASCULAR: Normal heart rate noted, regular rhythm RESPIRATORY: Clear to auscultation bilaterally. Effort and breath sounds normal, no problems with respiration noted. BREASTS: Symmetric in size. No masses, skin changes, nipple drainage, or lymphadenopathy. ABDOMEN: Soft, normal bowel sounds, no distention noted.  No tenderness, rebound or guarding.  PELVIC: Normal appearing external genitalia; normal appearing vaginal mucosa and well-healed cuff. White clumpy discharge noted. , sample obtained. Unable to palpate right adnexa, no tenderness elicited on exam.    Assessment and Plan:  1. Vaginal discharge Will presumptively treat for yeast vaginitis, follow up results.  - Cervicovaginal ancillary only - fluconazole (DIFLUCAN) 150 MG tablet; Take 1 tablet (150 mg total) by mouth once for 1 dose. Can take additional dose three days later if symptoms persist  Dispense: 1 tablet; Refill: 3  2. Breast cancer screening by mammogram Mammogram scheduled - MM DIGITAL SCREENING BILATERAL; Future  3. Well woman exam with routine gynecological exam She is s/p hysterectomy for benign  indications, no pap screening needed. Normal gynecologic exam. Routine preventative health maintenance measures emphasized. Please refer to After Visit Summary for other counseling recommendations.    Verita Schneiders, MD, [place] for [employer], [place]

## 2018-04-15 LAB — CERVICOVAGINAL ANCILLARY ONLY
Bacterial vaginitis: NEGATIVE
CANDIDA VAGINITIS: POSITIVE — AB
Chlamydia: NEGATIVE
NEISSERIA GONORRHEA: NEGATIVE
TRICH (WINDOWPATH): NEGATIVE

## 2018-04-16 ENCOUNTER — Ambulatory Visit: Payer: 59 | Admitting: Obstetrics and Gynecology

## 2018-05-07 ENCOUNTER — Ambulatory Visit: Payer: 59

## 2018-06-04 ENCOUNTER — Ambulatory Visit: Payer: 59

## 2018-06-16 ENCOUNTER — Other Ambulatory Visit: Payer: Self-pay

## 2018-06-16 ENCOUNTER — Encounter: Payer: Self-pay | Admitting: Primary Care

## 2018-06-16 ENCOUNTER — Ambulatory Visit (INDEPENDENT_AMBULATORY_CARE_PROVIDER_SITE_OTHER): Payer: 59 | Admitting: Primary Care

## 2018-06-16 DIAGNOSIS — R12 Heartburn: Secondary | ICD-10-CM | POA: Diagnosis not present

## 2018-06-16 DIAGNOSIS — R030 Elevated blood-pressure reading, without diagnosis of hypertension: Secondary | ICD-10-CM

## 2018-06-16 DIAGNOSIS — J309 Allergic rhinitis, unspecified: Secondary | ICD-10-CM | POA: Diagnosis not present

## 2018-06-16 MED ORDER — CETIRIZINE HCL 10 MG PO TABS: 10.0000 mg | ORAL_TABLET | Freq: Every day | ORAL | 0 refills | Status: DC

## 2018-06-16 MED ORDER — OMEPRAZOLE 20 MG PO CPDR: 20.0000 mg | DELAYED_RELEASE_CAPSULE | Freq: Every day | ORAL | 0 refills | Status: DC

## 2018-06-16 NOTE — Patient Instructions (Signed)
Start Zyrtec 10 mg daily for allergies.  Start omeprazole 20 mg daily for heartburn.  Stop Pepcid 20 mg twice daily.  Please update me in 1-2 weeks. Continue this regimen for at least 3-4 weeks.  It was a pleasure to see you today! Allie Bossier, NP-C

## 2018-06-16 NOTE — Progress Notes (Addendum)
Subjective:    Patient ID: Ashley [name], female    DOB: 1965-03-14, 53 y.o.   MRN: [id number]  HPI  Virtual Visit via Video Note  I connected with Ashley [name] on 06/16/18 at  3:20 PM EDT by a video enabled telemedicine application and verified that I am speaking with the correct person using two identifiers.   I discussed the limitations of evaluation and management by telemedicine and the availability of in person appointments. The patient expressed understanding and agreed to proceed. She is at work, I am in the office.  Part way through our visit the video failed so we completed our visit via phone. I was able to examine her via video initially. Phone visit lasted 15 minutes in duration.  History of Present Illness:  Ashley [name] is a 53 year old female with a history of sinusitis, allergic rhinitis, GERD who presents today with a chief complaint of post nasal drip and GERD.  1) GERD/Postnasal Drip: Symptoms include belching, chest fullness, esophageal burning, post nasal drip, cough, rhinorrhea. Chronic history of GERD which has been overall well managed on OTC treatment. Over the last two weeks her symptoms have progressed. She was evaluated by Dr. Glori Bickers in December 2019 for same symptoms and was prescribed Pepcid 20 mg BID. She did notice improvement on Pepcid BID overall except for over the past two weeks and is compliant since prescribed.  She denies sneezing, watery eyes. She is not taking Claritin or another antihistamine. She is using her Flonase daily with improvement. She is sleeping well.   2) Elevated Blood Pressure: No prior history of hypertension. She does not check her BP cuff at home. She denies dizziness, chest pain, shortness of breath. She's had elevated readings during several prior visits.   BP Readings from Last 3 Encounters:  04/14/18 (!) 146/86  03/02/18 (!) 148/90  01/30/18 (!) 146/82      Observations/Objective:  Appears well. Alert and  oriented. No cough during visit.  Assessment and Plan:  See problem based charting.  Follow Up Instructions:  Start Zyrtec 10 mg daily for allergies.  Start omeprazole 20 mg daily for heartburn.  Stop Pepcid 20 mg twice daily.  Please update me in 1-2 weeks. Continue this regimen for at least 3-4 weeks.  It was a pleasure to see you today! Allie Bossier, NP-C    I discussed the assessment and treatment plan with the patient. The patient was provided an opportunity to ask questions and all were answered. The patient agreed with the plan and demonstrated an understanding of the instructions.   The patient was advised to call back or seek an in-person evaluation if the symptoms worsen or if the condition fails to improve as anticipated.     Pleas Koch, NP    Review of Systems  Constitutional: Negative for chills and fever.  HENT: Positive for postnasal drip and rhinorrhea. Negative for sneezing and sore throat.   Eyes: Negative for visual disturbance.  Respiratory: Positive for cough. Negative for shortness of breath.   Cardiovascular: Negative for chest pain.  Gastrointestinal:       GERD symptoms   Allergic/Immunologic: Positive for environmental allergies.  Neurological: Negative for dizziness and headaches.       Past Medical History:  Diagnosis Date  . Bronchitis    2 years ago   . [place] arthritis 2019   base of left thumb   . GERD (gastroesophageal reflux disease)    diet controlled ,  no meds  . Hydrosalpinx    bilateral   . Hyperlipidemia    recent dx - currently diet control - no meds  . Hypothyroid 2003   radio active iodine - no meds  . SVD (spontaneous vaginal delivery)    x 1     Social History   Socioeconomic History  . Marital status: Single    Spouse name: Not on file  . Number of children: Not on file  . Years of education: Not on file  . Highest education level: Not on file  Occupational History  . Not on file  Social Needs  .  Financial resource strain: Not on file  . Food insecurity:    Worry: Not on file    Inability: Not on file  . Transportation needs:    Medical: Not on file    Non-medical: Not on file  Tobacco Use  . Smoking status: Never Smoker  . Smokeless tobacco: Never Used  Substance and Sexual Activity  . Alcohol use: No  . Drug use: No  . Sexual activity: Yes    Partners: Male    Birth control/protection: Surgical    Comment: hysterectomy.  Lifestyle  . Physical activity:    Days per week: Not on file    Minutes per session: Not on file  . Stress: Not on file  Relationships  . Social connections:    Talks on phone: Not on file    Gets together: Not on file    Attends religious service: Not on file    Active member of club or organization: Not on file    Attends meetings of clubs or organizations: Not on file    Relationship status: Not on file  . Intimate partner violence:    Fear of current or ex partner: Not on file    Emotionally abused: Not on file    Physically abused: Not on file    Forced sexual activity: Not on file  Other Topics Concern  . Not on file  Social History Narrative   Single.   3 children.   Works at [employer] as a Designer, multimedia.   Enjoys relaxing, exercise, walking her dog.     Past Surgical History:  Procedure Laterality Date  . CESAREAN SECTION     x 2  . CHOLECYSTECTOMY  2008  . DILATION AND CURETTAGE OF UTERUS     X 2  . LAPAROSCOPIC BILATERAL SALPINGECTOMY Bilateral 07/29/2017   Procedure: LAPAROSCOPIC LEFT SALPINGOOPHRECTOMY: Partial right Salpingectomy;  Surgeon: Lavonia Drafts, MD;  Location: [facility];  Service: Gynecology;  Laterality: Bilateral;  . SHOULDER SURGERY  2016   arthroscopic  . TOTAL ABDOMINAL HYSTERECTOMY  2006   Fibroids, benign pathology; partial hysterectomy per patient   . TUBAL LIGATION    . WISDOM TOOTH EXTRACTION     at age 44 yr  . WRIST ARTHROSCOPY Right 2007    Family History  Problem Relation Age  of Onset  . Hypertension Mother   . Breast cancer Maternal Aunt   . Hypertension Maternal Grandmother   . Heart failure Maternal Grandmother   . Lung cancer Maternal Grandmother   . Colon cancer Neg Hx   . Colon polyps Neg Hx   . Rectal cancer Neg Hx   . Stomach cancer Neg Hx     No Known Allergies  Current Outpatient Medications on File Prior to Visit  Medication Sig Dispense Refill  . famotidine (PEPCID) 20 MG tablet Take 1 tablet (  20 mg total) by mouth 2 (two) times daily. 60 tablet 3  . fluticasone (FLONASE) 50 MCG/ACT nasal spray Place 2 sprays into both nostrils daily. 16 g 2  . vitamin B-12 (CYANOCOBALAMIN) 500 MCG tablet Take 1 tablet (500 mcg total) by mouth daily. 30 tablet 2  . Vitamin D, Ergocalciferol, (DRISDOL) 1.25 MG (50000 UT) CAPS capsule Take 1 capsule (50,000 Units total) by mouth every 7 (seven) days. 4 capsule 2  . loratadine (CLARITIN) 10 MG tablet Take 1 tablet (10 mg total) by mouth daily. (Patient not taking: Reported on 04/14/2018) 30 tablet 2  . meclizine (ANTIVERT) 12.5 MG tablet Take 1 tablet (12.5 mg total) by mouth 3 (three) times daily as needed for dizziness. Can cause drowsiness. (Patient not taking: Reported on 04/14/2018) 30 tablet 0   No current facility-administered medications on file prior to visit.     There were no vitals taken for this visit.   Objective:   Physical Exam         Assessment & Plan:

## 2018-06-16 NOTE — Assessment & Plan Note (Signed)
Allergy symptoms likely causing aggravation of GERD. Treat with Zyrtec 10 mg nightly and omeprazole 20 mg daily. Stop famotidine for now.  She will update in 1-2 weeks.

## 2018-06-16 NOTE — Assessment & Plan Note (Signed)
Noted on numerous visits. Discussed to start monitoring home BP readings and report readings at or above 135/90. She verbalized understanding.

## 2018-06-23 ENCOUNTER — Encounter: Payer: Self-pay | Admitting: Primary Care

## 2018-06-23 ENCOUNTER — Ambulatory Visit (INDEPENDENT_AMBULATORY_CARE_PROVIDER_SITE_OTHER): Payer: 59 | Admitting: Primary Care

## 2018-06-23 DIAGNOSIS — R109 Unspecified abdominal pain: Secondary | ICD-10-CM

## 2018-06-23 DIAGNOSIS — R030 Elevated blood-pressure reading, without diagnosis of hypertension: Secondary | ICD-10-CM | POA: Diagnosis not present

## 2018-06-23 DIAGNOSIS — R1084 Generalized abdominal pain: Secondary | ICD-10-CM | POA: Insufficient documentation

## 2018-06-23 DIAGNOSIS — R1011 Right upper quadrant pain: Secondary | ICD-10-CM | POA: Diagnosis not present

## 2018-06-23 DIAGNOSIS — K219 Gastro-esophageal reflux disease without esophagitis: Secondary | ICD-10-CM

## 2018-06-23 DIAGNOSIS — E559 Vitamin D deficiency, unspecified: Secondary | ICD-10-CM

## 2018-06-23 DIAGNOSIS — E538 Deficiency of other specified B group vitamins: Secondary | ICD-10-CM | POA: Diagnosis not present

## 2018-06-23 HISTORY — DX: Unspecified abdominal pain: R10.9

## 2018-06-23 NOTE — Assessment & Plan Note (Signed)
Compliant to oral vitamin B12, repeat vitamin B12 lab pending.

## 2018-06-23 NOTE — Assessment & Plan Note (Signed)
Recent BP stable, but borderline. She will continue to monitor and update.

## 2018-06-23 NOTE — Assessment & Plan Note (Signed)
Unclear etiology. Differentials include colon involvement, MSK, ductal involvement. Cholecystectomy in 2008. Check labs to start.

## 2018-06-23 NOTE — Assessment & Plan Note (Signed)
Compliant to vitamin D OTC. Repeat vitamin D pending.

## 2018-06-23 NOTE — Progress Notes (Signed)
Subjective:    Patient ID: Ashley [name], female    DOB: [birth year], 53 y.o.   MRN: [id number]  HPI  Virtual Visit via Video Note  I connected with Ashley [name] on 06/23/18 at  4:00 PM EDT by a video enabled telemedicine application and verified that I am speaking with the correct person using two identifiers.   I discussed the limitations of evaluation and management by telemedicine and the availability of in person appointments. The patient expressed understanding and agreed to proceed. She is at home, I am in the office.  We attempted a video visit but the connection failed numerous times. We had to conduct this visit via phone. Phone conversation lasted 11 min and 5 sec.  History of Present Illness:  Ashley [name] is a 53 year old female with a history of GERD who presents today with a chief complaint of throat burning and abdominal pain.  She was last evaluated one week ago for allergy symptoms and esophageal reflux. We determined that her allergy symptoms were likely aggravating her GERD so we had her start Zyrtec 10 mg and omeprazole 20 mg and to hold famotidine.  Since her last visit her symptoms of reflux are about the same. Her symptoms are most bothersome in the morning when she's waking. Symptoms include throat fullness and burning in the morning, also feels like something is "stuck in the middle of my chest". She has no problems during the night. She was taking the omeprazole at night but recently switched to day time as she didn't find any improvement by taking it at night. She is still taking Zyrtec and has noticed improvement in her allergy symptoms.  She's also noticed intermittent RUQ abdominal pain that began about one month ago. Pain is sporadic and isn't related to meals, she describes it as a dull pain. She had a cholecystectomy in 2008. She denies constipation and diarrhea, nausea/vomiting, bloody stools. She was evaluated in January 2020 at [facility] for dizziness and was found to have low vitamin D and B12. She has since been taking both vitamins.    Observations/Objective:  Alert and oriented. No distress. Speaking in complete sentences.  Assessment and Plan:  See problem based charting.  Follow Up Instructions:  Schedule a lab only appointment for tomorrow as discussed.  Increase your omeprazole to 40 mg at bedtime, continue the Zyrtec daily.  I will be in touch with your results soon.  It was a pleasure to see you today! Allie Bossier, NP-C    I discussed the assessment and treatment plan with the patient. The patient was provided an opportunity to ask questions and all were answered. The patient agreed with the plan and demonstrated an understanding of the instructions.   The patient was advised to call back or seek an in-person evaluation if the symptoms worsen or if the condition fails to improve as anticipated.     Pleas Koch, NP    Review of Systems  HENT: Negative for congestion and postnasal drip.   Respiratory: Negative for cough and shortness of breath.   Gastrointestinal: Positive for abdominal pain. Negative for blood in stool, constipation, diarrhea, nausea and vomiting.       Esophageal/throat burning  Allergic/Immunologic: Positive for environmental allergies.       Past Medical History:  Diagnosis Date   Bronchitis    2 years ago    [facility] arthritis 2019   base of left thumb    GERD (  gastroesophageal reflux disease)    diet controlled , no meds   Hydrosalpinx    bilateral    Hyperlipidemia    recent dx - currently diet control - no meds   Hypothyroid 2003   radio active iodine - no meds   SVD (spontaneous vaginal delivery)    x 1     Social History   Socioeconomic History   Marital status: Single    Spouse name: Not on file   Number of children: Not on file   Years of education: Not on file   Highest education level: Not on file  Occupational History    Not on file  Social Needs   Financial resource strain: Not on file   Food insecurity:    Worry: Not on file    Inability: Not on file   Transportation needs:    Medical: Not on file    Non-medical: Not on file  Tobacco Use   Smoking status: Never Smoker   Smokeless tobacco: Never Used  Substance and Sexual Activity   Alcohol use: No   Drug use: No   Sexual activity: Yes    Partners: Male    Birth control/protection: Surgical    Comment: hysterectomy.  Lifestyle   Physical activity:    Days per week: Not on file    Minutes per session: Not on file   Stress: Not on file  Relationships   Social connections:    Talks on phone: Not on file    Gets together: Not on file    Attends religious service: Not on file    Active member of club or organization: Not on file    Attends meetings of clubs or organizations: Not on file    Relationship status: Not on file   Intimate partner violence:    Fear of current or ex partner: Not on file    Emotionally abused: Not on file    Physically abused: Not on file    Forced sexual activity: Not on file  Other Topics Concern   Not on file  Social History Narrative   Single.   3 children.   Works at [employer] as a Designer, multimedia.   Enjoys relaxing, exercise, walking her dog.     Past Surgical History:  Procedure Laterality Date   CESAREAN SECTION     x 2   CHOLECYSTECTOMY  2008   DILATION AND CURETTAGE OF UTERUS     X 2   LAPAROSCOPIC BILATERAL SALPINGECTOMY Bilateral 07/29/2017   Procedure: LAPAROSCOPIC LEFT SALPINGOOPHRECTOMY: Partial right Salpingectomy;  Surgeon: Lavonia Drafts, MD;  Location: [facility];  Service: Gynecology;  Laterality: Bilateral;   SHOULDER SURGERY  2016   arthroscopic   TOTAL ABDOMINAL HYSTERECTOMY  2006   Fibroids, benign pathology; partial hysterectomy per patient    TUBAL LIGATION     WISDOM TOOTH EXTRACTION     at age 74 yr   WRIST ARTHROSCOPY Right 2007    Family  History  Problem Relation Age of Onset   Hypertension Mother    Breast cancer Maternal Aunt    Hypertension Maternal Grandmother    Heart failure Maternal Grandmother    Lung cancer Maternal Grandmother    Colon cancer Neg Hx    Colon polyps Neg Hx    Rectal cancer Neg Hx    Stomach cancer Neg Hx     No Known Allergies  Current Outpatient Medications on File Prior to Visit  Medication Sig Dispense Refill  cetirizine (ZYRTEC) 10 MG tablet Take 1 tablet (10 mg total) by mouth daily. For allergies. 90 tablet 0   famotidine (PEPCID) 20 MG tablet Take 1 tablet (20 mg total) by mouth 2 (two) times daily. 60 tablet 3   fluticasone (FLONASE) 50 MCG/ACT nasal spray Place 2 sprays into both nostrils daily. 16 g 2   omeprazole (PRILOSEC) 20 MG capsule Take 1 capsule (20 mg total) by mouth daily. For heartburn. 90 capsule 0   vitamin B-12 (CYANOCOBALAMIN) 500 MCG tablet Take 1 tablet (500 mcg total) by mouth daily. 30 tablet 2   Vitamin D, Ergocalciferol, (DRISDOL) 1.25 MG (50000 UT) CAPS capsule Take 1 capsule (50,000 Units total) by mouth every 7 (seven) days. 4 capsule 2   No current facility-administered medications on file prior to visit.     BP 135/82    Pulse 84    Objective:   Physical Exam  Constitutional: She is oriented to person, place, and time.  Respiratory: Effort normal. No respiratory distress.  Neurological: She is alert and oriented to person, place, and time.  Psychiatric: She has a normal mood and affect.           Assessment & Plan:

## 2018-06-23 NOTE — Assessment & Plan Note (Signed)
Symptoms still present. Increase omeprazole to 40 mg daily. Continue Zyrtec. She will update.

## 2018-06-23 NOTE — Patient Instructions (Signed)
Schedule a lab only appointment for tomorrow as discussed.  Increase your omeprazole to 40 mg at bedtime, continue the Zyrtec daily.  I will be in touch with your results soon.  It was a pleasure to see you today! Allie Bossier, NP-C

## 2018-06-25 ENCOUNTER — Telehealth: Payer: Self-pay | Admitting: Primary Care

## 2018-06-25 ENCOUNTER — Telehealth: Payer: Self-pay

## 2018-06-25 DIAGNOSIS — M2550 Pain in unspecified joint: Secondary | ICD-10-CM

## 2018-06-25 NOTE — Telephone Encounter (Signed)
Left detailed VM w COVID screen and curbside info   

## 2018-06-25 NOTE — Telephone Encounter (Signed)
Noted, labs added

## 2018-06-25 NOTE — Telephone Encounter (Signed)
Best number [phone] Pt called she has labs tomorrow and wanted to know if you could test rheumaotid arthrotis  She is having joint pain

## 2018-06-26 ENCOUNTER — Other Ambulatory Visit (INDEPENDENT_AMBULATORY_CARE_PROVIDER_SITE_OTHER): Payer: 59

## 2018-06-26 ENCOUNTER — Other Ambulatory Visit: Payer: Self-pay

## 2018-06-26 DIAGNOSIS — E559 Vitamin D deficiency, unspecified: Secondary | ICD-10-CM

## 2018-06-26 DIAGNOSIS — R1011 Right upper quadrant pain: Secondary | ICD-10-CM

## 2018-06-26 DIAGNOSIS — E538 Deficiency of other specified B group vitamins: Secondary | ICD-10-CM | POA: Diagnosis not present

## 2018-06-26 DIAGNOSIS — M2550 Pain in unspecified joint: Secondary | ICD-10-CM | POA: Diagnosis not present

## 2018-06-26 LAB — CBC WITH DIFFERENTIAL/PLATELET
Basophils Absolute: 0 10*3/uL (ref 0.0–0.1)
Basophils Relative: 0.6 % (ref 0.0–3.0)
Eosinophils Absolute: 0 10*3/uL (ref 0.0–0.7)
Eosinophils Relative: 1.3 % (ref 0.0–5.0)
HCT: 40.2 % (ref 36.0–46.0)
Hemoglobin: 13.3 g/dL (ref 12.0–15.0)
Lymphocytes Relative: 47.9 % — ABNORMAL HIGH (ref 12.0–46.0)
Lymphs Abs: 1.6 10*3/uL (ref 0.7–4.0)
MCHC: 33 g/dL (ref 30.0–36.0)
MCV: 86.4 fl (ref 78.0–100.0)
Monocytes Absolute: 0.2 10*3/uL (ref 0.1–1.0)
Monocytes Relative: 7.5 % (ref 3.0–12.0)
Neutro Abs: 1.4 10*3/uL (ref 1.4–7.7)
Neutrophils Relative %: 42.7 % — ABNORMAL LOW (ref 43.0–77.0)
Platelets: 236 10*3/uL (ref 150.0–400.0)
RBC: 4.66 Mil/uL (ref 3.87–5.11)
RDW: 14.7 % (ref 11.5–15.5)
WBC: 3.3 10*3/uL — ABNORMAL LOW (ref 4.0–10.5)

## 2018-06-26 LAB — HEPATIC FUNCTION PANEL
ALT: 10 U/L (ref 0–35)
AST: 20 U/L (ref 0–37)
Albumin: 4.1 g/dL (ref 3.5–5.2)
Alkaline Phosphatase: 90 U/L (ref 39–117)
Bilirubin, Direct: 0 mg/dL (ref 0.0–0.3)
Total Bilirubin: 0.3 mg/dL (ref 0.2–1.2)
Total Protein: 7.2 g/dL (ref 6.0–8.3)

## 2018-06-26 LAB — LIPASE: Lipase: 9 U/L — ABNORMAL LOW (ref 11.0–59.0)

## 2018-06-26 LAB — VITAMIN B12: Vitamin B-12: 340 pg/mL (ref 211–911)

## 2018-06-26 LAB — SEDIMENTATION RATE: Sed Rate: 21 mm/hr (ref 0–30)

## 2018-06-26 LAB — C-REACTIVE PROTEIN: CRP: <1 mg/dL (ref 0.5–20.0)

## 2018-06-26 LAB — VITAMIN D 25 HYDROXY (VIT D DEFICIENCY, FRACTURES): VITD: 51.97 ng/mL (ref 30.00–100.00)

## 2018-06-26 NOTE — Addendum Note (Signed)
Addended by: Ellamae Sia on: 06/26/2018 10:20 AM   Modules accepted: Orders

## 2018-06-29 LAB — CYCLIC CITRUL PEPTIDE ANTIBODY, IGG: Cyclic Citrullin Peptide Ab: <16 UNITS

## 2018-06-29 LAB — RHEUMATOID FACTOR: Rheumatoid fact SerPl-aCnc: <14 IU/mL (ref <14)

## 2018-06-30 ENCOUNTER — Ambulatory Visit (INDEPENDENT_AMBULATORY_CARE_PROVIDER_SITE_OTHER): Payer: 59 | Admitting: Primary Care

## 2018-06-30 ENCOUNTER — Encounter: Payer: Self-pay | Admitting: Primary Care

## 2018-06-30 ENCOUNTER — Telehealth: Payer: Self-pay

## 2018-06-30 DIAGNOSIS — M1990 Unspecified osteoarthritis, unspecified site: Secondary | ICD-10-CM | POA: Insufficient documentation

## 2018-06-30 DIAGNOSIS — M159 Polyosteoarthritis, unspecified: Secondary | ICD-10-CM

## 2018-06-30 NOTE — Telephone Encounter (Signed)
[facility] Patient Name: Ashley [name] Gender: Female DOB: March 09, 1965 Age: 53 Y 64 M 6 D Return Phone Number: [phone] (Primary) Address: City/State/Zip: [place] [address] Client [facility] Night - Client Client Site [place] Physician Alma Friendly - NP Contact Type Call Who Is Calling Patient / Member / Family / Caregiver Call Type Triage / Clinical Relationship To Patient Self Return Phone Number [phone] (Primary) Chief Complaint Joint Pain Reason for Call Symptomatic / Request for Health Information Initial Comment Caller was tested negative for rheumatoid arthritis. Hands are hurting and feet and knees are starting to hurt also. Translation No Nurse Assessment Nurse: Turner-Bodine, RN, Freda Munro Date/Time (Eastern Time): 06/29/2018 5:33:50 PM Confirm and document reason for call. If symptomatic, describe symptoms. ---right hand is hurting x 1 week and worse last three days, swelling in right hand, pain in right foot and swollen under right foot Has the patient had close contact with a person known or suspected to have the novel coronavirus illness OR traveled / lives in area with major community spread (including international travel) in the last 14 days from the onset of symptoms? * If Asymptomatic, screen for exposure and travel within the last 14 days. ---No Does the patient have any new or worsening symptoms? ---Yes Will a triage be completed? ---Yes Related visit to physician within the last 2 weeks? ---No Does the PT have any chronic conditions? (i.e. diabetes, asthma, this includes High risk factors for pregnancy, etc.) ---Yes List chronic conditions. ---acid reflux Is the patient pregnant or possibly pregnant? (Ask all females between the ages of 27-55) ---No Is this a behavioral health or substance  abuse call? ---No Guidelines Guideline Title Affirmed Question Affirmed Notes Nurse Date/Time (Eastern Time) Hand and Wrist Pain [1] MODERATE pain (e.g., interferes with normal activities) AND [2] present > 3 days Turner-Bodine, RN, Freda Munro 06/29/2018 5:37:16 PM PLEASE NOTE: All timestamps contained within this report are represented as [place] Standard Time. CONFIDENTIALTY NOTICE: This fax transmission is intended only for the addressee. It contains information that is legally privileged, confidential or otherwise protected from use or disclosure. If you are not the intended recipient, you are strictly prohibited from reviewing, disclosing, copying using or disseminating any of this information or taking any action in reliance on or regarding this information. If you have received this fax in error, please notify us immediately by telephone so that we can arrange for its return to [place]. Phone: [phone], Toll-Free: [phone], Fax: [phone] Page: 2 of 2 Call Id: [id number] [place]. Time Eilene Ghazi Time) Disposition Final User 06/29/2018 5:45:07 PM SEE PCP WITHIN 3 DAYS Yes Turner-Bodine, RN, Ayesha Mohair Disagree/Comply Comply Caller Understands Yes PreDisposition Call Doctor Care Advice Given Per Guideline SEE PCP WITHIN 3 DAYS: * You need to be seen within 2 or 3 days. Call your doctor (or NP/PA) during regular office hours and make an appointment. A clinic or urgent care center are good places to go for care if your doctor's office is closed or you can't get an appointment. NOTE: If office will be open tomorrow, tell caller to call then, not in 3 days. PAIN MEDICINES: * For pain relief, take acetaminophen, ibuprofen, or naproxen. * Use the lowest amount that makes your pain feel better. ACETAMINOPHEN (E.G., TYLENOL): * Take 650 mg (two 325 mg pills) by mouth every 4-6 hours as needed. Each Regular Strength Tylenol pill has  325 mg of acetaminophen. The most you should take each day is  3,250 mg (10 Regular Strength pills a day). IBUPROFEN (E.G., MOTRIN, ADVIL): * Take 400 mg (two 200 mg pills) by mouth every 6 hours as needed. CALL BACK IF: * Signs of infection occur (e.g., spreading redness, warmth, fever) * You become worse. CARE ADVICE given per Hand and Wrist Pain (Adult) guideline. Comments User: Terrall Laity, RN Date/Time Eilene Ghazi Time): 06/29/2018 5:45:51 PM caller wants a virtual visit scheduled with the office to follow up with her hand and feet pain Referrals REFERRED TO PCP OFFICE

## 2018-06-30 NOTE — Telephone Encounter (Signed)
Pt has virtual appt 06/30/18 at 11:20.

## 2018-06-30 NOTE — Progress Notes (Signed)
Subjective:    Patient ID: Ashley [name], female    DOB: 1965/08/04, 53 y.o.   MRN: [id number]  HPI  Virtual Visit via Video Note  I connected with Ashley [name] on 06/30/18 at 11:20 AM EDT by a video enabled telemedicine application and verified that I am speaking with the correct person using two identifiers.  Location: Patient: Home Provider: Office   I discussed the limitations of evaluation and management by telemedicine and the availability of in person appointments. The patient expressed understanding and agreed to proceed.  History of Present Illness:  Ashley [name] is a 53 year old female who presents today with a chief complaint of arthralgia.    Her pain is located to the balls of her feet, bilateral knees, hand hands. Her pain has been present for about 2 weeks. Several nights ago she woke up and noticed pain to the MCP joints of the right hand, had pain when making a fist. She denies swelling and erythema. Her knee pain is not bothersome when moving or walking, only when sitting for prolonged periods of time.  She was recently tested for rheumatoid arthritis and inflammatory disease and tested negative. She's started taking Tylenol Arthritis and took one at bedtime last night. She is not exercising much.   Observations/Objective:  Alert and oriented. Appears well, not sickly. No distress. Speaking in complete sentences.  No joint swelling or erythema to hands.  Assessment and Plan:  See problem based charting.  Follow Up Instructions:  Continue with Tylenol Arthritis. You can take 1 tablet three times daily as needed.  You can also try adding in Aleve 1-2 times daily if needed.  Please update me if your symptoms do not improve.  It was a pleasure to see you today! Allie Bossier, NP-C    I discussed the assessment and treatment plan with the patient. The patient was provided an opportunity to ask questions and all were answered. The patient agreed  with the plan and demonstrated an understanding of the instructions.   The patient was advised to call back or seek an in-person evaluation if the symptoms worsen or if the condition fails to improve as anticipated.     Pleas Koch, NP    Review of Systems  Musculoskeletal: Positive for arthralgias and back pain.  Skin: Negative for color change.  Neurological: Negative for weakness and numbness.       Past Medical History:  Diagnosis Date  . Bronchitis    2 years ago   . [facility] arthritis 2019   base of left thumb   . GERD (gastroesophageal reflux disease)    diet controlled , no meds  . Hydrosalpinx    bilateral   . Hyperlipidemia    recent dx - currently diet control - no meds  . Hypothyroid 2003   radio active iodine - no meds  . SVD (spontaneous vaginal delivery)    x 1     Social History   Socioeconomic History  . Marital status: Single    Spouse name: Not on file  . Number of children: Not on file  . Years of education: Not on file  . Highest education level: Not on file  Occupational History  . Not on file  Social Needs  . Financial resource strain: Not on file  . Food insecurity:    Worry: Not on file    Inability: Not on file  . Transportation needs:    Medical: Not on file  Non-medical: Not on file  Tobacco Use  . Smoking status: Never Smoker  . Smokeless tobacco: Never Used  Substance and Sexual Activity  . Alcohol use: No  . Drug use: No  . Sexual activity: Yes    Partners: Male    Birth control/protection: Surgical    Comment: hysterectomy.  Lifestyle  . Physical activity:    Days per week: Not on file    Minutes per session: Not on file  . Stress: Not on file  Relationships  . Social connections:    Talks on phone: Not on file    Gets together: Not on file    Attends religious service: Not on file    Active member of club or organization: Not on file    Attends meetings of clubs or organizations: Not on file     Relationship status: Not on file  . Intimate partner violence:    Fear of current or ex partner: Not on file    Emotionally abused: Not on file    Physically abused: Not on file    Forced sexual activity: Not on file  Other Topics Concern  . Not on file  Social History Narrative   Single.   3 children.   Works at [employer] as a Designer, multimedia.   Enjoys relaxing, exercise, walking her dog.     Past Surgical History:  Procedure Laterality Date  . CESAREAN SECTION     x 2  . CHOLECYSTECTOMY  2008  . DILATION AND CURETTAGE OF UTERUS     X 2  . LAPAROSCOPIC BILATERAL SALPINGECTOMY Bilateral 07/29/2017   Procedure: LAPAROSCOPIC LEFT SALPINGOOPHRECTOMY: Partial right Salpingectomy;  Surgeon: Lavonia Drafts, MD;  Location: [facility];  Service: Gynecology;  Laterality: Bilateral;  . SHOULDER SURGERY  2016   arthroscopic  . TOTAL ABDOMINAL HYSTERECTOMY  2006   Fibroids, benign pathology; partial hysterectomy per patient   . TUBAL LIGATION    . WISDOM TOOTH EXTRACTION     at age 14 yr  . WRIST ARTHROSCOPY Right 2007    Family History  Problem Relation Age of Onset  . Hypertension Mother   . Breast cancer Maternal Aunt   . Hypertension Maternal Grandmother   . Heart failure Maternal Grandmother   . Lung cancer Maternal Grandmother   . Colon cancer Neg Hx   . Colon polyps Neg Hx   . Rectal cancer Neg Hx   . Stomach cancer Neg Hx     No Known Allergies  Current Outpatient Medications on File Prior to Visit  Medication Sig Dispense Refill  . cetirizine (ZYRTEC) 10 MG tablet Take 1 tablet (10 mg total) by mouth daily. For allergies. 90 tablet 0  . famotidine (PEPCID) 20 MG tablet Take 1 tablet (20 mg total) by mouth 2 (two) times daily. 60 tablet 3  . fluticasone (FLONASE) 50 MCG/ACT nasal spray Place 2 sprays into both nostrils daily. 16 g 2  . omeprazole (PRILOSEC) 20 MG capsule Take 1 capsule (20 mg total) by mouth daily. For heartburn. 90 capsule 0  . vitamin B-12  (CYANOCOBALAMIN) 500 MCG tablet Take 1 tablet (500 mcg total) by mouth daily. 30 tablet 2  . Vitamin D, Ergocalciferol, (DRISDOL) 1.25 MG (50000 UT) CAPS capsule Take 1 capsule (50,000 Units total) by mouth every 7 (seven) days. 4 capsule 2   No current facility-administered medications on file prior to visit.     There were no vitals taken for this visit.   Objective:  Physical Exam  Constitutional: She is oriented to person, place, and time. She appears well-nourished.  Respiratory: Effort normal. No respiratory distress.  Musculoskeletal: Normal range of motion.     Right hand: She exhibits normal range of motion and no swelling.  Neurological: She is alert and oriented to person, place, and time.  Skin: Skin is dry. No erythema.           Assessment & Plan:

## 2018-06-30 NOTE — Telephone Encounter (Signed)
Noted, will evaluate. 

## 2018-06-30 NOTE — Assessment & Plan Note (Signed)
Symptoms suggestive of osteoarthritis.  She did test negative for RA.  Recommended she continue with Tylenol Arthritis TID PRN, can add in Aleve if needed. Also recommended regular exercise.  She will update.

## 2018-06-30 NOTE — Patient Instructions (Signed)
Continue with Tylenol Arthritis. You can take 1 tablet three times daily as needed.  You can also try adding in Aleve 1-2 times daily if needed.  Please update me if your symptoms do not improve.  It was a pleasure to see you today! Allie Bossier, NP-C

## 2018-07-07 ENCOUNTER — Telehealth: Payer: Self-pay | Admitting: *Deleted

## 2018-07-07 MED ORDER — METRONIDAZOLE 500 MG PO TABS: 500.0000 mg | ORAL_TABLET | Freq: Two times a day (BID) | ORAL | 0 refills | Status: DC

## 2018-07-07 NOTE — Telephone Encounter (Signed)
Pt called stating she is has an odor and some discharge and believes to have BV again and sates she does get this often. Informed pt we will send in RX for her and also discussed with patient about not using scented soaps or detergents, wearing cotton underwear. Pt verbalizes and understands.

## 2018-07-17 ENCOUNTER — Other Ambulatory Visit: Payer: Self-pay

## 2018-07-17 ENCOUNTER — Ambulatory Visit
Admission: RE | Admit: 2018-07-17 | Discharge: 2018-07-17 | Disposition: A | Discharge status: Home / Self Care | Payer: 59 | Source: Ambulatory Visit | Attending: Obstetrics & Gynecology | Admitting: Obstetrics & Gynecology

## 2018-07-17 DIAGNOSIS — Z1231 Encounter for screening mammogram for malignant neoplasm of breast: Secondary | ICD-10-CM

## 2018-07-21 ENCOUNTER — Other Ambulatory Visit: Payer: Self-pay | Admitting: Obstetrics & Gynecology

## 2018-07-21 DIAGNOSIS — R928 Other abnormal and inconclusive findings on diagnostic imaging of breast: Secondary | ICD-10-CM

## 2018-07-27 ENCOUNTER — Ambulatory Visit
Admission: RE | Admit: 2018-07-27 | Discharge: 2018-07-27 | Disposition: A | Discharge status: Home / Self Care | Payer: 59 | Source: Ambulatory Visit | Attending: Obstetrics & Gynecology | Admitting: Obstetrics & Gynecology

## 2018-07-27 ENCOUNTER — Ambulatory Visit: Payer: 59

## 2018-07-27 ENCOUNTER — Other Ambulatory Visit: Payer: Self-pay

## 2018-07-27 DIAGNOSIS — R928 Other abnormal and inconclusive findings on diagnostic imaging of breast: Secondary | ICD-10-CM

## 2018-10-02 ENCOUNTER — Other Ambulatory Visit: Payer: Self-pay

## 2018-10-02 ENCOUNTER — Encounter: Payer: Self-pay | Admitting: Family Medicine

## 2018-10-02 ENCOUNTER — Ambulatory Visit (INDEPENDENT_AMBULATORY_CARE_PROVIDER_SITE_OTHER): Payer: Managed Care, Other (non HMO) | Admitting: Family Medicine

## 2018-10-02 VITALS — BP 140/84 | HR 96 | Temp 98.5°F | Ht 66.0 in | Wt 169.4 lb

## 2018-10-02 DIAGNOSIS — R109 Unspecified abdominal pain: Secondary | ICD-10-CM

## 2018-10-02 DIAGNOSIS — M549 Dorsalgia, unspecified: Secondary | ICD-10-CM | POA: Diagnosis not present

## 2018-10-02 LAB — POC URINALSYSI DIPSTICK (AUTOMATED)
Bilirubin, UA: NEGATIVE
Blood, UA: NEGATIVE
Glucose, UA: NEGATIVE
Ketones, UA: NEGATIVE
Leukocytes, UA: NEGATIVE
Nitrite, UA: NEGATIVE
Protein, UA: NEGATIVE
Spec Grav, UA: 1.015 (ref 1.010–1.025)
Urobilinogen, UA: 0.2 E.U./dL
pH, UA: 7 (ref 5.0–8.0)

## 2018-10-02 NOTE — Patient Instructions (Signed)
Good to see you today  Try to modify your work space to minimize reaching  Try ibuprofen 2-3 tablets every 8 to 12 hours as needed, also try heat  If no improvement in a week, please be in touch

## 2018-10-02 NOTE — Progress Notes (Signed)
Subjective:    Patient ID: Ashley [name], female    DOB: Dec 21, 1965, 53 y.o.   MRN: [id number]  HPI This is a 53 year old female, patient of Danny Lawless, who presents today for acute visit for 2-week history of right-sided abdominal pain.  She reports that the pain starts under her right rib and radiates down to her pelvic region and into her back.  She has not had any nausea vomiting or fever.  No change in her bowel movements.  She has had some decreased appetite with this.  The pain is constant and dull with occasional sharpness.  Sometimes the pain is improved with position change.  She has had her gallbladder removed previously.  She has had a total hysterectomy with bilateral salpingectomy. Pain comes and goes, not at night when she goes to bed. Notices more in the morning. Some on left side but not as frequent or severe. 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 She stands at her job and stretches forward and to the sides. She notices that she doesn't have as many symptoms on her days off. Took tylenol last night with some relief.   Continued GERD symptoms. Has been taking omeprazole twice a day on an empty stomach with some improvement. Also treating allergies.   Past Medical History:  Diagnosis Date  . Bronchitis    2 years ago   . [place] arthritis 2019   base of left thumb   . GERD (gastroesophageal reflux disease)    diet controlled , no meds  . Hydrosalpinx    bilateral   . Hyperlipidemia    recent dx - currently diet control - no meds  . Hypothyroid 2003   radio active iodine - no meds  . SVD (spontaneous vaginal delivery)    x 1   Past Surgical History:  Procedure Laterality Date  . CESAREAN SECTION     x 2  . CHOLECYSTECTOMY  2008  . DILATION AND CURETTAGE OF UTERUS     X 2  . LAPAROSCOPIC BILATERAL SALPINGECTOMY Bilateral 07/29/2017   Procedure: LAPAROSCOPIC LEFT SALPINGOOPHRECTOMY: Partial right Salpingectomy;  Surgeon: Lavonia Drafts, MD;   Location: [place] [facility];  Service: Gynecology;  Laterality: Bilateral;  . SHOULDER SURGERY  2016   arthroscopic  . TOTAL ABDOMINAL HYSTERECTOMY  2006   Fibroids, benign pathology; partial hysterectomy per patient   . TUBAL LIGATION    . WISDOM TOOTH EXTRACTION     at age 74 yr  . WRIST ARTHROSCOPY Right 2007   Family History  Problem Relation Age of Onset  . Hypertension Mother   . Breast cancer Maternal Aunt   . Hypertension Maternal Grandmother   . Heart failure Maternal Grandmother   . Lung cancer Maternal Grandmother   . Colon cancer Neg Hx   . Colon polyps Neg Hx   . Rectal cancer Neg Hx   . Stomach cancer Neg Hx    Social History   Tobacco Use  . Smoking status: Never Smoker  . Smokeless tobacco: Never Used  Substance Use Topics  . Alcohol use: No  . Drug use: No      Review of Systems Per HPI    Objective:   Physical Exam Vitals signs reviewed.  Constitutional:      General: She is not in acute distress.    Appearance: She is well-developed and normal weight. She is not ill-appearing, toxic-appearing or diaphoretic.  HENT:     Head: Normocephalic and atraumatic.  Cardiovascular:     Rate and Rhythm: Normal rate and regular rhythm.     Heart sounds: Normal heart sounds.  Pulmonary:     Effort: Pulmonary effort is normal.     Breath sounds: Normal breath sounds.  Abdominal:     General: Abdomen is flat. Bowel sounds are normal. There is no distension.     Palpations: Abdomen is soft. There is no mass.     Tenderness: There is abdominal tenderness (RUQ). There is no right CVA tenderness, left CVA tenderness, guarding or rebound.     Hernia: No hernia is present.  Musculoskeletal:     Thoracic back: She exhibits normal range of motion and no bony tenderness.     Lumbar back: She exhibits normal range of motion and no bony tenderness.     Comments: Normal gait. Back with normal ROM. Tender over right lateral ribs and pain with twisting to  left.   Skin:    General: Skin is warm and dry.  Neurological:     Mental Status: She is alert.       BP 140/84 (BP Location: Right Arm, Patient Position: Sitting, Cuff Size: Normal)   Pulse 96   Temp 98.5 F (36.9 C) (Oral)   Ht 5\' 6"  (1.676 m)   Wt 169 lb 6.4 oz (76.8 kg)   SpO2 98%   BMI 27.34 kg/m  Wt Readings from Last 3 Encounters:  10/02/18 169 lb 6.4 oz (76.8 kg)  04/14/18 173 lb (78.5 kg)  03/02/18 172 lb (78 kg)   Results for orders placed or performed in visit on 10/02/18  POCT Urinalysis Dipstick (Automated)  Result Value Ref Range   Color, UA gold    Clarity, UA cloudy    Glucose, UA Negative Negative   Bilirubin, UA neg    Ketones, UA neg    Spec Grav, UA 1.015 1.010 - 1.025   Blood, UA neg    pH, UA 7.0 5.0 - 8.0   Protein, UA Negative Negative   Urobilinogen, UA 0.2 0.2 or 1.0 E.U./dL   Nitrite, UA neg    Leukocytes, UA Negative Negative       Assessment & Plan:  1. Right flank pain - normal urine - POCT Urinalysis Dipstick (Automated)  2. Musculoskeletal back pain - discussed diagnosis with patient, highly suspicious for muscle strain due to repetitive stretching and twisting motions at work -  Patient Instructions  Good to see you today  Try to modify your work space to minimize reaching  Try ibuprofen 2-3 tablets every 8 to 12 hours as needed, also try heat  If no improvement in a week, please be in touch    Clarene Reamer, FNP-BC  [facility], [place]  10/04/2018 1:16 PM

## 2018-10-04 ENCOUNTER — Encounter: Payer: Self-pay | Admitting: Family Medicine

## 2018-10-06 ENCOUNTER — Ambulatory Visit (INDEPENDENT_AMBULATORY_CARE_PROVIDER_SITE_OTHER): Payer: Managed Care, Other (non HMO) | Admitting: Primary Care

## 2018-10-06 ENCOUNTER — Other Ambulatory Visit: Payer: Self-pay

## 2018-10-06 DIAGNOSIS — K219 Gastro-esophageal reflux disease without esophagitis: Secondary | ICD-10-CM

## 2018-10-06 MED ORDER — PANTOPRAZOLE SODIUM 20 MG PO TBEC: DELAYED_RELEASE_TABLET | ORAL | 0 refills | Status: DC

## 2018-10-06 NOTE — Patient Instructions (Signed)
Stop omeprazole.  Start pantoprazole 20 mg once daily for now. Increase to twice daily after a few days if no improvement.  Please update me in 3-4 weeks as discussed.  It was a pleasure to see you today!

## 2018-10-06 NOTE — Assessment & Plan Note (Signed)
  Symptoms are representative of uncontrolled GERD, especially since she's noticed relief with Tums and Pepcid.  Given little improvement with omeprazole 40 mg, will switch to pantoprazole. Rx for pantoprazole 20 mg sent to pharmacy, discussed to start with once daily, then increase to BID if needed. Consider GI evaluation if symptoms persist.

## 2018-10-06 NOTE — Progress Notes (Signed)
Subjective:    Patient ID: Ashley [name], female    DOB: 1965/06/21, 53 y.o.   MRN: [id number]  HPI  Virtual Visit via Video Note  I connected with Sherrica Niehaus on 10/06/18 at  2:40 PM EDT by a video enabled telemedicine application and verified that I am speaking with the correct person using two identifiers.  Location: Patient: Home Provider: Office   I discussed the limitations of evaluation and management by telemedicine and the availability of in person appointments. The patient expressed understanding and agreed to proceed.  History of Present Illness:  Ashley [name] is a 53 year old female with a history of allergic rhinitis, GERD, hyperlipidemia who presents today with a chief complaint of esophageal reflux.  Symptoms include substernal fullness, feels as though "something is sitting in my chest", epigastric discomfort, belching. Symptoms are relieved with belching but will return. Symptoms have been present intermittently for months, but began consistently for the last 2-3 weeks.  She is currently managed on omeprazole 20 mg for which she's taken intermittently. She's been taking her omeprazole 20 mg once daily for the last 2-3 weeks and then 40 mg once daily for the last week without improvement. Historically she's noticed resolve in her GERD symptoms for weeks at a time but this is not the case recently. She's also taken Tums and baking soda along with her omeprazole with some improvement.   She has really watched her diet and avoids spicy/greasy/fatty foods.    Observations/Objective:  Alert and oriented. Appears well, not sickly. No distress. Speaking in complete sentences.   Assessment and Plan:  Symptoms are representative of uncontrolled GERD, especially since she's noticed relief with Tums and Pepcid.  Given little improvement with omeprazole 40 mg, will switch to pantoprazole. Rx for pantoprazole 20 mg sent to pharmacy, discussed to start  with once daily, then increase to BID if needed. Consider GI evaluation if symptoms persist.   Follow Up Instructions:  Stop omeprazole.  Start pantoprazole 20 mg once daily for now. Increase to twice daily after a few days if no improvement.  Please update me in 3-4 weeks as discussed.  It was a pleasure to see you today!    I discussed the assessment and treatment plan with the patient. The patient was provided an opportunity to ask questions and all were answered. The patient agreed with the plan and demonstrated an understanding of the instructions.   The patient was advised to call back or seek an in-person evaluation if the symptoms worsen or if the condition fails to improve as anticipated.     Pleas Koch, NP    Review of Systems  Constitutional: Negative for unexpected weight change.  Cardiovascular: Negative for chest pain.  Gastrointestinal:       Belching, esophageal pressure, epigastric discomfort       Past Medical History:  Diagnosis Date  . Bronchitis    2 years ago   . [place] arthritis 2019   base of left thumb   . GERD (gastroesophageal reflux disease)    diet controlled , no meds  . Hydrosalpinx    bilateral   . Hyperlipidemia    recent dx - currently diet control - no meds  . Hypothyroid 2003   radio active iodine - no meds  . SVD (spontaneous vaginal delivery)    x 1     Social History   Socioeconomic History  . Marital status: Single    Spouse name: Not on  file  . Number of children: Not on file  . Years of education: Not on file  . Highest education level: Not on file  Occupational History  . Not on file  Social Needs  . Financial resource strain: Not on file  . Food insecurity    Worry: Not on file    Inability: Not on file  . Transportation needs    Medical: Not on file    Non-medical: Not on file  Tobacco Use  . Smoking status: Never Smoker  . Smokeless tobacco: Never Used  Substance and Sexual Activity  . Alcohol  use: No  . Drug use: No  . Sexual activity: Yes    Partners: Male    Birth control/protection: Surgical    Comment: hysterectomy.  Lifestyle  . Physical activity    Days per week: Not on file    Minutes per session: Not on file  . Stress: Not on file  Relationships  . Social Herbalist on phone: Not on file    Gets together: Not on file    Attends religious service: Not on file    Active member of club or organization: Not on file    Attends meetings of clubs or organizations: Not on file    Relationship status: Not on file  . Intimate partner violence    Fear of current or ex partner: Not on file    Emotionally abused: Not on file    Physically abused: Not on file    Forced sexual activity: Not on file  Other Topics Concern  . Not on file  Social History Narrative   Single.   3 children.   Works at [employer] as a Designer, multimedia.   Enjoys relaxing, exercise, walking her dog.     Past Surgical History:  Procedure Laterality Date  . CESAREAN SECTION     x 2  . CHOLECYSTECTOMY  2008  . DILATION AND CURETTAGE OF UTERUS     X 2  . LAPAROSCOPIC BILATERAL SALPINGECTOMY Bilateral 07/29/2017   Procedure: LAPAROSCOPIC LEFT SALPINGOOPHRECTOMY: Partial right Salpingectomy;  Surgeon: Lavonia Drafts, MD;  Location: [place];  Service: Gynecology;  Laterality: Bilateral;  . SHOULDER SURGERY  2016   arthroscopic  . TOTAL ABDOMINAL HYSTERECTOMY  2006   Fibroids, benign pathology; partial hysterectomy per patient   . TUBAL LIGATION    . WISDOM TOOTH EXTRACTION     at age 86 yr  . WRIST ARTHROSCOPY Right 2007    Family History  Problem Relation Age of Onset  . Hypertension Mother   . Breast cancer Maternal Aunt   . Hypertension Maternal Grandmother   . Heart failure Maternal Grandmother   . Lung cancer Maternal Grandmother   . Colon cancer Neg Hx   . Colon polyps Neg Hx   . Rectal cancer Neg Hx   . Stomach cancer Neg Hx     No Known Allergies   Current Outpatient Medications on File Prior to Visit  Medication Sig Dispense Refill  . cetirizine (ZYRTEC) 10 MG tablet Take 1 tablet (10 mg total) by mouth daily. For allergies. 90 tablet 0  . fluticasone (FLONASE) 50 MCG/ACT nasal spray Place 2 sprays into both nostrils daily. 16 g 2  . omeprazole (PRILOSEC) 20 MG capsule Take 1 capsule (20 mg total) by mouth daily. For heartburn. (Patient taking differently: Take 40 mg by mouth daily. For heartburn.) 90 capsule 0  . vitamin B-12 (CYANOCOBALAMIN) 500 MCG tablet Take  1 tablet (500 mcg total) by mouth daily. 30 tablet 2  . fluconazole (DIFLUCAN) 150 MG tablet Take 150 mg by mouth 2 (two) times daily.      No current facility-administered medications on file prior to visit.     There were no vitals taken for this visit.   Objective:   Physical Exam  Constitutional: She is oriented to person, place, and time. She appears well-nourished. She does not have a sickly appearance. She does not appear ill.  Respiratory: Effort normal.  Neurological: She is alert and oriented to person, place, and time.  Psychiatric: She has a normal mood and affect.           Assessment & Plan:

## 2018-10-26 DIAGNOSIS — M6530 Trigger finger, unspecified finger: Secondary | ICD-10-CM | POA: Insufficient documentation

## 2018-11-13 DIAGNOSIS — M67442 Ganglion, left hand: Secondary | ICD-10-CM | POA: Insufficient documentation

## 2018-11-26 ENCOUNTER — Ambulatory Visit: Payer: Managed Care, Other (non HMO) | Admitting: Obstetrics and Gynecology

## 2018-11-30 ENCOUNTER — Encounter: Payer: Self-pay | Admitting: Primary Care

## 2018-11-30 ENCOUNTER — Ambulatory Visit (INDEPENDENT_AMBULATORY_CARE_PROVIDER_SITE_OTHER): Payer: Managed Care, Other (non HMO) | Admitting: Primary Care

## 2018-11-30 ENCOUNTER — Other Ambulatory Visit: Payer: Self-pay

## 2018-11-30 VITALS — BP 130/86 | HR 80 | Temp 97.8°F | Ht 66.0 in | Wt 163.2 lb

## 2018-11-30 DIAGNOSIS — R350 Frequency of micturition: Secondary | ICD-10-CM

## 2018-11-30 DIAGNOSIS — R1011 Right upper quadrant pain: Secondary | ICD-10-CM

## 2018-11-30 HISTORY — DX: Frequency of micturition: R35.0

## 2018-11-30 LAB — POC URINALSYSI DIPSTICK (AUTOMATED)
Bilirubin, UA: NEGATIVE
Blood, UA: NEGATIVE
Glucose, UA: NEGATIVE
Leukocytes, UA: NEGATIVE
Nitrite, UA: NEGATIVE
Protein, UA: NEGATIVE
Spec Grav, UA: >=1.03 — AB (ref 1.010–1.025)
Urobilinogen, UA: 0.2 E.U./dL
pH, UA: 5.5 (ref 5.0–8.0)

## 2018-11-30 NOTE — Assessment & Plan Note (Addendum)
Lower abdomen vs suprapubic x 2 weeks. UA overall unremarkable.  Abdominal exam today benign. No vaginal symptoms.  Constipation is a probably cause for discomfort. Recommend to increase water, fiber, improve diet. If symptoms begin to progress then consider labs, pelvic exam, and imaging. She will update.

## 2018-11-30 NOTE — Assessment & Plan Note (Signed)
Intermittent for the last two weeks, improving. UA today negative. No glucose, leuks, blood.  Feeling better with water intake and cranberry pills. Will have her monitor symptoms and report any changes.

## 2018-11-30 NOTE — Progress Notes (Signed)
Subjective:    Patient ID: Ashley [name], female    DOB: 03-Jul-1965, 53 y.o.   MRN: [id number]  HPI  Ashley [name] is a 53 year old female with a history of pelvic pain, GERD, ovarian cyst (left), abdominal pain who presents today with a chief complaint of suprapubic pain.  Her pain initially began around her waistline but is now mostlylocated to the right lower back and suprapubic region. Her pain is intermittent, mostly notices discomfort to the suprapubic region when urinating. Symptoms began about two weeks ago and are overall improved.   She has noticed constipation with firm bowel movements every 2-3 days over the last one week. Also reports increased urinary frequency. She denies bloody stools, dysuria, hematuria ,vaginal symptoms. She's been increasing water intake, and taking cranberry pills with some improvement.   Review of Systems  Constitutional: Negative for fatigue and fever.  Gastrointestinal: Positive for constipation. Negative for abdominal pain, blood in stool, nausea and vomiting.  Genitourinary: Positive for frequency. Negative for dysuria and vaginal discharge.       Past Medical History:  Diagnosis Date  . Bronchitis    2 years ago   . [place] arthritis 2019   base of left thumb   . GERD (gastroesophageal reflux disease)    diet controlled , no meds  . Hydrosalpinx    bilateral   . Hyperlipidemia    recent dx - currently diet control - no meds  . Hypothyroid 2003   radio active iodine - no meds  . SVD (spontaneous vaginal delivery)    x 1     Social History   Socioeconomic History  . Marital status: Single    Spouse name: Not on file  . Number of children: Not on file  . Years of education: Not on file  . Highest education level: Not on file  Occupational History  . Not on file  Social Needs  . Financial resource strain: Not on file  . Food insecurity    Worry: Not on file    Inability: Not on file  . Transportation needs    Medical:  Not on file    Non-medical: Not on file  Tobacco Use  . Smoking status: Never Smoker  . Smokeless tobacco: Never Used  Substance and Sexual Activity  . Alcohol use: No  . Drug use: No  . Sexual activity: Yes    Partners: Male    Birth control/protection: Surgical    Comment: hysterectomy.  Lifestyle  . Physical activity    Days per week: Not on file    Minutes per session: Not on file  . Stress: Not on file  Relationships  . Social Herbalist on phone: Not on file    Gets together: Not on file    Attends religious service: Not on file    Active member of club or organization: Not on file    Attends meetings of clubs or organizations: Not on file    Relationship status: Not on file  . Intimate partner violence    Fear of current or ex partner: Not on file    Emotionally abused: Not on file    Physically abused: Not on file    Forced sexual activity: Not on file  Other Topics Concern  . Not on file  Social History Narrative   Single.   3 children.   Works at [employer] as a Designer, multimedia.   Enjoys relaxing, exercise, walking her dog.  Past Surgical History:  Procedure Laterality Date  . CESAREAN SECTION     x 2  . CHOLECYSTECTOMY  2008  . DILATION AND CURETTAGE OF UTERUS     X 2  . LAPAROSCOPIC BILATERAL SALPINGECTOMY Bilateral 07/29/2017   Procedure: LAPAROSCOPIC LEFT SALPINGOOPHRECTOMY: Partial right Salpingectomy;  Surgeon: Lavonia Drafts, MD;  Location: [place];  Service: Gynecology;  Laterality: Bilateral;  . SHOULDER SURGERY  2016   arthroscopic  . TOTAL ABDOMINAL HYSTERECTOMY  2006   Fibroids, benign pathology; partial hysterectomy per patient   . TUBAL LIGATION    . WISDOM TOOTH EXTRACTION     at age 17 yr  . WRIST ARTHROSCOPY Right 2007    Family History  Problem Relation Age of Onset  . Hypertension Mother   . Breast cancer Maternal Aunt   . Hypertension Maternal Grandmother   . Heart failure Maternal Grandmother   .  Lung cancer Maternal Grandmother   . Colon cancer Neg Hx   . Colon polyps Neg Hx   . Rectal cancer Neg Hx   . Stomach cancer Neg Hx     No Known Allergies  Current Outpatient Medications on File Prior to Visit  Medication Sig Dispense Refill  . cetirizine (ZYRTEC) 10 MG tablet Take 1 tablet (10 mg total) by mouth daily. For allergies. 90 tablet 0  . fluticasone (FLONASE) 50 MCG/ACT nasal spray Place 2 sprays into both nostrils daily. 16 g 2  . pantoprazole (PROTONIX) 20 MG tablet Take 1 tablet by mouth once to twice daily for heartburn. 60 tablet 0   No current facility-administered medications on file prior to visit.     BP 130/86   Pulse 80   Temp 97.8 F (36.6 C) (Temporal)   Ht 5\' 6"  (1.676 m)   Wt 163 lb 4 oz (74 kg)   SpO2 98%   BMI 26.35 kg/m    Objective:   Physical Exam  Constitutional: She appears well-nourished.  Neck: Neck supple.  Cardiovascular: Normal rate and regular rhythm.  Respiratory: Effort normal and breath sounds normal.  GI: Soft. Bowel sounds are normal. There is no abdominal tenderness. There is no CVA tenderness.  Skin: Skin is warm and dry.  Psychiatric: She has a normal mood and affect.           Assessment & Plan:

## 2018-11-30 NOTE — Patient Instructions (Signed)
Ensure you are consuming 64 ounces of water daily.  Increase fiber in your diet to help with bowel movements.   Please notify me if you develop blood in the stools, blood in the urine, increased pain.  It was a pleasure to see you today!   High-Fiber Diet Fiber, also called dietary fiber, is a type of carbohydrate that is found in fruits, vegetables, whole grains, and beans. A high-fiber diet can have many health benefits. Your health care provider may recommend a high-fiber diet to help:  Prevent constipation. Fiber can make your bowel movements more regular.  Lower your cholesterol.  Relieve the following conditions: ? Swelling of veins in the anus (hemorrhoids). ? Swelling and irritation (inflammation) of specific areas of the digestive tract (uncomplicated diverticulosis). ? A problem of the large intestine (colon) that sometimes causes pain and diarrhea (irritable bowel syndrome, IBS).  Prevent overeating as part of a weight-loss plan.  Prevent heart disease, type 2 diabetes, and certain cancers. What is my plan? The recommended daily fiber intake in grams (g) includes:  38 g for men age 54 or younger.  30 g for men over age 54.  40 g for women age 54 or younger.  21 g for women over age 54. You can get the recommended daily intake of dietary fiber by:  Eating a variety of fruits, vegetables, grains, and beans.  Taking a fiber supplement, if it is not possible to get enough fiber through your diet. What do I need to know about a high-fiber diet?  It is better to get fiber through food sources rather than from fiber supplements. There is not a lot of research about how effective supplements are.  Always check the fiber content on the nutrition facts label of any prepackaged food. Look for foods that contain 5 g of fiber or more per serving.  Talk with a diet and nutrition specialist (dietitian) if you have questions about specific foods that are recommended or not  recommended for your medical condition, especially if those foods are not listed below.  Gradually increase how much fiber you consume. If you increase your intake of dietary fiber too quickly, you may have bloating, cramping, or gas.  Drink plenty of water. Water helps you to digest fiber. What are tips for following this plan?  Eat a wide variety of high-fiber foods.  Make sure that half of the grains that you eat each day are whole grains.  Eat breads and cereals that are made with whole-grain flour instead of refined flour or white flour.  Eat brown rice, bulgur wheat, or millet instead of white rice.  Start the day with a breakfast that is high in fiber, such as a cereal that contains 5 g of fiber or more per serving.  Use beans in place of meat in soups, salads, and pasta dishes.  Eat high-fiber snacks, such as berries, raw vegetables, nuts, and popcorn.  Choose whole fruits and vegetables instead of processed forms like juice or sauce. What foods can I eat?  Fruits Berries. Pears. Apples. Oranges. Avocado. Prunes and raisins. Dried figs. Vegetables Sweet potatoes. Spinach. Kale. Artichokes. Cabbage. Broccoli. Cauliflower. Green peas. Carrots. Squash. Grains Whole-grain breads. Multigrain cereal. Oats and oatmeal. Brown rice. Barley. Bulgur wheat. [place]. Quinoa. Bran muffins. Popcorn. Rye wafer crackers. Meats and other proteins Navy, kidney, and pinto beans. Soybeans. Split peas. Lentils. Nuts and seeds. Dairy Fiber-fortified yogurt. Beverages Fiber-fortified soy milk. Fiber-fortified orange juice. Other foods Fiber bars. The items listed  above may not be a complete list of recommended foods and beverages. Contact a dietitian for more options. What foods are not recommended? Fruits Fruit juice. Cooked, strained fruit. Vegetables Fried potatoes. Canned vegetables. Well-cooked vegetables. Grains White bread. Pasta made with refined flour. White rice. Meats and  other proteins Fatty cuts of meat. Fried chicken or fried fish. Dairy Milk. Yogurt. Cream cheese. Sour cream. Fats and oils Butters. Beverages Soft drinks. Other foods Cakes and pastries. The items listed above may not be a complete list of foods and beverages to avoid. Contact a dietitian for more information. Summary  Fiber is a type of carbohydrate. It is found in fruits, vegetables, whole grains, and beans.  There are many health benefits of eating a high-fiber diet, such as preventing constipation, lowering blood cholesterol, helping with weight loss, and reducing your risk of heart disease, diabetes, and certain cancers.  Gradually increase your intake of fiber. Increasing too fast can result in cramping, bloating, and gas. Drink plenty of water while you increase your fiber.  The best sources of fiber include whole fruits and vegetables, whole grains, nuts, seeds, and beans. This information is not intended to replace advice given to you by your health care provider. Make sure you discuss any questions you have with your health care provider. Document Released: 02/11/2005 Document Revised: 12/16/2016 Document Reviewed: 12/16/2016 Elsevier Patient Education  2020 [employer].

## 2019-01-02 ENCOUNTER — Other Ambulatory Visit: Payer: Self-pay

## 2019-01-02 ENCOUNTER — Ambulatory Visit
Admission: EM | Admit: 2019-01-02 | Discharge: 2019-01-02 | Disposition: A | Discharge status: Home / Self Care | Payer: 59 | Attending: Family Medicine | Admitting: Family Medicine

## 2019-01-02 DIAGNOSIS — H6123 Impacted cerumen, bilateral: Secondary | ICD-10-CM

## 2019-01-02 DIAGNOSIS — H9313 Tinnitus, bilateral: Secondary | ICD-10-CM

## 2019-01-02 NOTE — ED Triage Notes (Signed)
Patient complains of ringing in her ears. Patient states that she noticed this first 2 weeks ago, now bilaterally.

## 2019-01-02 NOTE — Discharge Instructions (Addendum)
Take over-the-counter Claritin or Zyrtec and use Flonase to help control allergies.  Monitor symptoms very closely as discussed.  Follow-up with ear nose and throat in 1 week if symptoms persist.  Follow up with your primary care physician this week as needed. Return to Urgent care for new or worsening concerns.

## 2019-01-02 NOTE — ED Provider Notes (Signed)
[facility] ____________________________________________  Time seen: Approximately 2:47 PM  I have reviewed the triage vital signs and the nursing notes.  HISTORY  Chief Complaint Tinnitus  HPI Ashley [name] is a 53 y.o. female presenting for evaluation of bilateral ears feeling clogged and ringing in her ears.  Reports right is worse than the left.  Does report the last 2 weeks she has been having some nasal congestion and postnasal drainage consistent with her allergies.  Intermittent sinus pressure but no lasting sinus pressure or sinus pain.  States ears feel clogged and somewhat muffled but not painful.  Denies any outward drainage from her ears.  States she is able to sometimes pull at her ears to get them to pop and that helps with the clogged sensation and the ringing.  Denies head injury, headache, paresthesias, unilateral weakness, chest pain, shortness of breath, neck pain, sore throat, cough, fever.  No trauma.  Denies history of the same.  Has also been recently taken doxycycline daily 1 pill for acne.  Denies any other medication changes or frequent over-the-counter medication use.  Reports otherwise doing well.  States that the ringing in her ears are very mild and she notices it more when in a very quiet area.  Pleas Koch, NP : PCP   Past Medical History:  Diagnosis Date  . Bronchitis    2 years ago   . [place] arthritis 2019   base of left thumb   . GERD (gastroesophageal reflux disease)    diet controlled , no meds  . Hydrosalpinx    bilateral   . Hyperlipidemia    recent dx - currently diet control - no meds  . Hypothyroid 2003   radio active iodine - no meds  . SVD (spontaneous vaginal delivery)    x 1    Patient Active Problem List   Diagnosis Date Noted  . Urinary frequency 11/30/2018  . Osteoarthritis 06/30/2018  . Vitamin D deficiency 06/23/2018  . Vitamin B 12 deficiency 06/23/2018  . Abdominal pain 06/23/2018  . GERD  (gastroesophageal reflux disease) 01/30/2018  . Hyperlipidemia 03/19/2017  . Elevated blood pressure reading 03/19/2017  . Allergic rhinitis 03/19/2017  . Ovarian cyst, left 02/23/2016  . Pelvic pain 02/07/2016    Past Surgical History:  Procedure Laterality Date  . CESAREAN SECTION     x 2  . CHOLECYSTECTOMY  2008  . DILATION AND CURETTAGE OF UTERUS     X 2  . LAPAROSCOPIC BILATERAL SALPINGECTOMY Bilateral 07/29/2017   Procedure: LAPAROSCOPIC LEFT SALPINGOOPHRECTOMY: Partial right Salpingectomy;  Surgeon: Lavonia Drafts, MD;  Location: [place];  Service: Gynecology;  Laterality: Bilateral;  . SHOULDER SURGERY  2016   arthroscopic  . TOTAL ABDOMINAL HYSTERECTOMY  2006   Fibroids, benign pathology; partial hysterectomy per patient   . TUBAL LIGATION    . WISDOM TOOTH EXTRACTION     at age 26 yr  . WRIST ARTHROSCOPY Right 2007     No current facility-administered medications for this encounter.   Current Outpatient Medications:  .  cetirizine (ZYRTEC) 10 MG tablet, Take 1 tablet (10 mg total) by mouth daily. For allergies., Disp: 90 tablet, Rfl: 0 .  fluticasone (FLONASE) 50 MCG/ACT nasal spray, Place 2 sprays into both nostrils daily., Disp: 16 g, Rfl: 2 .  pantoprazole (PROTONIX) 20 MG tablet, Take 1 tablet by mouth once to twice daily for heartburn., Disp: 60 tablet, Rfl: 0  Allergies Patient has no known allergies.  Family  History  Problem Relation Age of Onset  . Hypertension Mother   . Breast cancer Maternal Aunt   . Hypertension Maternal Grandmother   . Heart failure Maternal Grandmother   . Lung cancer Maternal Grandmother   . Colon cancer Neg Hx   . Colon polyps Neg Hx   . Rectal cancer Neg Hx   . Stomach cancer Neg Hx     Social History Social History   Tobacco Use  . Smoking status: Never Smoker  . Smokeless tobacco: Never Used  Substance Use Topics  . Alcohol use: No  . Drug use: No    Review of Systems Constitutional:  No fever/chills Eyes: No visual loss. ENT: No sore throat. As above  Cardiovascular: Denies chest pain. Respiratory: Denies shortness of breath. Gastrointestinal: No abdominal pain.  No nausea, no vomiting. Musculoskeletal: Negative for back pain. Skin: Negative for rash. Neurological: Negative for headaches, focal weakness or numbness.   ____________________________________________   PHYSICAL EXAM:  VITAL SIGNS: ED Triage Vitals  Enc Vitals Group     BP 01/02/19 1404 (!) 161/98     Pulse Rate 01/02/19 1404 89     Resp 01/02/19 1404 16     Temp 01/02/19 1404 98.4 F (36.9 C)     Temp Source 01/02/19 1404 Oral     SpO2 01/02/19 1404 100 %     Weight 01/02/19 1402 165 lb (74.8 kg)     Height 01/02/19 1402 5\' 6"  (1.676 m)     Head Circumference --      Peak Flow --      Pain Score 01/02/19 1402 0     Pain Loc --      Pain Edu? --      Excl. in [place]? --     Constitutional: Alert and oriented. Well appearing and in no acute distress. Eyes: Conjunctivae are normal. PERRL. EOMI. ENT      Head: Normocephalic and atraumatic.      Ears: No mastoid tenderness bilaterally.  Left: Nontender, total cerumen impaction, post cerumen removal, ear canal clear, no erythema, minimal effusion present, otherwise normal TM.  Right: Nontender, total cerumen impaction, post cerumen removal, ear canal clear, no erythema, mild effusion, otherwise normal TM. Hematological/Lymphatic/Immunilogical: No cervical lymphadenopathy. Cardiovascular: Normal rate, regular rhythm. Grossly normal heart sounds.  Good peripheral circulation. Respiratory: Normal respiratory effort without tachypnea nor retractions. Breath sounds are clear and equal bilaterally. No wheezes, rales, rhonchi. Musculoskeletal:  Steady gait.  Neurologic:  Normal speech and language. No gross focal neurologic deficits are appreciated. Speech is normal. No gait instability.  Skin:  Skin is warm, dry and intact. No rash noted. Psychiatric:  Mood and affect are normal. Speech and behavior are normal. Patient exhibits appropriate insight and judgment   ___________________________________________   LABS (all labs ordered are listed, but only abnormal results are displayed)  Labs Reviewed - No data to display ____________________________________________  PROCEDURES Procedures   Bilateral cerumen impaction removed by irrigation by CMA.  Patient tolerated well.  INITIAL IMPRESSION / ASSESSMENT AND PLAN / ED COURSE  Pertinent labs & imaging results that were available during my care of the patient were reviewed by me and considered in my medical decision making (see chart for details).  Very well-appearing patient.  No acute distress.  Counseled regarding multiple potential causes including allergic rhinitis and recent doxycycline, however exam cerumen impaction noted and once removed, patient reports improved symptoms but not full resolution.  Recommend starting daily Claritin and utilizing Flonase  to help remove bilateral effusion.  If symptoms persist recommend following up with ENT.  Supportive care.  Discussed follow up with Primary care physician this week. Discussed follow up and return parameters including no resolution or any worsening concerns. Patient verbalized understanding and agreed to plan.   ____________________________________________   FINAL CLINICAL IMPRESSION(S) / ED DIAGNOSES  Final diagnoses:  Bilateral impacted cerumen  Ringing in ears, bilateral     ED Discharge Orders    None       Note: This dictation was prepared with Dragon dictation along with smaller phrase technology. Any transcriptional errors that result from this process are unintentional.         Marylene Land, NP 01/02/19 1543

## 2019-01-16 ENCOUNTER — Ambulatory Visit
Admission: EM | Admit: 2019-01-16 | Discharge: 2019-01-16 | Disposition: A | Discharge status: Home / Self Care | Payer: 59 | Attending: Family Medicine | Admitting: Family Medicine

## 2019-01-16 ENCOUNTER — Other Ambulatory Visit: Payer: Self-pay

## 2019-01-16 DIAGNOSIS — M542 Cervicalgia: Secondary | ICD-10-CM

## 2019-01-16 DIAGNOSIS — H9313 Tinnitus, bilateral: Secondary | ICD-10-CM | POA: Diagnosis not present

## 2019-01-16 MED ORDER — MELOXICAM 15 MG PO TABS: 15.0000 mg | ORAL_TABLET | Freq: Every day | ORAL | 0 refills | Status: DC | PRN

## 2019-01-16 NOTE — ED Provider Notes (Signed)
[facility]    CSN: [id number] Arrival date & time: 01/16/19  1459      History   Chief Complaint Chief Complaint  Patient presents with  . Tinnitus   HPI  53 year old female presents with the above complaints.  Patient recently seen on 11/7 with similar symptoms.  Had bilateral cerumen impaction.  Successful irrigation here.  Was discharged home with conservative treatment regarding middle ear effusion and tinnitus.  Patient presents with persistent symptoms.  Patient reports nearly constant ringing in her ears.  She states that it is interfering with her sleep.  Bilateral.  Patient reports ongoing pain below her ears and also around the neck.  No fever.  No drainage from the ears.  No respiratory symptoms.  No known exacerbating or relieving factors.  No other associated symptoms.  No other complaints.  PMH, Surgical Hx, Family Hx, Social History reviewed and updated as below.  Past Medical History:  Diagnosis Date  . Bronchitis    2 years ago   . [place] arthritis 2019   base of left thumb   . GERD (gastroesophageal reflux disease)    diet controlled , no meds  . Hydrosalpinx    bilateral   . Hyperlipidemia    recent dx - currently diet control - no meds  . Hypothyroid 2003   radio active iodine - no meds  . SVD (spontaneous vaginal delivery)    x 1    Patient Active Problem List   Diagnosis Date Noted  . Urinary frequency 11/30/2018  . Osteoarthritis 06/30/2018  . Vitamin D deficiency 06/23/2018  . Vitamin B 12 deficiency 06/23/2018  . Abdominal pain 06/23/2018  . GERD (gastroesophageal reflux disease) 01/30/2018  . Hyperlipidemia 03/19/2017  . Elevated blood pressure reading 03/19/2017  . Allergic rhinitis 03/19/2017  . Ovarian cyst, left 02/23/2016  . Pelvic pain 02/07/2016    Past Surgical History:  Procedure Laterality Date  . CESAREAN SECTION     x 2  . CHOLECYSTECTOMY  2008  . DILATION AND CURETTAGE OF UTERUS     X 2  . LAPAROSCOPIC  BILATERAL SALPINGECTOMY Bilateral 07/29/2017   Procedure: LAPAROSCOPIC LEFT SALPINGOOPHRECTOMY: Partial right Salpingectomy;  Surgeon: Lavonia Drafts, MD;  Location: [place];  Service: Gynecology;  Laterality: Bilateral;  . SHOULDER SURGERY  2016   arthroscopic  . TOTAL ABDOMINAL HYSTERECTOMY  2006   Fibroids, benign pathology; partial hysterectomy per patient   . TUBAL LIGATION    . WISDOM TOOTH EXTRACTION     at age 55 yr  . WRIST ARTHROSCOPY Right 2007    OB History    Gravida  5   Para  3   Term  3   Preterm  0   AB  2   Living  3     SAB  2   TAB  0   Ectopic  0   Multiple  0   Live Births  3            Home Medications    Prior to Admission medications   Medication Sig Start Date End Date Taking? Authorizing Provider  cetirizine (ZYRTEC) 10 MG tablet Take 1 tablet (10 mg total) by mouth daily. For allergies. 06/16/18  Yes Pleas Koch, NP  fluticasone (FLONASE) 50 MCG/ACT nasal spray Place 2 sprays into both nostrils daily. 01/30/18  Yes Tower, Wynelle Fanny, MD  pantoprazole (PROTONIX) 20 MG tablet Take 1 tablet by mouth once to twice daily for heartburn.  10/06/18  Yes Pleas Koch, NP  meloxicam (MOBIC) 15 MG tablet Take 1 tablet (15 mg total) by mouth daily as needed for pain. 01/16/19   Coral Spikes, DO    Family History Family History  Problem Relation Age of Onset  . Hypertension Mother   . Breast cancer Maternal Aunt   . Hypertension Maternal Grandmother   . Heart failure Maternal Grandmother   . Lung cancer Maternal Grandmother   . Colon cancer Neg Hx   . Colon polyps Neg Hx   . Rectal cancer Neg Hx   . Stomach cancer Neg Hx     Social History Social History   Tobacco Use  . Smoking status: Never Smoker  . Smokeless tobacco: Never Used  Substance Use Topics  . Alcohol use: No  . Drug use: No     Allergies   Patient has no known allergies.   Review of Systems Review of Systems   Constitutional: Negative.   HENT: Positive for ear pain and tinnitus.   Musculoskeletal: Positive for neck pain.   Physical Exam Triage Vital Signs ED Triage Vitals  Enc Vitals Group     BP 01/16/19 1520 (!) 151/93     Pulse Rate 01/16/19 1520 81     Resp 01/16/19 1520 16     Temp 01/16/19 1520 98.2 F (36.8 C)     Temp Source 01/16/19 1520 Oral     SpO2 01/16/19 1520 100 %     Weight 01/16/19 1519 163 lb (73.9 kg)     Height 01/16/19 1519 5\' 6"  (1.676 m)     Head Circumference --      Peak Flow --      Pain Score 01/16/19 1519 7     Pain Loc --      Pain Edu? --      Excl. in [place]? --    Updated Vital Signs BP (!) 151/93 (BP Location: Right Arm)   Pulse 81   Temp 98.2 F (36.8 C) (Oral)   Resp 16   Ht 5\' 6"  (1.676 m)   Wt 73.9 kg   SpO2 100%   BMI 26.31 kg/m   Visual Acuity Right Eye Distance:   Left Eye Distance:   Bilateral Distance:    Right Eye Near:   Left Eye Near:    Bilateral Near:     Physical Exam Vitals signs and nursing note reviewed.  Constitutional:      General: She is not in acute distress.    Appearance: Normal appearance. She is not ill-appearing.  HENT:     Head: Normocephalic and atraumatic.     Right Ear: Tympanic membrane and ear canal normal. There is no impacted cerumen.     Left Ear: Tympanic membrane and ear canal normal. There is no impacted cerumen.  Eyes:     General:        Right eye: No discharge.        Left eye: No discharge.     Conjunctiva/sclera: Conjunctivae normal.  Neck:     Musculoskeletal: Neck supple.  Cardiovascular:     Rate and Rhythm: Normal rate and regular rhythm.     Heart sounds: No murmur.  Pulmonary:     Effort: Pulmonary effort is normal.     Breath sounds: Normal breath sounds. No wheezing, rhonchi or rales.  Lymphadenopathy:     Cervical: No cervical adenopathy.  Neurological:     Mental Status: She is alert.  Psychiatric:  Mood and Affect: Mood normal.        Behavior: Behavior  normal.    UC Treatments / Results  Labs (all labs ordered are listed, but only abnormal results are displayed) Labs Reviewed - No data to display  EKG   Radiology No results found.  Procedures Procedures (including critical care time)  Medications Ordered in UC Medications - No data to display  Initial Impression / Assessment and Plan / UC Course  I have reviewed the triage vital signs and the nursing notes.  Pertinent labs & imaging results that were available during my care of the patient were reviewed by me and considered in my medical decision making (see chart for details).    53 year old female presents with tinnitus, ear pain and neck pain. No evidence of infection. Advised to see PCP for referral to ENT. Mobic for pain. Supportive care.  Final Clinical Impressions(s) / UC Diagnoses   Final diagnoses:  Tinnitus of both ears  Neck pain     Discharge Instructions     Medication as needed for pain.  See your provider for referral to ENT.  Take care  Dr. Lacinda Axon    ED Prescriptions    Medication Sig Dispense Auth. Provider   meloxicam (MOBIC) 15 MG tablet Take 1 tablet (15 mg total) by mouth daily as needed for pain. 30 tablet Coral Spikes, DO     PDMP not reviewed this encounter.   Coral Spikes, [place] 01/16/19 2113

## 2019-01-16 NOTE — Discharge Instructions (Signed)
Medication as needed for pain.  See your provider for referral to ENT.  Take care  Dr. Lacinda Axon

## 2019-01-16 NOTE — ED Triage Notes (Signed)
Patient complains of ringing in her ears, drainage, pain below ears. Patient states that pain has been constant for over a month now. States that she had her ears flushed 2 weeks ago and has not helped.

## 2019-01-18 ENCOUNTER — Telehealth: Payer: Self-pay

## 2019-01-18 ENCOUNTER — Other Ambulatory Visit: Payer: Self-pay

## 2019-01-18 ENCOUNTER — Encounter: Payer: Self-pay | Admitting: Emergency Medicine

## 2019-01-18 ENCOUNTER — Emergency Department
Admission: EM | Admit: 2019-01-18 | Discharge: 2019-01-18 | Disposition: A | Discharge status: Home / Self Care | Payer: Self-pay | Attending: Emergency Medicine | Admitting: Emergency Medicine

## 2019-01-18 ENCOUNTER — Emergency Department: Payer: Self-pay

## 2019-01-18 DIAGNOSIS — M5412 Radiculopathy, cervical region: Secondary | ICD-10-CM | POA: Insufficient documentation

## 2019-01-18 DIAGNOSIS — Y998 Other external cause status: Secondary | ICD-10-CM | POA: Insufficient documentation

## 2019-01-18 DIAGNOSIS — Y939 Activity, unspecified: Secondary | ICD-10-CM | POA: Insufficient documentation

## 2019-01-18 DIAGNOSIS — Y929 Unspecified place or not applicable: Secondary | ICD-10-CM | POA: Insufficient documentation

## 2019-01-18 DIAGNOSIS — S161XXA Strain of muscle, fascia and tendon at neck level, initial encounter: Secondary | ICD-10-CM | POA: Insufficient documentation

## 2019-01-18 DIAGNOSIS — X58XXXA Exposure to other specified factors, initial encounter: Secondary | ICD-10-CM | POA: Insufficient documentation

## 2019-01-18 DIAGNOSIS — Z79899 Other long term (current) drug therapy: Secondary | ICD-10-CM | POA: Insufficient documentation

## 2019-01-18 DIAGNOSIS — R202 Paresthesia of skin: Secondary | ICD-10-CM | POA: Insufficient documentation

## 2019-01-18 HISTORY — DX: Anemia, unspecified: D64.9

## 2019-01-18 HISTORY — DX: Deficiency of other specified B group vitamins: E53.8

## 2019-01-18 MED ORDER — CYCLOBENZAPRINE HCL 10 MG PO TABS: 10.0000 mg | ORAL_TABLET | Freq: Three times a day (TID) | ORAL | 0 refills | Status: DC | PRN

## 2019-01-18 MED ORDER — TRAMADOL HCL 50 MG PO TABS: 50.0000 mg | ORAL_TABLET | Freq: Four times a day (QID) | ORAL | 0 refills | Status: DC | PRN

## 2019-01-18 MED ORDER — METHYLPREDNISOLONE 4 MG PO TBPK: ORAL_TABLET | ORAL | 0 refills | Status: DC

## 2019-01-18 MED ORDER — OXYCODONE-ACETAMINOPHEN 5-325 MG PO TABS
1.0000 | ORAL_TABLET | Freq: Once | ORAL | Status: AC
  Administered 2019-01-18: 1 via ORAL
  Filled 2019-01-18: qty 1

## 2019-01-18 NOTE — ED Provider Notes (Signed)
[facility] Emergency Department Provider Note  ____________________________________________   None    (approximate)   I have reviewed the triage vital signs and the nursing notes.   Patient has been triaged with a MSE exam performed by myself at a minimum. Based on symptoms and screening exam, patient may receive a more in-depth exam, labs, imaging as detailed below. Patients have been advised of this setting and exam type at the time of patient interview.    HISTORY  Chief Complaint Neck Pain    HPI Ashley [name] is a 53 y.o. female presents to the emergency department with a complaint of neck pain.  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  She is feeling a pulsing type pain at the back of her neck into the top of her head.  Some spasms in her shoulders.  Tingling all over but patient does have a B12 deficiency in which she is not taking medication for.  History of anemia but does not take medication for this.  History of a hysterectomy.  She denies any fever, chills, cough or congestion.  No known injury..   Patient will receive a medical screening exam as detailed below.  Based off of this exam, more in depth exam, labs, imaging will be performed as needed for complaint.  Patient care will be eventually transferred to another provider in the emergency department for final exam, diagnosis and disposition.    Past Medical History:  Diagnosis Date  . Anemia   . Bronchitis    2 years ago   . [place] arthritis 2019   base of left thumb   . GERD (gastroesophageal reflux disease)    diet controlled , no meds  . Hydrosalpinx    bilateral   . Hyperlipidemia    recent dx - currently diet control - no meds  . Hypothyroid 2003   radio active iodine - no meds  . SVD (spontaneous vaginal delivery)    x 1  . Vitamin B 12 deficiency     Patient Active Problem List   Diagnosis Date Noted  . Urinary frequency 11/30/2018  .  Osteoarthritis 06/30/2018  . Vitamin D deficiency 06/23/2018  . Vitamin B 12 deficiency 06/23/2018  . Abdominal pain 06/23/2018  . GERD (gastroesophageal reflux disease) 01/30/2018  . Hyperlipidemia 03/19/2017  . Elevated blood pressure reading 03/19/2017  . Allergic rhinitis 03/19/2017  . Ovarian cyst, left 02/23/2016  . Pelvic pain 02/07/2016    Past Surgical History:  Procedure Laterality Date  . CESAREAN SECTION     x 2  . CHOLECYSTECTOMY  2008  . DILATION AND CURETTAGE OF UTERUS     X 2  . LAPAROSCOPIC BILATERAL SALPINGECTOMY Bilateral 07/29/2017   Procedure: LAPAROSCOPIC LEFT SALPINGOOPHRECTOMY: Partial right Salpingectomy;  Surgeon: Lavonia Drafts, MD;  Location: [facility];  Service: Gynecology;  Laterality: Bilateral;  . SHOULDER SURGERY  2016   arthroscopic  . TOTAL ABDOMINAL HYSTERECTOMY  2006   Fibroids, benign pathology; partial hysterectomy per patient   . TUBAL LIGATION    . WISDOM TOOTH EXTRACTION     at age 17 yr  . WRIST ARTHROSCOPY Right 2007    Prior to Admission medications   Medication Sig Start Date End Date Taking? Authorizing Provider  cetirizine (ZYRTEC) 10 MG tablet Take 1 tablet (10 mg total) by mouth daily. For allergies. 06/16/18   Pleas Koch, NP  fluticasone (FLONASE) 50 MCG/ACT nasal spray Place 2 sprays  into both nostrils daily. 01/30/18   Tower, Wynelle Fanny, MD  meloxicam (MOBIC) 15 MG tablet Take 1 tablet (15 mg total) by mouth daily as needed for pain. 01/16/19   Coral Spikes, DO  pantoprazole (PROTONIX) 20 MG tablet Take 1 tablet by mouth once to twice daily for heartburn. 10/06/18   Pleas Koch, NP    Allergies Patient has no known allergies.  Family History  Problem Relation Age of Onset  . Hypertension Mother   . Breast cancer Maternal Aunt   . Hypertension Maternal Grandmother   . Heart failure Maternal Grandmother   . Lung cancer Maternal Grandmother   . Colon cancer Neg Hx   . Colon polyps Neg  Hx   . Rectal cancer Neg Hx   . Stomach cancer Neg Hx     Social History Social History   Tobacco Use  . Smoking status: Never Smoker  . Smokeless tobacco: Never Used  Substance Use Topics  . Alcohol use: No  . Drug use: No    Review of Systems Constitutional: Denies fever ENT: Denies nasal congestion/rhinorhea.  Denies sore throat Cardiovascular: Denies chest pain. Respiratory: Denies cough.  Denies shortness of breath/difficulty breathing Gastroenterology: Denies abdominal pain Musculoskeletal: Complains of neck pain, for musculoskeletal pain Integumentary: Negative for rash. Neurological: No focal weakness nor numbness.   ____________________________________________   PHYSICAL EXAM:  VITAL SIGNS: ED Triage Vitals  Enc Vitals Group     BP 01/18/19 1130 (!) 164/96     Pulse Rate 01/18/19 1130 87     Resp 01/18/19 1130 16     Temp 01/18/19 1130 99.1 F (37.3 C)     Temp Source 01/18/19 1130 Oral     SpO2 01/18/19 1130 100 %     Weight 01/18/19 1128 162 lb 14.7 oz (73.9 kg)     Height --      Head Circumference --      Peak Flow --      Pain Score 01/18/19 1127 9     Pain Loc --      Pain Edu? --      Excl. in [place]? --     Constitutional: Alert and oriented. Generally well appearing and in no acute distress. Eyes: Conjunctivae are normal.  Nose: No significant congestion/rhinnorhea. Mouth: No gross oropharyngeal edema Neck: No stridor.  No meningeal signs.   Cardiovascular: Grossly normal heart sounds. Respiratory: Normal respiratory effort without significant tachypnea and no observed retractions. Lungs CTA Musculoskeletal: No gross deformities of extremities.  C-spine is tender to palpation, spasms noted in the supraspinatus and trapezius. Neurologic:  Normal speech and language. No gross focal neurologic deficits are appreciated.  Skin:  Skin is warm, dry and intact. No rash noted.    ____________________________________________   LABS (all labs  ordered are listed, but only abnormal results are displayed)  Labs Reviewed - No data to display  ____________________________________________   RADIOLOGY X-ray of the C-spine  Official radiology report(s): No results found.  ____________________________________________    INITIAL IMPRESSION / MDM / ASSESSMENT AND PLAN / ED COURSE  As part of my medical decision making, I reviewed the following data within the [place] notes reviewed and incorporated, Old chart reviewed, Notes from prior ED visits and [place] Controlled Substance Database      Clinical Impression: Neck pain, spasms  Plan: X-ray of the C-spine, Percocet 1 p.o.  Patient has been screened based based on their arrival complaint, evaluated for an  emergent condition, and at a minimum has received a medical screening exam.  At this time, patient will receive further work-up as determined by medical screening exam.  Patient care will eventually be transferred to another provider in the emergency department for final diagnosis and disposition.    ____________________________________________  Note:  This document was prepared using Systems analyst and may include unintentional dictation errors.    Versie Starks, PA-C 01/18/19 1136    Earleen Newport, MD 01/18/19 [phone]

## 2019-01-18 NOTE — Telephone Encounter (Signed)
Patient currently at [facility] ED and is under evaluation for neck pain.

## 2019-01-18 NOTE — Telephone Encounter (Signed)
Pt was at dermatology appt and was on her way to work when pt had continuous numbness in lt arm and leg. Over weekend and now pt had h/a at base of head,neck pain that radiates to both shoulder; pain level now is 8. Pt had numbness on and off over weekend in lt arm and leg but now numbness has worsened and continuous in lt arm and leg.pt also having blurred vision and lightheaded. No CP or SOB. Pt pulled over at [facility] on [address] in [place]. I called 911 and stayed on phone until EMS found pt. Pt will go to [facility] ED and asked if I would call her son Marguerite Olea who I called x 2 and went straight to v/m. I tried [facility] again and spoke with him and Marguerite Olea will ck with [facility] ED. FYI to Gentry Fitz NP.

## 2019-01-18 NOTE — Discharge Instructions (Addendum)
Follow discharge care instruction be advised medication may cause drowsiness.  Do not take muscle relaxers or tramadol while working.

## 2019-01-18 NOTE — ED Notes (Signed)

## 2019-01-18 NOTE — ED Triage Notes (Signed)
Pt in via EMS from a parking lot across the street for left arm and neck pain for 2 weeks worsening today. No hx, meds. BP 208/107, 100%RA

## 2019-01-18 NOTE — ED Triage Notes (Signed)
C/O left sided neck pain x several weeks.  Reports today when waking, neck pain worse and c/o left arm numbness and pins and needles to left leg.  AAOx3.  Skin warm and dry. NAD.

## 2019-01-18 NOTE — ED Notes (Signed)
See triage note  Presents with pain to neck for about 2 weeks  States today the pain went from neck into left arm

## 2019-01-18 NOTE — ED Provider Notes (Signed)
[facility] Emergency Department Provider Note   ____________________________________________   First MD Initiated Contact with Patient 01/18/19 1210     (approximate)  I have reviewed the triage vital signs and the nursing notes.   HISTORY  Chief Complaint Neck Pain    HPI Ashley [name] is a 53 y.o. female patient complain of radicular neck pain for 2 weeks.  Patient a complaint worsened this morning.  Patient denies specific provocative incident for complaint.  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  Patient states she is having intimating tingling in the bilateral upper extremities.  Patient is rates her pain as a 9/10.  Patient described the pain as "sharp/tingling".     Past Medical History:  Diagnosis Date  . Anemia   . Bronchitis    2 years ago   . [place] arthritis 2019   base of left thumb   . GERD (gastroesophageal reflux disease)    diet controlled , no meds  . Hydrosalpinx    bilateral   . Hyperlipidemia    recent dx - currently diet control - no meds  . Hypothyroid 2003   radio active iodine - no meds  . SVD (spontaneous vaginal delivery)    x 1  . Vitamin B 12 deficiency     Patient Active Problem List   Diagnosis Date Noted  . Urinary frequency 11/30/2018  . Osteoarthritis 06/30/2018  . Vitamin D deficiency 06/23/2018  . Vitamin B 12 deficiency 06/23/2018  . Abdominal pain 06/23/2018  . GERD (gastroesophageal reflux disease) 01/30/2018  . Hyperlipidemia 03/19/2017  . Elevated blood pressure reading 03/19/2017  . Allergic rhinitis 03/19/2017  . Ovarian cyst, left 02/23/2016  . Pelvic pain 02/07/2016    Past Surgical History:  Procedure Laterality Date  . CESAREAN SECTION     x 2  . CHOLECYSTECTOMY  2008  . DILATION AND CURETTAGE OF UTERUS     X 2  . LAPAROSCOPIC BILATERAL SALPINGECTOMY Bilateral 07/29/2017   Procedure: LAPAROSCOPIC LEFT SALPINGOOPHRECTOMY: Partial right Salpingectomy;   Surgeon: Lavonia Drafts, MD;  Location: [facility];  Service: Gynecology;  Laterality: Bilateral;  . SHOULDER SURGERY  2016   arthroscopic  . TOTAL ABDOMINAL HYSTERECTOMY  2006   Fibroids, benign pathology; partial hysterectomy per patient   . TUBAL LIGATION    . WISDOM TOOTH EXTRACTION     at age 53 yr  . WRIST ARTHROSCOPY Right 2007    Prior to Admission medications   Medication Sig Start Date End Date Taking? Authorizing Provider  cetirizine (ZYRTEC) 10 MG tablet Take 1 tablet (10 mg total) by mouth daily. For allergies. 06/16/18   Pleas Koch, NP  cyclobenzaprine (FLEXERIL) 10 MG tablet Take 1 tablet (10 mg total) by mouth 3 (three) times daily as needed. 01/18/19   Sable Feil, PA-C  fluticasone (FLONASE) 50 MCG/ACT nasal spray Place 2 sprays into both nostrils daily. 01/30/18   Tower, Wynelle Fanny, MD  meloxicam (MOBIC) 15 MG tablet Take 1 tablet (15 mg total) by mouth daily as needed for pain. 01/16/19   Coral Spikes, DO  methylPREDNISolone (MEDROL DOSEPAK) 4 MG TBPK tablet Take Tapered dose as directed 01/18/19   Sable Feil, PA-C  pantoprazole (PROTONIX) 20 MG tablet Take 1 tablet by mouth once to twice daily for heartburn. 10/06/18   Pleas Koch, NP  traMADol (ULTRAM) 50 MG tablet Take 1 tablet (50 mg total) by mouth every  6 (six) hours as needed for moderate pain. 01/18/19   Sable Feil, PA-C    Allergies Patient has no known allergies.  Family History  Problem Relation Age of Onset  . Hypertension Mother   . Breast cancer Maternal Aunt   . Hypertension Maternal Grandmother   . Heart failure Maternal Grandmother   . Lung cancer Maternal Grandmother   . Colon cancer Neg Hx   . Colon polyps Neg Hx   . Rectal cancer Neg Hx   . Stomach cancer Neg Hx     Social History Social History   Tobacco Use  . Smoking status: Never Smoker  . Smokeless tobacco: Never Used  Substance Use Topics  . Alcohol use: No  . Drug use: No     Review of Systems Constitutional: No fever/chills Eyes: No visual changes. ENT: No sore throat. Cardiovascular: Denies chest pain. Respiratory: Denies shortness of breath. Gastrointestinal: No abdominal pain.  No nausea, no vomiting.  No diarrhea.  No constipation. Genitourinary: Negative for dysuria. Musculoskeletal: Neck pain.   Skin: Negative for rash. Neurological: Negative for headaches, focal weakness or numbness.   ____________________________________________   PHYSICAL EXAM:  VITAL SIGNS: ED Triage Vitals  Enc Vitals Group     BP 01/18/19 1130 (!) 164/96     Pulse Rate 01/18/19 1130 87     Resp 01/18/19 1130 16     Temp 01/18/19 1130 99.1 F (37.3 C)     Temp Source 01/18/19 1130 Oral     SpO2 01/18/19 1130 100 %     Weight 01/18/19 1128 162 lb 14.7 oz (73.9 kg)     Height --      Head Circumference --      Peak Flow --      Pain Score 01/18/19 1127 9     Pain Loc --      Pain Edu? --      Excl. in [place]? --     Constitutional: Alert and oriented. Well appearing and in no acute distress. Neck:   cervical spine tenderness to palpation at C4-C6. Cardiovascular: Normal rate, regular rhythm. Grossly normal heart sounds.  Good peripheral circulation.  Elevated blood pressure. Respiratory: Normal respiratory effort.  No retractions. Lungs CTAB. Musculoskeletal: No obvious cervical deformity.  Patient is full equal range of motion of the neck.  Patient has moderate guarding palpation C4-C6.  Patient has full equal range of motion of the upper extremities.   Neurologic:  Normal speech and language. No gross focal neurologic deficits are appreciated. No gait instability. Skin:  Skin is warm, dry and intact. No rash noted. Psychiatric: Mood and affect are normal. Speech and behavior are normal.  ____________________________________________   LABS (all labs ordered are listed, but only abnormal results are displayed)  Labs Reviewed - No data to display  ____________________________________________  EKG   ____________________________________________  RADIOLOGY  ED MD interpretation:    Official radiology report(s): Dg Cervical Spine 2-3 Views  Result Date: 01/18/2019 CLINICAL DATA:  Cervicalgia EXAM: CERVICAL SPINE - 2-3 VIEW COMPARISON:  None. FINDINGS: Frontal, lateral, and open-mouth odontoid images were obtained. There is no fracture or spondylolisthesis. Prevertebral soft tissues and predental space regions are normal. There is mild disc space narrowing at C4-5 and C5-6. Other disc spaces appear unremarkable. No erosive change. There is lack of lordosis. Lung apices are clear. IMPRESSION: No fracture or spondylolisthesis. Mild disc space narrowing at C4-5 and C5-6. Of lordosis may be indicative of a degree of muscle spasm.  Electronically Signed   By: Lowella Grip III M.D.   On: 01/18/2019 12:51    ____________________________________________   PROCEDURES  Procedure(s) performed (including Critical Care):  Procedures   ____________________________________________   INITIAL IMPRESSION / ASSESSMENT AND PLAN / ED COURSE  As part of my medical decision making, I reviewed the following data within the [place]     Patient presents with 2-week of her worsening neck pain with radicular component to the upper extremities.  Physical exam remarkable only for moderate guarding palpation of spinal processes.  Discussed x-ray findings with patient consistent with cervical strain.  Patient given discharge care instruction and a work note.  Patient advised to follow-up with her PCP for continued care.    Ashley [name] was evaluated in Emergency Department on 01/18/2019 for the symptoms described in the history of present illness. She was evaluated in the context of the global COVID-19 pandemic, which necessitated consideration that the patient might be at risk for infection with the SARS-CoV-2 virus that  causes COVID-19. Institutional protocols and algorithms that pertain to the evaluation of patients at risk for COVID-19 are in a state of rapid change based on information released by regulatory bodies including the CDC and federal and state organizations. These policies and algorithms were followed during the patient's care in the ED.       ____________________________________________   FINAL CLINICAL IMPRESSION(S) / ED DIAGNOSES  Final diagnoses:  Cervical radiculopathy  Acute strain of neck muscle, initial encounter     ED Discharge Orders         Ordered    methylPREDNISolone (MEDROL DOSEPAK) 4 MG TBPK tablet     01/18/19 1320    cyclobenzaprine (FLEXERIL) 10 MG tablet  3 times daily PRN     01/18/19 1320    traMADol (ULTRAM) 50 MG tablet  Every 6 hours PRN     01/18/19 1320           Note:  This document was prepared using Dragon voice recognition software and may include unintentional dictation errors.    Sable Feil, PA-C 01/18/19 1327    Blake Divine, MD 01/19/19 [phone]

## 2019-01-20 ENCOUNTER — Ambulatory Visit: Payer: Self-pay | Admitting: Primary Care

## 2019-01-20 ENCOUNTER — Ambulatory Visit: Payer: Self-pay | Admitting: Family Medicine

## 2019-02-01 ENCOUNTER — Ambulatory Visit: Payer: Self-pay | Admitting: Primary Care

## 2019-02-01 ENCOUNTER — Other Ambulatory Visit: Payer: Self-pay

## 2019-02-01 VITALS — BP 140/86 | HR 81 | Temp 97.7°F | Ht 66.0 in | Wt 164.8 lb

## 2019-02-01 DIAGNOSIS — R200 Anesthesia of skin: Secondary | ICD-10-CM | POA: Diagnosis not present

## 2019-02-01 DIAGNOSIS — R030 Elevated blood-pressure reading, without diagnosis of hypertension: Secondary | ICD-10-CM | POA: Diagnosis not present

## 2019-02-01 DIAGNOSIS — E538 Deficiency of other specified B group vitamins: Secondary | ICD-10-CM

## 2019-02-01 DIAGNOSIS — H9313 Tinnitus, bilateral: Secondary | ICD-10-CM | POA: Diagnosis not present

## 2019-02-01 DIAGNOSIS — R202 Paresthesia of skin: Secondary | ICD-10-CM

## 2019-02-01 LAB — BASIC METABOLIC PANEL
BUN: 14 mg/dL (ref 6–23)
CO2: 30 mEq/L (ref 19–32)
Calcium: 9.9 mg/dL (ref 8.4–10.5)
Chloride: 102 mEq/L (ref 96–112)
Creatinine, Ser: 0.73 mg/dL (ref 0.40–1.20)
GFR: 100.55 mL/min (ref >60.00)
Glucose, Bld: 99 mg/dL (ref 70–99)
Potassium: 4.7 mEq/L (ref 3.5–5.1)
Sodium: 138 mEq/L (ref 135–145)

## 2019-02-01 LAB — CBC
HCT: 41 % (ref 36.0–46.0)
Hemoglobin: 13.2 g/dL (ref 12.0–15.0)
MCHC: 32.1 g/dL (ref 30.0–36.0)
MCV: 87.3 fl (ref 78.0–100.0)
Platelets: 234 10*3/uL (ref 150.0–400.0)
RBC: 4.7 Mil/uL (ref 3.87–5.11)
RDW: 15.6 % — ABNORMAL HIGH (ref 11.5–15.5)
WBC: 4.1 10*3/uL (ref 4.0–10.5)

## 2019-02-01 LAB — VITAMIN B12: Vitamin B-12: 541 pg/mL (ref 211–911)

## 2019-02-01 LAB — HEMOGLOBIN A1C: Hgb A1c MFr Bld: 5.7 % (ref 4.6–6.5)

## 2019-02-01 LAB — TSH: TSH: 1.75 u[IU]/mL (ref 0.35–4.50)

## 2019-02-01 NOTE — Patient Instructions (Signed)
Stop by the lab prior to leaving today. I will notify you of your results once received.   You will be contacted regarding your referral to ENT.  Please let us know if you have not been contacted within two weeks.   Resume Flonase nasal spray and Zyrtec daily as discussed.  It was a pleasure to see you today!

## 2019-02-01 NOTE — Assessment & Plan Note (Signed)
Acute to left hand mostly, does type often with recent reduction in frequency.  Check B12, BMP, A1C, CBC today. Encouraged rest, bracing.  Await results.

## 2019-02-01 NOTE — Assessment & Plan Note (Signed)
Above goal today, also above goal at Urgent Care visits. She will closely monitor readings at home and will update if she continues to see readings at 135/90 or higher.  Tinnitus could be from elevated BP, discussed this with patient.

## 2019-02-01 NOTE — Assessment & Plan Note (Signed)
Acute and persistent for the last month. BP noted to be slightly above goal, this could be contributing.  Given recurrent symptoms we will send her to ENT for evaluation. Discussed to resume Flonase and Zyrtec. Referral placed.

## 2019-02-01 NOTE — Progress Notes (Signed)
Subjective:    Patient ID: Ashley [name], female    DOB: 1966/01/04, 53 y.o.   MRN: [id number]  HPI  Ashley [name] is a 53 year old female   She has been evaluated at Urgent Care and the ED several times in November 2020 with chief complaints of cerumen impaction, tinnitus of her ears, and neck pain.   Bilateral ears were irrigated at [facility] on 01/02/19, however, she returned on 01/16/19 with continued tinnitus that was interfering with her sleep, also with neck pain. During this visit she was provided with a prescription for Mobic, no infection or impaction. Evaluated on 01/18/19 in the ED for neck pain with left upper extremity numbness and intermittent full body tingling, underwent plain films which were negative except for potential muscle spasm. Diagnosed with neck spasms and provided a one time dose of Percocet and a prescription for prednisone and Flexeril.   Since her Urgent Care and ED visits she continue to notice tinnitus to her ears with fullness, mostly to her right ear. She continues to notice intermittent tingling to the left hand, history of recurrent typing that has since been reduced in frequency. Neck pain is overall better, using Flexeril PRN and is needing a refill.   She uses Flonase and Zyrtec PRN for allergies, recent use two days ago.   Review of Systems  Constitutional: Negative for fever.  HENT: Positive for postnasal drip, rhinorrhea and tinnitus. Negative for congestion and sore throat.   Respiratory: Negative for cough.   Musculoskeletal: Positive for neck pain.  Allergic/Immunologic: Positive for environmental allergies.       Past Medical History:  Diagnosis Date   Anemia    Bronchitis    2 years ago    [place] arthritis 2019   base of left thumb    GERD (gastroesophageal reflux disease)    diet controlled , no meds   Hydrosalpinx    bilateral    Hyperlipidemia    recent dx - currently diet control - no meds   Hypothyroid  2003   radio active iodine - no meds   SVD (spontaneous vaginal delivery)    x 1   Vitamin B 12 deficiency      Social History   Socioeconomic History   Marital status: Single    Spouse name: Not on file   Number of children: Not on file   Years of education: Not on file   Highest education level: Not on file  Occupational History   Not on file  Social Needs   Financial resource strain: Not on file   Food insecurity    Worry: Not on file    Inability: Not on file   Transportation needs    Medical: Not on file    Non-medical: Not on file  Tobacco Use   Smoking status: Never Smoker   Smokeless tobacco: Never Used  Substance and Sexual Activity   Alcohol use: No   Drug use: No   Sexual activity: Yes    Partners: Male    Birth control/protection: Surgical    Comment: hysterectomy.  Lifestyle   Physical activity    Days per week: Not on file    Minutes per session: Not on file   Stress: Not on file  Relationships   Social connections    Talks on phone: Not on file    Gets together: Not on file    Attends religious service: Not on file    Active member  of club or organization: Not on file    Attends meetings of clubs or organizations: Not on file    Relationship status: Not on file   Intimate partner violence    Fear of current or ex partner: Not on file    Emotionally abused: Not on file    Physically abused: Not on file    Forced sexual activity: Not on file  Other Topics Concern   Not on file  Social History Narrative   Single.   3 children.   Works at [employer] as a Designer, multimedia.   Enjoys relaxing, exercise, walking her dog.     Past Surgical History:  Procedure Laterality Date   CESAREAN SECTION     x 2   CHOLECYSTECTOMY  2008   DILATION AND CURETTAGE OF UTERUS     X 2   LAPAROSCOPIC BILATERAL SALPINGECTOMY Bilateral 07/29/2017   Procedure: LAPAROSCOPIC LEFT SALPINGOOPHRECTOMY: Partial right Salpingectomy;  Surgeon: Lavonia Drafts, MD;  Location: [place];  Service: Gynecology;  Laterality: Bilateral;   SHOULDER SURGERY  2016   arthroscopic   TOTAL ABDOMINAL HYSTERECTOMY  2006   Fibroids, benign pathology; partial hysterectomy per patient    TUBAL LIGATION     WISDOM TOOTH EXTRACTION     at age 33 yr   WRIST ARTHROSCOPY Right 2007    Family History  Problem Relation Age of Onset   Hypertension Mother    Breast cancer Maternal Aunt    Hypertension Maternal Grandmother    Heart failure Maternal Grandmother    Lung cancer Maternal Grandmother    Colon cancer Neg Hx    Colon polyps Neg Hx    Rectal cancer Neg Hx    Stomach cancer Neg Hx     No Known Allergies  Current Outpatient Medications on File Prior to Visit  Medication Sig Dispense Refill   cetirizine (ZYRTEC) 10 MG tablet Take 1 tablet (10 mg total) by mouth daily. For allergies. 90 tablet 0   cyclobenzaprine (FLEXERIL) 10 MG tablet Take 1 tablet (10 mg total) by mouth 3 (three) times daily as needed. 15 tablet 0   fluticasone (FLONASE) 50 MCG/ACT nasal spray Place 2 sprays into both nostrils daily. 16 g 2   meloxicam (MOBIC) 15 MG tablet Take 1 tablet (15 mg total) by mouth daily as needed for pain. 30 tablet 0   pantoprazole (PROTONIX) 20 MG tablet Take 1 tablet by mouth once to twice daily for heartburn. 60 tablet 0   No current facility-administered medications on file prior to visit.     BP 140/86    Pulse 81    Temp 97.7 F (36.5 C) (Temporal)    Ht 5\' 6"  (1.676 m)    Wt 164 lb 12 oz (74.7 kg)    SpO2 100%    BMI 26.59 kg/m    Objective:   Physical Exam  Constitutional: She appears well-nourished.  HENT:  Right Ear: Tympanic membrane and ear canal normal.  Left Ear: Tympanic membrane and ear canal normal.  Eyes: Conjunctivae are normal.  Cardiovascular: Normal rate and regular rhythm.  Respiratory: Effort normal and breath sounds normal.           Assessment & Plan:

## 2019-02-01 NOTE — Assessment & Plan Note (Signed)
Repeat B12 pending given history of numbness/tingling and B12 deficiency.

## 2019-02-12 ENCOUNTER — Emergency Department: Payer: 59

## 2019-02-12 ENCOUNTER — Observation Stay
Admission: EM | Admit: 2019-02-12 | Discharge: 2019-02-14 | Disposition: A | Discharge status: Home / Self Care | Payer: 59 | Attending: Internal Medicine | Admitting: Internal Medicine

## 2019-02-12 ENCOUNTER — Other Ambulatory Visit: Payer: Self-pay

## 2019-02-12 DIAGNOSIS — E785 Hyperlipidemia, unspecified: Secondary | ICD-10-CM

## 2019-02-12 DIAGNOSIS — G459 Transient cerebral ischemic attack, unspecified: Secondary | ICD-10-CM | POA: Diagnosis not present

## 2019-02-12 DIAGNOSIS — K219 Gastro-esophageal reflux disease without esophagitis: Secondary | ICD-10-CM | POA: Diagnosis not present

## 2019-02-12 DIAGNOSIS — E039 Hypothyroidism, unspecified: Secondary | ICD-10-CM | POA: Insufficient documentation

## 2019-02-12 DIAGNOSIS — Z8249 Family history of ischemic heart disease and other diseases of the circulatory system: Secondary | ICD-10-CM | POA: Insufficient documentation

## 2019-02-12 DIAGNOSIS — Z79899 Other long term (current) drug therapy: Secondary | ICD-10-CM | POA: Insufficient documentation

## 2019-02-12 DIAGNOSIS — I160 Hypertensive urgency: Secondary | ICD-10-CM | POA: Diagnosis not present

## 2019-02-12 DIAGNOSIS — M1990 Unspecified osteoarthritis, unspecified site: Secondary | ICD-10-CM | POA: Insufficient documentation

## 2019-02-12 DIAGNOSIS — Z791 Long term (current) use of non-steroidal anti-inflammatories (NSAID): Secondary | ICD-10-CM | POA: Diagnosis not present

## 2019-02-12 DIAGNOSIS — I639 Cerebral infarction, unspecified: Secondary | ICD-10-CM

## 2019-02-12 DIAGNOSIS — Z20828 Contact with and (suspected) exposure to other viral communicable diseases: Secondary | ICD-10-CM | POA: Diagnosis not present

## 2019-02-12 DIAGNOSIS — I10 Essential (primary) hypertension: Secondary | ICD-10-CM

## 2019-02-12 DIAGNOSIS — R531 Weakness: Secondary | ICD-10-CM

## 2019-02-12 HISTORY — DX: Weakness: R53.1

## 2019-02-12 LAB — DIFFERENTIAL
Abs Immature Granulocytes: 0 10*3/uL (ref 0.00–0.07)
Basophils Absolute: 0 10*3/uL (ref 0.0–0.1)
Basophils Relative: 1 %
Eosinophils Absolute: 0 10*3/uL (ref 0.0–0.5)
Eosinophils Relative: 1 %
Immature Granulocytes: 0 %
Lymphocytes Relative: 38 %
Lymphs Abs: 2 10*3/uL (ref 0.7–4.0)
Monocytes Absolute: 0.5 10*3/uL (ref 0.1–1.0)
Monocytes Relative: 9 %
Neutro Abs: 2.7 10*3/uL (ref 1.7–7.7)
Neutrophils Relative %: 51 %

## 2019-02-12 LAB — CBC
HCT: 40.4 % (ref 36.0–46.0)
Hemoglobin: 13.8 g/dL (ref 12.0–15.0)
MCH: 27.9 pg (ref 26.0–34.0)
MCHC: 34.2 g/dL (ref 30.0–36.0)
MCV: 81.6 fL (ref 80.0–100.0)
Platelets: 270 10*3/uL (ref 150–400)
RBC: 4.95 MIL/uL (ref 3.87–5.11)
RDW: 15.7 % — ABNORMAL HIGH (ref 11.5–15.5)
WBC: 5.2 10*3/uL (ref 4.0–10.5)
nRBC: 0 % (ref 0.0–0.2)

## 2019-02-12 LAB — COMPREHENSIVE METABOLIC PANEL
ALT: 23 U/L (ref 0–44)
AST: 30 U/L (ref 15–41)
Albumin: 4.9 g/dL (ref 3.5–5.0)
Alkaline Phosphatase: 92 U/L (ref 38–126)
Anion gap: 11 (ref 5–15)
BUN: 12 mg/dL (ref 6–20)
CO2: 26 mmol/L (ref 22–32)
Calcium: 10.3 mg/dL (ref 8.9–10.3)
Chloride: 101 mmol/L (ref 98–111)
Creatinine, Ser: 0.7 mg/dL (ref 0.44–1.00)
GFR calc Af Amer: >60 mL/min (ref >60)
GFR calc non Af Amer: >60 mL/min (ref >60)
Glucose, Bld: 120 mg/dL — ABNORMAL HIGH (ref 70–99)
Potassium: 3.9 mmol/L (ref 3.5–5.1)
Sodium: 138 mmol/L (ref 135–145)
Total Bilirubin: 0.9 mg/dL (ref 0.3–1.2)
Total Protein: 8.7 g/dL — ABNORMAL HIGH (ref 6.5–8.1)

## 2019-02-12 LAB — URINALYSIS, ROUTINE W REFLEX MICROSCOPIC
Bilirubin Urine: NEGATIVE
Glucose, UA: NEGATIVE mg/dL
Hgb urine dipstick: NEGATIVE
Ketones, ur: NEGATIVE mg/dL
Leukocytes,Ua: NEGATIVE
Nitrite: NEGATIVE
Protein, ur: NEGATIVE mg/dL
Specific Gravity, Urine: 1.004 — ABNORMAL LOW (ref 1.005–1.030)
pH: 6 (ref 5.0–8.0)

## 2019-02-12 LAB — URINE DRUG SCREEN, QUALITATIVE (ARMC ONLY)
Amphetamines, Ur Screen: NOT DETECTED
Barbiturates, Ur Screen: NOT DETECTED
Benzodiazepine, Ur Scrn: NOT DETECTED
Cannabinoid 50 Ng, Ur ~~LOC~~: NOT DETECTED
Cocaine Metabolite,Ur ~~LOC~~: NOT DETECTED
MDMA (Ecstasy)Ur Screen: NOT DETECTED
Methadone Scn, Ur: NOT DETECTED
Opiate, Ur Screen: NOT DETECTED
Phencyclidine (PCP) Ur S: NOT DETECTED
Tricyclic, Ur Screen: NOT DETECTED

## 2019-02-12 LAB — PROTIME-INR
INR: 0.9 (ref 0.8–1.2)
Prothrombin Time: 11.5 seconds (ref 11.4–15.2)

## 2019-02-12 LAB — APTT: aPTT: 29 seconds (ref 24–36)

## 2019-02-12 LAB — ETHANOL: Alcohol, Ethyl (B): <10 mg/dL (ref <10)

## 2019-02-12 LAB — POCT PREGNANCY, URINE: Preg Test, Ur: NEGATIVE

## 2019-02-12 LAB — GLUCOSE, CAPILLARY: Glucose-Capillary: 98 mg/dL (ref 70–99)

## 2019-02-12 MED ORDER — LABETALOL HCL 5 MG/ML IV SOLN: 10.0000 mg | Freq: Once | INTRAVENOUS | Status: AC

## 2019-02-12 MED ORDER — ALTEPLASE 100 MG IV SOLR
INTRAVENOUS | Status: AC
  Filled 2019-02-12: qty 100

## 2019-02-12 MED ORDER — LABETALOL HCL 5 MG/ML IV SOLN
INTRAVENOUS | Status: AC
  Administered 2019-02-12: 10 mg via INTRAVENOUS
  Filled 2019-02-12: qty 4

## 2019-02-12 MED ORDER — IOHEXOL 350 MG/ML SOLN
75.0000 mL | Freq: Once | INTRAVENOUS | Status: AC | PRN
  Administered 2019-02-12: 75 mL via INTRAVENOUS

## 2019-02-12 NOTE — Consult Note (Signed)
[facility] Tele[facility]rology Consult Services   Date of Service:   02/12/2019 21:17:05  Impression:     .  I63.9 - Cerebrovascular accident (CVA), unspecified mechanism ([place])  Comments/Sign-Out: 53 year old female who presents to the hospital with left side weakness and numbness in the setting of elevated BP. Presentation likely secondary to acute small vessel stroke vs hypertensive urgency.  Metrics: Last Known Well: 02/12/2019 20:00:00 [facility] Notification Time: 02/12/2019 21:17:05 Arrival Time: 02/12/2019 20:49:00 Stamp Time: 02/12/2019 21:17:05 Time First Login Attempt: 02/12/2019 21:23:00 Video Start Time: 02/12/2019 21:23:00  Symptoms: Left side numbness and weakness NIHSS Start Assessment Time: 02/12/2019 21:25:00 Alteplase Early Mix Decision Time: 02/12/2019 21:40:00 Patient is not a candidate for Alteplase/Activase. Patient was not deemed candidate for Alteplase/Activase thrombolytics because of Patient refused. Video End Time: 02/12/2019 21:56:51  CT head showed no acute hemorrhage or acute core infarct.  Lower Likelihood of Large Vessel Occlusion but Following Stat Studies are Recommended  CTA Head and Neck.   ED Physician notified of diagnostic impression and management plan on 02/12/2019 22:10:33  Our recommendations are outlined below.  Recommendations:     .  Activate Stroke Protocol Admission/Order Set     .  Stroke/Telemetry Floor     .  Neuro Checks     .  Bedside Swallow Eval     .  DVT Prophylaxis     .  IV Fluids, Normal Saline     .  Head of Bed 30 Degrees     .  Euglycemia and Avoid Hyperthermia (PRN Acetaminophen)     .  Antiplatelet Therapy Recommended  Routine Consultation with [facility] for Follow up Care  Sign Out:     .  Discussed with Emergency Department Provider    ------------------------------------------------------------------------------  History of Present Illness: Patient is a 53 year  old Female.  Patient was brought by private transportation with symptoms of Left side numbness and weakness  53 year old female who presents to the hospital because of left side weakness and numbness. patient initially started having numbness in the left side of her face and then developed numbness and weakness in her left arm and then leg. On arrival in the ED patient was hypertensive and had left side numbness and had trouble lifting her left leg.           Examination: BP(183/99), Pulse(106), Blood Glucose(98) 1A: Level of Consciousness - Alert; keenly responsive + 0 1B: Ask Month and Age - Both Questions Right + 0 1C: Blink Eyes & Squeeze Hands - Performs Both Tasks + 0 2: Test Horizontal Extraocular Movements - Normal + 0 3: Test Visual Fields - No Visual Loss + 0 4: Test Facial Palsy (Use Grimace if Obtunded) - Normal symmetry + 0 5A: Test Left Arm Motor Drift - No Drift for 10 Seconds + 0 5B: Test Right Arm Motor Drift - No Drift for 10 Seconds + 0 6A: Test Left Leg Motor Drift - Drift, but doesn't hit bed + 1 6B: Test Right Leg Motor Drift - No Drift for 5 Seconds + 0 7: Test Limb Ataxia (FNF/Heel-Shin) - No Ataxia + 0 8: Test Sensation - Mild-Moderate Loss: Less Sharp/More Dull + 1 9: Test Language/Aphasia - Normal; No aphasia + 0 10: Test Dysarthria - Normal + 0 11: Test Extinction/Inattention - No abnormality + 0  NIHSS Score: 2  Pre-Morbid Modified Ranking Scale: 0 Points = No symptoms at all  Patient/Family was informed the Neurology Consult would happen  via TeleHealth consult by way of interactive audio and video telecommunications and consented to receiving care in this manner.   Due to the immediate potential for life-threatening deterioration due to underlying acute neurologic illness, I spent 30 minutes providing critical care. This time includes time for face to face visit via telemedicine, review of medical records, imaging studies and discussion of findings  with providers, the patient and/or family.   Dr Tsosie Billing   [facility] [phone]  Case IV:[id number]

## 2019-02-12 NOTE — ED Notes (Addendum)
Teleneuro on at 2115

## 2019-02-12 NOTE — ED Notes (Signed)
Pt speaking with this RN in NAD, speech clear

## 2019-02-12 NOTE — ED Notes (Signed)
Pt able to walk with minimal assistance to bathroom.

## 2019-02-12 NOTE — ED Notes (Signed)
Pt symptoms are slightly resolving. Per neuro MD, NIH is a 2.

## 2019-02-12 NOTE — ED Notes (Signed)
Pt does not want TPA at this time.

## 2019-02-12 NOTE — ED Triage Notes (Signed)
Pt states L sided facial numbness that travels down arm at 8:00pm. Sometimes "pins and needles travel down to my leg". Pt also reports 200/100s Bps which are not normal for her.

## 2019-02-13 ENCOUNTER — Observation Stay (HOSPITAL_BASED_OUTPATIENT_CLINIC_OR_DEPARTMENT_OTHER)
Admit: 2019-02-13 | Discharge: 2019-02-13 | Disposition: A | Discharge status: Home / Self Care | Payer: 59 | Attending: Family Medicine | Admitting: Family Medicine

## 2019-02-13 ENCOUNTER — Observation Stay: Payer: 59

## 2019-02-13 DIAGNOSIS — G459 Transient cerebral ischemic attack, unspecified: Secondary | ICD-10-CM | POA: Diagnosis not present

## 2019-02-13 DIAGNOSIS — I361 Nonrheumatic tricuspid (valve) insufficiency: Secondary | ICD-10-CM | POA: Diagnosis not present

## 2019-02-13 DIAGNOSIS — Z8673 Personal history of transient ischemic attack (TIA), and cerebral infarction without residual deficits: Secondary | ICD-10-CM | POA: Insufficient documentation

## 2019-02-13 DIAGNOSIS — R030 Elevated blood-pressure reading, without diagnosis of hypertension: Secondary | ICD-10-CM | POA: Diagnosis not present

## 2019-02-13 DIAGNOSIS — E785 Hyperlipidemia, unspecified: Secondary | ICD-10-CM | POA: Diagnosis not present

## 2019-02-13 DIAGNOSIS — R531 Weakness: Secondary | ICD-10-CM | POA: Diagnosis not present

## 2019-02-13 LAB — HEMOGLOBIN A1C
Hgb A1c MFr Bld: 6 % — ABNORMAL HIGH (ref 4.8–5.6)
Mean Plasma Glucose: 125.5 mg/dL

## 2019-02-13 LAB — HIV ANTIBODY (ROUTINE TESTING W REFLEX): HIV Screen 4th Generation wRfx: NONREACTIVE

## 2019-02-13 LAB — LIPID PANEL
Cholesterol: 221 mg/dL — ABNORMAL HIGH (ref 0–200)
HDL: 77 mg/dL (ref >40)
LDL Cholesterol: 135 mg/dL — ABNORMAL HIGH (ref 0–99)
Total CHOL/HDL Ratio: 2.9 RATIO
Triglycerides: 45 mg/dL (ref <150)
VLDL: 9 mg/dL (ref 0–40)

## 2019-02-13 LAB — ECHOCARDIOGRAM COMPLETE
Height: 66 in
Weight: 2640 oz

## 2019-02-13 LAB — SARS CORONAVIRUS 2 (TAT 6-24 HRS): SARS Coronavirus 2: NEGATIVE

## 2019-02-13 MED ORDER — PANTOPRAZOLE SODIUM 40 MG PO TBEC
40.0000 mg | DELAYED_RELEASE_TABLET | Freq: Every day | ORAL | Status: DC
  Administered 2019-02-13 – 2019-02-14 (×2): 40 mg via ORAL
  Filled 2019-02-13 (×2): qty 1

## 2019-02-13 MED ORDER — LABETALOL HCL 5 MG/ML IV SOLN
20.0000 mg | INTRAVENOUS | Status: DC | PRN
  Filled 2019-02-13: qty 4

## 2019-02-13 MED ORDER — ACETAMINOPHEN 650 MG RE SUPP: 650.0000 mg | RECTAL | Status: DC | PRN

## 2019-02-13 MED ORDER — ASPIRIN 81 MG PO TBEC
81.0000 mg | DELAYED_RELEASE_TABLET | Freq: Every day | ORAL | Status: DC
  Administered 2019-02-13 – 2019-02-14 (×2): 81 mg via ORAL
  Filled 2019-02-13 (×4): qty 1

## 2019-02-13 MED ORDER — SENNOSIDES-DOCUSATE SODIUM 8.6-50 MG PO TABS
1.0000 | ORAL_TABLET | Freq: Every evening | ORAL | Status: DC | PRN
  Administered 2019-02-14: 08:00:00 1 via ORAL
  Filled 2019-02-13 (×2): qty 1

## 2019-02-13 MED ORDER — LORATADINE 10 MG PO TABS
10.0000 mg | ORAL_TABLET | Freq: Every day | ORAL | Status: DC
  Administered 2019-02-13 – 2019-02-14 (×2): 10 mg via ORAL
  Filled 2019-02-13 (×2): qty 1

## 2019-02-13 MED ORDER — CYCLOBENZAPRINE HCL 10 MG PO TABS: 10.0000 mg | ORAL_TABLET | Freq: Three times a day (TID) | ORAL | Status: DC | PRN

## 2019-02-13 MED ORDER — STROKE: EARLY STAGES OF RECOVERY BOOK: Freq: Once | Status: AC

## 2019-02-13 MED ORDER — FLUTICASONE PROPIONATE 50 MCG/ACT NA SUSP
2.0000 | Freq: Every day | NASAL | Status: DC
  Administered 2019-02-14: 10:00:00 2 via NASAL
  Filled 2019-02-13 (×2): qty 16

## 2019-02-13 MED ORDER — SIMVASTATIN 20 MG PO TABS
20.0000 mg | ORAL_TABLET | Freq: Every day | ORAL | Status: DC
  Administered 2019-02-13: 20 mg via ORAL
  Filled 2019-02-13: qty 1
  Filled 2019-02-13: qty 2

## 2019-02-13 MED ORDER — SODIUM CHLORIDE 0.9 % IV SOLN: INTRAVENOUS | Status: DC

## 2019-02-13 MED ORDER — ACETAMINOPHEN 160 MG/5ML PO SOLN
650.0000 mg | ORAL | Status: DC | PRN
  Filled 2019-02-13: qty 20.3

## 2019-02-13 MED ORDER — ACETAMINOPHEN 325 MG PO TABS
650.0000 mg | ORAL_TABLET | ORAL | Status: DC | PRN
  Administered 2019-02-13 – 2019-02-14 (×4): 650 mg via ORAL
  Filled 2019-02-13 (×4): qty 2

## 2019-02-13 MED ORDER — ENOXAPARIN SODIUM 40 MG/0.4ML ~~LOC~~ SOLN
40.0000 mg | SUBCUTANEOUS | Status: DC
  Administered 2019-02-13 – 2019-02-14 (×2): 40 mg via SUBCUTANEOUS
  Filled 2019-02-13 (×3): qty 0.4

## 2019-02-13 NOTE — ED Notes (Signed)
Pt ambulated to toilet with stand by assist, pt reports left leg is weak but she is able to bear weight on the left leg

## 2019-02-13 NOTE — Evaluation (Signed)
Physical Therapy Evaluation Patient Details Name: Ashley [name] MRN: [id number] DOB: 04/21/1965 Today's Date: 02/13/2019   History of Present Illness  53 y.o. female with a history of HLD who reports that at 2000 on last evening she had acute onset of left sided numbness and weakness including the face, arm and leg.  Patient presented for evaluation at that time.  BP elevated.  Initial NIHSS of 2. Patient reported symptoms have improved today with some LLE numbness still remaining.  She reports symptoms began to improve after about 2-3 hours. MRI negative for acute infarct.  Clinical Impression  Pt is a 53 year old F who lives in a two story home with her three sons.  Pt is generally active without an AD at baseline.  She performed bed mobility slowly but did not need external assistance.  Pt able to sit with good balance and perform STS with close CGA.  Pt generally unsteady on feet and wt shifts to R side to avoid L knee buckling.  She was able to complete 25 ft of ambulation slowly using RW, with education for use.  Pt able to verbalize understanding of stair negotiation but will benefit from further instruction.  She presented with good strength of LE's with minimal asymmetry of strength.  Pt will continue to benefit from skilled PT with focus on strength, safe functional mobility and gait training.    Follow Up Recommendations Outpatient PT;Supervision for mobility/OOB    Equipment Recommendations  Rolling walker with 5" wheels    Recommendations for Other Services       Precautions / Restrictions Precautions Precautions: Fall Restrictions Weight Bearing Restrictions: No      Mobility  Bed Mobility Overal bed mobility: Needs Assistance Bed Mobility: Supine to Sit;Sit to Supine     Supine to sit: Supervision Sit to supine: Supervision   General bed mobility comments: Increased time to sit upright.  Transfers Overall transfer level: Needs assistance Equipment used:  None Transfers: Sit to/from Stand Sit to Stand: Min guard         General transfer comment: Able to stand from ED bed with UE assistance and stand with fair stability.  PT very close throughout pt standing activity.  Pt more stable with RW.  Ambulation/Gait Ambulation/Gait assistance: Min guard Gait Distance (Feet): 30 Feet Assistive device: Rolling walker (2 wheeled)     Gait velocity interpretation: <1.8 ft/sec, indicate of risk for recurrent falls General Gait Details: Slow gait with wt shift to R side, knee instability on L side with WB and reliance on RW.  Pt able to slowly sequence turn to L side with VC's using RW.  Stairs            Wheelchair Mobility    Modified Rankin (Stroke Patients Only)       Balance Overall balance assessment: Needs assistance Sitting-balance support: No upper extremity supported;Feet supported Sitting balance-Leahy Scale: Good     Standing balance support: Single extremity supported;Bilateral upper extremity supported;During functional activity Standing balance-Leahy Scale: Fair Standing balance comment: Heavily reliant on RW at this time due to fear of knee "buckling".  Able to reach outside BOS while standing statically.                             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     Hand Dominance   Dominant Hand: Right    Extremity/Trunk Assessment   Upper Extremity Assessment Upper Extremity Assessment: Defer to OT evaluation LUE Deficits / Details: shoulder 4-/5  (breakthrough?), elbow flex/ext at least 3+/5, grip 4/5; denies sensory deficits, very mild coordination deficits with thumb opposition testing, encouragement to use LUE during tasks LUE Sensation: WNL LUE Coordination: decreased fine motor    Lower Extremity Assessment Lower Extremity Assessment: (MMT: R ankle DF/PF: 4/5, Knee flexion: 4/5, knee extension: 4+/5, hip flexion: 4/5.) LLE Deficits / Details: MMT: L ankle DF/PF: 4/5, Knee flexion: 4/5, knee extension: 4+/5, hip flexion: 4/5. LLE Sensation: decreased light touch LLE Coordination: decreased fine motor    Cervical / Trunk Assessment Cervical / Trunk Assessment: Normal  Communication   Communication: No difficulties  Cognition Arousal/Alertness: Awake/alert Behavior During Therapy: WFL for tasks assessed/performed Overall Cognitive Status: Within Functional Limits for tasks assessed                                 General Comments: Pt reports mentally"psyching myself out" and fearful of falling      General Comments      Exercises Other Exercises Other Exercises: RW training x5 min Other Exercises: Discussed stair negotiation with L side weakness x3 min   Assessment/Plan    PT Assessment Patient needs continued PT services  PT Problem List Decreased strength;Decreased mobility;Decreased activity tolerance;Decreased balance;Decreased knowledge of use of DME       PT Treatment Interventions DME instruction;Therapeutic exercise;Functional mobility training;Therapeutic activities;Patient/family education;Neuromuscular re-education;Balance training;Gait training    PT Goals (Current goals can be found in the Care Plan section)  Acute Rehab PT Goals Patient Stated Goal: To return to generally active lifestyle. PT Goal Formulation: With patient Time For Goal Achievement: 02/27/19 Potential to Achieve Goals: Good    Frequency Min 2X/week   Barriers to discharge        Co-evaluation                AM-PAC PT "6 Clicks" Mobility  Outcome Measure Help needed turning from your back to your side while in a flat bed without using bedrails?: A Little Help needed moving from lying on your back to sitting on the side of a flat bed without using bedrails?: A Little Help needed moving to and from a bed to a chair (including a wheelchair)?: A Little Help needed standing up from a chair using your arms (e.g., wheelchair or bedside chair)?: A Little Help needed to walk in hospital room?: A Little Help needed climbing 3-5 steps with a railing? : A Lot 6 Click Score: 17    End of Session Equipment Utilized During Treatment: Gait belt Activity Tolerance: Patient limited by fatigue Patient left: in bed        Time: 1443-1500 PT Time Calculation (min) (ACUTE ONLY): 17 min   Charges:   PT Evaluation $PT Eval Low Complexity: 1 Low PT Treatments $Therapeutic Activity: 8-22 mins       Roxanne Gates, PT, DPT   Roxanne Gates 02/13/2019, 4:52 PM

## 2019-02-13 NOTE — Progress Notes (Signed)
Ashley [name] at [place] NAME: Ashley [name]    MR#:  [id number]  DATE OF BIRTH:  1965/06/07  SUBJECTIVE:   Patient came in with left-sided facial numbness and extended to the left arm and leg. Feels little better. When she came to the ED her blood pressure was very high. Not much improved. Denies any headache or double vision REVIEW OF SYSTEMS:   Review of Systems  Constitutional: Negative for chills, fever and weight loss.  HENT: Negative for ear discharge, ear pain and nosebleeds.   Eyes: Negative for blurred vision, pain and discharge.  Respiratory: Negative for sputum production, shortness of breath, wheezing and stridor.   Cardiovascular: Negative for chest pain, palpitations, orthopnea and PND.  Gastrointestinal: Negative for abdominal pain, diarrhea, nausea and vomiting.  Genitourinary: Negative for frequency and urgency.  Musculoskeletal: Negative for back pain and joint pain.  Neurological: Positive for weakness. Negative for sensory change, speech change and focal weakness.  Psychiatric/Behavioral: Negative for depression and hallucinations. The patient is not nervous/anxious.    Tolerating Diet:yes Tolerating PT: pending  DRUG ALLERGIES:  No Known Allergies  VITALS:  Blood pressure 115/79, pulse 89, temperature 97.8 F (36.6 C), temperature source Oral, resp. rate 15, height 5\' 6"  (1.676 m), weight 74.8 kg, SpO2 100 %.  PHYSICAL EXAMINATION:   Physical Exam  GENERAL:  53 y.o.-year-old patient lying in the bed with no acute distress.  EYES: Pupils equal, round, reactive to light and accommodation. No scleral icterus. Extraocular muscles intact.  HEENT: Head atraumatic, normocephalic. Oropharynx and nasopharynx clear.  NECK:  Supple, no jugular venous distention. No thyroid enlargement, no tenderness.  LUNGS: Normal breath sounds bilaterally, no wheezing, rales, rhonchi. No use of accessory muscles of respiration.   CARDIOVASCULAR: S1, S2 normal. No murmurs, rubs, or gallops.  ABDOMEN: Soft, nontender, nondistended. Bowel sounds present. No organomegaly or mass.  EXTREMITIES: No cyanosis, clubbing or edema b/l.    NEUROLOGIC: Cranial nerves II through XII are intact. No focal Motor or sensory deficits b/l.   PSYCHIATRIC:  patient is alert and oriented x 3.  SKIN: No obvious rash, lesion, or ulcer.   LABORATORY PANEL:  CBC Recent Labs  Lab 02/12/19 2120  WBC 5.2  HGB 13.8  HCT 40.4  PLT 270    Chemistries  Recent Labs  Lab 02/12/19 2120  NA 138  K 3.9  CL 101  CO2 26  GLUCOSE 120*  BUN 12  CREATININE 0.70  CALCIUM 10.3  AST 30  ALT 23  ALKPHOS 92  BILITOT 0.9   Cardiac Enzymes No results for input(s): TROPONINI in the last 168 hours. RADIOLOGY:  CT Angio Head W or Wo Contrast  Result Date: 02/13/2019 CLINICAL DATA:  Follow-up examination for acute stroke. Left-sided facial numbness. EXAM: CT ANGIOGRAPHY HEAD AND NECK TECHNIQUE: Multidetector CT imaging of the head and neck was performed using the standard protocol during bolus administration of intravenous contrast. Multiplanar CT image reconstructions and MIPs were obtained to evaluate the vascular anatomy. Carotid stenosis measurements (when applicable) are obtained utilizing NASCET criteria, using the distal internal carotid diameter as the denominator. CONTRAST:  58mL OMNIPAQUE IOHEXOL 350 MG/ML SOLN COMPARISON:  Prior head CT from earlier the same day. FINDINGS: CTA NECK FINDINGS Aortic arch: Visualized aortic arch of normal caliber with normal 3 vessel morphology. Mild scattered atheromatous plaque about the arch itself. No hemodynamically significant stenosis seen about the origin of the great vessels. Visualized subclavian arteries widely patent. Right  carotid system: Right common carotid artery widely patent from its origin to the bifurcation without stenosis. Mild eccentric soft plaque at the right bifurcation without  hemodynamically significant stenosis. Right ICA widely patent distally to the skull base without stenosis, dissection or occlusion. Left carotid system: Left CCA widely patent from its origin to the bifurcation without stenosis. Mild eccentric soft plaque at the left bifurcation without hemodynamically significant stenosis. Left ICA widely patent distally to the skull base without stenosis, dissection or occlusion. Vertebral arteries: Both vertebral arteries arise from the subclavian arteries. Dominant right vertebral artery widely patent within the neck. Focal plaque at the origin of the left vertebral artery without hemodynamically significant stenosis. Left vertebral artery otherwise widely patent without stenosis or other abnormality. Skeleton: No acute osseous abnormality. No discrete lytic or blastic osseous lesions. Other neck: No other acute soft tissue abnormality within the neck. Few subcentimeter hypodense nodules noted within the thyroid, which do not require follow-up imaging given size and patient age. No other mass lesion or adenopathy. Upper chest: Visualized upper chest demonstrates no acute finding. Partially visualized lungs are grossly clear. Review of the MIP images confirms the above findings CTA HEAD FINDINGS Anterior circulation: Petrous, cavernous, and supraclinoid ICAs widely patent without flow-limiting stenosis. A1 segments widely patent. Normal anterior communicating artery complex. Anterior cerebral arteries widely patent to their distal aspects. No M1 stenosis or occlusion. Normal MCA bifurcations. Distal MCA branches well perfused and symmetric. Posterior circulation: Both vertebral arteries widely patent to the vertebrobasilar junction. Posterior inferior cerebral arteries patent bilaterally. Basilar widely patent to its distal aspect. Superior cerebral arteries and posterior cerebral arteries well perfused and widely patent. Venous sinuses: Grossly patent allowing for timing of the  contrast bolus. Anatomic variants: None significant.  No intracranial aneurysm. Review of the MIP images confirms the above findings IMPRESSION: 1. Negative CTA of the head and neck. No large vessel occlusion or other acute vascular abnormality. 2. Mild atheromatous change about the carotid bifurcations without significant stenosis. No hemodynamically significant or correctable stenosis seen about the major arterial vasculature of the head and neck. Electronically Signed   By: Jeannine Boga M.D.   On: 02/13/2019 00:24   CT Angio Neck W and/or Wo Contrast  Result Date: 02/13/2019 CLINICAL DATA:  Follow-up examination for acute stroke. Left-sided facial numbness. EXAM: CT ANGIOGRAPHY HEAD AND NECK TECHNIQUE: Multidetector CT imaging of the head and neck was performed using the standard protocol during bolus administration of intravenous contrast. Multiplanar CT image reconstructions and MIPs were obtained to evaluate the vascular anatomy. Carotid stenosis measurements (when applicable) are obtained utilizing NASCET criteria, using the distal internal carotid diameter as the denominator. CONTRAST:  73mL OMNIPAQUE IOHEXOL 350 MG/ML SOLN COMPARISON:  Prior head CT from earlier the same day. FINDINGS: CTA NECK FINDINGS Aortic arch: Visualized aortic arch of normal caliber with normal 3 vessel morphology. Mild scattered atheromatous plaque about the arch itself. No hemodynamically significant stenosis seen about the origin of the great vessels. Visualized subclavian arteries widely patent. Right carotid system: Right common carotid artery widely patent from its origin to the bifurcation without stenosis. Mild eccentric soft plaque at the right bifurcation without hemodynamically significant stenosis. Right ICA widely patent distally to the skull base without stenosis, dissection or occlusion. Left carotid system: Left CCA widely patent from its origin to the bifurcation without stenosis. Mild eccentric soft  plaque at the left bifurcation without hemodynamically significant stenosis. Left ICA widely patent distally to the skull base without stenosis, dissection or  occlusion. Vertebral arteries: Both vertebral arteries arise from the subclavian arteries. Dominant right vertebral artery widely patent within the neck. Focal plaque at the origin of the left vertebral artery without hemodynamically significant stenosis. Left vertebral artery otherwise widely patent without stenosis or other abnormality. Skeleton: No acute osseous abnormality. No discrete lytic or blastic osseous lesions. Other neck: No other acute soft tissue abnormality within the neck. Few subcentimeter hypodense nodules noted within the thyroid, which do not require follow-up imaging given size and patient age. No other mass lesion or adenopathy. Upper chest: Visualized upper chest demonstrates no acute finding. Partially visualized lungs are grossly clear. Review of the MIP images confirms the above findings CTA HEAD FINDINGS Anterior circulation: Petrous, cavernous, and supraclinoid ICAs widely patent without flow-limiting stenosis. A1 segments widely patent. Normal anterior communicating artery complex. Anterior cerebral arteries widely patent to their distal aspects. No M1 stenosis or occlusion. Normal MCA bifurcations. Distal MCA branches well perfused and symmetric. Posterior circulation: Both vertebral arteries widely patent to the vertebrobasilar junction. Posterior inferior cerebral arteries patent bilaterally. Basilar widely patent to its distal aspect. Superior cerebral arteries and posterior cerebral arteries well perfused and widely patent. Venous sinuses: Grossly patent allowing for timing of the contrast bolus. Anatomic variants: None significant.  No intracranial aneurysm. Review of the MIP images confirms the above findings IMPRESSION: 1. Negative CTA of the head and neck. No large vessel occlusion or other acute vascular abnormality. 2.  Mild atheromatous change about the carotid bifurcations without significant stenosis. No hemodynamically significant or correctable stenosis seen about the major arterial vasculature of the head and neck. Electronically Signed   By: Jeannine Boga M.D.   On: 02/13/2019 00:24   MR BRAIN WO CONTRAST  Result Date: 02/13/2019 CLINICAL DATA:  Initial evaluation for acute left-sided weakness. EXAM: MRI HEAD WITHOUT CONTRAST TECHNIQUE: Multiplanar, multiecho pulse sequences of the brain and surrounding structures were obtained without intravenous contrast. COMPARISON:  Prior CT and CTA from 02/12/2019. FINDINGS: Brain: Examination technically limited by motion artifact. Additionally, mild localized susceptibility artifact present at the left frontal calvarium. Cerebral volume within normal limits for patient age. No focal parenchymal signal abnormality identified. No abnormal foci of restricted diffusion to suggest acute or subacute ischemia. Gray-white matter differentiation well maintained. No encephalomalacia to suggest chronic infarction. No foci of susceptibility artifact to suggest acute or chronic intracranial hemorrhage. No mass lesion, midline shift or mass effect. No hydrocephalus. No extra-axial fluid collection. Major dural sinuses are grossly patent. Pituitary gland and suprasellar region are normal. Midline structures intact and normal. Vascular: Major intracranial vascular flow voids well maintained and normal in appearance. Skull and upper cervical spine: Craniocervical junction normal. Visualized upper cervical spine within normal limits. Bone marrow signal intensity normal. No scalp soft tissue abnormality. Sinuses/Orbits: Globes and orbital soft tissues within normal limits. Paranasal sinuses are clear. No mastoid effusion. Inner ear structures normal. Other: None. IMPRESSION: Negative brain MRI. No acute intracranial infarct or other abnormality identified. Electronically Signed   By:  Jeannine Boga M.D.   On: 02/13/2019 04:17   ECHOCARDIOGRAM COMPLETE  Result Date: 02/13/2019   ECHOCARDIOGRAM REPORT   Patient Name:   [facility] Date of Exam: 02/13/2019 Medical Rec #:  [id number]             Height:       66.0 in Accession #:    [id number]            Weight:       165.0  lb Date of Birth:  December 15, 1965            BSA:          1.84 m Patient Age:    53 years              BP:           124/81 mmHg Patient Gender: F                     HR:           71 bpm. Exam Location:  [facility] Procedure: 2D Echo Indications:     TIA 435.9/G45.9  History:         Patient has no prior history of Echocardiogram examinations.                  Risk Factors:Dyslipidemia.  Sonographer:     Avanell Shackleton Referring Phys:  [id number] [place] Diagnosing Phys: Skeet Latch MD IMPRESSIONS  1. Left ventricular ejection fraction, by visual estimation, is 60 to 65%. The left ventricle has normal function. Left ventricular septal wall thickness was normal. Normal left ventricular posterior wall thickness. There is no left ventricular hypertrophy.  2. The left ventricle has no regional wall motion abnormalities.  3. Global right ventricle has normal systolic function.The right ventricular size is normal. No increase in right ventricular wall thickness.  4. Left atrial size was normal.  5. Right atrial size was normal.  6. The mitral valve is normal in structure. No evidence of mitral valve regurgitation. No evidence of mitral stenosis.  7. The tricuspid valve is normal in structure. Tricuspid valve regurgitation is mild.  8. The aortic valve is tricuspid. Aortic valve regurgitation is not visualized. No evidence of aortic valve sclerosis or stenosis.  9. The pulmonic valve was normal in structure. Pulmonic valve regurgitation is trivial. 10. Normal pulmonary artery systolic pressure. 11. The inferior vena cava is normal in size with greater than 50% respiratory variability, suggesting right atrial pressure  of 3 mmHg. FINDINGS  Left Ventricle: Left ventricular ejection fraction, by visual estimation, is 60 to 65%. The left ventricle has normal function. The left ventricle has no regional wall motion abnormalities. Normal left ventricular posterior wall thickness. There is no left ventricular hypertrophy. Left ventricular diastolic parameters were normal. Normal left atrial pressure. Right Ventricle: The right ventricular size is normal. No increase in right ventricular wall thickness. Global RV systolic function is has normal systolic function. The tricuspid regurgitant velocity is 2.02 m/s, and with an assumed right atrial pressure  of 3 mmHg, the estimated right ventricular systolic pressure is normal at 19.2 mmHg. Left Atrium: Left atrial size was normal in size. Right Atrium: Right atrial size was normal in size Pericardium: There is no evidence of pericardial effusion. Mitral Valve: The mitral valve is normal in structure. No evidence of mitral valve regurgitation. No evidence of mitral valve stenosis by observation. Tricuspid Valve: The tricuspid valve is normal in structure. Tricuspid valve regurgitation is mild. Aortic Valve: The aortic valve is tricuspid. Aortic valve regurgitation is not visualized. The aortic valve is structurally normal, with no evidence of sclerosis or stenosis. Pulmonic Valve: The pulmonic valve was normal in structure. Pulmonic valve regurgitation is trivial. Pulmonic regurgitation is trivial. Aorta: The aortic root, ascending aorta and aortic arch are all structurally normal, with no evidence of dilitation or obstruction. Venous: The inferior vena cava is normal in size with greater than 50% respiratory variability, suggesting  right atrial pressure of 3 mmHg. IAS/Shunts: No atrial level shunt detected by color flow Doppler. There is no evidence of a patent foramen ovale. No ventricular septal defect is seen or detected. There is no evidence of an atrial septal defect.  LEFT VENTRICLE  PLAX 2D LVIDd:         4.33 cm  Diastology LVIDs:         2.74 cm  LV e' lateral:   10.40 cm/s LV PW:         0.95 cm  LV E/e' lateral: 9.1 LV IVS:        0.99 cm  LV e' medial:    7.07 cm/s LVOT diam:     2.20 cm  LV E/e' medial:  13.5 LV SV:         56 ml LV SV Index:   29.98 LVOT Area:     3.80 cm  RIGHT VENTRICLE             IVC RV S prime:     15.30 cm/s  IVC diam: 1.00 cm LEFT ATRIUM           Index       RIGHT ATRIUM           Index LA diam:      3.70 cm 2.01 cm/m  RA Area:     12.60 cm LA Vol (A4C): 40.4 ml 21.92 ml/m RA Volume:   25.30 ml  13.73 ml/m   AORTA Ao Root diam: 3.20 cm MITRAL VALVE                        TRICUSPID VALVE MV Area (PHT): 3.60 cm             TR Peak grad:   16.2 mmHg MV PHT:        61.19 msec           TR Vmax:        233.00 cm/s MV Decel Time: 211 msec MV E velocity: 95.10 cm/s 103 cm/s  SHUNTS MV A velocity: 78.60 cm/s 70.3 cm/s Systemic Diam: 2.20 cm MV E/A ratio:  1.21       1.5  Skeet Latch MD Electronically signed by Skeet Latch MD Signature Date/Time: 02/13/2019/2:36:56 PM    Final    CT HEAD CODE STROKE WO CONTRAST  Result Date: 02/12/2019 CLINICAL DATA:  Code stroke. Left facial and arm numbness. Hypertension. EXAM: CT HEAD WITHOUT CONTRAST TECHNIQUE: Contiguous axial images were obtained from the base of the skull through the vertex without intravenous contrast. COMPARISON:  Head CT 03/21/2008 FINDINGS: Brain: There is no evidence of acute infarct, intracranial hemorrhage, mass, midline shift, or extra-axial fluid collection. The ventricles and sulci are normal. Vascular: No hyperdense vessel. Skull: No fracture or focal osseous lesion. Sinuses/Orbits: Visualized paranasal sinuses and mastoid air cells are clear. Visualized orbits are unremarkable. Other: None. ASPECTS [facility] Stroke Program Early CT Score) - Ganglionic level infarction (caudate, lentiform nuclei, internal capsule, insula, M1-M3 cortex): 7 - Supraganglionic infarction (M4-M6 cortex): 3  Total score (0-10 with 10 being normal): 10 IMPRESSION: 1. Negative head CT. 2. ASPECTS is 10. These results were called by telephone at the time of interpretation on 02/12/2019 at 9:25 pm to Dr. Nance Pear , who verbally acknowledged these results. Electronically Signed   By: Logan Bores M.D.   On: 02/12/2019 21:32   ASSESSMENT AND PLAN:  Chesa Muldrow  is a 53  y.o. African-American female with a known history of GERD, dyslipidemia and hypothyroidism, who presented to the emergency room with a consult of left-sided weakness that started around 8 PM in the left side of her face and extended to her left arm then let her left leg.  She admits to mild associated paresthesias at the time.     1.  Left-sided weakness concerning for TIA, ruled out  CVA. - We will follow neuro checks q.4 hours for 24 hours.  - on aspirin and plavix - MRI Brain negative -Appreciate Dr. Doy Mince input-- recommends aspirin Plavix for three weeks and thereafter 81 mg aspirin daily -2D echo pending -CTA neck negative  2.  Dyslipidemia.   - on statin therapy.  3.  Hypertensive urgency.  She will be placed on as needed IV labetalol with permissive parameters. -no h/o HTN per pt but there are previous readings in chart (outpt) that were elevated -so far BP stable--if goes up will start po antiHTNsives  4.  GERD.  PPI therapy will be resumed.  5.  DVT prophylaxis.  Subcut Lovenox  Procedures:none Family communication :pt Consults :Neurology Discharge Disposition : home CODE STATUS: full DVT Prophylaxis :Lovenox  TOTAL TIME TAKING CARE OF THIS PATIENT: *30* minutes.  >50% time spent on counselling and coordination of care  POSSIBLE D/C IN *1-2* DAYS, DEPENDING ON CLINICAL CONDITION.  Note: This dictation was prepared with Dragon dictation along with smaller phrase technology. Any transcriptional errors that result from this process are unintentional.  Fritzi Mandes M.D on 02/13/2019 at 2:49  PM  Between 7am to 6pm - Pager - [phone]  After 6pm go to [email or web address]  [facility]   CC: Primary care physician; Pleas Koch, NPPatient ID: Simon Rhein, female   DOB: 02-20-1966, 53 y.o.   MRN: [id number]

## 2019-02-13 NOTE — ED Notes (Signed)
Pt otf for imaging 

## 2019-02-13 NOTE — ED Notes (Signed)
Floor RN states already received report on pt by [facility].

## 2019-02-13 NOTE — ED Notes (Signed)
Verbal for heart healthy diet given by Dr. Sidney Ace

## 2019-02-13 NOTE — ED Notes (Signed)
Report given to Katie, RN.

## 2019-02-13 NOTE — Progress Notes (Signed)
Consult received for Cognitive eval. Chart reviewed. MRI negative for acute infarct. No swallowing or speech problems identified in chart. Will visit Pt and screen for any speech or cognitive deficits once she moves to the floor. Please consult for swallow eval if any concerns of dysphagia

## 2019-02-13 NOTE — ED Notes (Signed)
Report received from Katy, RN.

## 2019-02-13 NOTE — Evaluation (Signed)
Occupational Therapy Evaluation Patient Details Name: Ashley [name] MRN: [id number] DOB: [birth year] Today's Date: 02/13/2019    History of Present Illness 53 y.o. female with a history of HLD who reports that at 2000 on last evening she had acute onset of left sided numbness and weakness including the face, arm and leg.  Patient presented for evaluation at that time.  BP elevated.  Initial NIHSS of 2. Patient reported symptoms have improved today with some LLE numbness still remaining.  She reports symptoms began to improve after about 2-3 hours. MRI negative for acute infarct.   Clinical Impression   Pt seen for OT evaluation this date. 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 Pt lives with her 3 adult sons in a 2 story home with 1 step to enter and R hand rail for full flight of stairs to her bedroom and full bath. Pt verbalizes plan to sleep on couch if needed for a while. Currently pt demonstrates mild impairments in LUE/LLE strength, coordination, and sensation (LLE only). Pt fearful of falling. Pt reports 1 week of intermittent blurry vision but denies deficits at time of assessment. Pt currently requires CGA and additional time/effort to perform LB ADL tasks. Encouragement/cues to incorporate LUE into tasks. Pt able to transfer to [facility] with CGA and performed hygiene with set up while seated. CGA to stand and for donning pants over hips after toileting. Pt instructed in [facility] for LUE. Pt verbalizes understanding. Pt is eager to return to his PLOF with increased independence and functional use of her L arm and leg. No cognitive or visual deficits noted with assessment on this date. Pt would benefit from skilled OT to address noted impairments and functional limitations (see below for any additional details) in order to maximize safety and independence while minimizing falls risk and  caregiver burden.  Upon hospital discharge, recommend pt continue therapy with outpatient OT to maximize safety and return to PLOF.    Follow Up Recommendations  Outpatient OT    Equipment Recommendations  None recommended by OT    Recommendations for Other Services       Precautions / Restrictions Precautions Precautions: Fall Restrictions Weight Bearing Restrictions: No      Mobility Bed Mobility Overal bed mobility: Needs Assistance Bed Mobility: Supine to Sit;Sit to Supine     Supine to sit: Supervision Sit to supine: Supervision   General bed mobility comments: pt tends to favor L side during movements  Transfers Overall transfer level: Needs assistance Equipment used: 1 person hand held assist Transfers: Sit to/from Stand Sit to Stand: Min guard         General transfer comment: Pt endorses decreased confidence with mobility 2/2 LLE sensory deficits and feeling "weak" on L side, encouragement provided    Balance Overall balance assessment: Needs assistance Sitting-balance support: No upper extremity supported;Feet supported Sitting balance-Leahy Scale: Good     Standing balance support: Single extremity supported;Bilateral upper extremity supported;During functional activity Standing balance-Leahy Scale: Fair Standing balance comment: when asked to march in place, pt used heavy BUE support through therapist's hand to improve stability                           ADL either performed or assessed with clinical judgement   ADL Overall ADL's : Needs assistance/impaired  General ADL Comments: Pt requires CGA for sit to stand ADL tasks, difficulty with donning shoes and tying laces but able to perform seated EOB with SBA, LUE fatigues during UE ADL tasks     Vision Baseline Vision/History: Wears glasses Wears Glasses: Distance only Patient Visual Report: (pt reports intermittent blurry vision,  no deficits at time of evaluation) Vision Assessment?: No apparent visual deficits     Perception     Praxis      Pertinent Vitals/Pain Pain Assessment: No/denies pain     Hand Dominance Right   Extremity/Trunk Assessment Upper Extremity Assessment Upper Extremity Assessment: LUE deficits/detail(RUE WFL) LUE Deficits / Details: shoulder 4-/5 (breakthrough?), elbow flex/ext at least 3+/5, grip 4/5; denies sensory deficits, very mild coordination deficits with thumb opposition testing, encouragement to use LUE during tasks LUE Sensation: WNL LUE Coordination: decreased fine motor   Lower Extremity Assessment Lower Extremity Assessment: LLE deficits/detail;Defer to PT evaluation(RLE WFL) LLE Deficits / Details: grossly at least 4/5, encouragement to improve resistance during MMT, reports "dullness" and intermittent "pins and needles", mild instability with weightbearing through LLE LLE Sensation: decreased light touch LLE Coordination: decreased fine motor   Cervical / Trunk Assessment Cervical / Trunk Assessment: Normal   Communication Communication Communication: No difficulties   Cognition Arousal/Alertness: Awake/alert Behavior During Therapy: WFL for tasks assessed/performed Overall Cognitive Status: Within Functional Limits for tasks assessed                                 General Comments: Pt reports mentally"psyching myself out" and fearful of falling   General Comments       Exercises Other Exercises Other Exercises: Pt instructed in [facility] and LUE ex to perform to improve functional use and address deficits; pt verbalized understanding   Shoulder Instructions      Home Living Family/patient expects to be discharged to:: Private residence Living Arrangements: Children(3 adult sons) Available Help at Discharge: Family;Available 24 hours/day Type of Home: House       Home Layout: Two level;1/2 bath on main level;Bed/bath upstairs Alternate Level  Stairs-Number of Steps: full flight with R handrail Alternate Level Stairs-Rails: Right Bathroom Shower/Tub: Teacher, early years/pre: Standard     Home Equipment: None          Prior Functioning/Environment Level of Independence: Independent        Comments: Pt indep, working as a Land, no falls        OT Problem List: Decreased strength;Decreased coordination;Impaired sensation;Impaired balance (sitting and/or standing);Decreased knowledge of use of DME or AE;Impaired UE functional use      OT Treatment/Interventions: Self-care/ADL training;Therapeutic exercise;Therapeutic activities;Neuromuscular education;DME and/or AE instruction;Patient/family education;Balance training    OT Goals(Current goals can be found in the care plan section) Acute Rehab OT Goals Patient Stated Goal: get better and return to PLOF OT Goal Formulation: With patient Time For Goal Achievement: 02/27/19 Potential to Achieve Goals: Good ADL Goals Pt Will Perform Lower Body Dressing: with modified independence;sit to/from stand Pt Will Transfer to Toilet: with modified independence;ambulating;regular height toilet(LRAD for amb) Pt/caregiver will Perform Home Exercise Program: Increased strength;Left upper extremity;With written HEP provided([facility])  OT Frequency: Min 1X/week   Barriers to D/C:            Co-evaluation              AM-PAC OT "6 Clicks" Daily Activity     Outcome Measure  Help from another person eating meals?: None Help from another person taking care of personal grooming?: None Help from another person toileting, which includes using toliet, bedpan, or urinal?: A Little Help from another person bathing (including washing, rinsing, drying)?: A Little Help from another person to put on and taking off regular upper body clothing?: None Help from another person to put on and taking off regular lower body clothing?: A Little 6 Click Score: 21   End of  Session Equipment Utilized During Treatment: Gait belt  Activity Tolerance: Patient tolerated treatment well Patient left: in bed;with call bell/phone within reach  OT Visit Diagnosis: Other abnormalities of gait and mobility (R26.89);Hemiplegia and hemiparesis Hemiplegia - Right/Left: Left Hemiplegia - dominant/non-dominant: Non-Dominant Hemiplegia - caused by: Unspecified                Time: [id number] OT Time Calculation (min): 34 min Charges:  OT General Charges $OT Visit: 1 Visit OT Evaluation $OT Eval Low Complexity: 1 Low OT Treatments $Self Care/Home Management : 8-22 mins $Neuromuscular Re-education: 8-22 mins  Jeni Salles, MPH, MS, OTR/L ascom [phone] 02/13/19, 3:07 PM

## 2019-02-13 NOTE — Progress Notes (Signed)
Report called to [facility] on 1C; will transfer patient.

## 2019-02-13 NOTE — H&P (Signed)
Ashley [name] at [place] NAME: Ashley [name]    MR#:  [id number]  DATE OF BIRTH:  1965/07/20  DATE OF ADMISSION:  02/12/2019  PRIMARY CARE PHYSICIAN: Pleas Koch, NP   REQUESTING/REFERRING PHYSICIAN: Nance Pear, MD  CHIEF COMPLAINT:   Chief Complaint  Patient presents with  . Hypertension  . Numbness  . Code Stroke  The patient was seen and examined at 02/12/2019  HISTORY OF PRESENT ILLNESS:  Ashley [name]  is a 53 y.o. African-American female with a known history of GERD, dyslipidemia and hypothyroidism, who presented to the emergency room with a consult of left-sided weakness that started around 8 PM in the left side of her face and extended to her left arm then let her left leg.  She admits to mild associated paresthesias at the time.  She denied any headache then but admits to mild headache during my interview.  She has been having tinnitus without vertigo.  No loss of bowel or bladder control.  No witnessed seizures.  She denied any fever or chills.  No chest pain or dyspnea or palpitations.  No recent COVID-19 exposure.  Upon presentation to the emergency room, temperature was 99.1 and later 98.6 blood pressure was 164/96 and later 203/114 with otherwise normal vital signs.  Labs revealed unremarkable CMP and CBC.  Urine pregnancy test was negative urine drug screen came back negative as well as urinalysis.  Head and neck CT angios revealed no large vessel occlusion or other acute vascular abnormality but showed mild atheromatous change about the carotid bifurcation without significant stenosis.  There was no hemodynamically significant or correctable stenosis in the major arterial vasculature of the head and neck.  The patient was given 10 mg of IV labetalol.  She will be admitted to an observation medical monitored bed for further evaluation and management   PAST MEDICAL HISTORY:   Past Medical History:  Diagnosis Date  . Anemia   .  Bronchitis    2 years ago   . Bent arthritis 2019   base of left thumb   . GERD (gastroesophageal reflux disease)    diet controlled , no meds  . Hydrosalpinx    bilateral   . Hyperlipidemia    recent dx - currently diet control - no meds  . Hypothyroid 2003   radio active iodine - no meds  . SVD (spontaneous vaginal delivery)    x 1  . Vitamin B 12 deficiency     PAST SURGICAL HISTORY:   Past Surgical History:  Procedure Laterality Date  . CESAREAN SECTION     x 2  . CHOLECYSTECTOMY  2008  . DILATION AND CURETTAGE OF UTERUS     X 2  . LAPAROSCOPIC BILATERAL SALPINGECTOMY Bilateral 07/29/2017   Procedure: LAPAROSCOPIC LEFT SALPINGOOPHRECTOMY: Partial right Salpingectomy;  Surgeon: Lavonia Drafts, MD;  Location: [facility];  Service: Gynecology;  Laterality: Bilateral;  . SHOULDER SURGERY  2016   arthroscopic  . TOTAL ABDOMINAL HYSTERECTOMY  2006   Fibroids, benign pathology; partial hysterectomy per patient   . TUBAL LIGATION    . WISDOM TOOTH EXTRACTION     at age 59 yr  . WRIST ARTHROSCOPY Right 2007    SOCIAL HISTORY:   Social History   Tobacco Use  . Smoking status: Never Smoker  . Smokeless tobacco: Never Used  Substance Use Topics  . Alcohol use: No    FAMILY HISTORY:   Family History  Problem Relation  Age of Onset  . Hypertension Mother   . Breast cancer Maternal Aunt   . Hypertension Maternal Grandmother   . Heart failure Maternal Grandmother   . Lung cancer Maternal Grandmother   . Colon cancer Neg Hx   . Colon polyps Neg Hx   . Rectal cancer Neg Hx   . Stomach cancer Neg Hx     DRUG ALLERGIES:  No Known Allergies  REVIEW OF SYSTEMS:   ROS As per history of present illness. All pertinent systems were reviewed above. Constitutional,  HEENT, cardiovascular, respiratory, GI, GU, musculoskeletal, neuro, psychiatric, endocrine,  integumentary and hematologic systems were reviewed and are otherwise    negative/unremarkable except for positive findings mentioned above in the HPI.   MEDICATIONS AT HOME:   Prior to Admission medications   Medication Sig Start Date End Date Taking? Authorizing Provider  cetirizine (ZYRTEC) 10 MG tablet Take 1 tablet (10 mg total) by mouth daily. For allergies. 06/16/18  Yes Pleas Koch, NP  cyclobenzaprine (FLEXERIL) 10 MG tablet Take 1 tablet (10 mg total) by mouth 3 (three) times daily as needed. 01/18/19  Yes Sable Feil, PA-C  fluticasone (FLONASE) 50 MCG/ACT nasal spray Place 2 sprays into both nostrils daily. 01/30/18  Yes Tower, Wynelle Fanny, MD  meloxicam (MOBIC) 15 MG tablet Take 1 tablet (15 mg total) by mouth daily as needed for pain. 01/16/19  Yes Cook, Jayce G, DO  pantoprazole (PROTONIX) 20 MG tablet Take 1 tablet by mouth once to twice daily for heartburn. 10/06/18  Yes Pleas Koch, NP      VITAL SIGNS:  Blood pressure (!) 164/107, pulse 86, temperature 98.6 F (37 C), temperature source Oral, resp. rate 15, height 5\' 6"  (1.676 m), weight 74.8 kg, SpO2 100 %.  PHYSICAL EXAMINATION:  Physical Exam  GENERAL:  53 y.o.-year-old African-American patient lying in the bed with no acute distress.  EYES: Pupils equal, round, reactive to light and accommodation. No scleral icterus. Extraocular muscles intact.  HEENT: Head atraumatic, normocephalic. Oropharynx and nasopharynx clear.  NECK:  Supple, no jugular venous distention. No thyroid enlargement, no tenderness.  LUNGS: Normal breath sounds bilaterally, no wheezing, rales,rhonchi or crepitation. No use of accessory muscles of respiration.  CARDIOVASCULAR: Regular rate and rhythm, S1, S2 normal. No murmurs, rubs, or gallops.  ABDOMEN: Soft, nondistended, nontender. Bowel sounds present. No organomegaly or mass.  EXTREMITIES: No pedal edema, cyanosis, or clubbing.  NEUROLOGIC: Cranial nerves II through XII are intact. Muscle strength 5/5 in all extremities. Sensation intact. Gait not  checked.  No dysdiadochokinesia. PSYCHIATRIC: The patient is alert and oriented x 3.  Normal affect and good eye contact. SKIN: No obvious rash, lesion, or ulcer.   LABORATORY PANEL:   CBC Recent Labs  Lab 02/12/19 2120  WBC 5.2  HGB 13.8  HCT 40.4  PLT 270   ------------------------------------------------------------------------------------------------------------------  Chemistries  Recent Labs  Lab 02/12/19 2120  NA 138  K 3.9  CL 101  CO2 26  GLUCOSE 120*  BUN 12  CREATININE 0.70  CALCIUM 10.3  AST 30  ALT 23  ALKPHOS 92  BILITOT 0.9   ------------------------------------------------------------------------------------------------------------------  Cardiac Enzymes No results for input(s): TROPONINI in the last 168 hours. ------------------------------------------------------------------------------------------------------------------  RADIOLOGY:  CT HEAD CODE STROKE WO CONTRAST  Result Date: 02/12/2019 CLINICAL DATA:  Code stroke. Left facial and arm numbness. Hypertension. EXAM: CT HEAD WITHOUT CONTRAST TECHNIQUE: Contiguous axial images were obtained from the base of the skull through the vertex without intravenous contrast.  COMPARISON:  Head CT 03/21/2008 FINDINGS: Brain: There is no evidence of acute infarct, intracranial hemorrhage, mass, midline shift, or extra-axial fluid collection. The ventricles and sulci are normal. Vascular: No hyperdense vessel. Skull: No fracture or focal osseous lesion. Sinuses/Orbits: Visualized paranasal sinuses and mastoid air cells are clear. Visualized orbits are unremarkable. Other: None. ASPECTS [facility] Stroke [facility] Early CT Score) - Ganglionic level infarction (caudate, lentiform nuclei, internal capsule, insula, M1-M3 cortex): 7 - Supraganglionic infarction (M4-M6 cortex): 3 Total score (0-10 with 10 being normal): 10 IMPRESSION: 1. Negative head CT. 2. ASPECTS is 10. These results were called by telephone at the time of  interpretation on 02/12/2019 at 9:25 pm to Dr. Nance Pear , who verbally acknowledged these results. Electronically Signed   By: Logan Bores M.D.   On: 02/12/2019 21:32      IMPRESSION AND PLAN:   1.  Left-sided weakness concerning for TIA, rule out evolving CVA. The patient will be admitted to an observation medically monitored bed.  We will follow neuro checks q.4 hours for 24 hours.  The patient will be placed on aspirin.  Will obtain a brain MRI without contrast as well as bilateral carotid Doppler and 2D echo with bubble study .  A neurology consultation (when available) as well as physical/occupation/speech therapy consults will be obtained in a.m..  I notified Dr. Doy Mince about the patient. The patient will be placed on statin therapy and fasting lipids will be checked.  2.  Dyslipidemia.  We will check fasting lipids and place the patient on statin therapy.  3.  Hypertensive urgency.  She will be placed on as needed IV labetalol with permissive parameters.  4.  GERD.  PPI therapy will be resumed.  5.  DVT prophylaxis.  Subtenons Lovenox  All the records are reviewed and case discussed with ED provider. The plan of care was discussed in details with the patient (and family). I answered all questions. The patient agreed to proceed with the above mentioned plan. Further management will depend upon hospital course.   CODE STATUS: Full code  TOTAL TIME TAKING CARE OF THIS PATIENT: 50 minutes.    Christel Mormon M.D on 02/13/2019 at 12:15 AM  [facility]   From 7 PM-7 AM, contact night-coverage [email or web address]  CC: Primary care physician; Pleas Koch, NP   Note: This dictation was prepared with Dragon dictation along with smaller phrase technology. Any transcriptional errors that result from this process are unintentional.

## 2019-02-13 NOTE — ED Provider Notes (Addendum)
[facility] Emergency Department Provider Note   ____________________________________________   I have reviewed the triage vital signs and the nursing notes.   HISTORY  Chief Complaint Left sided weakness  History limited by: Not Limited   HPI Ashley [name] is a 53 y.o. female who presents to the emergency department today because of concerns for left-sided weakness.  Patient states it started suddenly around 8:00.  She noticed that the weakness seemed to descend from her face to her left arm and then to her left leg.  This was accompanied by some numbness.  The patient has been having some neck pain recently although denies any trauma or manipulation.  Patient denies any recent fevers or illness.  By the time of my exam she states that she feels the strength has come back well in her arm and is coming back in her leg.   Records reviewed. Per medical record review patient has a history of HLD, GERD  Past Medical History:  Diagnosis Date  . Anemia   . Bronchitis    2 years ago   . [place] arthritis 2019   base of left thumb   . GERD (gastroesophageal reflux disease)    diet controlled , no meds  . Hydrosalpinx    bilateral   . Hyperlipidemia    recent dx - currently diet control - no meds  . Hypothyroid 2003   radio active iodine - no meds  . SVD (spontaneous vaginal delivery)    x 1  . Vitamin B 12 deficiency     Patient Active Problem List   Diagnosis Date Noted  . Left-sided weakness 02/12/2019  . 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 Numbness and tingling 02/01/2019  . Urinary frequency 11/30/2018  . Osteoarthritis 06/30/2018  . Vitamin D deficiency 06/23/2018  . Vitamin B 12 deficiency 06/23/2018  . Abdominal pain 06/23/2018  . GERD (gastroesophageal reflux disease) 01/30/2018  . Hyperlipidemia 03/19/2017  . Elevated blood pressure reading 03/19/2017  . Allergic rhinitis 03/19/2017  . Ovarian cyst, left 02/23/2016  . Pelvic pain  02/07/2016    Past Surgical History:  Procedure Laterality Date  . CESAREAN SECTION     x 2  . CHOLECYSTECTOMY  2008  . DILATION AND CURETTAGE OF UTERUS     X 2  . LAPAROSCOPIC BILATERAL SALPINGECTOMY Bilateral 07/29/2017   Procedure: LAPAROSCOPIC LEFT SALPINGOOPHRECTOMY: Partial right Salpingectomy;  Surgeon: Lavonia Drafts, MD;  Location: [place];  Service: Gynecology;  Laterality: Bilateral;  . SHOULDER SURGERY  2016   arthroscopic  . TOTAL ABDOMINAL HYSTERECTOMY  2006   Fibroids, benign pathology; partial hysterectomy per patient   . TUBAL LIGATION    . WISDOM TOOTH EXTRACTION     at age 53 yr  . WRIST ARTHROSCOPY Right 2007    Prior to Admission medications   Medication Sig Start Date End Date Taking? Authorizing Provider  cetirizine (ZYRTEC) 10 MG tablet Take 1 tablet (10 mg total) by mouth daily. For allergies. 06/16/18  Yes Pleas Koch, NP  cyclobenzaprine (FLEXERIL) 10 MG tablet Take 1 tablet (10 mg total) by mouth 3 (three) times daily as needed. 01/18/19  Yes Sable Feil, PA-C  fluticasone (FLONASE) 50 MCG/ACT nasal spray Place 2 sprays into both nostrils daily. 01/30/18  Yes Tower, Wynelle Fanny, MD  meloxicam (MOBIC) 15 MG tablet Take 1 tablet (15 mg total) by mouth daily as needed for pain. 01/16/19  Yes Cook, Jayce G, DO  pantoprazole (PROTONIX)  20 MG tablet Take 1 tablet by mouth once to twice daily for heartburn. 10/06/18  Yes Pleas Koch, NP    Allergies Patient has no known allergies.  Family History  Problem Relation Age of Onset  . Hypertension Mother   . Breast cancer Maternal Aunt   . Hypertension Maternal Grandmother   . Heart failure Maternal Grandmother   . Lung cancer Maternal Grandmother   . Colon cancer Neg Hx   . Colon polyps Neg Hx   . Rectal cancer Neg Hx   . Stomach cancer Neg Hx     Social History Social History   Tobacco Use  . Smoking status: Never Smoker  . Smokeless tobacco: Never Used   Substance Use Topics  . Alcohol use: No  . Drug use: No    Review of Systems Constitutional: No fever/chills Eyes: No visual changes. ENT: No sore throat. Cardiovascular: Denies chest pain. Respiratory: Denies shortness of breath. Gastrointestinal: No abdominal pain.  No nausea, no vomiting.  No diarrhea.   Genitourinary: Negative for dysuria. Musculoskeletal: Positive for neck pain. Skin: Negative for rash. Neurological: Left sided weakness.  ____________________________________________   PHYSICAL EXAM:  VITAL SIGNS: ED Triage Vitals [02/12/19 2056]  Enc Vitals Group     BP (!) 203/114     Pulse Rate (!) 106     Resp 16     Temp 98.6 F (37 C)     Temp Source Oral     SpO2 100 %     Weight 165 lb (74.8 kg)     Height 5\' 6"  (1.676 m)     Head Circumference      Peak Flow      Pain Score 0   Constitutional: Alert and oriented.  Eyes: Conjunctivae are normal.  ENT      Head: Normocephalic and atraumatic.      Nose: No congestion/rhinnorhea.      Mouth/Throat: Mucous membranes are moist.      Neck: No stridor. Hematological/Lymphatic/Immunilogical: No cervical lymphadenopathy. Cardiovascular: Normal rate, regular rhythm.  No murmurs, rubs, or gallops. Respiratory: Normal respiratory effort without tachypnea nor retractions. Breath sounds are clear and equal bilaterally. No wheezes/rales/rhonchi. Gastrointestinal: Soft and non tender. No rebound. No guarding.  Genitourinary: Deferred Musculoskeletal: Normal range of motion in all extremities. No lower extremity edema. Neurologic:  Face symmetric. EOMI. PERRL. No upper extremity pronator drift. Right lower leg strength 5/5. Left lower leg 4/5. Sensation intact.  Skin:  Skin is warm, dry and intact. No rash noted. Psychiatric: Mood and affect are normal. Speech and behavior are normal. Patient exhibits appropriate insight and judgment.  ____________________________________________    LABS (pertinent  positives/negatives)  Upreg neg UDS negative CMP wnl except glu 120, t pro 8.7 CBC wbc 5.2, hgb 13.8, plt 270  ____________________________________________   EKG:  Apolonio Schneiders, attending physician, personally viewed and interpreted this EKG  EKG Time: 2121 Rate: 99 Rhythm: sinus rhythm Axis: normal Intervals: qtc 487 QRS: narrow, q waves v1 ST changes: no st elevation Impression: abnormal ekg  ____________________________________________  RADIOLOGY  CT head No acute abnormality  ____________________________________________   PROCEDURES  Procedures  CRITICAL CARE Performed by: Nance Pear   Total critical care time: 30 minutes  Critical care time was exclusive of separately billable procedures and treating other patients.  Critical care was necessary to treat or prevent imminent or life-threatening deterioration.  Critical care was time spent personally by me on the following activities: development of treatment plan with  patient and/or surrogate as well as nursing, discussions with consultants, evaluation of patient's response to treatment, examination of patient, obtaining history from patient or surrogate, ordering and performing treatments and interventions, ordering and review of laboratory studies, ordering and review of radiographic studies, pulse oximetry and re-evaluation of patient's condition.  ____________________________________________   INITIAL IMPRESSION / ASSESSMENT AND PLAN / ED COURSE  Pertinent labs & imaging results that were available during my care of the patient were reviewed by me and considered in my medical decision making (see chart for details).   Patient presented to the emergency department today because of concerns for left-sided weakness that started at 8:00 this evening.  The time of my exam the patient has had significant improvement in her left upper extremity and improvement in her left lower extremity as well.   Patient was called a code stroke and teleneurology did evaluate.  They did recommend TPA.  However in my discussion with the patient and going over the risk and benefits patient chose to decline TPA.  Did discuss with patient that if we missed the window for TPA then she would no longer be eligible to receive it. Her blood pressure was elevated her and patient was given labetalol in the event she chose to get TPA.  I think this is very reasonable to decline at this time especially given the patient's improvement.  Discussed with patient that we will plan on admission for further work-up for what I think will likely turn out to be a TIA. ____________________________________________   FINAL CLINICAL IMPRESSION(S) / ED DIAGNOSES  Final diagnoses:  Left-sided weakness     Note: This dictation was prepared with Dragon dictation. Any transcriptional errors that result from this process are unintentional     Nance Pear, MD 02/13/19 [id number]    Nance Pear, MD 02/23/19 1520

## 2019-02-13 NOTE — Progress Notes (Signed)
Meal tray provided to patient.

## 2019-02-13 NOTE — Consult Note (Signed)
Referring Physician: Posey Pronto    Chief Complaint: Left sided numbness  HPI: Marcella Bravata is an 53 y.o. female with a history of HLD who reports that at 2000 on last evening she had acute onset of left sided numbness and weakness including the face, arm and leg.  Patient presented for evaluation at that time.  BP elevated.  Initial NIHSS of 2.  Patient reports symptoms have improved today with some LLE numbness still remaining.  She reports symptoms began to improve after about 2-3 hours.     Date last known well: Date: 02/12/2019 Time last known well: Time: 20:00 tPA Given: No: Patient refusal  Past Medical History:  Diagnosis Date  . Anemia   . Bronchitis    2 years ago   . [place] arthritis 2019   base of left thumb   . GERD (gastroesophageal reflux disease)    diet controlled , no meds  . Hydrosalpinx    bilateral   . Hyperlipidemia    recent dx - currently diet control - no meds  . Hypothyroid 2003   radio active iodine - no meds  . SVD (spontaneous vaginal delivery)    x 1  . Vitamin B 12 deficiency     Past Surgical History:  Procedure Laterality Date  . CESAREAN SECTION     x 2  . CHOLECYSTECTOMY  2008  . DILATION AND CURETTAGE OF UTERUS     X 2  . LAPAROSCOPIC BILATERAL SALPINGECTOMY Bilateral 07/29/2017   Procedure: LAPAROSCOPIC LEFT SALPINGOOPHRECTOMY: Partial right Salpingectomy;  Surgeon: Lavonia Drafts, MD;  Location: [facility];  Service: Gynecology;  Laterality: Bilateral;  . SHOULDER SURGERY  2016   arthroscopic  . TOTAL ABDOMINAL HYSTERECTOMY  2006   Fibroids, benign pathology; partial hysterectomy per patient   . TUBAL LIGATION    . WISDOM TOOTH EXTRACTION     at age 9 yr  . WRIST ARTHROSCOPY Right 2007    Family History  Problem Relation Age of Onset  . Hypertension Mother   . Breast cancer Maternal Aunt   . Hypertension Maternal Grandmother   . Heart failure Maternal Grandmother   . Lung cancer Maternal Grandmother    . Colon cancer Neg Hx   . Colon polyps Neg Hx   . Rectal cancer Neg Hx   . Stomach cancer Neg Hx    Social History:  reports that she has never smoked. She has never used smokeless tobacco. She reports that she does not drink alcohol or use drugs.  Allergies: No Known Allergies  Medications:  I have reviewed the patient's current medications. Prior to Admission:  Prior to Admission medications   Medication Sig Start Date End Date Taking? Authorizing Provider  cetirizine (ZYRTEC) 10 MG tablet Take 1 tablet (10 mg total) by mouth daily. For allergies. 06/16/18  Yes Pleas Koch, NP  cyclobenzaprine (FLEXERIL) 10 MG tablet Take 1 tablet (10 mg total) by mouth 3 (three) times daily as needed. 01/18/19  Yes Sable Feil, PA-C  fluticasone (FLONASE) 50 MCG/ACT nasal spray Place 2 sprays into both nostrils daily. 01/30/18  Yes Tower, Wynelle Fanny, MD  meloxicam (MOBIC) 15 MG tablet Take 1 tablet (15 mg total) by mouth daily as needed for pain. 01/16/19  Yes Cook, Jayce G, DO  pantoprazole (PROTONIX) 20 MG tablet Take 1 tablet by mouth once to twice daily for heartburn. 10/06/18  Yes Pleas Koch, NP    Scheduled: . alteplase      .  aspirin  81 mg Oral Daily  . enoxaparin (LOVENOX) injection  40 mg Subcutaneous Q24H  . fluticasone  2 spray Each Nare Daily  . loratadine  10 mg Oral Daily  . pantoprazole  40 mg Oral Daily  . simvastatin  20 mg Oral q1800    ROS: History obtained from the patient  General ROS: negative for - chills, fatigue, fever, night sweats, weight gain or weight loss Psychological ROS: negative for - behavioral disorder, hallucinations, memory difficulties, mood swings or suicidal ideation Ophthalmic ROS: negative for - blurry vision, double vision, eye pain or loss of vision ENT ROS: negative for - epistaxis, nasal discharge, oral lesions, sore throat, tinnitus or vertigo Allergy and Immunology ROS: negative for - hives or itchy/watery eyes Hematological  and Lymphatic ROS: negative for - bleeding problems, bruising or swollen lymph nodes Endocrine ROS: negative for - galactorrhea, hair pattern changes, polydipsia/polyuria or temperature intolerance Respiratory ROS: negative for - cough, hemoptysis, shortness of breath or wheezing Cardiovascular ROS: negative for - chest pain, dyspnea on exertion, edema or irregular heartbeat Gastrointestinal ROS: negative for - abdominal pain, diarrhea, hematemesis, nausea/vomiting or stool incontinence Genito-Urinary ROS: negative for - dysuria, hematuria, incontinence or urinary frequency/urgency Musculoskeletal ROS: negative for - joint swelling or muscular weakness Neurological ROS: as noted in HPI Dermatological ROS: negative for rash and skin lesion changes  Physical Examination: Blood pressure 115/79, pulse 89, temperature 97.8 F (36.6 C), temperature source Oral, resp. rate 15, height 5\' 6"  (1.676 m), weight 74.8 kg, SpO2 100 %.  HEENT-  Normocephalic, no lesions, without obvious abnormality.  Normal external eye and conjunctiva.  Normal TM's bilaterally.  Normal auditory canals and external ears. Normal external nose, mucus membranes and septum.  Normal pharynx. Cardiovascular- S1, S2 normal, pulses palpable throughout   Lungs- chest clear, no wheezing, rales, normal symmetric air entry Abdomen- soft, non-tender; bowel sounds normal; no masses,  no organomegaly Extremities- no edema Lymph-no adenopathy palpable Musculoskeletal-no joint tenderness, deformity or swelling Skin-warm and dry, no hyperpigmentation, vitiligo, or suspicious lesions  Neurological Examination   Mental Status: Alert, oriented, thought content appropriate.  Speech fluent without evidence of aphasia.  Able to follow 3 step commands without difficulty. Cranial Nerves: II: Discs flat bilaterally; Visual fields grossly normal, pupils equal, round, reactive to light and accommodation III,IV, VI: ptosis not present, extra-ocular  motions intact bilaterally V,VII: smile symmetric, facial light touch sensation normal bilaterally VIII: hearing normal bilaterally IX,X: gag reflex present XI: bilateral shoulder shrug XII: midline tongue extension Motor: Right : Upper extremity   5/5    Left:     Upper extremity   5/5  Lower extremity   5/5     Lower extremity   4-/5 with the patient providing little effort and no reciprocal downward movement of the RLE noted Tone and bulk:normal tone throughout; no atrophy noted Sensory: Pinprick and light touch decreased in the LLE Deep Tendon Reflexes: Symmetric throughout Plantars: Right: mute   Left: mute Cerebellar: Normal finger-to-nose and normal heel-to-shin testing bilaterally Gait: not tested due to safety concerns    Laboratory Studies:  Basic Metabolic Panel: Recent Labs  Lab 02/12/19 2120  NA 138  K 3.9  CL 101  CO2 26  GLUCOSE 120*  BUN 12  CREATININE 0.70  CALCIUM 10.3    Liver Function Tests: Recent Labs  Lab 02/12/19 2120  AST 30  ALT 23  ALKPHOS 92  BILITOT 0.9  PROT 8.7*  ALBUMIN 4.9   No results for  input(s): LIPASE, AMYLASE in the last 168 hours. No results for input(s): AMMONIA in the last 168 hours.  CBC: Recent Labs  Lab 02/12/19 2120  WBC 5.2  NEUTROABS 2.7  HGB 13.8  HCT 40.4  MCV 81.6  PLT 270    Cardiac Enzymes: No results for input(s): CKTOTAL, CKMB, CKMBINDEX, TROPONINI in the last 168 hours.  BNP: Invalid input(s): POCBNP  CBG: Recent Labs  Lab 02/12/19 2120  GLUCAP 98    Microbiology: Results for orders placed or performed during the hospital encounter of 10/29/17  Wet prep, genital     Status: Abnormal   Collection Time: 10/29/17  9:46 AM   Specimen: Vaginal  Result Value Ref Range Status   Yeast Wet Prep HPF POC NONE SEEN NONE SEEN Final   Trich, Wet Prep NONE SEEN NONE SEEN Final   Clue Cells Wet Prep HPF POC PRESENT (A) NONE SEEN Final   WBC, Wet Prep HPF POC FEW (A) NONE SEEN Final   Sperm NONE  SEEN  Final    Comment: Performed at [facility], [address] [place], [place] [address]    Coagulation Studies: Recent Labs    02/12/19 2118-05-22  LABPROT 11.5  INR 0.9    Urinalysis:  Recent Labs  Lab 02/12/19 2210  COLORURINE COLORLESS*  LABSPEC 1.004*  PHURINE 6.0  GLUCOSEU NEGATIVE  HGBUR NEGATIVE  BILIRUBINUR NEGATIVE  KETONESUR NEGATIVE  PROTEINUR NEGATIVE  NITRITE NEGATIVE  LEUKOCYTESUR NEGATIVE    Lipid Panel:    Component Value Date/Time   CHOL 221 (H) 02/13/2019 0603   CHOL 214 (H) 01/21/2017 1018   TRIG 45 02/13/2019 0603   HDL 77 02/13/2019 0603   HDL 73 01/21/2017 1018   CHOLHDL 2.9 02/13/2019 0603   VLDL 9 02/13/2019 0603   LDLCALC 135 (H) 02/13/2019 0603   LDLCALC 129 (H) 01/21/2017 1018    HgbA1C:  Lab Results  Component Value Date   HGBA1C 6.0 (H) 02/13/2019    Urine Drug Screen:      Component Value Date/Time   LABOPIA NONE DETECTED 02/12/2019 2201   COCAINSCRNUR NONE DETECTED 02/12/2019 2201   LABBENZ NONE DETECTED 02/12/2019 2201   AMPHETMU NONE DETECTED 02/12/2019 2201   THCU NONE DETECTED 02/12/2019 2201   LABBARB NONE DETECTED 02/12/2019 May 21, 2199    Alcohol Level:  Recent Labs  Lab 02/12/19 2120  ETH <10    Other results: EKG: sinus rhythm at 99 bpm.  Imaging: CT Angio Head W or Wo Contrast  Result Date: 02/13/2019 CLINICAL DATA:  Follow-up examination for acute stroke. Left-sided facial numbness. EXAM: CT ANGIOGRAPHY HEAD AND NECK TECHNIQUE: Multidetector CT imaging of the head and neck was performed using the standard protocol during bolus administration of intravenous contrast. Multiplanar CT image reconstructions and MIPs were obtained to evaluate the vascular anatomy. Carotid stenosis measurements (when applicable) are obtained utilizing NASCET criteria, using the distal internal carotid diameter as the denominator. CONTRAST:  71mL OMNIPAQUE IOHEXOL 350 MG/ML SOLN COMPARISON:  Prior head CT from earlier the  same day. FINDINGS: CTA NECK FINDINGS Aortic arch: Visualized aortic arch of normal caliber with normal 3 vessel morphology. Mild scattered atheromatous plaque about the arch itself. No hemodynamically significant stenosis seen about the origin of the great vessels. Visualized subclavian arteries widely patent. Right carotid system: Right common carotid artery widely patent from its origin to the bifurcation without stenosis. Mild eccentric soft plaque at the right bifurcation without hemodynamically significant stenosis. Right ICA widely patent distally to  the skull base without stenosis, dissection or occlusion. Left carotid system: Left CCA widely patent from its origin to the bifurcation without stenosis. Mild eccentric soft plaque at the left bifurcation without hemodynamically significant stenosis. Left ICA widely patent distally to the skull base without stenosis, dissection or occlusion. Vertebral arteries: Both vertebral arteries arise from the subclavian arteries. Dominant right vertebral artery widely patent within the neck. Focal plaque at the origin of the left vertebral artery without hemodynamically significant stenosis. Left vertebral artery otherwise widely patent without stenosis or other abnormality. Skeleton: No acute osseous abnormality. No discrete lytic or blastic osseous lesions. Other neck: No other acute soft tissue abnormality within the neck. Few subcentimeter hypodense nodules noted within the thyroid, which do not require follow-up imaging given size and patient age. No other mass lesion or adenopathy. Upper chest: Visualized upper chest demonstrates no acute finding. Partially visualized lungs are grossly clear. Review of the MIP images confirms the above findings CTA HEAD FINDINGS Anterior circulation: Petrous, cavernous, and supraclinoid ICAs widely patent without flow-limiting stenosis. A1 segments widely patent. Normal anterior communicating artery complex. Anterior cerebral  arteries widely patent to their distal aspects. No M1 stenosis or occlusion. Normal MCA bifurcations. Distal MCA branches well perfused and symmetric. Posterior circulation: Both vertebral arteries widely patent to the vertebrobasilar junction. Posterior inferior cerebral arteries patent bilaterally. Basilar widely patent to its distal aspect. Superior cerebral arteries and posterior cerebral arteries well perfused and widely patent. Venous sinuses: Grossly patent allowing for timing of the contrast bolus. Anatomic variants: None significant.  No intracranial aneurysm. Review of the MIP images confirms the above findings IMPRESSION: 1. Negative CTA of the head and neck. No large vessel occlusion or other acute vascular abnormality. 2. Mild atheromatous change about the carotid bifurcations without significant stenosis. No hemodynamically significant or correctable stenosis seen about the major arterial vasculature of the head and neck. Electronically Signed   By: Jeannine Boga M.D.   On: 02/13/2019 00:24   CT Angio Neck W and/or Wo Contrast  Result Date: 02/13/2019 CLINICAL DATA:  Follow-up examination for acute stroke. Left-sided facial numbness. EXAM: CT ANGIOGRAPHY HEAD AND NECK TECHNIQUE: Multidetector CT imaging of the head and neck was performed using the standard protocol during bolus administration of intravenous contrast. Multiplanar CT image reconstructions and MIPs were obtained to evaluate the vascular anatomy. Carotid stenosis measurements (when applicable) are obtained utilizing NASCET criteria, using the distal internal carotid diameter as the denominator. CONTRAST:  72mL OMNIPAQUE IOHEXOL 350 MG/ML SOLN COMPARISON:  Prior head CT from earlier the same day. FINDINGS: CTA NECK FINDINGS Aortic arch: Visualized aortic arch of normal caliber with normal 3 vessel morphology. Mild scattered atheromatous plaque about the arch itself. No hemodynamically significant stenosis seen about the origin  of the great vessels. Visualized subclavian arteries widely patent. Right carotid system: Right common carotid artery widely patent from its origin to the bifurcation without stenosis. Mild eccentric soft plaque at the right bifurcation without hemodynamically significant stenosis. Right ICA widely patent distally to the skull base without stenosis, dissection or occlusion. Left carotid system: Left CCA widely patent from its origin to the bifurcation without stenosis. Mild eccentric soft plaque at the left bifurcation without hemodynamically significant stenosis. Left ICA widely patent distally to the skull base without stenosis, dissection or occlusion. Vertebral arteries: Both vertebral arteries arise from the subclavian arteries. Dominant right vertebral artery widely patent within the neck. Focal plaque at the origin of the left vertebral artery without hemodynamically significant stenosis. Left  vertebral artery otherwise widely patent without stenosis or other abnormality. Skeleton: No acute osseous abnormality. No discrete lytic or blastic osseous lesions. Other neck: No other acute soft tissue abnormality within the neck. Few subcentimeter hypodense nodules noted within the thyroid, which do not require follow-up imaging given size and patient age. No other mass lesion or adenopathy. Upper chest: Visualized upper chest demonstrates no acute finding. Partially visualized lungs are grossly clear. Review of the MIP images confirms the above findings CTA HEAD FINDINGS Anterior circulation: Petrous, cavernous, and supraclinoid ICAs widely patent without flow-limiting stenosis. A1 segments widely patent. Normal anterior communicating artery complex. Anterior cerebral arteries widely patent to their distal aspects. No M1 stenosis or occlusion. Normal MCA bifurcations. Distal MCA branches well perfused and symmetric. Posterior circulation: Both vertebral arteries widely patent to the vertebrobasilar junction.  Posterior inferior cerebral arteries patent bilaterally. Basilar widely patent to its distal aspect. Superior cerebral arteries and posterior cerebral arteries well perfused and widely patent. Venous sinuses: Grossly patent allowing for timing of the contrast bolus. Anatomic variants: None significant.  No intracranial aneurysm. Review of the MIP images confirms the above findings IMPRESSION: 1. Negative CTA of the head and neck. No large vessel occlusion or other acute vascular abnormality. 2. Mild atheromatous change about the carotid bifurcations without significant stenosis. No hemodynamically significant or correctable stenosis seen about the major arterial vasculature of the head and neck. Electronically Signed   By: Jeannine Boga M.D.   On: 02/13/2019 00:24   MR BRAIN WO CONTRAST  Result Date: 02/13/2019 CLINICAL DATA:  Initial evaluation for acute left-sided weakness. EXAM: MRI HEAD WITHOUT CONTRAST TECHNIQUE: Multiplanar, multiecho pulse sequences of the brain and surrounding structures were obtained without intravenous contrast. COMPARISON:  Prior CT and CTA from 02/12/2019. FINDINGS: Brain: Examination technically limited by motion artifact. Additionally, mild localized susceptibility artifact present at the left frontal calvarium. Cerebral volume within normal limits for patient age. No focal parenchymal signal abnormality identified. No abnormal foci of restricted diffusion to suggest acute or subacute ischemia. Gray-white matter differentiation well maintained. No encephalomalacia to suggest chronic infarction. No foci of susceptibility artifact to suggest acute or chronic intracranial hemorrhage. No mass lesion, midline shift or mass effect. No hydrocephalus. No extra-axial fluid collection. Major dural sinuses are grossly patent. Pituitary gland and suprasellar region are normal. Midline structures intact and normal. Vascular: Major intracranial vascular flow voids well maintained and  normal in appearance. Skull and upper cervical spine: Craniocervical junction normal. Visualized upper cervical spine within normal limits. Bone marrow signal intensity normal. No scalp soft tissue abnormality. Sinuses/Orbits: Globes and orbital soft tissues within normal limits. Paranasal sinuses are clear. No mastoid effusion. Inner ear structures normal. Other: None. IMPRESSION: Negative brain MRI. No acute intracranial infarct or other abnormality identified. Electronically Signed   By: Jeannine Boga M.D.   On: 02/13/2019 04:17   CT HEAD CODE STROKE WO CONTRAST  Result Date: 02/12/2019 CLINICAL DATA:  Code stroke. Left facial and arm numbness. Hypertension. EXAM: CT HEAD WITHOUT CONTRAST TECHNIQUE: Contiguous axial images were obtained from the base of the skull through the vertex without intravenous contrast. COMPARISON:  Head CT 03/21/2008 FINDINGS: Brain: There is no evidence of acute infarct, intracranial hemorrhage, mass, midline shift, or extra-axial fluid collection. The ventricles and sulci are normal. Vascular: No hyperdense vessel. Skull: No fracture or focal osseous lesion. Sinuses/Orbits: Visualized paranasal sinuses and mastoid air cells are clear. Visualized orbits are unremarkable. Other: None. ASPECTS [facility] Stroke Program Early CT Score) - Ganglionic  level infarction (caudate, lentiform nuclei, internal capsule, insula, M1-M3 cortex): 7 - Supraganglionic infarction (M4-M6 cortex): 3 Total score (0-10 with 10 being normal): 10 IMPRESSION: 1. Negative head CT. 2. ASPECTS is 10. These results were called by telephone at the time of interpretation on 02/12/2019 at 9:25 pm to Dr. Nance Pear , who verbally acknowledged these results. Electronically Signed   By: Logan Bores M.D.   On: 02/12/2019 21:32    Assessment: 53 y.o. female with a history of HLD presenting with left sided numbness and weakness.  MRI of the brain reviewed and shows no acute changes.  BP elevated on  presentation.  Suspect TIA.  BP now normal.  Echocardiogram and carotid dopplers are pending.  LDL 135.  A1c 6.0.  Patient on no antiplatelet therapy prior to admission.    Stroke Risk Factors - hyperlipidemia  Plan: 1. Statin for lipid management with target LDL<70. 2. PT consult, OT consult, Speech consult 3. Echocardiogram pending 4. Carotid dopplers pending 5. Prophylactic therapy-Dual antiplatelet therapy with ASA 81mg  and Plavix 75mg  for three weeks with change to ASA 81mg  daily alone as monotherapy after that time. 6. Telemetry monitoring 7. Frequent neuro checks 8. EEG.  May be performed as an outpatient.     Alexis Goodell, MD Neurology [phone] 02/13/2019, 8:43 AM

## 2019-02-14 DIAGNOSIS — G459 Transient cerebral ischemic attack, unspecified: Secondary | ICD-10-CM | POA: Diagnosis not present

## 2019-02-14 DIAGNOSIS — I10 Essential (primary) hypertension: Secondary | ICD-10-CM

## 2019-02-14 DIAGNOSIS — E785 Hyperlipidemia, unspecified: Secondary | ICD-10-CM | POA: Diagnosis not present

## 2019-02-14 DIAGNOSIS — R531 Weakness: Secondary | ICD-10-CM | POA: Diagnosis not present

## 2019-02-14 MED ORDER — CLOPIDOGREL BISULFATE 75 MG PO TABS
75.0000 mg | ORAL_TABLET | Freq: Every day | ORAL | Status: DC
  Administered 2019-02-14: 13:00:00 75 mg via ORAL
  Filled 2019-02-14: qty 1

## 2019-02-14 MED ORDER — SIMVASTATIN 20 MG PO TABS: 20.0000 mg | ORAL_TABLET | Freq: Every day | ORAL | 1 refills | Status: DC

## 2019-02-14 MED ORDER — AMLODIPINE BESYLATE 5 MG PO TABS
5.0000 mg | ORAL_TABLET | Freq: Every day | ORAL | Status: DC
  Administered 2019-02-14: 08:00:00 5 mg via ORAL
  Filled 2019-02-14: qty 1

## 2019-02-14 MED ORDER — AMLODIPINE BESYLATE 5 MG PO TABS: 5.0000 mg | ORAL_TABLET | Freq: Every day | ORAL | 1 refills | Status: DC

## 2019-02-14 MED ORDER — ASPIRIN 81 MG PO TBEC: 81.0000 mg | DELAYED_RELEASE_TABLET | Freq: Every day | ORAL | 12 refills | Status: DC

## 2019-02-14 MED ORDER — CLOPIDOGREL BISULFATE 75 MG PO TABS: 75.0000 mg | ORAL_TABLET | Freq: Every day | ORAL | 0 refills | Status: DC

## 2019-02-14 NOTE — Progress Notes (Signed)
Physical Therapy Treatment Patient Details Name: Ashley [name] MRN: [id number] DOB: 1965-07-20 Today's Date: 02/14/2019    History of Present Illness 53 y.o. female with a history of HLD who reports that at 2000 on last evening she had acute onset of left sided numbness and weakness including the face, arm and leg.  Patient presented for evaluation at that time.  BP elevated.  Initial NIHSS of 2. Patient reported symptoms have improved today with some LLE numbness still remaining.  She reports symptoms began to improve after about 2-3 hours. MRI negative for acute infarct.    PT Comments    RN stated HR increased to 140's with gait to bathroom with nursing tech.  MD ok'ed session per primary nurse.  Bed mobility without assist.  She is able to stand and walk 35' with RW and min guard with verbal cues and education for walker positioning.  Limited by fatigue and LLE weakness.  After rest,s he is able to complete stair training up/down 8 steps with right rail to simulate home access to upstairs bedroom.  No buckling noted but she remains cautions.   Plan is discharge home today with family.  Lives with 3 adult sons.  Encouraged her to have +1 assist with mobility and +2 on stairs for safety initially at home.  Voiced understanding.     Follow Up Recommendations  Outpatient PT;Supervision for mobility/OOB     Equipment Recommendations  Rolling walker with 5" wheels;3in1 (PT)    Recommendations for Other Services       Precautions / Restrictions Precautions Precautions: Fall Restrictions Weight Bearing Restrictions: No    Mobility  Bed Mobility Overal bed mobility: Modified Independent                Transfers Overall transfer level: Needs assistance Equipment used: Rolling walker (2 wheeled) Transfers: Sit to/from Stand Sit to Stand: Min guard            Ambulation/Gait Ambulation/Gait assistance: Min guard Gait Distance (Feet): 35 Feet Assistive device:  Rolling walker (2 wheeled) Gait Pattern/deviations: Step-to pattern;Wide base of support Gait velocity: decreased   General Gait Details: Slow gait with hesitance to weight shift L.  fearful at times but overall steady.  fatigues quickly   Stairs Stairs: Yes Stairs assistance: Min assist Stair Management: One rail Right Number of Stairs: 8 General stair comments: slow and cautious but no buckling noted   Wheelchair Mobility    Modified Rankin (Stroke Patients Only)       Balance Overall balance assessment: Needs assistance Sitting-balance support: No upper extremity supported;Feet supported Sitting balance-Leahy Scale: Good     Standing balance support: Bilateral upper extremity supported Standing balance-Leahy Scale: Fair Standing balance comment: Heavily reliant on RW at this time due to fear of knee "buckling".                            Cognition Arousal/Alertness: Awake/alert Behavior During Therapy: WFL for tasks assessed/performed Overall Cognitive Status: Within Functional Limits for tasks assessed                                        Exercises      General Comments        Pertinent Vitals/Pain Pain Assessment: No/denies pain    Home Living  Prior Function            PT Goals (current goals can now be found in the care plan section) Progress towards PT goals: Progressing toward goals    Frequency    Min 2X/week      PT Plan Current plan remains appropriate    Co-evaluation              AM-PAC PT "6 Clicks" Mobility   Outcome Measure  Help needed turning from your back to your side while in a flat bed without using bedrails?: None Help needed moving from lying on your back to sitting on the side of a flat bed without using bedrails?: None Help needed moving to and from a bed to a chair (including a wheelchair)?: A Little Help needed standing up from a chair using your  arms (e.g., wheelchair or bedside chair)?: A Little Help needed to walk in hospital room?: A Little Help needed climbing 3-5 steps with a railing? : A Little 6 Click Score: 20    End of Session Equipment Utilized During Treatment: Gait belt Activity Tolerance: Patient limited by fatigue Patient left: in bed;with call bell/phone within reach;with bed alarm set Nurse Communication: Mobility status;Other (comment)       Time: [id number] PT Time Calculation (min) (ACUTE ONLY): 14 min  Charges:  $Gait Training: 8-22 mins                    Chesley Noon, PTA 02/14/19, 10:04 AM

## 2019-02-14 NOTE — Progress Notes (Signed)
Pt being discharged home, discharge instructions reviewed with pt and son, states understanding, pt with no complaints, walker and BSC with pt

## 2019-02-14 NOTE — Plan of Care (Signed)
Pt left leg is weak and pt still complains of headache. No other deficits or distress noted. Will continue to monitor.

## 2019-02-14 NOTE — Progress Notes (Signed)
Dr Fritzi Mandes made aware that pt up to BR with walker HR up to 140-147, HR back down to low 100s, but normally HR in the 70s, acknowledged

## 2019-02-14 NOTE — Progress Notes (Signed)
Per Dr Posey Pronto, EKG and ECHO WNL, continue to monitor HR

## 2019-02-14 NOTE — TOC Progression Note (Addendum)
Transition of Care [facility]) - Progression Note    Patient Details  Name: Khristian Seals MRN: [id number] Date of Birth: May 10, 1965  [facility]) CM/SW Contact  Truitt Merle, LCSW Phone Number: 02/14/2019, 12:48 PM  Clinical Narrative:    Met with patient at bedside to discuss discharge plans. Patient from home and lives with adult sons, who will be providing care for her. Patient's bedroom is upstairs, but states she will sleep on the couch and build strength gradually to climb stairs. Order placed for outpatient rehab and patient agreeable. 3-in-1 and RW delivered to bedside. Adult son at bedside to transport patient home. Patient has no other needs identified at this time. RN Estill Bamberg updated. LCSW signing off as no further needs identified.    Expected Discharge Plan: Home/Self Care Barriers to Discharge: No Barriers Identified  Expected Discharge Plan and Services Expected Discharge Plan: Home/Self Care     Post Acute Care Choice: Durable Medical Equipment Living arrangements for the past 2 months: Single Family Home Expected Discharge Date: 02/14/19               DME Arranged: 3-N-1, Walker rolling DME Agency: [employer] Date DME Agency Contacted: 02/14/19 Time DME Agency Contacted: [phone] Representative spoke with at DME Agency: [facility] [place] Arranged: NA           Social Determinants of Health (SDOH) Interventions    Readmission Risk Interventions No flowsheet data found.

## 2019-02-14 NOTE — Progress Notes (Signed)
Subjective: No new neurological complaints.    Objective: Current vital signs: BP 118/80 (BP Location: Left Arm)   Pulse 97   Temp 98.4 F (36.9 C) (Oral)   Resp 18   Ht 5\' 6"  (1.676 m)   Wt 75.5 kg   SpO2 99%   BMI 26.87 kg/m  Vital signs in last 24 hours: Temp:  [98.2 F (36.8 C)-98.6 F (37 C)] 98.4 F (36.9 C) (12/20 0509) Pulse Rate:  [70-125] 97 (12/20 0957) Resp:  [14-18] 18 (12/20 0743) BP: (118-139)/(80-87) 118/80 (12/20 0743) SpO2:  [99 %-100 %] 99 % (12/20 0743) Weight:  [75.5 kg] 75.5 kg (12/19 2050)  Intake/Output from previous day: No intake/output data recorded. Intake/Output this shift: No intake/output data recorded. Nutritional status:  Diet Order            Diet Heart Room service appropriate? Yes; Fluid consistency: Thin  Diet effective now              Neurologic Exam: Mental Status: Alert, oriented, thought content appropriate.  Speech fluent without evidence of aphasia.  Able to follow 3 step commands without difficulty. Cranial Nerves: II: Visual fields grossly normal, pupils equal, round, reactive to light and accommodation III,IV, VI: ptosis not present, extra-ocular motions intact bilaterally V,VII: smile symmetric, facial light touch sensation normal bilaterally VIII: hearing normal bilaterally IX,X: gag reflex present XI: bilateral shoulder shrug XII: midline tongue extension Motor: Right :  Upper extremity   5/5                                      Left:     Upper extremity   5/5             Lower extremity   5/5                                                  Lower extremity   4-/5 with the patient providing little effort and no reciprocal downward movement of the RLE noted Tone and bulk:normal tone throughout; no atrophy noted Sensory: Pinprick and light touch intact bilaterally   Lab Results: Basic Metabolic Panel: Recent Labs  Lab 02/12/19 2120  NA 138  K 3.9  CL 101  CO2 26  GLUCOSE 120*  BUN 12  CREATININE 0.70   CALCIUM 10.3    Liver Function Tests: Recent Labs  Lab 02/12/19 2120  AST 30  ALT 23  ALKPHOS 92  BILITOT 0.9  PROT 8.7*  ALBUMIN 4.9   No results for input(s): LIPASE, AMYLASE in the last 168 hours. No results for input(s): AMMONIA in the last 168 hours.  CBC: Recent Labs  Lab 02/12/19 2120  WBC 5.2  NEUTROABS 2.7  HGB 13.8  HCT 40.4  MCV 81.6  PLT 270    Cardiac Enzymes: No results for input(s): CKTOTAL, CKMB, CKMBINDEX, TROPONINI in the last 168 hours.  Lipid Panel: Recent Labs  Lab 02/13/19 0603  CHOL 221*  TRIG 45  HDL 77  CHOLHDL 2.9  VLDL 9  LDLCALC 135*    CBG: Recent Labs  Lab 02/12/19 2120  GLUCAP 68    Microbiology: Results for orders placed or performed during the hospital encounter of 02/12/19  SARS CORONAVIRUS 2 (TAT 6-24 HRS) Nasopharyngeal  Nasopharyngeal Swab     Status: None   Collection Time: 02/12/19 11:58 PM   Specimen: Nasopharyngeal Swab  Result Value Ref Range Status   SARS Coronavirus 2 NEGATIVE NEGATIVE Final    Comment: (NOTE) SARS-CoV-2 target nucleic acids are NOT DETECTED. The SARS-CoV-2 RNA is generally detectable in upper and lower respiratory specimens during the acute phase of infection. Negative results do not preclude SARS-CoV-2 infection, do not rule out co-infections with other pathogens, and should not be used as the sole basis for treatment or other patient management decisions. Negative results must be combined with clinical observations, patient history, and epidemiological information. The expected result is Negative. Fact Sheet for Patients: SugarRoll.be Fact Sheet for Healthcare Providers: https://www.woods-mathews.com/ This test is not yet approved or cleared by the [place] FDA and  has been authorized for detection and/or diagnosis of SARS-CoV-2 by FDA under an Emergency Use Authorization (EUA). This EUA will remain  in effect (meaning this test can  be used) for the duration of the COVID-19 declaration under Section 56 4(b)(1) of the Act, 21 U.S.C. section 360bbb-3(b)(1), unless the authorization is terminated or revoked sooner. Performed at [facility], [place] [address] [place], [place] [address]     Coagulation Studies: Recent Labs    02/12/19 06/12/2118  LABPROT 11.5  INR 0.9    Imaging: CT Angio Head W or Wo Contrast  Result Date: 02/13/2019 CLINICAL DATA:  Follow-up examination for acute stroke. Left-sided facial numbness. EXAM: CT ANGIOGRAPHY HEAD AND NECK TECHNIQUE: Multidetector CT imaging of the head and neck was performed using the standard protocol during bolus administration of intravenous contrast. Multiplanar CT image reconstructions and MIPs were obtained to evaluate the vascular anatomy. Carotid stenosis measurements (when applicable) are obtained utilizing NASCET criteria, using the distal internal carotid diameter as the denominator. CONTRAST:  75mL OMNIPAQUE IOHEXOL 350 MG/ML SOLN COMPARISON:  Prior head CT from earlier the same day. FINDINGS: CTA NECK FINDINGS Aortic arch: Visualized aortic arch of normal caliber with normal 3 vessel morphology. Mild scattered atheromatous plaque about the arch itself. No hemodynamically significant stenosis seen about the origin of the great vessels. Visualized subclavian arteries widely patent. Right carotid system: Right common carotid artery widely patent from its origin to the bifurcation without stenosis. Mild eccentric soft plaque at the right bifurcation without hemodynamically significant stenosis. Right ICA widely patent distally to the skull base without stenosis, dissection or occlusion. Left carotid system: Left CCA widely patent from its origin to the bifurcation without stenosis. Mild eccentric soft plaque at the left bifurcation without hemodynamically significant stenosis. Left ICA widely patent distally to the skull base without stenosis, dissection or occlusion.  Vertebral arteries: Both vertebral arteries arise from the subclavian arteries. Dominant right vertebral artery widely patent within the neck. Focal plaque at the origin of the left vertebral artery without hemodynamically significant stenosis. Left vertebral artery otherwise widely patent without stenosis or other abnormality. Skeleton: No acute osseous abnormality. No discrete lytic or blastic osseous lesions. Other neck: No other acute soft tissue abnormality within the neck. Few subcentimeter hypodense nodules noted within the thyroid, which do not require follow-up imaging given size and patient age. No other mass lesion or adenopathy. Upper chest: Visualized upper chest demonstrates no acute finding. Partially visualized lungs are grossly clear. Review of the MIP images confirms the above findings CTA HEAD FINDINGS Anterior circulation: Petrous, cavernous, and supraclinoid ICAs widely patent without flow-limiting stenosis. A1 segments widely patent. Normal anterior communicating artery complex. Anterior cerebral  arteries widely patent to their distal aspects. No M1 stenosis or occlusion. Normal MCA bifurcations. Distal MCA branches well perfused and symmetric. Posterior circulation: Both vertebral arteries widely patent to the vertebrobasilar junction. Posterior inferior cerebral arteries patent bilaterally. Basilar widely patent to its distal aspect. Superior cerebral arteries and posterior cerebral arteries well perfused and widely patent. Venous sinuses: Grossly patent allowing for timing of the contrast bolus. Anatomic variants: None significant.  No intracranial aneurysm. Review of the MIP images confirms the above findings IMPRESSION: 1. Negative CTA of the head and neck. No large vessel occlusion or other acute vascular abnormality. 2. Mild atheromatous change about the carotid bifurcations without significant stenosis. No hemodynamically significant or correctable stenosis seen about the major arterial  vasculature of the head and neck. Electronically Signed   By: Jeannine Boga M.D.   On: 02/13/2019 00:24   CT Angio Neck W and/or Wo Contrast  Result Date: 02/13/2019 CLINICAL DATA:  Follow-up examination for acute stroke. Left-sided facial numbness. EXAM: CT ANGIOGRAPHY HEAD AND NECK TECHNIQUE: Multidetector CT imaging of the head and neck was performed using the standard protocol during bolus administration of intravenous contrast. Multiplanar CT image reconstructions and MIPs were obtained to evaluate the vascular anatomy. Carotid stenosis measurements (when applicable) are obtained utilizing NASCET criteria, using the distal internal carotid diameter as the denominator. CONTRAST:  67mL OMNIPAQUE IOHEXOL 350 MG/ML SOLN COMPARISON:  Prior head CT from earlier the same day. FINDINGS: CTA NECK FINDINGS Aortic arch: Visualized aortic arch of normal caliber with normal 3 vessel morphology. Mild scattered atheromatous plaque about the arch itself. No hemodynamically significant stenosis seen about the origin of the great vessels. Visualized subclavian arteries widely patent. Right carotid system: Right common carotid artery widely patent from its origin to the bifurcation without stenosis. Mild eccentric soft plaque at the right bifurcation without hemodynamically significant stenosis. Right ICA widely patent distally to the skull base without stenosis, dissection or occlusion. Left carotid system: Left CCA widely patent from its origin to the bifurcation without stenosis. Mild eccentric soft plaque at the left bifurcation without hemodynamically significant stenosis. Left ICA widely patent distally to the skull base without stenosis, dissection or occlusion. Vertebral arteries: Both vertebral arteries arise from the subclavian arteries. Dominant right vertebral artery widely patent within the neck. Focal plaque at the origin of the left vertebral artery without hemodynamically significant stenosis. Left  vertebral artery otherwise widely patent without stenosis or other abnormality. Skeleton: No acute osseous abnormality. No discrete lytic or blastic osseous lesions. Other neck: No other acute soft tissue abnormality within the neck. Few subcentimeter hypodense nodules noted within the thyroid, which do not require follow-up imaging given size and patient age. No other mass lesion or adenopathy. Upper chest: Visualized upper chest demonstrates no acute finding. Partially visualized lungs are grossly clear. Review of the MIP images confirms the above findings CTA HEAD FINDINGS Anterior circulation: Petrous, cavernous, and supraclinoid ICAs widely patent without flow-limiting stenosis. A1 segments widely patent. Normal anterior communicating artery complex. Anterior cerebral arteries widely patent to their distal aspects. No M1 stenosis or occlusion. Normal MCA bifurcations. Distal MCA branches well perfused and symmetric. Posterior circulation: Both vertebral arteries widely patent to the vertebrobasilar junction. Posterior inferior cerebral arteries patent bilaterally. Basilar widely patent to its distal aspect. Superior cerebral arteries and posterior cerebral arteries well perfused and widely patent. Venous sinuses: Grossly patent allowing for timing of the contrast bolus. Anatomic variants: None significant.  No intracranial aneurysm. Review of the MIP images confirms  the above findings IMPRESSION: 1. Negative CTA of the head and neck. No large vessel occlusion or other acute vascular abnormality. 2. Mild atheromatous change about the carotid bifurcations without significant stenosis. No hemodynamically significant or correctable stenosis seen about the major arterial vasculature of the head and neck. Electronically Signed   By: Jeannine Boga M.D.   On: 02/13/2019 00:24   MR BRAIN WO CONTRAST  Result Date: 02/13/2019 CLINICAL DATA:  Initial evaluation for acute left-sided weakness. EXAM: MRI HEAD  WITHOUT CONTRAST TECHNIQUE: Multiplanar, multiecho pulse sequences of the brain and surrounding structures were obtained without intravenous contrast. COMPARISON:  Prior CT and CTA from 02/12/2019. FINDINGS: Brain: Examination technically limited by motion artifact. Additionally, mild localized susceptibility artifact present at the left frontal calvarium. Cerebral volume within normal limits for patient age. No focal parenchymal signal abnormality identified. No abnormal foci of restricted diffusion to suggest acute or subacute ischemia. Gray-white matter differentiation well maintained. No encephalomalacia to suggest chronic infarction. No foci of susceptibility artifact to suggest acute or chronic intracranial hemorrhage. No mass lesion, midline shift or mass effect. No hydrocephalus. No extra-axial fluid collection. Major dural sinuses are grossly patent. Pituitary gland and suprasellar region are normal. Midline structures intact and normal. Vascular: Major intracranial vascular flow voids well maintained and normal in appearance. Skull and upper cervical spine: Craniocervical junction normal. Visualized upper cervical spine within normal limits. Bone marrow signal intensity normal. No scalp soft tissue abnormality. Sinuses/Orbits: Globes and orbital soft tissues within normal limits. Paranasal sinuses are clear. No mastoid effusion. Inner ear structures normal. Other: None. IMPRESSION: Negative brain MRI. No acute intracranial infarct or other abnormality identified. Electronically Signed   By: Jeannine Boga M.D.   On: 02/13/2019 04:17   ECHOCARDIOGRAM COMPLETE  Result Date: 02/13/2019   ECHOCARDIOGRAM REPORT   Patient Name:   [facility] Date of Exam: 02/13/2019 Medical Rec #:  [id number]             Height:       66.0 in Accession #:    [id number]            Weight:       165.0 lb Date of Birth:  19-Jun-1965            BSA:          1.84 m Patient Age:    20 years              BP:            124/81 mmHg Patient Gender: F                     HR:           71 bpm. Exam Location:  [facility] Procedure: 2D Echo Indications:     TIA 435.9/G45.9  History:         Patient has no prior history of Echocardiogram examinations.                  Risk Factors:Dyslipidemia.  Sonographer:     Avanell Shackleton Referring Phys:  [id number] [place] Diagnosing Phys: Skeet Latch MD IMPRESSIONS  1. Left ventricular ejection fraction, by visual estimation, is 60 to 65%. The left ventricle has normal function. Left ventricular septal wall thickness was normal. Normal left ventricular posterior wall thickness. There is no left ventricular hypertrophy.  2. The left ventricle has no regional wall motion abnormalities.  3. Global right ventricle has normal  systolic function.The right ventricular size is normal. No increase in right ventricular wall thickness.  4. Left atrial size was normal.  5. Right atrial size was normal.  6. The mitral valve is normal in structure. No evidence of mitral valve regurgitation. No evidence of mitral stenosis.  7. The tricuspid valve is normal in structure. Tricuspid valve regurgitation is mild.  8. The aortic valve is tricuspid. Aortic valve regurgitation is not visualized. No evidence of aortic valve sclerosis or stenosis.  9. The pulmonic valve was normal in structure. Pulmonic valve regurgitation is trivial. 10. Normal pulmonary artery systolic pressure. 11. The inferior vena cava is normal in size with greater than 50% respiratory variability, suggesting right atrial pressure of 3 mmHg. FINDINGS  Left Ventricle: Left ventricular ejection fraction, by visual estimation, is 60 to 65%. The left ventricle has normal function. The left ventricle has no regional wall motion abnormalities. Normal left ventricular posterior wall thickness. There is no left ventricular hypertrophy. Left ventricular diastolic parameters were normal. Normal left atrial pressure. Right Ventricle: The right ventricular  size is normal. No increase in right ventricular wall thickness. Global RV systolic function is has normal systolic function. The tricuspid regurgitant velocity is 2.02 m/s, and with an assumed right atrial pressure  of 3 mmHg, the estimated right ventricular systolic pressure is normal at 19.2 mmHg. Left Atrium: Left atrial size was normal in size. Right Atrium: Right atrial size was normal in size Pericardium: There is no evidence of pericardial effusion. Mitral Valve: The mitral valve is normal in structure. No evidence of mitral valve regurgitation. No evidence of mitral valve stenosis by observation. Tricuspid Valve: The tricuspid valve is normal in structure. Tricuspid valve regurgitation is mild. Aortic Valve: The aortic valve is tricuspid. Aortic valve regurgitation is not visualized. The aortic valve is structurally normal, with no evidence of sclerosis or stenosis. Pulmonic Valve: The pulmonic valve was normal in structure. Pulmonic valve regurgitation is trivial. Pulmonic regurgitation is trivial. Aorta: The aortic root, ascending aorta and aortic arch are all structurally normal, with no evidence of dilitation or obstruction. Venous: The inferior vena cava is normal in size with greater than 50% respiratory variability, suggesting right atrial pressure of 3 mmHg. IAS/Shunts: No atrial level shunt detected by color flow Doppler. There is no evidence of a patent foramen ovale. No ventricular septal defect is seen or detected. There is no evidence of an atrial septal defect.  LEFT VENTRICLE PLAX 2D LVIDd:         4.33 cm  Diastology LVIDs:         2.74 cm  LV e' lateral:   10.40 cm/s LV PW:         0.95 cm  LV E/e' lateral: 9.1 LV IVS:        0.99 cm  LV e' medial:    7.07 cm/s LVOT diam:     2.20 cm  LV E/e' medial:  13.5 LV SV:         56 ml LV SV Index:   29.98 LVOT Area:     3.80 cm  RIGHT VENTRICLE             IVC RV S prime:     15.30 cm/s  IVC diam: 1.00 cm LEFT ATRIUM           Index       RIGHT  ATRIUM           Index LA diam:  3.70 cm 2.01 cm/m  RA Area:     12.60 cm LA Vol (A4C): 40.4 ml 21.92 ml/m RA Volume:   25.30 ml  13.73 ml/m   AORTA Ao Root diam: 3.20 cm MITRAL VALVE                        TRICUSPID VALVE MV Area (PHT): 3.60 cm             TR Peak grad:   16.2 mmHg MV PHT:        61.19 msec           TR Vmax:        233.00 cm/s MV Decel Time: 211 msec MV E velocity: 95.10 cm/s 103 cm/s  SHUNTS MV A velocity: 78.60 cm/s 70.3 cm/s Systemic Diam: 2.20 cm MV E/A ratio:  1.21       1.5  Skeet Latch MD Electronically signed by Skeet Latch MD Signature Date/Time: 02/13/2019/2:36:56 PM    Final    CT HEAD CODE STROKE WO CONTRAST  Result Date: 02/12/2019 CLINICAL DATA:  Code stroke. Left facial and arm numbness. Hypertension. EXAM: CT HEAD WITHOUT CONTRAST TECHNIQUE: Contiguous axial images were obtained from the base of the skull through the vertex without intravenous contrast. COMPARISON:  Head CT 03/21/2008 FINDINGS: Brain: There is no evidence of acute infarct, intracranial hemorrhage, mass, midline shift, or extra-axial fluid collection. The ventricles and sulci are normal. Vascular: No hyperdense vessel. Skull: No fracture or focal osseous lesion. Sinuses/Orbits: Visualized paranasal sinuses and mastoid air cells are clear. Visualized orbits are unremarkable. Other: None. ASPECTS [facility] Stroke Program Early CT Score) - Ganglionic level infarction (caudate, lentiform nuclei, internal capsule, insula, M1-M3 cortex): 7 - Supraganglionic infarction (M4-M6 cortex): 3 Total score (0-10 with 10 being normal): 10 IMPRESSION: 1. Negative head CT. 2. ASPECTS is 10. These results were called by telephone at the time of interpretation on 02/12/2019 at 9:25 pm to Dr. Nance Pear , who verbally acknowledged these results. Electronically Signed   By: Logan Bores M.D.   On: 02/12/2019 21:32    Medications:  I have reviewed the patient's current medications. Scheduled: . amLODipine   5 mg Oral Daily  . aspirin  81 mg Oral Daily  . enoxaparin (LOVENOX) injection  40 mg Subcutaneous Q24H  . fluticasone  2 spray Each Nare Daily  . loratadine  10 mg Oral Daily  . pantoprazole  40 mg Oral Daily  . simvastatin  20 mg Oral q1800    Assessment/Plan: 53 y.o. female with a history of HLD presenting with left sided numbness and weakness.  MRI of the brain reviewed and shows no acute changes.  BP elevated on presentation.  Suspect TIA.  BP now normal. CTA shows no evidence of hemodynamically significant stenosis.  Echocardiogram shows no cardiac source of emboli with an EF of 60-65%.  LDL 135.  A1c 6.0.  Patient on no antiplatelet therapy prior to admission.   Recommendations: 1. Statin for lipid management with target LDL<70. 2. PT consult, OT consult, Speech consult 3. Prophylactic therapy-Dual antiplatelet therapy with ASA 81mg  and Plavix 75mg  for three weeks with change to ASA 81mg  daily alone as monotherapy after that time. 4. Telemetry monitoring 5. Frequent neuro checks 6. EEG.  May be performed as an outpatient.     LOS: 0 days   Alexis Goodell, MD Neurology [phone] 02/14/2019  10:07 AM

## 2019-02-14 NOTE — Discharge Summary (Signed)
Ashley [name] at [place] NAME: Ashley [name]    MR#:  [id number]  DATE OF BIRTH:  Apr 27, 1965  DATE OF ADMISSION:  02/12/2019 ADMITTING PHYSICIAN: Christel Mormon, MD  DATE OF DISCHARGE: 02/14/2019  PRIMARY CARE PHYSICIAN: Pleas Koch, NP    ADMISSION DIAGNOSIS:  CVA (cerebral vascular accident) ([place]) [I63.9] Left-sided weakness [R53.1]  DISCHARGE DIAGNOSIS:  TIA HTN--new  SECONDARY DIAGNOSIS:   Past Medical History:  Diagnosis Date  . Anemia   . Bronchitis    2 years ago   . [place] arthritis 2019   base of left thumb   . GERD (gastroesophageal reflux disease)    diet controlled , no meds  . Hydrosalpinx    bilateral   . Hyperlipidemia    recent dx - currently diet control - no meds  . Hypothyroid 2003   radio active iodine - no meds  . SVD (spontaneous vaginal delivery)    x 1  . Vitamin B 12 deficiency     HOSPITAL COURSE:  SharonWilliamsis a53 y.o.African-American femalewith a known history of GERD, dyslipidemia and hypothyroidism, who presented to the emergency room with a consult of left-sided weakness that started around 8 PM in the left side of her face and extended to her left arm then let her left leg. She admits to mild associated paresthesias at the time.    1.Left-sided weakness concerning for TIA, ruled out  CVA. - on aspirin and plavix  - MRI Brain negative -Appreciate Dr. Doy Mince input-- recommends aspirin Plavix for three weeks and thereafter 81 mg aspirin daily -2D echo EF of 55 to 60%. No valvular abnormality. -CTA neck negative -physical therapy recommends outpatient rehab--- referral made  2. Dyslipidemia.  - on statin therapy.  3. Hypertensive urgency. She will be placed on as needed IV labetalol with permissive parameters. -no h/o HTN per pt but there are previous readings in chart (outpt) that were elevated -patient's map has been on the higher side reviewing outpatient nodes  blood pressure has been on and off on the higher side discussed with her will start her on 5 mg of Norvasc daily. She'll keep log of blood pressure at home and review it with primary care physician at follow-up  4. GERD.PPI therapy will be resumed.  5. DVT prophylaxis. Subcut Lovenox  patient will go home with rolling walker, bedside commode and outpatient physical therapy. She is in agreement with the plan.  Procedures:none Family communication :pt Consults :Neurology Discharge Disposition : home CODE STATUS: full DVT Prophylaxis :Lovenox   CONSULTS OBTAINED:  Treatment Team:  Catarina Hartshorn, MD Alexis Goodell, MD  DRUG ALLERGIES:  No Known Allergies  DISCHARGE MEDICATIONS:   Allergies as of 02/14/2019   No Known Allergies     Medication List    TAKE these medications   amLODipine 5 MG tablet Commonly known as: NORVASC Take 1 tablet (5 mg total) by mouth daily. Start taking on: February 15, 2019   aspirin 81 MG EC tablet Take 1 tablet (81 mg total) by mouth daily. Swallow whole. Start taking on: February 15, 2019   cetirizine 10 MG tablet Commonly known as: ZYRTEC Take 1 tablet (10 mg total) by mouth daily. For allergies.   clopidogrel 75 MG tablet Commonly known as: PLAVIX Take 1 tablet (75 mg total) by mouth daily. Start taking on: February 15, 2019   cyclobenzaprine 10 MG tablet Commonly known as: FLEXERIL Take 1 tablet (10 mg total) by mouth 3 (  three) times daily as needed.   fluticasone 50 MCG/ACT nasal spray Commonly known as: FLONASE Place 2 sprays into both nostrils daily.   meloxicam 15 MG tablet Commonly known as: MOBIC Take 1 tablet (15 mg total) by mouth daily as needed for pain.   pantoprazole 20 MG tablet Commonly known as: Protonix Take 1 tablet by mouth once to twice daily for heartburn.   simvastatin 20 MG tablet Commonly known as: ZOCOR Take 1 tablet (20 mg total) by mouth daily at 6 PM.       If you experience  worsening of your admission symptoms, develop shortness of breath, life threatening emergency, suicidal or homicidal thoughts you must seek medical attention immediately by calling 911 or calling your MD immediately  if symptoms less severe.  You Must read complete instructions/literature along with all the possible adverse reactions/side effects for all the Medicines you take and that have been prescribed to you. Take any new Medicines after you have completely understood and accept all the possible adverse reactions/side effects.   Please note  You were cared for by a hospitalist during your hospital stay. If you have any questions about your discharge medications or the care you received while you were in the hospital after you are discharged, you can call the unit and asked to speak with the hospitalist on call if the hospitalist that took care of you is not available. Once you are discharged, your primary care physician will handle any further medical issues. Please note that NO REFILLS for any discharge medications will be authorized once you are discharged, as it is imperative that you return to your primary care physician (or establish a relationship with a primary care physician if you do not have one) for your aftercare needs so that they can reassess your need for medications and monitor your lab values. Today   SUBJECTIVE   mild left lower extremity weakness.  VITAL SIGNS:  Blood pressure 118/80, pulse 97, temperature 98.4 F (36.9 C), temperature source Oral, resp. rate 18, height 5\' 6"  (1.676 m), weight 75.5 kg, SpO2 99 %.  I/O:    Intake/Output Summary (Last 24 hours) at 02/14/2019 1129 Last data filed at 02/14/2019 0940 Gross per 24 hour  Intake 240 ml  Output -  Net 240 ml    PHYSICAL EXAMINATION:  GENERAL:  53 y.o.-year-old patient lying in the bed with no acute distress.  EYES: Pupils equal, round, reactive to light and accommodation. No scleral icterus. Extraocular  muscles intact.  HEENT: Head atraumatic, normocephalic. Oropharynx and nasopharynx clear.  NECK:  Supple, no jugular venous distention. No thyroid enlargement, no tenderness.  LUNGS: Normal breath sounds bilaterally, no wheezing, rales,rhonchi or crepitation. No use of accessory muscles of respiration.  CARDIOVASCULAR: S1, S2 normal. No murmurs, rubs, or gallops.  ABDOMEN: Soft, non-tender, non-distended. Bowel sounds present. No organomegaly or mass.  EXTREMITIES: No pedal edema, cyanosis, or clubbing.  NEUROLOGIC: Cranial nerves II through XII are intact. Muscle strength 5/5 in all extremities. Sensation intact. Gait not checked.  PSYCHIATRIC: The patient is alert and oriented x 3.  SKIN: No obvious rash, lesion, or ulcer.   DATA REVIEW:   CBC  Recent Labs  Lab 02/12/19 2120  WBC 5.2  HGB 13.8  HCT 40.4  PLT 270    Chemistries  Recent Labs  Lab 02/12/19 2120  NA 138  K 3.9  CL 101  CO2 26  GLUCOSE 120*  BUN 12  CREATININE 0.70  CALCIUM 10.3  AST 30  ALT 23  ALKPHOS 92  BILITOT 0.9    Microbiology Results   Recent Results (from the past 240 hour(s))  SARS CORONAVIRUS 2 (TAT 6-24 HRS) Nasopharyngeal Nasopharyngeal Swab     Status: None   Collection Time: 02/12/19 11:58 PM   Specimen: Nasopharyngeal Swab  Result Value Ref Range Status   SARS Coronavirus 2 NEGATIVE NEGATIVE Final    Comment: (NOTE) SARS-CoV-2 target nucleic acids are NOT DETECTED. The SARS-CoV-2 RNA is generally detectable in upper and lower respiratory specimens during the acute phase of infection. Negative results do not preclude SARS-CoV-2 infection, do not rule out co-infections with other pathogens, and should not be used as the sole basis for treatment or other patient management decisions. Negative results must be combined with clinical observations, patient history, and epidemiological information. The expected result is Negative. Fact Sheet for  Patients: SugarRoll.be Fact Sheet for Healthcare Providers: https://www.woods-mathews.com/ This test is not yet approved or cleared by the [place] FDA and  has been authorized for detection and/or diagnosis of SARS-CoV-2 by FDA under an Emergency Use Authorization (EUA). This EUA will remain  in effect (meaning this test can be used) for the duration of the COVID-19 declaration under Section 56 4(b)(1) of the Act, 21 U.S.C. section 360bbb-3(b)(1), unless the authorization is terminated or revoked sooner. Performed at [facility], [place] [address], [place], [place] [address]     RADIOLOGY:  CT Angio Head W or Wo Contrast  Result Date: 02/13/2019 CLINICAL DATA:  Follow-up examination for acute stroke. Left-sided facial numbness. EXAM: CT ANGIOGRAPHY HEAD AND NECK TECHNIQUE: Multidetector CT imaging of the head and neck was performed using the standard protocol during bolus administration of intravenous contrast. Multiplanar CT image reconstructions and MIPs were obtained to evaluate the vascular anatomy. Carotid stenosis measurements (when applicable) are obtained utilizing NASCET criteria, using the distal internal carotid diameter as the denominator. CONTRAST:  57mL OMNIPAQUE IOHEXOL 350 MG/ML SOLN COMPARISON:  Prior head CT from earlier the same day. FINDINGS: CTA NECK FINDINGS Aortic arch: Visualized aortic arch of normal caliber with normal 3 vessel morphology. Mild scattered atheromatous plaque about the arch itself. No hemodynamically significant stenosis seen about the origin of the great vessels. Visualized subclavian arteries widely patent. Right carotid system: Right common carotid artery widely patent from its origin to the bifurcation without stenosis. Mild eccentric soft plaque at the right bifurcation without hemodynamically significant stenosis. Right ICA widely patent distally to the skull base without stenosis, dissection or  occlusion. Left carotid system: Left CCA widely patent from its origin to the bifurcation without stenosis. Mild eccentric soft plaque at the left bifurcation without hemodynamically significant stenosis. Left ICA widely patent distally to the skull base without stenosis, dissection or occlusion. Vertebral arteries: Both vertebral arteries arise from the subclavian arteries. Dominant right vertebral artery widely patent within the neck. Focal plaque at the origin of the left vertebral artery without hemodynamically significant stenosis. Left vertebral artery otherwise widely patent without stenosis or other abnormality. Skeleton: No acute osseous abnormality. No discrete lytic or blastic osseous lesions. Other neck: No other acute soft tissue abnormality within the neck. Few subcentimeter hypodense nodules noted within the thyroid, which do not require follow-up imaging given size and patient age. No other mass lesion or adenopathy. Upper chest: Visualized upper chest demonstrates no acute finding. Partially visualized lungs are grossly clear. Review of the MIP images confirms the above findings CTA HEAD FINDINGS Anterior circulation: Petrous, cavernous, and supraclinoid ICAs  widely patent without flow-limiting stenosis. A1 segments widely patent. Normal anterior communicating artery complex. Anterior cerebral arteries widely patent to their distal aspects. No M1 stenosis or occlusion. Normal MCA bifurcations. Distal MCA branches well perfused and symmetric. Posterior circulation: Both vertebral arteries widely patent to the vertebrobasilar junction. Posterior inferior cerebral arteries patent bilaterally. Basilar widely patent to its distal aspect. Superior cerebral arteries and posterior cerebral arteries well perfused and widely patent. Venous sinuses: Grossly patent allowing for timing of the contrast bolus. Anatomic variants: None significant.  No intracranial aneurysm. Review of the MIP images confirms the  above findings IMPRESSION: 1. Negative CTA of the head and neck. No large vessel occlusion or other acute vascular abnormality. 2. Mild atheromatous change about the carotid bifurcations without significant stenosis. No hemodynamically significant or correctable stenosis seen about the major arterial vasculature of the head and neck. Electronically Signed   By: Jeannine Boga M.D.   On: 02/13/2019 00:24   CT Angio Neck W and/or Wo Contrast  Result Date: 02/13/2019 CLINICAL DATA:  Follow-up examination for acute stroke. Left-sided facial numbness. EXAM: CT ANGIOGRAPHY HEAD AND NECK TECHNIQUE: Multidetector CT imaging of the head and neck was performed using the standard protocol during bolus administration of intravenous contrast. Multiplanar CT image reconstructions and MIPs were obtained to evaluate the vascular anatomy. Carotid stenosis measurements (when applicable) are obtained utilizing NASCET criteria, using the distal internal carotid diameter as the denominator. CONTRAST:  55mL OMNIPAQUE IOHEXOL 350 MG/ML SOLN COMPARISON:  Prior head CT from earlier the same day. FINDINGS: CTA NECK FINDINGS Aortic arch: Visualized aortic arch of normal caliber with normal 3 vessel morphology. Mild scattered atheromatous plaque about the arch itself. No hemodynamically significant stenosis seen about the origin of the great vessels. Visualized subclavian arteries widely patent. Right carotid system: Right common carotid artery widely patent from its origin to the bifurcation without stenosis. Mild eccentric soft plaque at the right bifurcation without hemodynamically significant stenosis. Right ICA widely patent distally to the skull base without stenosis, dissection or occlusion. Left carotid system: Left CCA widely patent from its origin to the bifurcation without stenosis. Mild eccentric soft plaque at the left bifurcation without hemodynamically significant stenosis. Left ICA widely patent distally to the skull  base without stenosis, dissection or occlusion. Vertebral arteries: Both vertebral arteries arise from the subclavian arteries. Dominant right vertebral artery widely patent within the neck. Focal plaque at the origin of the left vertebral artery without hemodynamically significant stenosis. Left vertebral artery otherwise widely patent without stenosis or other abnormality. Skeleton: No acute osseous abnormality. No discrete lytic or blastic osseous lesions. Other neck: No other acute soft tissue abnormality within the neck. Few subcentimeter hypodense nodules noted within the thyroid, which do not require follow-up imaging given size and patient age. No other mass lesion or adenopathy. Upper chest: Visualized upper chest demonstrates no acute finding. Partially visualized lungs are grossly clear. Review of the MIP images confirms the above findings CTA HEAD FINDINGS Anterior circulation: Petrous, cavernous, and supraclinoid ICAs widely patent without flow-limiting stenosis. A1 segments widely patent. Normal anterior communicating artery complex. Anterior cerebral arteries widely patent to their distal aspects. No M1 stenosis or occlusion. Normal MCA bifurcations. Distal MCA branches well perfused and symmetric. Posterior circulation: Both vertebral arteries widely patent to the vertebrobasilar junction. Posterior inferior cerebral arteries patent bilaterally. Basilar widely patent to its distal aspect. Superior cerebral arteries and posterior cerebral arteries well perfused and widely patent. Venous sinuses: Grossly patent allowing for timing of the  contrast bolus. Anatomic variants: None significant.  No intracranial aneurysm. Review of the MIP images confirms the above findings IMPRESSION: 1. Negative CTA of the head and neck. No large vessel occlusion or other acute vascular abnormality. 2. Mild atheromatous change about the carotid bifurcations without significant stenosis. No hemodynamically significant or  correctable stenosis seen about the major arterial vasculature of the head and neck. Electronically Signed   By: Jeannine Boga M.D.   On: 02/13/2019 00:24   MR BRAIN WO CONTRAST  Result Date: 02/13/2019 CLINICAL DATA:  Initial evaluation for acute left-sided weakness. EXAM: MRI HEAD WITHOUT CONTRAST TECHNIQUE: Multiplanar, multiecho pulse sequences of the brain and surrounding structures were obtained without intravenous contrast. COMPARISON:  Prior CT and CTA from 02/12/2019. FINDINGS: Brain: Examination technically limited by motion artifact. Additionally, mild localized susceptibility artifact present at the left frontal calvarium. Cerebral volume within normal limits for patient age. No focal parenchymal signal abnormality identified. No abnormal foci of restricted diffusion to suggest acute or subacute ischemia. Gray-white matter differentiation well maintained. No encephalomalacia to suggest chronic infarction. No foci of susceptibility artifact to suggest acute or chronic intracranial hemorrhage. No mass lesion, midline shift or mass effect. No hydrocephalus. No extra-axial fluid collection. Major dural sinuses are grossly patent. Pituitary gland and suprasellar region are normal. Midline structures intact and normal. Vascular: Major intracranial vascular flow voids well maintained and normal in appearance. Skull and upper cervical spine: Craniocervical junction normal. Visualized upper cervical spine within normal limits. Bone marrow signal intensity normal. No scalp soft tissue abnormality. Sinuses/Orbits: Globes and orbital soft tissues within normal limits. Paranasal sinuses are clear. No mastoid effusion. Inner ear structures normal. Other: None. IMPRESSION: Negative brain MRI. No acute intracranial infarct or other abnormality identified. Electronically Signed   By: Jeannine Boga M.D.   On: 02/13/2019 04:17   ECHOCARDIOGRAM COMPLETE  Result Date: 02/13/2019   ECHOCARDIOGRAM  REPORT   Patient Name:   [facility] Date of Exam: 02/13/2019 Medical Rec #:  [id number]             Height:       66.0 in Accession #:    [id number]            Weight:       165.0 lb Date of Birth:  [birth year]            BSA:          1.84 m Patient Age:    29 years              BP:           124/81 mmHg Patient Gender: F                     HR:           71 bpm. Exam Location:  [facility] Procedure: 2D Echo Indications:     TIA 435.9/G45.9  History:         Patient has no prior history of Echocardiogram examinations.                  Risk Factors:Dyslipidemia.  Sonographer:     Avanell Shackleton Referring Phys:  [id number] [place] Diagnosing Phys: Skeet Latch MD IMPRESSIONS  1. Left ventricular ejection fraction, by visual estimation, is 60 to 65%. The left ventricle has normal function. Left ventricular septal wall thickness was normal. Normal left ventricular posterior wall thickness. There is no left ventricular hypertrophy.  2.  The left ventricle has no regional wall motion abnormalities.  3. Global right ventricle has normal systolic function.The right ventricular size is normal. No increase in right ventricular wall thickness.  4. Left atrial size was normal.  5. Right atrial size was normal.  6. The mitral valve is normal in structure. No evidence of mitral valve regurgitation. No evidence of mitral stenosis.  7. The tricuspid valve is normal in structure. Tricuspid valve regurgitation is mild.  8. The aortic valve is tricuspid. Aortic valve regurgitation is not visualized. No evidence of aortic valve sclerosis or stenosis.  9. The pulmonic valve was normal in structure. Pulmonic valve regurgitation is trivial. 10. Normal pulmonary artery systolic pressure. 11. The inferior vena cava is normal in size with greater than 50% respiratory variability, suggesting right atrial pressure of 3 mmHg. FINDINGS  Left Ventricle: Left ventricular ejection fraction, by visual estimation, is 60 to 65%. The left  ventricle has normal function. The left ventricle has no regional wall motion abnormalities. Normal left ventricular posterior wall thickness. There is no left ventricular hypertrophy. Left ventricular diastolic parameters were normal. Normal left atrial pressure. Right Ventricle: The right ventricular size is normal. No increase in right ventricular wall thickness. Global RV systolic function is has normal systolic function. The tricuspid regurgitant velocity is 2.02 m/s, and with an assumed right atrial pressure  of 3 mmHg, the estimated right ventricular systolic pressure is normal at 19.2 mmHg. Left Atrium: Left atrial size was normal in size. Right Atrium: Right atrial size was normal in size Pericardium: There is no evidence of pericardial effusion. Mitral Valve: The mitral valve is normal in structure. No evidence of mitral valve regurgitation. No evidence of mitral valve stenosis by observation. Tricuspid Valve: The tricuspid valve is normal in structure. Tricuspid valve regurgitation is mild. Aortic Valve: The aortic valve is tricuspid. Aortic valve regurgitation is not visualized. The aortic valve is structurally normal, with no evidence of sclerosis or stenosis. Pulmonic Valve: The pulmonic valve was normal in structure. Pulmonic valve regurgitation is trivial. Pulmonic regurgitation is trivial. Aorta: The aortic root, ascending aorta and aortic arch are all structurally normal, with no evidence of dilitation or obstruction. Venous: The inferior vena cava is normal in size with greater than 50% respiratory variability, suggesting right atrial pressure of 3 mmHg. IAS/Shunts: No atrial level shunt detected by color flow Doppler. There is no evidence of a patent foramen ovale. No ventricular septal defect is seen or detected. There is no evidence of an atrial septal defect.  LEFT VENTRICLE PLAX 2D LVIDd:         4.33 cm  Diastology LVIDs:         2.74 cm  LV e' lateral:   10.40 cm/s LV PW:         0.95 cm   LV E/e' lateral: 9.1 LV IVS:        0.99 cm  LV e' medial:    7.07 cm/s LVOT diam:     2.20 cm  LV E/e' medial:  13.5 LV SV:         56 ml LV SV Index:   29.98 LVOT Area:     3.80 cm  RIGHT VENTRICLE             IVC RV S prime:     15.30 cm/s  IVC diam: 1.00 cm LEFT ATRIUM           Index       RIGHT ATRIUM  Index LA diam:      3.70 cm 2.01 cm/m  RA Area:     12.60 cm LA Vol (A4C): 40.4 ml 21.92 ml/m RA Volume:   25.30 ml  13.73 ml/m   AORTA Ao Root diam: 3.20 cm MITRAL VALVE                        TRICUSPID VALVE MV Area (PHT): 3.60 cm             TR Peak grad:   16.2 mmHg MV PHT:        61.19 msec           TR Vmax:        233.00 cm/s MV Decel Time: 211 msec MV E velocity: 95.10 cm/s 103 cm/s  SHUNTS MV A velocity: 78.60 cm/s 70.3 cm/s Systemic Diam: 2.20 cm MV E/A ratio:  1.21       1.5  Skeet Latch MD Electronically signed by Skeet Latch MD Signature Date/Time: 02/13/2019/2:36:56 PM    Final    CT HEAD CODE STROKE WO CONTRAST  Result Date: 02/12/2019 CLINICAL DATA:  Code stroke. Left facial and arm numbness. Hypertension. EXAM: CT HEAD WITHOUT CONTRAST TECHNIQUE: Contiguous axial images were obtained from the base of the skull through the vertex without intravenous contrast. COMPARISON:  Head CT 03/21/2008 FINDINGS: Brain: There is no evidence of acute infarct, intracranial hemorrhage, mass, midline shift, or extra-axial fluid collection. The ventricles and sulci are normal. Vascular: No hyperdense vessel. Skull: No fracture or focal osseous lesion. Sinuses/Orbits: Visualized paranasal sinuses and mastoid air cells are clear. Visualized orbits are unremarkable. Other: None. ASPECTS [facility] Stroke Program Early CT Score) - Ganglionic level infarction (caudate, lentiform nuclei, internal capsule, insula, M1-M3 cortex): 7 - Supraganglionic infarction (M4-M6 cortex): 3 Total score (0-10 with 10 being normal): 10 IMPRESSION: 1. Negative head CT. 2. ASPECTS is 10. These results were  called by telephone at the time of interpretation on 02/12/2019 at 9:25 pm to Dr. Nance Pear , who verbally acknowledged these results. Electronically Signed   By: Logan Bores M.D.   On: 02/12/2019 21:32     CODE STATUS:     Code Status Orders  (From admission, onward)         Start     Ordered   02/13/19 0000  Full code  Continuous     02/13/19 0003        Code Status History    This patient has a current code status but no historical code status.   Advance Care Planning Activity       TOTAL TIME TAKING CARE OF THIS PATIENT: *40* minutes.    Fritzi Mandes M.D on 02/14/2019 at 11:29 AM  Between 7am to 6pm - Pager - [phone] After 6pm go to [email or web address] - password [email or web address]  [facility]    CC: Primary care physician; Pleas Koch, NP

## 2019-02-14 NOTE — Discharge Instructions (Signed)
Keep log of blood pressure at home °

## 2019-02-15 ENCOUNTER — Telehealth: Payer: Self-pay | Admitting: Primary Care

## 2019-02-15 NOTE — Telephone Encounter (Signed)
Patient stated she needed a follow up 1 week after hospital visit which was the 18th,.   You are completely full on the 31st and patient is not available this week to come in

## 2019-02-15 NOTE — Telephone Encounter (Signed)
Patient scheduled a hospital follow up on 1/4  She stated that the hospital did not give her anything stating she needed to stay out of work so she wanted to make sure it was okay to go back before she comes in the office for her visit

## 2019-02-15 NOTE — Telephone Encounter (Signed)
Why is she scheduled so far out for follow up? Can I see her this week?

## 2019-02-16 NOTE — Telephone Encounter (Signed)
Called to discuss with patient. Left voicemail asking her to call office.

## 2019-02-16 NOTE — Telephone Encounter (Signed)
I can't clear her to return to work until she's evaluated. I'm happy to see her anytime this week if she's available, otherwise I'll see her on the 4th.

## 2019-02-17 ENCOUNTER — Encounter: Payer: Self-pay | Admitting: Primary Care

## 2019-02-17 ENCOUNTER — Ambulatory Visit (INDEPENDENT_AMBULATORY_CARE_PROVIDER_SITE_OTHER): Payer: 59 | Admitting: Primary Care

## 2019-02-17 ENCOUNTER — Other Ambulatory Visit: Payer: Self-pay

## 2019-02-17 VITALS — BP 124/82 | HR 82 | Temp 96.3°F | Ht 66.0 in | Wt 165.5 lb

## 2019-02-17 DIAGNOSIS — M542 Cervicalgia: Secondary | ICD-10-CM

## 2019-02-17 DIAGNOSIS — I10 Essential (primary) hypertension: Secondary | ICD-10-CM | POA: Diagnosis not present

## 2019-02-17 DIAGNOSIS — R531 Weakness: Secondary | ICD-10-CM

## 2019-02-17 DIAGNOSIS — G459 Transient cerebral ischemic attack, unspecified: Secondary | ICD-10-CM | POA: Diagnosis not present

## 2019-02-17 DIAGNOSIS — E785 Hyperlipidemia, unspecified: Secondary | ICD-10-CM

## 2019-02-17 DIAGNOSIS — K219 Gastro-esophageal reflux disease without esophagitis: Secondary | ICD-10-CM

## 2019-02-17 DIAGNOSIS — G8929 Other chronic pain: Secondary | ICD-10-CM | POA: Insufficient documentation

## 2019-02-17 DIAGNOSIS — R200 Anesthesia of skin: Secondary | ICD-10-CM

## 2019-02-17 DIAGNOSIS — R202 Paresthesia of skin: Secondary | ICD-10-CM

## 2019-02-17 MED ORDER — CYCLOBENZAPRINE HCL 10 MG PO TABS: 10.0000 mg | ORAL_TABLET | Freq: Three times a day (TID) | ORAL | 0 refills | Status: DC | PRN

## 2019-02-17 NOTE — Assessment & Plan Note (Signed)
Increased aggravation since hospital stay, this could be secondary to aspirin. Continue pantoprazole. Continue to monitor.

## 2019-02-17 NOTE — Assessment & Plan Note (Signed)
Acute since November 2020, exacerbated by recent hospital visit. Rx for cyclobenzaprine refill provided.

## 2019-02-17 NOTE — Progress Notes (Signed)
Subjective:    Patient ID: Ashley [name], female    DOB: Jul 25, 1965, 53 y.o.   MRN: [id number]  HPI  Ashley [name] is a 53 year old female with a history of TIA, hypertension, hyperlipidemia who presents today for hospital follow up.  She presented to [facility] ED on 02/13/19 with a chief complaint of left sided weakness (from face down to lower extremity) that began four hours prior. Also with numbness. Symptoms had starting improving upon arrival. Code stroke was called, neurology consulted via telemedicine and recommended TPA for which the patient declined. She was noted to be hypertensive so she was treated with IV labetalol and admitted for further evaluation.  During her hospital stay she underwent CTA neck (negative) MRI of brain (negative), 2D echo (LVEF of 50-55%, no abnormality). Neurology consulted who initiated statin therapy for LDL goal of <70 (simvastatin 20 mg), aspirin, clopidogrel 75 mg x 3 weeks. Given hypertensive urgency she was initiated on amlodipine 5 mg and was told to follow up with PCP.  Since her hospital visit she's compliant to all medications. She denies dizziness, visual changes, chest pain. She does have some numbness to left elbow and lower extremity. Also increase in GERD symptoms. She continues to experience neck pain that was aggravated after laying on stretchers and hospital beds during her recent stay. She was told that she would be connected with a neurologist but has yet to hear from anyone. She is also needing clearance to return to work.  BP Readings from Last 3 Encounters:  02/17/19 124/82  02/14/19 118/80  02/01/19 140/86     Review of Systems  Eyes: Negative for visual disturbance.  Respiratory: Negative for shortness of breath.   Cardiovascular: Negative for chest pain.  Neurological: Positive for numbness. Negative for dizziness and headaches.       Past Medical History:  Diagnosis Date  . Anemia   . Bronchitis    2 years ago   .  [place] arthritis 2019   base of left thumb   . GERD (gastroesophageal reflux disease)    diet controlled , no meds  . Hydrosalpinx    bilateral   . Hyperlipidemia    recent dx - currently diet control - no meds  . Hypothyroid 2003   radio active iodine - no meds  . SVD (spontaneous vaginal delivery)    x 1  . Vitamin B 12 deficiency      Social History   Socioeconomic History  . Marital status: Single    Spouse name: Not on file  . Number of children: Not on file  . Years of education: Not on file  . Highest education level: Not on file  Occupational History  . Not on file  Tobacco Use  . Smoking status: Never Smoker  . Smokeless tobacco: Never Used  Substance and Sexual Activity  . Alcohol use: No  . Drug use: No  . Sexual activity: Yes    Partners: Male    Birth control/protection: Surgical    Comment: hysterectomy.  Other Topics Concern  . Not on file  Social History Narrative   Single.   3 children.   Works at [employer] as a Designer, multimedia.   Enjoys relaxing, exercise, walking her dog.    Social Determinants of Health   Financial Resource Strain:   . Difficulty of Paying Living Expenses: Not on file  Food Insecurity:   . Worried About Charity fundraiser in the Last Year: Not on  file  . [place] in the Last Year: Not on file  Transportation Needs:   . Lack of Transportation (Medical): Not on file  . Lack of Transportation (Non-Medical): Not on file  Physical Activity:   . Days of Exercise per Week: Not on file  . Minutes of Exercise per Session: Not on file  Stress:   . Feeling of Stress : Not on file  Social Connections:   . Frequency of Communication with Friends and Family: Not on file  . Frequency of Social Gatherings with Friends and Family: Not on file  . Attends Religious Services: Not on file  . Active Member of Clubs or Organizations: Not on file  . Attends Archivist Meetings: Not on file  . Marital Status: Not on file  Intimate Partner  Violence:   . Fear of Current or Ex-Partner: Not on file  . Emotionally Abused: Not on file  . Physically Abused: Not on file  . Sexually Abused: Not on file    Past Surgical History:  Procedure Laterality Date  . CESAREAN SECTION     x 2  . CHOLECYSTECTOMY  2008  . DILATION AND CURETTAGE OF UTERUS     X 2  . LAPAROSCOPIC BILATERAL SALPINGECTOMY Bilateral 07/29/2017   Procedure: LAPAROSCOPIC LEFT SALPINGOOPHRECTOMY: Partial right Salpingectomy;  Surgeon: Lavonia Drafts, MD;  Location: [place];  Service: Gynecology;  Laterality: Bilateral;  . SHOULDER SURGERY  2016   arthroscopic  . TOTAL ABDOMINAL HYSTERECTOMY  2006   Fibroids, benign pathology; partial hysterectomy per patient   . TUBAL LIGATION    . WISDOM TOOTH EXTRACTION     at age 54 yr  . WRIST ARTHROSCOPY Right 2007    Family History  Problem Relation Age of Onset  . Hypertension Mother   . Breast cancer Maternal Aunt   . Hypertension Maternal Grandmother   . Heart failure Maternal Grandmother   . Lung cancer Maternal Grandmother   . Colon cancer Neg Hx   . Colon polyps Neg Hx   . Rectal cancer Neg Hx   . Stomach cancer Neg Hx     No Known Allergies  Current Outpatient Medications on File Prior to Visit  Medication Sig Dispense Refill  . amLODipine (NORVASC) 5 MG tablet Take 1 tablet (5 mg total) by mouth daily. 30 tablet 1  . aspirin 81 MG EC tablet Take 1 tablet (81 mg total) by mouth daily. Swallow whole. 30 tablet 12  . cetirizine (ZYRTEC) 10 MG tablet Take 1 tablet (10 mg total) by mouth daily. For allergies. 90 tablet 0  . clopidogrel (PLAVIX) 75 MG tablet Take 1 tablet (75 mg total) by mouth daily. 20 tablet 0  . fluticasone (FLONASE) 50 MCG/ACT nasal spray Place 2 sprays into both nostrils daily. 16 g 2  . meloxicam (MOBIC) 15 MG tablet Take 1 tablet (15 mg total) by mouth daily as needed for pain. 30 tablet 0  . pantoprazole (PROTONIX) 20 MG tablet Take 1 tablet by mouth once  to twice daily for heartburn. 60 tablet 0  . simvastatin (ZOCOR) 20 MG tablet Take 1 tablet (20 mg total) by mouth daily at 6 PM. 30 tablet 1   No current facility-administered medications on file prior to visit.    BP 124/82   Pulse 82   Temp (!) 96.3 F (35.7 C) (Temporal)   Ht 5\' 6"  (1.676 m)   Wt 165 lb 8 oz (75.1 kg)   SpO2  100%   BMI 26.71 kg/m    Objective:   Physical Exam  Constitutional: She is oriented to person, place, and time. She appears well-nourished.  Eyes: EOM are normal.  Cardiovascular: Normal rate and regular rhythm.  Respiratory: Effort normal and breath sounds normal.  Musculoskeletal:        General: Normal range of motion.     Cervical back: Neck supple.     Comments: Ambulates well in clinic  Neurological: She is alert and oriented to person, place, and time. No cranial nerve deficit. Coordination normal.  Skin: Skin is warm and dry.  Psychiatric: She has a normal mood and affect.           Assessment & Plan:

## 2019-02-17 NOTE — Assessment & Plan Note (Signed)
LDL of 135 on recent labs, compliant to simvastatin. Repeat lipids in 4-6 weeks for LDL goal of <70.

## 2019-02-17 NOTE — Assessment & Plan Note (Signed)
Residual symptoms since hospital visit. Referral placed to neurology, may need EMG testing.

## 2019-02-17 NOTE — Assessment & Plan Note (Signed)
Compliant to amlodipine 5 mg, BP stable today. Continue current regimen.

## 2019-02-17 NOTE — Assessment & Plan Note (Addendum)
Recent hospital admission, work up grossly unremarkable.  Compliant to all prescribed treatment. BP under great control.  Repeat lipids in 4-6 weeks. Exam today unremarkable. Cleared to return to work.  Labs, imaging, notes reviewed.  Referral placed to neurology.  Follow up in 3 months.

## 2019-02-17 NOTE — Patient Instructions (Addendum)
Continue clopidogrel (Plavix) 75 mg daily for a total of three weeks. Continue aspirin.  Continue simvastatin for cholesterol. Continue amlodipine for blood pressure.  You will be contacted regarding your referral to neurology.  Please let us know if you have not been contacted within two weeks.   Please notify me if your symptoms change, otherwise you may return to work.  Schedule a lab only appointment for the last week of January for cholesterol check.   Please schedule a follow up appointment in 3 months for blood sugar, blood pressure check.  It was a pleasure to see you today!

## 2019-02-24 ENCOUNTER — Encounter: Payer: Self-pay | Admitting: Primary Care

## 2019-03-01 ENCOUNTER — Ambulatory Visit: Payer: Self-pay | Admitting: Primary Care

## 2019-03-01 ENCOUNTER — Ambulatory Visit: Payer: Self-pay | Admitting: Family Medicine

## 2019-03-01 ENCOUNTER — Other Ambulatory Visit: Payer: Self-pay

## 2019-03-01 ENCOUNTER — Ambulatory Visit (INDEPENDENT_AMBULATORY_CARE_PROVIDER_SITE_OTHER): Payer: 59 | Admitting: Internal Medicine

## 2019-03-01 ENCOUNTER — Encounter: Payer: Self-pay | Admitting: Internal Medicine

## 2019-03-01 VITALS — BP 134/84 | HR 95 | Temp 97.6°F | Wt 166.0 lb

## 2019-03-01 DIAGNOSIS — R3989 Other symptoms and signs involving the genitourinary system: Secondary | ICD-10-CM | POA: Diagnosis not present

## 2019-03-01 DIAGNOSIS — R82998 Other abnormal findings in urine: Secondary | ICD-10-CM

## 2019-03-01 DIAGNOSIS — R8290 Unspecified abnormal findings in urine: Secondary | ICD-10-CM | POA: Diagnosis not present

## 2019-03-01 LAB — POC URINALSYSI DIPSTICK (AUTOMATED)
Bilirubin, UA: NEGATIVE
Blood, UA: NEGATIVE
Glucose, UA: NEGATIVE
Ketones, UA: NEGATIVE
Nitrite, UA: NEGATIVE
Protein, UA: NEGATIVE
Spec Grav, UA: 1.015 (ref 1.010–1.025)
Urobilinogen, UA: 0.2 E.U./dL
pH, UA: 5.5 (ref 5.0–8.0)

## 2019-03-01 NOTE — Progress Notes (Signed)
HPI  Pt presents to the clinic today with c/o bladder pressure, dark urine and urine odor. This started 1 week ago. She denies urgency, frequency, dysuria or blood in her urine. She denies vaginal discharge, irritation, odor or abnormal bleeding. She denies fever, chills, nausea, vomiting or low back pain. She has not taken anything OTC for this.    Review of Systems  Past Medical History:  Diagnosis Date  . Anemia   . Bronchitis    2 years ago   . [place] arthritis 2019   base of left thumb   . GERD (gastroesophageal reflux disease)    diet controlled , no meds  . Hydrosalpinx    bilateral   . Hyperlipidemia    recent dx - currently diet control - no meds  . Hypothyroid 2003   radio active iodine - no meds  . SVD (spontaneous vaginal delivery)    x 1  . Vitamin B 12 deficiency     Family History  Problem Relation Age of Onset  . Hypertension Mother   . Breast cancer Maternal Aunt   . Hypertension Maternal Grandmother   . Heart failure Maternal Grandmother   . Lung cancer Maternal Grandmother   . Colon cancer Neg Hx   . Colon polyps Neg Hx   . Rectal cancer Neg Hx   . Stomach cancer Neg Hx     Social History   Socioeconomic History  . Marital status: Single    Spouse name: Not on file  . Number of children: Not on file  . Years of education: Not on file  . Highest education level: Not on file  Occupational History  . Not on file  Tobacco Use  . Smoking status: Never Smoker  . Smokeless tobacco: Never Used  Substance and Sexual Activity  . Alcohol use: No  . Drug use: No  . Sexual activity: Yes    Partners: Male    Birth control/protection: Surgical    Comment: hysterectomy.  Other Topics Concern  . Not on file  Social History Narrative   Single.   3 children.   Works at [employer] as a Designer, multimedia.   Enjoys relaxing, exercise, walking her dog.    Social Determinants of Health   Financial Resource Strain:   . Difficulty of Paying Living Expenses: Not on file   Food Insecurity:   . Worried About Charity fundraiser in the Last Year: Not on file  . Ran Out of Food in the Last Year: Not on file  Transportation Needs:   . Lack of Transportation (Medical): Not on file  . Lack of Transportation (Non-Medical): Not on file  Physical Activity:   . Days of Exercise per Week: Not on file  . Minutes of Exercise per Session: Not on file  Stress:   . Feeling of Stress : Not on file  Social Connections:   . Frequency of Communication with Friends and Family: Not on file  . Frequency of Social Gatherings with Friends and Family: Not on file  . Attends Religious Services: Not on file  . Active Member of Clubs or Organizations: Not on file  . Attends Archivist Meetings: Not on file  . Marital Status: Not on file  Intimate Partner Violence:   . Fear of Current or Ex-Partner: Not on file  . Emotionally Abused: Not on file  . Physically Abused: Not on file  . Sexually Abused: Not on file    No Known Allergies  Constitutional: Denies fever, malaise, fatigue, headache or abrupt weight changes.   GU: Pt reports bladder pressure, dark urine and urine odor. Denies urgency, frequency, dysuria, burning sensation, blood in urine, odor or discharge. Skin: Denies redness, rashes, lesions or ulcercations.   No other specific complaints in a complete review of systems (except as listed in HPI above).    Objective:   Physical Exam  BP 134/84   Pulse 95   Temp 97.6 F (36.4 C) (Temporal)   Wt 166 lb (75.3 kg)   SpO2 98%   BMI 26.79 kg/m   Wt Readings from Last 3 Encounters:  02/17/19 165 lb 8 oz (75.1 kg)  02/13/19 166 lb 7.2 oz (75.5 kg)  02/01/19 164 lb 12 oz (74.7 kg)    General: Appears her stated age, well developed, well nourished in NAD. Cardiovascular: Normal rate and rhythm. S1,S2 noted.   Pulmonary/Chest: Normal effort and positive vesicular breath sounds. No respiratory distress. No wheezes, rales or ronchi noted.  Abdomen:  Soft. Normal bowel sounds. No distention or masses noted.  Tender to palpation over the bladder area. No CVA tenderness.        Assessment & Plan:   Urine Odor, Dark Urine, Bladder Pressure  Urinalysis: trace leuks Will send urine culture Drink plenty of fluids  RTC as needed or if symptoms persist.  Webb Silversmith, NP This visit occurred during the SARS-CoV-2 public health emergency.  Safety protocols were in place, including screening questions prior to the visit, additional usage of staff PPE, and extensive cleaning of exam room while observing appropriate contact time as indicated for disinfecting solutions.

## 2019-03-01 NOTE — Addendum Note (Signed)
Addended by: Lurlean Nanny on: 03/01/2019 04:30 PM   Modules accepted: Orders

## 2019-03-01 NOTE — Patient Instructions (Signed)
Clean Catch Urine Collection The clean catch urine collection method is a way of collecting a urine sample for lab testing. Using a clean catch method reduces the chance that bacteria and other fluids from the genital area will be collected in the urine sample. Many tests may be performed on the urine sample to help you and your health care provider determine appropriate treatment for your condition. The collection method includes:  Cleaning your genital area.  Collecting midstream urine. It is important to catch the middle part of the urine flow.  Securing the sample for lab testing. Collecting a urine sample at home If you are asked to collect a urine sample at home, make sure you:  Use supplies and instructions that you received from the lab.  Collect urine only in the germ-free (sterile) cup that you received from the lab.  Do not let any toilet paper or stool (feces) get into the cup.  Refrigerate the sample until you can return it to the lab. Supplies needed: You will need the following supplies:  Soap and water.  Cleansing wipes.  Collection container. How to collect a clean catch urine sample  The clean catch collection method varies slightly for women, men, and infants. Females 1. Wash your hands with soap and water. 2. Place the cleansing wipes and collection container within reach. 3. Open the collection container and place the lid with the flat side down. Be careful not to touch the inside of the lid. 4. Spread your legs open and separate the inner folds of skin at the entrance of the vagina (labia minora). 5. Clean your genital area: ? Take the first cleansing wipe and wipe from front to back inside your labia. Throw the cleansing wipe away. ? Take the second cleansing wipe and clean around the middle area of your genitals. 6. Continue to hold your labial skin folds open while collecting the sample: ? Urinate a small amount into the toilet, then stop the  flow. ? Hold the collection container under your genital area. ? Urinate into the collection container until it is about half full, then stop. ? Set the collection container down. ? Let go of your labial folds and finish urinating into the toilet. ? Secure the lid onto the collection container. Avoid touching the inside of the lid and collection container. ? Wash your hands. 7. Label the collection container as directed. 8. If you are at home, keep the sample refrigerated until you take it to the lab. Males 1. Wash your hands with soap and water. 2. Place the cleansing wipes and collection container within reach. 3. Open the collection container and place the lid with the flat side down. Be careful not to touch the inside of the lid. 4. Clean your genital area: ? Take a cleansing wipe and clean all around the tip of your penis. Make sure the foreskin is pulled back away from the opening, if necessary. ? Throw the cleansing wipe away. 5. Collect the sample: ? Urinate a small amount into the toilet, then stop the flow. ? Hold the collection container close to the opening of your penis. ? Urinate into the collection container until it is about half full, then stop. ? Set the collection container down. ? Finish urinating into the toilet. ? Secure the lid onto the collection container. Avoid touching the inside of the lid and collection container. ? Wash your hands. 6. Label the collection container as directed. 7. If you are at home, keep the  sample refrigerated until you take it to the lab. Infants 1. Wash your hands with soap and water. 2. Open the collection bag. 3. Wash your infant's genital area with cleansing wipes. Do not apply any ointments or antiseptics immediately before cleansing. 4. Place the collection bag over the penis or labia. Attach the adhesive to your infant's skin. 5. Place a new diaper on your infant over the collection bag. 6. Wash your hands. 7. Monitor your  baby. Remove the collection bag after your infant has urinated. 8. You may need to transfer the urine into a collection container. 9. Label the collection container as directed. 10. If you are at home, keep the sample refrigerated until you take it to the lab. Summary  The clean catch urine collection method is a way of collecting a urine sample for lab testing.  Using a clean catch method reduces the chance that bacteria and other fluids from the genital area will be collected in the urine sample.  The clean catch collection method varies slightly for women, men, and infants. This information is not intended to replace advice given to you by your health care provider. Make sure you discuss any questions you have with your health care provider. Document Revised: 01/07/2017 Document Reviewed: 01/07/2017 Elsevier Patient Education  2020 [employer].

## 2019-03-02 DIAGNOSIS — B379 Candidiasis, unspecified: Secondary | ICD-10-CM

## 2019-03-03 LAB — URINE CULTURE
MICRO NUMBER:: 10003911
SPECIMEN QUALITY:: ADEQUATE

## 2019-03-03 MED ORDER — NITROFURANTOIN MONOHYD MACRO 100 MG PO CAPS: 100.0000 mg | ORAL_CAPSULE | Freq: Two times a day (BID) | ORAL | 0 refills | Status: DC

## 2019-03-03 NOTE — Addendum Note (Signed)
Addended by: Jearld Fenton on: 03/03/2019 02:17 PM   Modules accepted: Orders

## 2019-03-10 MED ORDER — FLUCONAZOLE 150 MG PO TABS: 150.0000 mg | ORAL_TABLET | Freq: Once | ORAL | 0 refills | Status: AC

## 2019-04-05 ENCOUNTER — Other Ambulatory Visit: Payer: Self-pay

## 2019-04-05 DIAGNOSIS — K219 Gastro-esophageal reflux disease without esophagitis: Secondary | ICD-10-CM

## 2019-04-05 MED ORDER — PANTOPRAZOLE SODIUM 20 MG PO TBEC: DELAYED_RELEASE_TABLET | ORAL | 0 refills | Status: DC

## 2019-04-05 NOTE — Telephone Encounter (Signed)
Noted, will discuss pantoprazole use during next visit.

## 2019-04-05 NOTE — Telephone Encounter (Signed)
Last prescribed on 10/06/2018 . Last appointment on 03/01/2019 with Rollene Fare (acute). No future appointment

## 2019-04-07 ENCOUNTER — Encounter: Payer: Self-pay | Admitting: Primary Care

## 2019-04-07 ENCOUNTER — Ambulatory Visit: Payer: 59 | Admitting: Primary Care

## 2019-04-07 ENCOUNTER — Other Ambulatory Visit: Payer: Self-pay

## 2019-04-07 VITALS — BP 122/78 | HR 84 | Temp 97.4°F | Ht 66.0 in | Wt 169.8 lb

## 2019-04-07 DIAGNOSIS — K5900 Constipation, unspecified: Secondary | ICD-10-CM | POA: Diagnosis not present

## 2019-04-07 DIAGNOSIS — R1084 Generalized abdominal pain: Secondary | ICD-10-CM

## 2019-04-07 DIAGNOSIS — E785 Hyperlipidemia, unspecified: Secondary | ICD-10-CM

## 2019-04-07 DIAGNOSIS — K219 Gastro-esophageal reflux disease without esophagitis: Secondary | ICD-10-CM | POA: Diagnosis not present

## 2019-04-07 HISTORY — DX: Constipation, unspecified: K59.00

## 2019-04-07 LAB — POC URINALSYSI DIPSTICK (AUTOMATED)
Bilirubin, UA: NEGATIVE
Blood, UA: NEGATIVE
Glucose, UA: NEGATIVE
Ketones, UA: NEGATIVE
Leukocytes, UA: NEGATIVE
Nitrite, UA: NEGATIVE
Protein, UA: NEGATIVE
Spec Grav, UA: 1.015 (ref 1.010–1.025)
Urobilinogen, UA: 0.2 E.U./dL
pH, UA: 7 (ref 5.0–8.0)

## 2019-04-07 LAB — LIPID PANEL
Cholesterol: 169 mg/dL (ref 0–200)
HDL: 62.4 mg/dL (ref >39.00)
LDL Cholesterol: 89 mg/dL (ref 0–99)
NonHDL: 106.36
Total CHOL/HDL Ratio: 3
Triglycerides: 88 mg/dL (ref 0.0–149.0)
VLDL: 17.6 mg/dL (ref 0.0–40.0)

## 2019-04-07 NOTE — Progress Notes (Signed)
Subjective:    Patient ID: Ashley [name], female    DOB: 14-Sep-1965, 54 y.o.   MRN: [id number]  HPI  This visit occurred during the SARS-CoV-2 public health emergency.  Safety protocols were in place, including screening questions prior to the visit, additional usage of staff PPE, and extensive cleaning of exam room while observing appropriate contact time as indicated for disinfecting solutions.   Ashley [name] is a 54 year old female with a history of TIA, hypertension, GERD, osteoarthritis, pelvic pain, urinary frequency.  She was originally evaluated on 03/01/19 by Webb Silversmith for symptoms of bladder pressure, dark urine, urine odor one week prior. Urinalysis with 1+ leuks, culture positive for E coli. She was treated with a five day Macrobid course.  She was evaluated by her neurologist last week, told her neurologist that symptoms of last UTI remained. Urine was checked in the office which was "positive" so she was prescribed a 7 day course of Augmentin.   Today she endorses compliance to her Augmentin. She has discomfort to her bilateral lateral abdomen, and bilateral upper abdomen. She has noticed constipation since initiation of medications from her stroke.   She's been trying to increase her dietary intake of Fiber. She recently started taking a stool softener. She's also used "Smoothe Move" tea with some improvement. She is having firm bowel movements every 2-3 days which isn't normal for her. She's been increasing her water intake.   She denies bloody stools.   Review of Systems  Constitutional: Negative for fever.  Gastrointestinal: Positive for abdominal pain and constipation.  Genitourinary: Negative for dysuria, flank pain and frequency.       Past Medical History:  Diagnosis Date  . Anemia   . Bronchitis    2 years ago   . [place] arthritis 2019   base of left thumb   . GERD (gastroesophageal reflux disease)    diet controlled , no meds  . Hydrosalpinx    bilateral   . Hyperlipidemia    recent dx - currently diet control - no meds  . Hypothyroid 2003   radio active iodine - no meds  . SVD (spontaneous vaginal delivery)    x 1  . Vitamin B 12 deficiency      Social History   Socioeconomic History  . Marital status: Single    Spouse name: Not on file  . Number of children: Not on file  . Years of education: Not on file  . Highest education level: Not on file  Occupational History  . Not on file  Tobacco Use  . Smoking status: Never Smoker  . Smokeless tobacco: Never Used  Substance and Sexual Activity  . Alcohol use: No  . Drug use: No  . Sexual activity: Yes    Partners: Male    Birth control/protection: Surgical    Comment: hysterectomy.  Other Topics Concern  . Not on file  Social History Narrative   Single.   3 children.   Works at [employer] as a Designer, multimedia.   Enjoys relaxing, exercise, walking her dog.    Social Determinants of Health   Financial Resource Strain:   . Difficulty of Paying Living Expenses: Not on file  Food Insecurity:   . Worried About Charity fundraiser in the Last Year: Not on file  . Ran Out of Food in the Last Year: Not on file  Transportation Needs:   . Lack of Transportation (Medical): Not on file  . Lack of  Transportation (Non-Medical): Not on file  Physical Activity:   . Days of Exercise per Week: Not on file  . Minutes of Exercise per Session: Not on file  Stress:   . Feeling of Stress : Not on file  Social Connections:   . Frequency of Communication with Friends and Family: Not on file  . Frequency of Social Gatherings with Friends and Family: Not on file  . Attends Religious Services: Not on file  . Active Member of Clubs or Organizations: Not on file  . Attends Archivist Meetings: Not on file  . Marital Status: Not on file  Intimate Partner Violence:   . Fear of Current or Ex-Partner: Not on file  . Emotionally Abused: Not on file  . Physically Abused: Not on file  .  Sexually Abused: Not on file    Past Surgical History:  Procedure Laterality Date  . CESAREAN SECTION     x 2  . CHOLECYSTECTOMY  2008  . DILATION AND CURETTAGE OF UTERUS     X 2  . LAPAROSCOPIC BILATERAL SALPINGECTOMY Bilateral 07/29/2017   Procedure: LAPAROSCOPIC LEFT SALPINGOOPHRECTOMY: Partial right Salpingectomy;  Surgeon: Lavonia Drafts, MD;  Location: [facility];  Service: Gynecology;  Laterality: Bilateral;  . SHOULDER SURGERY  2016   arthroscopic  . TOTAL ABDOMINAL HYSTERECTOMY  2006   Fibroids, benign pathology; partial hysterectomy per patient   . TUBAL LIGATION    . WISDOM TOOTH EXTRACTION     at age 66 yr  . WRIST ARTHROSCOPY Right 2007    Family History  Problem Relation Age of Onset  . Hypertension Mother   . Breast cancer Maternal Aunt   . Hypertension Maternal Grandmother   . Heart failure Maternal Grandmother   . Lung cancer Maternal Grandmother   . Colon cancer Neg Hx   . Colon polyps Neg Hx   . Rectal cancer Neg Hx   . Stomach cancer Neg Hx     No Known Allergies  Current Outpatient Medications on File Prior to Visit  Medication Sig Dispense Refill  . amLODipine (NORVASC) 5 MG tablet Take 1 tablet (5 mg total) by mouth daily. 30 tablet 1  . aspirin 81 MG EC tablet Take 1 tablet (81 mg total) by mouth daily. Swallow whole. 30 tablet 12  . cetirizine (ZYRTEC) 10 MG tablet Take 1 tablet (10 mg total) by mouth daily. For allergies. 90 tablet 0  . clopidogrel (PLAVIX) 75 MG tablet Take 1 tablet (75 mg total) by mouth daily. 20 tablet 0  . cyclobenzaprine (FLEXERIL) 10 MG tablet Take 1 tablet (10 mg total) by mouth 3 (three) times daily as needed. For neck pain. 20 tablet 0  . fluticasone (FLONASE) 50 MCG/ACT nasal spray Place 2 sprays into both nostrils daily. 16 g 2  . meloxicam (MOBIC) 15 MG tablet Take 1 tablet (15 mg total) by mouth daily as needed for pain. 30 tablet 0  . simvastatin (ZOCOR) 20 MG tablet Take 1 tablet (20 mg  total) by mouth daily at 6 PM. 30 tablet 1   No current facility-administered medications on file prior to visit.    BP 122/78   Pulse 84   Temp (!) 97.4 F (36.3 C) (Temporal)   Ht 5\' 6"  (1.676 m)   Wt 169 lb 12 oz (77 kg)   SpO2 100%   BMI 27.40 kg/m    Objective:   Physical Exam  Constitutional: She appears well-nourished.  Cardiovascular: Normal rate and regular  rhythm.  Respiratory: Effort normal and breath sounds normal.  GI: Soft. Normal appearance and bowel sounds are normal. There is abdominal tenderness in the right upper quadrant, epigastric area and left upper quadrant.    Musculoskeletal:     Cervical back: Neck supple.  Skin: Skin is warm and dry.  Psychiatric: She has a normal mood and affect.           Assessment & Plan:

## 2019-04-07 NOTE — Assessment & Plan Note (Addendum)
Repeat lipid panel pending. Compliant to simvastatin.  Goal LDL <70 given stroke history.

## 2019-04-07 NOTE — Assessment & Plan Note (Signed)
Chronic since stroke last year. Discussed continued intake of increased fiber and water, exercise, daily stool softener, laxative sparingly.  She will update.

## 2019-04-07 NOTE — Assessment & Plan Note (Addendum)
Suspect secondary to constipation as pain is located to the colon area bilaterally.  Encouraged use of daily stool softener, continue fiber and water increase, laxative only if needed.  UA today negative. Culture sent.

## 2019-04-07 NOTE — Assessment & Plan Note (Signed)
Stopped pantoprazole a few days ago, overall with mild breakthrough symptoms which is expected after abruptly stopping PPI.   Prefer to discontinue, have her use Tum's or famotidine for breakthrough symptoms. She will try this and update.

## 2019-04-07 NOTE — Patient Instructions (Signed)
Start docusate sodium (Colace) daily for bowels.  Continue to work on fiber and water intake.  Use the laxative if needed.  It was a pleasure to see you today!   Constipation, Adult Constipation is when a person has fewer bowel movements in a week than normal, has difficulty having a bowel movement, or has stools that are dry, hard, or larger than normal. Constipation may be caused by an underlying condition. It may become worse with age if a person takes certain medicines and does not take in enough fluids. Follow these instructions at home: Eating and drinking   Eat foods that have a lot of fiber, such as fresh fruits and vegetables, whole grains, and beans.  Limit foods that are high in fat, low in fiber, or overly processed, such as french fries, hamburgers, cookies, candies, and soda.  Drink enough fluid to keep your urine clear or pale yellow. General instructions  Exercise regularly or as told by your health care provider.  Go to the restroom when you have the urge to go. Do not hold it in.  Take over-the-counter and prescription medicines only as told by your health care provider. These include any fiber supplements.  Practice pelvic floor retraining exercises, such as deep breathing while relaxing the lower abdomen and pelvic floor relaxation during bowel movements.  Watch your condition for any changes.  Keep all follow-up visits as told by your health care provider. This is important. Contact a health care provider if:  You have pain that gets worse.  You have a fever.  You do not have a bowel movement after 4 days.  You vomit.  You are not hungry.  You lose weight.  You are bleeding from the anus.  You have thin, pencil-like stools. Get help right away if:  You have a fever and your symptoms suddenly get worse.  You leak stool or have blood in your stool.  Your abdomen is bloated.  You have severe pain in your abdomen.  You feel dizzy or you  faint. This information is not intended to replace advice given to you by your health care provider. Make sure you discuss any questions you have with your health care provider. Document Revised: 01/24/2017 Document Reviewed: 08/02/2015 Elsevier Patient Education  2020 [employer].

## 2019-04-08 LAB — URINE CULTURE
MICRO NUMBER:: 10137633
Result:: NO GROWTH
SPECIMEN QUALITY:: ADEQUATE

## 2019-04-09 DIAGNOSIS — E785 Hyperlipidemia, unspecified: Secondary | ICD-10-CM

## 2019-04-09 MED ORDER — ROSUVASTATIN CALCIUM 5 MG PO TABS: 5.0000 mg | ORAL_TABLET | Freq: Every evening | ORAL | 3 refills | Status: DC

## 2019-04-22 DIAGNOSIS — R200 Anesthesia of skin: Secondary | ICD-10-CM | POA: Insufficient documentation

## 2019-04-27 ENCOUNTER — Other Ambulatory Visit: Payer: Self-pay

## 2019-04-27 DIAGNOSIS — I10 Essential (primary) hypertension: Secondary | ICD-10-CM

## 2019-04-27 MED ORDER — AMLODIPINE BESYLATE 5 MG PO TABS: 5.0000 mg | ORAL_TABLET | Freq: Every day | ORAL | 2 refills | Status: DC

## 2019-04-27 NOTE — Telephone Encounter (Signed)
Noted, refill sent to pharmacy. 

## 2019-04-27 NOTE — Telephone Encounter (Signed)
Patient is requesting a refill on her Amlodipine 5mg . This was previously filled by Dr. Posey Pronto in the ER for a 30 day supply  - and she states that she is due for a refill and is now needing this to be refilled by And[facility]. She would like this sent in to the [facility] on [address].

## 2019-06-09 ENCOUNTER — Other Ambulatory Visit: Payer: Self-pay | Admitting: Primary Care

## 2019-06-09 DIAGNOSIS — R7303 Prediabetes: Secondary | ICD-10-CM

## 2019-06-09 DIAGNOSIS — I10 Essential (primary) hypertension: Secondary | ICD-10-CM

## 2019-06-14 ENCOUNTER — Other Ambulatory Visit: Payer: Self-pay

## 2019-06-14 ENCOUNTER — Other Ambulatory Visit (INDEPENDENT_AMBULATORY_CARE_PROVIDER_SITE_OTHER): Payer: 59

## 2019-06-14 DIAGNOSIS — I10 Essential (primary) hypertension: Secondary | ICD-10-CM

## 2019-06-14 DIAGNOSIS — R7303 Prediabetes: Secondary | ICD-10-CM | POA: Diagnosis not present

## 2019-06-14 LAB — COMPREHENSIVE METABOLIC PANEL
ALT: 10 U/L (ref 0–35)
AST: 19 U/L (ref 0–37)
Albumin: 4.1 g/dL (ref 3.5–5.2)
Alkaline Phosphatase: 84 U/L (ref 39–117)
BUN: 11 mg/dL (ref 6–23)
CO2: 30 mEq/L (ref 19–32)
Calcium: 9.8 mg/dL (ref 8.4–10.5)
Chloride: 104 mEq/L (ref 96–112)
Creatinine, Ser: 0.78 mg/dL (ref 0.40–1.20)
GFR: 93.02 mL/min (ref >60.00)
Glucose, Bld: 98 mg/dL (ref 70–99)
Potassium: 4.2 mEq/L (ref 3.5–5.1)
Sodium: 139 mEq/L (ref 135–145)
Total Bilirubin: 0.4 mg/dL (ref 0.2–1.2)
Total Protein: 7.2 g/dL (ref 6.0–8.3)

## 2019-06-14 LAB — HEMOGLOBIN A1C: Hgb A1c MFr Bld: 5.6 % (ref 4.6–6.5)

## 2019-06-23 ENCOUNTER — Other Ambulatory Visit: Payer: Self-pay

## 2019-06-23 ENCOUNTER — Encounter: Payer: Self-pay | Admitting: Primary Care

## 2019-06-23 ENCOUNTER — Ambulatory Visit (INDEPENDENT_AMBULATORY_CARE_PROVIDER_SITE_OTHER): Payer: 59 | Admitting: Primary Care

## 2019-06-23 VITALS — BP 124/84 | HR 79 | Temp 96.1°F | Ht 66.0 in | Wt 169.2 lb

## 2019-06-23 DIAGNOSIS — Z1231 Encounter for screening mammogram for malignant neoplasm of breast: Secondary | ICD-10-CM

## 2019-06-23 DIAGNOSIS — M159 Polyosteoarthritis, unspecified: Secondary | ICD-10-CM

## 2019-06-23 DIAGNOSIS — G459 Transient cerebral ischemic attack, unspecified: Secondary | ICD-10-CM | POA: Diagnosis not present

## 2019-06-23 DIAGNOSIS — H9313 Tinnitus, bilateral: Secondary | ICD-10-CM

## 2019-06-23 DIAGNOSIS — I10 Essential (primary) hypertension: Secondary | ICD-10-CM

## 2019-06-23 DIAGNOSIS — Z113 Encounter for screening for infections with a predominantly sexual mode of transmission: Secondary | ICD-10-CM | POA: Diagnosis not present

## 2019-06-23 DIAGNOSIS — K5900 Constipation, unspecified: Secondary | ICD-10-CM

## 2019-06-23 DIAGNOSIS — Z0001 Encounter for general adult medical examination with abnormal findings: Secondary | ICD-10-CM | POA: Insufficient documentation

## 2019-06-23 DIAGNOSIS — Z23 Encounter for immunization: Secondary | ICD-10-CM | POA: Diagnosis not present

## 2019-06-23 DIAGNOSIS — E785 Hyperlipidemia, unspecified: Secondary | ICD-10-CM

## 2019-06-23 DIAGNOSIS — Z0000 Encounter for general adult medical examination without abnormal findings: Secondary | ICD-10-CM

## 2019-06-23 DIAGNOSIS — M542 Cervicalgia: Secondary | ICD-10-CM

## 2019-06-23 DIAGNOSIS — J309 Allergic rhinitis, unspecified: Secondary | ICD-10-CM

## 2019-06-23 NOTE — Assessment & Plan Note (Signed)
Improved. Work on diet and exercise, water intake.

## 2019-06-23 NOTE — Assessment & Plan Note (Signed)
Tetanus and Shingrix due, provided today.  Mammogram due this Summer, orders placed. Colonoscopy UTD, due in 2029. Encouraged regular exercise and healthy diet. Exam today unremarkable. Labs reviewed and pending.

## 2019-06-23 NOTE — Assessment & Plan Note (Signed)
Improved, no longer using Meloxiam. Continue PRN cyclobenzaprine.

## 2019-06-23 NOTE — Assessment & Plan Note (Signed)
Overall stable, continue PRN Zyrtec.

## 2019-06-23 NOTE — Assessment & Plan Note (Signed)
Has been off of Crestor for one month, recommended she resume given history of TIA. She agrees.

## 2019-06-23 NOTE — Addendum Note (Signed)
Addended by: Jacqualin Combes on: 06/23/2019 11:34 AM   Modules accepted: Orders

## 2019-06-23 NOTE — Patient Instructions (Addendum)
Stop by the lab prior to leaving today. I will notify you of your results once received.   Start exercising. You should be getting 150 minutes of moderate intensity exercise weekly.  Continue to work on a healthy diet. Ensure you are consuming 64 ounces of water daily.  [facility] to schedule your mammogram.  It was a pleasure to see you today!    Preventive Care 38-55 Years Old, Female Preventive care refers to visits with your health care provider and lifestyle choices that can promote health and wellness. This includes:  A yearly physical exam. This may also be called an annual well check.  Regular dental visits and eye exams.  Immunizations.  Screening for certain conditions.  Healthy lifestyle choices, such as eating a healthy diet, getting regular exercise, not using drugs or products that contain nicotine and tobacco, and limiting alcohol use. What can I expect for my preventive care visit? Physical exam Your health care provider will check your:  Height and weight. This may be used to calculate body mass index (BMI), which tells if you are at a healthy weight.  Heart rate and blood pressure.  Skin for abnormal spots. Counseling Your health care provider may ask you questions about your:  Alcohol, tobacco, and drug use.  Emotional well-being.  Home and relationship well-being.  Sexual activity.  Eating habits.  Work and work Statistician.  Method of birth control.  Menstrual cycle.  Pregnancy history. What immunizations do I need?  Influenza (flu) vaccine  This is recommended every year. Tetanus, diphtheria, and pertussis (Tdap) vaccine  You may need a Td booster every 10 years. Varicella (chickenpox) vaccine  You may need this if you have not been vaccinated. Zoster (shingles) vaccine  You may need this after age 46. Measles, mumps, and rubella (MMR) vaccine  You may need at least one dose of MMR if you were born in 1957 or  later. You may also need a second dose. Pneumococcal conjugate (PCV13) vaccine  You may need this if you have certain conditions and were not previously vaccinated. Pneumococcal polysaccharide (PPSV23) vaccine  You may need one or two doses if you smoke cigarettes or if you have certain conditions. Meningococcal conjugate (MenACWY) vaccine  You may need this if you have certain conditions. Hepatitis A vaccine  You may need this if you have certain conditions or if you travel or work in places where you may be exposed to hepatitis A. Hepatitis B vaccine  You may need this if you have certain conditions or if you travel or work in places where you may be exposed to hepatitis B. Haemophilus influenzae type b (Hib) vaccine  You may need this if you have certain conditions. Human papillomavirus (HPV) vaccine  If recommended by your health care provider, you may need three doses over 6 months. You may receive vaccines as individual doses or as more than one vaccine together in one shot (combination vaccines). Talk with your health care provider about the risks and benefits of combination vaccines. What tests do I need? Blood tests  Lipid and cholesterol levels. These may be checked every 5 years, or more frequently if you are over 23 years old.  Hepatitis C test.  Hepatitis B test. Screening  Lung cancer screening. You may have this screening every year starting at age 10 if you have a 30-pack-year history of smoking and currently smoke or have quit within the past 15 years.  Colorectal cancer screening. All adults should  have this screening starting at age 78 and continuing until age 46. Your health care provider may recommend screening at age 43 if you are at increased risk. You will have tests every 1-10 years, depending on your results and the type of screening test.  Diabetes screening. This is done by checking your blood sugar (glucose) after you have not eaten for a while  (fasting). You may have this done every 1-3 years.  Mammogram. This may be done every 1-2 years. Talk with your health care provider about when you should start having regular mammograms. This may depend on whether you have a family history of breast cancer.  BRCA-related cancer screening. This may be done if you have a family history of breast, ovarian, tubal, or peritoneal cancers.  Pelvic exam and Pap test. This may be done every 3 years starting at age 9. Starting at age 79, this may be done every 5 years if you have a Pap test in combination with an HPV test. Other tests  Sexually transmitted disease (STD) testing.  Bone density scan. This is done to screen for osteoporosis. You may have this scan if you are at high risk for osteoporosis. Follow these instructions at home: Eating and drinking  Eat a diet that includes fresh fruits and vegetables, whole grains, lean protein, and low-fat dairy.  Take vitamin and mineral supplements as recommended by your health care provider.  Do not drink alcohol if: ? Your health care provider tells you not to drink. ? You are pregnant, may be pregnant, or are planning to become pregnant.  If you drink alcohol: ? Limit how much you have to 0-1 drink a day. ? Be aware of how much alcohol is in your drink. In the [place] one drink equals one 12 oz bottle of beer (355 mL), one 5 oz glass of wine (148 mL), or one 1 oz glass of hard liquor (44 mL). Lifestyle  Take daily care of your teeth and gums.  Stay active. Exercise for at least 30 minutes on 5 or more days each week.  Do not use any products that contain nicotine or tobacco, such as cigarettes, e-cigarettes, and chewing tobacco. If you need help quitting, ask your health care provider.  If you are sexually active, practice safe sex. Use a condom or other form of birth control (contraception) in order to prevent pregnancy and STIs (sexually transmitted infections).  If told by your health  care provider, take low-dose aspirin daily starting at age 52. What's next?  Visit your health care provider once a year for a well check visit.  Ask your health care provider how often you should have your eyes and teeth checked.  Stay up to date on all vaccines. This information is not intended to replace advice given to you by your health care provider. Make sure you discuss any questions you have with your health care provider. Document Revised: 10/23/2017 Document Reviewed: 10/23/2017 Elsevier Patient Education  2020 [employer].

## 2019-06-23 NOTE — Assessment & Plan Note (Signed)
Well controlled in the office today, continue amlodipine.

## 2019-06-23 NOTE — Assessment & Plan Note (Signed)
Improved, using cyclobenzaprine PRN. Continue same.

## 2019-06-23 NOTE — Progress Notes (Signed)
Subjective:    Patient ID: Ashley [name], female    DOB: 04/22/1965, 54 y.o.   MRN: [id number]  HPI  This visit occurred during the SARS-CoV-2 public health emergency.  Safety protocols were in place, including screening questions prior to the visit, additional usage of staff PPE, and extensive cleaning of exam room while observing appropriate contact time as indicated for disinfecting solutions.   Ashley [name] is a 54 year old female who presents today for complete physical.  Immunizations: -Tetanus: Completed in 2003, due today -Influenza: Completed last season -Shingles: Never completed  -Covid-19: Completed   Diet: She endorses a fair diet.  Exercise: Exercising last month, no recent exercise  Eye exam: Completed in 2021 Dental exam: Completes semi-annually   Pap Smear: Hysterectomy, partial with one remaining ovary Mammogram: Completed in June 2020 Colonoscopy: Completed in 2019, due in 2029  BP Readings from Last 3 Encounters:  06/23/19 124/84  04/07/19 122/78  03/01/19 134/84     Review of Systems  Constitutional: Negative for unexpected weight change.  HENT: Positive for tinnitus. Negative for rhinorrhea.   Respiratory: Negative for cough and shortness of breath.   Cardiovascular: Negative for chest pain.  Gastrointestinal: Negative for constipation and diarrhea.  Genitourinary: Positive for vaginal discharge. Negative for difficulty urinating.  Musculoskeletal: Negative for arthralgias.  Skin: Negative for rash.  Allergic/Immunologic: Negative for environmental allergies.  Neurological: Negative for dizziness, numbness and headaches.  Psychiatric/Behavioral: The patient is not nervous/anxious.        Past Medical History:  Diagnosis Date  . Anemia   . Bronchitis    2 years ago   . [place] arthritis 2019   base of left thumb   . GERD (gastroesophageal reflux disease)    diet controlled , no meds  . Hydrosalpinx    bilateral   . Hyperlipidemia     recent dx - currently diet control - no meds  . Hypothyroid 2003   radio active iodine - no meds  . SVD (spontaneous vaginal delivery)    x 1  . Vitamin B 12 deficiency      Social History   Socioeconomic History  . Marital status: Single    Spouse name: Not on file  . Number of children: Not on file  . Years of education: Not on file  . Highest education level: Not on file  Occupational History  . Not on file  Tobacco Use  . Smoking status: Never Smoker  . Smokeless tobacco: Never Used  Substance and Sexual Activity  . Alcohol use: No  . Drug use: No  . Sexual activity: Yes    Partners: Male    Birth control/protection: Surgical    Comment: hysterectomy.  Other Topics Concern  . Not on file  Social History Narrative   Single.   3 children.   Works at [employer] as a Designer, multimedia.   Enjoys relaxing, exercise, walking her dog.    Social Determinants of Health   Financial Resource Strain:   . Difficulty of Paying Living Expenses:   Food Insecurity:   . Worried About Charity fundraiser in the Last Year:   . Arboriculturist in the Last Year:   Transportation Needs:   . Film/video editor (Medical):   Marland Kitchen Lack of Transportation (Non-Medical):   Physical Activity:   . Days of Exercise per Week:   . Minutes of Exercise per Session:   Stress:   . Feeling of Stress :  Social Connections:   . Frequency of Communication with Friends and Family:   . Frequency of Social Gatherings with Friends and Family:   . Attends Religious Services:   . Active Member of Clubs or Organizations:   . Attends Archivist Meetings:   Marland Kitchen Marital Status:   Intimate Partner Violence:   . Fear of Current or Ex-Partner:   . Emotionally Abused:   Marland Kitchen Physically Abused:   . Sexually Abused:     Past Surgical History:  Procedure Laterality Date  . CESAREAN SECTION     x 2  . CHOLECYSTECTOMY  2008  . DILATION AND CURETTAGE OF UTERUS     X 2  . LAPAROSCOPIC BILATERAL SALPINGECTOMY  Bilateral 07/29/2017   Procedure: LAPAROSCOPIC LEFT SALPINGOOPHRECTOMY: Partial right Salpingectomy;  Surgeon: Lavonia Drafts, MD;  Location: [facility];  Service: Gynecology;  Laterality: Bilateral;  . SHOULDER SURGERY  2016   arthroscopic  . TOTAL ABDOMINAL HYSTERECTOMY  2006   Fibroids, benign pathology; partial hysterectomy per patient   . TUBAL LIGATION    . WISDOM TOOTH EXTRACTION     at age 42 yr  . WRIST ARTHROSCOPY Right 2007    Family History  Problem Relation Age of Onset  . Hypertension Mother   . Breast cancer Maternal Aunt   . Hypertension Maternal Grandmother   . Heart failure Maternal Grandmother   . Lung cancer Maternal Grandmother   . Colon cancer Neg Hx   . Colon polyps Neg Hx   . Rectal cancer Neg Hx   . Stomach cancer Neg Hx     No Known Allergies  Current Outpatient Medications on File Prior to Visit  Medication Sig Dispense Refill  . amLODipine (NORVASC) 5 MG tablet Take 1 tablet (5 mg total) by mouth daily. For blood pressure. 90 tablet 2  . aspirin 81 MG EC tablet Take 1 tablet (81 mg total) by mouth daily. Swallow whole. 30 tablet 12  . cetirizine (ZYRTEC) 10 MG tablet Take 1 tablet (10 mg total) by mouth daily. For allergies. 90 tablet 0  . cyclobenzaprine (FLEXERIL) 10 MG tablet Take 1 tablet (10 mg total) by mouth 3 (three) times daily as needed. For neck pain. 20 tablet 0  . fluticasone (FLONASE) 50 MCG/ACT nasal spray Place 2 sprays into both nostrils daily. 16 g 2  . rosuvastatin (CRESTOR) 5 MG tablet Take 1 tablet (5 mg total) by mouth every evening. For cholesterol. 90 tablet 3   No current facility-administered medications on file prior to visit.    BP 124/84   Pulse 79   Temp (!) 96.1 F (35.6 C) (Temporal)   Ht 5\' 6"  (1.676 m)   Wt 169 lb 4 oz (76.8 kg)   SpO2 99%   BMI 27.32 kg/m    Objective:   Physical Exam  Constitutional: She is oriented to person, place, and time. She appears well-nourished.  HENT:    Right Ear: Tympanic membrane and ear canal normal.  Left Ear: Tympanic membrane and ear canal normal.  Mouth/Throat: Oropharynx is clear and moist.  Eyes: Pupils are equal, round, and reactive to light. EOM are normal.  Cardiovascular: Normal rate and regular rhythm.  Respiratory: Effort normal and breath sounds normal.  GI: Soft. Bowel sounds are normal. There is no abdominal tenderness.  Musculoskeletal:        General: Normal range of motion.     Cervical back: Neck supple.  Neurological: She is alert and oriented to person,  place, and time. No cranial nerve deficit.  Reflex Scores:      Patellar reflexes are 2+ on the right side and 2+ on the left side. Skin: Skin is warm and dry.  Psychiatric: She has a normal mood and affect.           Assessment & Plan:

## 2019-06-23 NOTE — Assessment & Plan Note (Signed)
Asymptomatic. No longer on Plavix, stopped Crestor one month ago. Recommended to resume Crestor. Other labs unremarkable.

## 2019-06-23 NOTE — Assessment & Plan Note (Signed)
Following with ENT. Managed on prednisone courses.

## 2019-06-25 ENCOUNTER — Other Ambulatory Visit: Payer: Self-pay | Admitting: Primary Care

## 2019-06-25 DIAGNOSIS — T3695XA Adverse effect of unspecified systemic antibiotic, initial encounter: Secondary | ICD-10-CM

## 2019-06-25 DIAGNOSIS — A609 Anogenital herpesviral infection, unspecified: Secondary | ICD-10-CM | POA: Insufficient documentation

## 2019-06-25 DIAGNOSIS — B379 Candidiasis, unspecified: Secondary | ICD-10-CM

## 2019-06-25 MED ORDER — FLUCONAZOLE 150 MG PO TABS: ORAL_TABLET | ORAL | 0 refills | Status: DC

## 2019-06-26 LAB — RPR: RPR Ser Ql: NONREACTIVE

## 2019-06-26 LAB — WET PREP BY MOLECULAR PROBE
Candida species: DETECTED — AB
MICRO NUMBER:: 10416035
SPECIMEN QUALITY:: ADEQUATE
Trichomonas vaginosis: NOT DETECTED

## 2019-06-26 LAB — HSV(HERPES SIMPLEX VRS) I + II AB-IGG
HAV 1 IGG,TYPE SPECIFIC AB: 5.1 index — ABNORMAL HIGH
HSV 2 IGG,TYPE SPECIFIC AB: >23 index — ABNORMAL HIGH

## 2019-06-26 LAB — HIV ANTIBODY (ROUTINE TESTING W REFLEX): HIV 1&2 Ab, 4th Generation: NONREACTIVE

## 2019-06-26 LAB — HSV 1/2 AB (IGM), IFA W/RFLX TITER
HSV 1 IgM Screen: NEGATIVE
HSV 2 IgM Screen: NEGATIVE

## 2019-06-26 LAB — C. TRACHOMATIS/N. GONORRHOEAE RNA
C. trachomatis RNA, TMA: NOT DETECTED
N. gonorrhoeae RNA, TMA: NOT DETECTED

## 2019-06-26 LAB — HEPATITIS C ANTIBODY
Hepatitis C Ab: NONREACTIVE
SIGNAL TO CUT-OFF: 0.01 (ref <1.00)

## 2019-07-01 ENCOUNTER — Encounter: Payer: Self-pay | Admitting: Obstetrics & Gynecology

## 2019-07-01 ENCOUNTER — Other Ambulatory Visit: Payer: Self-pay

## 2019-07-01 ENCOUNTER — Ambulatory Visit (INDEPENDENT_AMBULATORY_CARE_PROVIDER_SITE_OTHER): Payer: 59 | Admitting: Obstetrics & Gynecology

## 2019-07-01 VITALS — BP 142/86 | HR 80 | Wt 169.0 lb

## 2019-07-01 DIAGNOSIS — R894 Abnormal immunological findings in specimens from other organs, systems and tissues: Secondary | ICD-10-CM

## 2019-07-01 DIAGNOSIS — N760 Acute vaginitis: Secondary | ICD-10-CM | POA: Diagnosis not present

## 2019-07-01 DIAGNOSIS — B373 Candidiasis of vulva and vagina: Secondary | ICD-10-CM | POA: Diagnosis not present

## 2019-07-01 DIAGNOSIS — R1030 Lower abdominal pain, unspecified: Secondary | ICD-10-CM

## 2019-07-01 DIAGNOSIS — B9689 Other specified bacterial agents as the cause of diseases classified elsewhere: Secondary | ICD-10-CM | POA: Diagnosis not present

## 2019-07-01 DIAGNOSIS — B3731 Acute candidiasis of vulva and vagina: Secondary | ICD-10-CM

## 2019-07-01 MED ORDER — FLUCONAZOLE 150 MG PO TABS: 150.0000 mg | ORAL_TABLET | ORAL | 7 refills | Status: DC

## 2019-07-01 MED ORDER — METRONIDAZOLE 500 MG PO TABS: 500.0000 mg | ORAL_TABLET | Freq: Two times a day (BID) | ORAL | 7 refills | Status: DC

## 2019-07-01 NOTE — Progress Notes (Signed)
GYNECOLOGY OFFICE VISIT NOTE  History:   Ashley [name] is a 54 y.o. [id number] here today for discussion of recent results done during her annual exam with her PCP on 06/23/19. She reports chronic suprapubic pain, no dysuria no fevers,, She denies any current abnormal vaginal discharge, bleeding or other concerns.    Past Medical History:  Diagnosis Date  . Anemia   . Bronchitis    2 years ago   . [place] arthritis 2019   base of left thumb   . GERD (gastroesophageal reflux disease)    diet controlled , no meds  . Hydrosalpinx    bilateral   . Hyperlipidemia    recent dx - currently diet control - no meds  . Hypothyroid 2003   radio active iodine - no meds  . Left-sided weakness 02/12/2019  . SVD (spontaneous vaginal delivery)    x 1  . Urinary frequency 11/30/2018  . Vitamin B 12 deficiency     Past Surgical History:  Procedure Laterality Date  . CESAREAN SECTION     x 2  . CHOLECYSTECTOMY  2008  . DILATION AND CURETTAGE OF UTERUS     X 2  . LAPAROSCOPIC BILATERAL SALPINGECTOMY Bilateral 07/29/2017   Procedure: LAPAROSCOPIC LEFT SALPINGOOPHRECTOMY: Partial right Salpingectomy;  Surgeon: Lavonia Drafts, MD;  Location: [facility];  Service: Gynecology;  Laterality: Bilateral;  . SHOULDER SURGERY  2016   arthroscopic  . TOTAL ABDOMINAL HYSTERECTOMY  2006   Fibroids, benign pathology; partial hysterectomy per patient   . TUBAL LIGATION    . WISDOM TOOTH EXTRACTION     at age 38 yr  . WRIST ARTHROSCOPY Right 2007    The following portions of the patient's history were reviewed and updated as appropriate: allergies, current medications, past family history, past medical history, past social history, past surgical history and problem list.    Review of Systems:  Pertinent items noted in HPI and remainder of comprehensive ROS otherwise negative.  Physical Exam:  BP (!) 142/86   Pulse 80   Wt 169 lb (76.7 kg)   BMI 27.28 kg/m   CONSTITUTIONAL: Well-developed, well-nourished female in no acute distress.  HEENT:  Normocephalic, atraumatic. External right and left ear normal. No scleral icterus.  NECK: Normal range of motion, supple, no masses noted on observation SKIN: No rash noted. Not diaphoretic. No erythema. No pallor. MUSCULOSKELETAL: Normal range of motion. No edema noted. NEUROLOGIC: Alert and oriented to person, place, and time. Normal muscle tone coordination. No cranial nerve deficit noted. PSYCHIATRIC: Normal mood and affect. Normal behavior. Normal judgment and thought content. CARDIOVASCULAR: Normal heart rate noted RESPIRATORY: Effort and breath sounds normal, no problems with respiration noted ABDOMEN: No masses noted. No other overt distention noted.   PELVIC: Deferred  Labs and Imaging Results for orders placed or performed in visit on 06/23/19 (from the past 336 hour(s))  C. trachomatis/N. gonorrhoeae RNA   Collection Time: 06/23/19  7:51 AM   Specimen: Blood  Result Value Ref Range   C. trachomatis RNA, TMA NOT DETECTED NOT DETECT   N. gonorrhoeae RNA, TMA NOT DETECTED NOT DETECT  WET PREP BY MOLECULAR PROBE   Collection Time: 06/23/19  7:51 AM   Specimen: Blood  Result Value Ref Range   MICRO NUMBER: [id number]    SPECIMEN QUALITY: Adequate    SOURCE: VAGINA    STATUS: FINAL    Trichomonas vaginosis Not Detected    Gardnerella vaginalis (A)     Detected.  Increased levels of G. vaginalis may not be significant in the absence of signs and symptoms of bacterial vaginosis.   Candida species Detected (A)   HSV 1/2 Ab (IgM), IFA w/rflx Titer   Collection Time: 06/23/19  7:51 AM  Result Value Ref Range   HSV 1 IgM Screen Negative Negative   HSV 2 IgM Screen Negative Negative  HSV(herpes simplex vrs) 1+2 ab-IgG   Collection Time: 06/23/19  7:51 AM  Result Value Ref Range   HAV 1 IGG,TYPE SPECIFIC AB 5.10 (H) index   HSV 2 IGG,TYPE SPECIFIC AB >23.00 (H) index  HIV Antibody (routine  testing w rflx)   Collection Time: 06/23/19  7:51 AM  Result Value Ref Range   HIV 1&2 Ab, 4th Generation NON-REACTIVE NON-REACTI  Hepatitis C antibody   Collection Time: 06/23/19  7:51 AM  Result Value Ref Range   Hepatitis C Ab NON-REACTIVE NON-REACTI   SIGNAL TO CUT-OFF 0.01 <1.00  RPR   Collection Time: 06/23/19  7:51 AM  Result Value Ref Range   RPR Ser Ql NON-REACTIVE NON-REACTI       Assessment and Plan:      1. BV (bacterial vaginosis) She did not get Rx for this, this was prescribed. - metroNIDAZOLE (FLAGYL) 500 MG tablet; Take 1 tablet (500 mg total) by mouth 2 (two) times daily.  Dispense: 14 tablet; Refill: 7  2. Recurrent candidiasis of vagina Diflucan prescribed, she is on long term Doxycycline.   - fluconazole (DIFLUCAN) 150 MG tablet; Take 1 tablet (150 mg total) by mouth every 3 (three) days. For three doses  Dispense: 3 tablet; Refill: 7  3. 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 Never had genital or oral lesions. Had a lengthy discussion about this results, it just means she was exposed to this in the past like most of the population. No further intervention or follow up needed unless she has prodromal symptoms/outbreaks. Addressed the stigma surrounding genital HSV or HSV infection in general, and reassured the patient. All questions answered.   4. Lower abdominal pain Urine culture done.  If symptoms continue, may need imaging (still has her right ovary and she is milldy concerned about this).  - Urine Culture  Routine preventative health maintenance measures emphasized. Please refer to After Visit Summary for other counseling recommendations.   Return for any gynecologic concerns.    Total face-to-face time with patient: 20 minutes.  Over 50% of encounter was spent on counseling and coordination of care.   Verita Schneiders, MD, [place] for [employer], [place]

## 2019-07-02 LAB — URINE CULTURE: Organism ID, Bacteria: NO GROWTH

## 2019-07-05 ENCOUNTER — Other Ambulatory Visit: Payer: Self-pay | Admitting: Otolaryngology

## 2019-07-05 DIAGNOSIS — H9311 Tinnitus, right ear: Secondary | ICD-10-CM

## 2019-07-15 ENCOUNTER — Other Ambulatory Visit: Payer: Self-pay

## 2019-07-15 ENCOUNTER — Ambulatory Visit
Admission: RE | Admit: 2019-07-15 | Discharge: 2019-07-15 | Disposition: A | Discharge status: Home / Self Care | Payer: 59 | Source: Ambulatory Visit | Attending: Otolaryngology | Admitting: Otolaryngology

## 2019-07-15 DIAGNOSIS — H9311 Tinnitus, right ear: Secondary | ICD-10-CM | POA: Insufficient documentation

## 2019-07-15 MED ORDER — GADOBUTROL 1 MMOL/ML IV SOLN
7.0000 mL | Freq: Once | INTRAVENOUS | Status: AC | PRN
  Administered 2019-07-15: 7 mL via INTRAVENOUS

## 2019-07-27 ENCOUNTER — Ambulatory Visit (INDEPENDENT_AMBULATORY_CARE_PROVIDER_SITE_OTHER): Payer: 59 | Admitting: *Deleted

## 2019-07-27 ENCOUNTER — Other Ambulatory Visit: Payer: Self-pay

## 2019-07-27 DIAGNOSIS — R300 Dysuria: Secondary | ICD-10-CM | POA: Diagnosis not present

## 2019-07-27 LAB — POCT URINALYSIS DIPSTICK

## 2019-07-27 MED ORDER — PHENAZOPYRIDINE HCL 200 MG PO TABS: 200.0000 mg | ORAL_TABLET | Freq: Three times a day (TID) | ORAL | 1 refills | Status: DC | PRN

## 2019-07-27 MED ORDER — NITROFURANTOIN MONOHYD MACRO 100 MG PO CAPS: 100.0000 mg | ORAL_CAPSULE | Freq: Two times a day (BID) | ORAL | 0 refills | Status: DC

## 2019-07-27 NOTE — Progress Notes (Signed)
Patient seen and assessed by nursing staff.  Agree with documentation and plan.  

## 2019-07-27 NOTE — Progress Notes (Signed)
SUBJECTIVE: Ashley [name] is a 54 y.o. female who complains of urinary frequency, urgency and dysuria x 4  days, without flank pain, fever, chills, or abnormal vaginal discharge or bleeding.   OBJECTIVE: Appears well, in no apparent distress.  Vital signs are normal. Urine dipstick shows positive for leukocytes.    ASSESSMENT: Dysuria  PLAN: Treatment per orders.  Call or return to clinic prn if these symptoms worsen or fail to improve as anticipated.

## 2019-07-29 LAB — URINE CULTURE

## 2019-08-02 ENCOUNTER — Ambulatory Visit: Payer: 59 | Admitting: Family Medicine

## 2019-08-10 ENCOUNTER — Ambulatory Visit: Payer: 59

## 2019-08-10 ENCOUNTER — Ambulatory Visit (INDEPENDENT_AMBULATORY_CARE_PROVIDER_SITE_OTHER): Payer: 59 | Admitting: Obstetrics & Gynecology

## 2019-08-10 ENCOUNTER — Other Ambulatory Visit: Payer: Self-pay

## 2019-08-10 ENCOUNTER — Encounter: Payer: Self-pay | Admitting: Obstetrics & Gynecology

## 2019-08-10 VITALS — BP 154/84 | HR 109 | Temp 98.9°F | Wt 167.4 lb

## 2019-08-10 DIAGNOSIS — B9620 Unspecified Escherichia coli [E. coli] as the cause of diseases classified elsewhere: Secondary | ICD-10-CM

## 2019-08-10 DIAGNOSIS — N12 Tubulo-interstitial nephritis, not specified as acute or chronic: Secondary | ICD-10-CM | POA: Diagnosis not present

## 2019-08-10 DIAGNOSIS — M545 Low back pain, unspecified: Secondary | ICD-10-CM

## 2019-08-10 DIAGNOSIS — R1030 Lower abdominal pain, unspecified: Secondary | ICD-10-CM

## 2019-08-10 LAB — COMPREHENSIVE METABOLIC PANEL
ALT: 15 IU/L (ref 0–32)
AST: 25 IU/L (ref 0–40)
Albumin/Globulin Ratio: 1.8 (ref 1.2–2.2)
Albumin: 4.6 g/dL (ref 3.8–4.9)
Alkaline Phosphatase: 96 IU/L (ref 48–121)
BUN/Creatinine Ratio: 17 (ref 9–23)
BUN: 14 mg/dL (ref 6–24)
Bilirubin Total: 0.2 mg/dL (ref 0.0–1.2)
CO2: 22 mmol/L (ref 20–29)
Calcium: 10.6 mg/dL — ABNORMAL HIGH (ref 8.7–10.2)
Chloride: 103 mmol/L (ref 96–106)
Creatinine, Ser: 0.83 mg/dL (ref 0.57–1.00)
GFR calc Af Amer: 92 mL/min/{1.73_m2} (ref >59)
GFR calc non Af Amer: 80 mL/min/{1.73_m2} (ref >59)
Globulin, Total: 2.5 g/dL (ref 1.5–4.5)
Glucose: 85 mg/dL (ref 65–99)
Potassium: 5.2 mmol/L (ref 3.5–5.2)
Sodium: 140 mmol/L (ref 134–144)
Total Protein: 7.1 g/dL (ref 6.0–8.5)

## 2019-08-10 LAB — CBC WITH DIFFERENTIAL/PLATELET
Basophils Absolute: 0 10*3/uL (ref 0.0–0.2)
Basos: 1 %
EOS (ABSOLUTE): 0 10*3/uL (ref 0.0–0.4)
Eos: 0 %
Hematocrit: 43 % (ref 34.0–46.6)
Hemoglobin: 13.8 g/dL (ref 11.1–15.9)
Immature Grans (Abs): 0 10*3/uL (ref 0.0–0.1)
Immature Granulocytes: 0 %
Lymphocytes Absolute: 1.2 10*3/uL (ref 0.7–3.1)
Lymphs: 30 %
MCH: 27.9 pg (ref 26.6–33.0)
MCHC: 32.1 g/dL (ref 31.5–35.7)
MCV: 87 fL (ref 79–97)
Monocytes Absolute: 0.4 10*3/uL (ref 0.1–0.9)
Monocytes: 9 %
Neutrophils Absolute: 2.4 10*3/uL (ref 1.4–7.0)
Neutrophils: 60 %
Platelets: 306 10*3/uL (ref 150–450)
RBC: 4.95 x10E6/uL (ref 3.77–5.28)
RDW: 14.8 % (ref 11.7–15.4)
WBC: 4 10*3/uL (ref 3.4–10.8)

## 2019-08-10 LAB — POCT URINALYSIS DIPSTICK
Bilirubin, UA: NEGATIVE
Blood, UA: NEGATIVE
Glucose, UA: NEGATIVE
Ketones, UA: NEGATIVE
Leukocytes, UA: NEGATIVE
Nitrite, UA: NEGATIVE
Protein, UA: NEGATIVE
Spec Grav, UA: 1.015 (ref 1.010–1.025)
Urobilinogen, UA: 0.2 E.U./dL
pH, UA: 7 (ref 5.0–8.0)

## 2019-08-10 MED ORDER — PHENAZOPYRIDINE HCL 200 MG PO TABS: 200.0000 mg | ORAL_TABLET | Freq: Three times a day (TID) | ORAL | 1 refills | Status: DC | PRN

## 2019-08-10 MED ORDER — LEVOFLOXACIN 750 MG PO TABS: 750.0000 mg | ORAL_TABLET | Freq: Every day | ORAL | 1 refills | Status: DC

## 2019-08-10 NOTE — Progress Notes (Signed)
GYNECOLOGY OFFICE VISIT NOTE  History:   Ashley [name] is a 54 y.o. [phone] here today for worsening bilateral low back pain and R upper abdominal and flank pain.  Was treated recently for E.coli UTI with Macrobid.  Still has same symptoms, except worse.  No fevers, N/V/D.  Just low back pain R>>L and bladder pain.  History of recurrent UTIs . She denies any abnormal vaginal discharge, bleeding or other concerns.    Past Medical History:  Diagnosis Date  . Anemia   . Bronchitis    2 years ago   . [place] arthritis 2019   base of left thumb   . GERD (gastroesophageal reflux disease)    diet controlled , no meds  . Hydrosalpinx    bilateral   . Hyperlipidemia    recent dx - currently diet control - no meds  . Hypothyroid 2003   radio active iodine - no meds  . Left-sided weakness 02/12/2019  . SVD (spontaneous vaginal delivery)    x 1  . Urinary frequency 11/30/2018  . Vitamin B 12 deficiency     Past Surgical History:  Procedure Laterality Date  . CESAREAN SECTION     x 2  . CHOLECYSTECTOMY  2008  . DILATION AND CURETTAGE OF UTERUS     X 2  . LAPAROSCOPIC BILATERAL SALPINGECTOMY Bilateral 07/29/2017   Procedure: LAPAROSCOPIC LEFT SALPINGOOPHRECTOMY: Partial right Salpingectomy;  Surgeon: Lavonia Drafts, MD;  Location: [facility];  Service: Gynecology;  Laterality: Bilateral;  . SHOULDER SURGERY  2016   arthroscopic  . TOTAL ABDOMINAL HYSTERECTOMY  2006   Fibroids, benign pathology; partial hysterectomy per patient   . TUBAL LIGATION    . WISDOM TOOTH EXTRACTION     at age 57 yr  . WRIST ARTHROSCOPY Right 2007    The following portions of the patient's history were reviewed and updated as appropriate: allergies, current medications, past family history, past medical history, past social history, past surgical history and problem list.    Review of Systems:  Pertinent items noted in HPI and remainder of comprehensive ROS otherwise  negative.  Physical Exam:  BP (!) 154/84   Pulse (!) 109   Temp 98.9 F (37.2 C)   Wt 167 lb 6.4 oz (75.9 kg)   BMI 27.02 kg/m  CONSTITUTIONAL: Well-developed, well-nourished female in no acute distress.  HEENT:  Normocephalic, atraumatic. External right and left ear normal. No scleral icterus.  NECK: Normal range of motion, supple, no masses noted on observation SKIN: No rash noted. Not diaphoretic. No erythema. No pallor. MUSCULOSKELETAL: Normal range of motion. No edema noted. NEUROLOGIC: Alert and oriented to person, place, and time. Normal muscle tone coordination. No cranial nerve deficit noted. PSYCHIATRIC: Normal mood and affect. Normal behavior. Normal judgment and thought content. CARDIOVASCULAR: Normal heart rate noted RESPIRATORY: Effort and breath sounds normal, no problems with respiration noted BACK: Bilateral moderate CVAT, R>L ABDOMEN: No masses noted. No other overt distention noted.  +Suprapubic pain on palpation. PELVIC: Deferred  Labs and Imaging Results for orders placed or performed in visit on 08/10/19 (from the past 168 hour(s))  POCT Urinalysis Dipstick   Collection Time: 08/10/19 11:53 AM  Result Value Ref Range   Color, UA yellow    Clarity, UA clear    Glucose, UA Negative Negative   Bilirubin, UA neg    Ketones, UA neg    Spec Grav, UA 1.015 1.010 - 1.025   Blood, UA neg  pH, UA 7.0 5.0 - 8.0   Protein, UA Negative Negative   Urobilinogen, UA 0.2 0.2 or 1.0 E.U./dL   Nitrite, UA neg    Leukocytes, UA Negative Negative   Appearance     Odor     Recent Results (from the past 720 hour(s))  Urine Culture     Status: Abnormal   Collection Time: 07/27/19 10:03 AM   Specimen: Urine   UR  Result Value Ref Range Status   Urine Culture, Routine Final report (A)  Final   Organism ID, Bacteria Escherichia coli (A)  Final    Comment: Greater than 100,000 colony forming units per mL Cefazolin with an MIC <=16 predicts susceptibility to the oral  agents cefaclor, cefdinir, cefpodoxime, cefprozil, cefuroxime, cephalexin, and loracarbef when used for therapy of uncomplicated urinary tract infections due to E. coli, Klebsiella pneumoniae, and Proteus mirabilis.    Antimicrobial Susceptibility Comment  Final    Comment:       ** S = Susceptible; I = Intermediate; R = Resistant **                    P = Positive; N = Negative             MICS are expressed in micrograms per mL    Antibiotic                 RSLT#1    RSLT#2    RSLT#3    RSLT#4 Amoxicillin/Clavulanic Acid    I Ampicillin                     R Cefazolin                      S Cefepime                       S Ceftriaxone                    S Cefuroxime                     S Ciprofloxacin                  S Ertapenem                      S Gentamicin                     S Imipenem                       S Levofloxacin                   S Meropenem                      S Nitrofurantoin                 S Piperacillin/Tazobactam        S Tetracycline                   R Tobramycin                     S Trimethoprim/Sulfa             S         Assessment and Plan:  1. Lower abdominal pain 2. Pyelonephritis due to Escherichia coli 3. Acute bilateral low back pain Concerned about worsening UTI/pyelonephritis.  Prescribed Levofloxacin, she will also refill her Pyridium. Will get renal ultrasound to evaluate for possibility of stones or other anomalies (Udip is negative today).  Labs checked also, will follow up results and manage accordingly.  Given recurrent urinary symptoms, will also refer to Urology. - levofloxacin (LEVAQUIN) 750 MG tablet; Take 1 tablet (750 mg total) by mouth daily for 7 days.  Dispense: 7 tablet; Refill: 1 - US RENAL; Future - Ambulatory referral to Urology - Comprehensive metabolic panel - POCT Urinalysis Dipstick - CBC w/Diff Routine preventative health maintenance measures emphasized. Please refer to After Visit Summary for other  counseling recommendations.   No follow-ups on file.    Total face-to-face time with patient: 15 minutes.  Over 50% of encounter was spent on counseling and coordination of care.   Verita Schneiders, MD, [place] for [employer] [facility], [place]

## 2019-08-12 ENCOUNTER — Encounter: Payer: Self-pay | Admitting: Family Medicine

## 2019-08-12 ENCOUNTER — Ambulatory Visit: Payer: 59 | Admitting: Family Medicine

## 2019-08-12 ENCOUNTER — Other Ambulatory Visit: Payer: Self-pay

## 2019-08-12 DIAGNOSIS — T50905A Adverse effect of unspecified drugs, medicaments and biological substances, initial encounter: Secondary | ICD-10-CM | POA: Diagnosis not present

## 2019-08-12 NOTE — Progress Notes (Signed)
This visit occurred during the SARS-CoV-2 public health emergency.  Safety protocols were in place, including screening questions prior to the visit, additional usage of staff PPE, and extensive cleaning of exam room while observing appropriate contact time as indicated for disinfecting solutions.  She was on doxy at baseline per dermatology. Recently started on levaquin.  Discussed recent history.  She had UTI on 07/27/19.  She was treated with macrobid and she had improved, had less urinary sx.  Then urinary sx returned with some R sided abd pain.    Started on levaquin in the meantime with u/s pending.  She has urology f/u pending.   She had mildly elevated calcium level, d/w pt.  No h/o renal stones.    She has new symptoms starting levaquin.  She has worsening tinnitus in B ears.  Worse abd wall pain, and it moves across the abd wall, usually on the R side.  No fevers.  No vomiting.  Some nausea.  She is a little lightheaded but not now.  No syncope.    No other h/o levaquin use.  Pyridium helped with lower abd discomfort.  R sided abd wall pain persists.    She has new ankle knee and arm pain since starting levaquin.  She has had 3 doses of levaquin in the meantime.    No wheeze, no lip swelling  Meds, vitals, and allergies reviewed.   ROS: Per HPI unless specifically indicated in ROS section   nad Ncat, TM wnl B Neck supple no LA rrr ctab Abd soft, normal BS.  R side abd wall ttp w/o rebound.   R CVA pain improved today per patient report-she does not have CVA tenderness on exam Normal R knee ROM.   Able to bear weight No stridor. No edema.  No wheeze.

## 2019-08-12 NOTE — Patient Instructions (Addendum)
It is reasonable to recheck your calcium when you are feeling better.  Call for a lab appointment next week or the week thereafter.    Stop levaquin for now.  Drink plenty of water, to keep your urine clear or light colored.    Okay to use a stool softener as needed or take miralax.    Update [place] as needed.  Take care.  Glad to see you.

## 2019-08-13 ENCOUNTER — Ambulatory Visit
Admission: RE | Admit: 2019-08-13 | Discharge: 2019-08-13 | Disposition: A | Discharge status: Home / Self Care | Payer: 59 | Source: Ambulatory Visit | Attending: Primary Care | Admitting: Primary Care

## 2019-08-13 DIAGNOSIS — Z1231 Encounter for screening mammogram for malignant neoplasm of breast: Secondary | ICD-10-CM

## 2019-08-15 DIAGNOSIS — T50905A Adverse effect of unspecified drugs, medicaments and biological substances, initial encounter: Secondary | ICD-10-CM | POA: Insufficient documentation

## 2019-08-15 HISTORY — DX: Adverse effect of unspecified drugs, medicaments and biological substances, initial encounter: T50.905A

## 2019-08-15 NOTE — Assessment & Plan Note (Signed)
Discussed options.  Nontoxic.  Still okay for outpatient follow-up.  Likely with symptoms related to Levaquin use.  Likely with incidental mild hypercalcemia.  It is reasonable to recheck her calcium when she is feeling better.  She can call for a lab appointment next week or the week thereafter.    Stop levaquin for now.  Drink plenty of water, to keep urine clear or light colored.  Likely previous UTI has been adequately treated.  Okay to use a stool softener as needed or take miralax.    Update [place] as needed.  She agrees with plan.

## 2019-08-16 ENCOUNTER — Ambulatory Visit
Admission: EM | Admit: 2019-08-16 | Discharge: 2019-08-16 | Disposition: A | Discharge status: Other Acute Inpatient Hospital | Payer: 59 | Source: Home / Self Care | Attending: Emergency Medicine | Admitting: Emergency Medicine

## 2019-08-16 ENCOUNTER — Other Ambulatory Visit: Payer: Self-pay

## 2019-08-16 ENCOUNTER — Emergency Department: Payer: 59

## 2019-08-16 ENCOUNTER — Encounter: Payer: Self-pay | Admitting: Emergency Medicine

## 2019-08-16 ENCOUNTER — Emergency Department
Admission: EM | Admit: 2019-08-16 | Discharge: 2019-08-16 | Disposition: A | Discharge status: Home / Self Care | Payer: 59 | Attending: Emergency Medicine | Admitting: Emergency Medicine

## 2019-08-16 DIAGNOSIS — R110 Nausea: Secondary | ICD-10-CM | POA: Diagnosis not present

## 2019-08-16 DIAGNOSIS — E039 Hypothyroidism, unspecified: Secondary | ICD-10-CM | POA: Insufficient documentation

## 2019-08-16 DIAGNOSIS — R1031 Right lower quadrant pain: Secondary | ICD-10-CM | POA: Insufficient documentation

## 2019-08-16 DIAGNOSIS — R109 Unspecified abdominal pain: Secondary | ICD-10-CM

## 2019-08-16 LAB — BASIC METABOLIC PANEL
Anion gap: 7 (ref 5–15)
BUN: 13 mg/dL (ref 6–20)
CO2: 27 mmol/L (ref 22–32)
Calcium: 10 mg/dL (ref 8.9–10.3)
Chloride: 105 mmol/L (ref 98–111)
Creatinine, Ser: 0.74 mg/dL (ref 0.44–1.00)
GFR calc Af Amer: >60 mL/min (ref >60)
GFR calc non Af Amer: >60 mL/min (ref >60)
Glucose, Bld: 108 mg/dL — ABNORMAL HIGH (ref 70–99)
Potassium: 4.8 mmol/L (ref 3.5–5.1)
Sodium: 139 mmol/L (ref 135–145)

## 2019-08-16 LAB — CBC WITH DIFFERENTIAL/PLATELET
Abs Immature Granulocytes: 0.01 10*3/uL (ref 0.00–0.07)
Basophils Absolute: 0 10*3/uL (ref 0.0–0.1)
Basophils Relative: 1 %
Eosinophils Absolute: 0 10*3/uL (ref 0.0–0.5)
Eosinophils Relative: 1 %
HCT: 40.2 % (ref 36.0–46.0)
Hemoglobin: 13 g/dL (ref 12.0–15.0)
Immature Granulocytes: 0 %
Lymphocytes Relative: 47 %
Lymphs Abs: 1.5 10*3/uL (ref 0.7–4.0)
MCH: 27.2 pg (ref 26.0–34.0)
MCHC: 32.3 g/dL (ref 30.0–36.0)
MCV: 84.1 fL (ref 80.0–100.0)
Monocytes Absolute: 0.3 10*3/uL (ref 0.1–1.0)
Monocytes Relative: 9 %
Neutro Abs: 1.4 10*3/uL — ABNORMAL LOW (ref 1.7–7.7)
Neutrophils Relative %: 42 %
Platelets: 297 10*3/uL (ref 150–400)
RBC: 4.78 MIL/uL (ref 3.87–5.11)
RDW: 15.2 % (ref 11.5–15.5)
WBC: 3.3 10*3/uL — ABNORMAL LOW (ref 4.0–10.5)
nRBC: 0 % (ref 0.0–0.2)

## 2019-08-16 LAB — URINALYSIS, COMPLETE (UACMP) WITH MICROSCOPIC
Bacteria, UA: NONE SEEN
Bilirubin Urine: NEGATIVE
Glucose, UA: NEGATIVE mg/dL
Hgb urine dipstick: NEGATIVE
Ketones, ur: NEGATIVE mg/dL
Leukocytes,Ua: NEGATIVE
Nitrite: NEGATIVE
Protein, ur: NEGATIVE mg/dL
RBC / HPF: NONE SEEN RBC/hpf (ref 0–5)
Specific Gravity, Urine: >1.03 — ABNORMAL HIGH (ref 1.005–1.030)
pH: 5.5 (ref 5.0–8.0)

## 2019-08-16 LAB — POCT PREGNANCY, URINE: Preg Test, Ur: NEGATIVE

## 2019-08-16 MED ORDER — POLYETHYLENE GLYCOL 3350 17 GM/SCOOP PO POWD: 1.0000 | Freq: Once | ORAL | 0 refills | Status: AC

## 2019-08-16 MED ORDER — POLYETHYLENE GLYCOL 3350 17 GM/SCOOP PO POWD: 1.0000 | Freq: Once | ORAL | 0 refills | Status: DC

## 2019-08-16 MED ORDER — KETOROLAC TROMETHAMINE 60 MG/2ML IM SOLN
30.0000 mg | Freq: Once | INTRAMUSCULAR | Status: AC
  Administered 2019-08-16: 30 mg via INTRAMUSCULAR

## 2019-08-16 MED ORDER — OXYCODONE-ACETAMINOPHEN 5-325 MG PO TABS
2.0000 | ORAL_TABLET | Freq: Once | ORAL | Status: AC
  Administered 2019-08-16: 2 via ORAL
  Filled 2019-08-16: qty 2

## 2019-08-16 NOTE — ED Provider Notes (Addendum)
[facility]    CSN: [id number] Arrival date & time: 08/16/19  2836      History   Chief Complaint Chief Complaint  Patient presents with  . Abdominal Pain  . Flank Pain    HPI Ashley [name] is a 54 y.o. female.   Patient is a 54 year old female who presents with chief complaint of right lower quadrant pain.  Patient initially seen on June 1 and diagnosed with UTI.  She was prescribed Macrobid which culture identified as E. coli.  Patient symptoms improve but then worsened and she was seen June 15.  She was started on Levaquin with an outpatient ultrasound scheduled for tomorrow.  She was seen again on the 17th for similar symptoms.  Levaquin was stopped at that time due to patient report of muscle, joint, and nerve pain.  Patient states the pain has resolved with stopping the Levaquin. She was advised to increase her fluids and to keep her ultrasound appointment.  Patient has had a hysterectomy but still retains her right ovary.  Patient reports that the pain is to her lower back, right side but tends to move around the right lower quadrant.  Pt reports that site is tender to palpation and is unable to lay on that side and has increased pain with movement.  Patient states the pain was worse last night but fell asleep after taking Advil PM.  Sure the pain was 10+ last night.  Patient reports that her UTI symptoms have basically resolved.  She denies any vaginal irritation or discharge.  Patient denies any nausea, vomiting, diarrhea or constipation.  Patient reports a normal BM yesterday.  Patient also reports she is status post cholecystectomy and states that the pain is a little similar to what she was having at that point when she had that gallbladder removed.      Past Medical History:  Diagnosis Date  . Anemia   . Bronchitis    2 years ago   . [place] arthritis 2019   base of left thumb   . GERD (gastroesophageal reflux disease)    diet controlled , no meds  .  Hydrosalpinx    bilateral   . Hyperlipidemia    recent dx - currently diet control - no meds  . Hypothyroid 2003   radio active iodine - no meds  . Left-sided weakness 02/12/2019  . SVD (spontaneous vaginal delivery)    x 1  . Urinary frequency 11/30/2018  . Vitamin B 12 deficiency     Patient Active Problem List   Diagnosis Date Noted  . Adverse effect of drug 08/15/2019  . Preventative health care 06/23/2019  . Constipation 04/07/2019  . Neck pain 02/17/2019  . HTN (hypertension), malignant   . TIA (transient ischemic attack)   . 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 Numbness and tingling 02/01/2019  . Osteoarthritis 06/30/2018  . Vitamin D deficiency 06/23/2018  . Vitamin B 12 deficiency 06/23/2018  . Abdominal pain 06/23/2018  . GERD (gastroesophageal reflux disease) 01/30/2018  . Dyslipidemia 03/19/2017  . Allergic rhinitis 03/19/2017  . Ovarian cyst, left 02/23/2016    Past Surgical History:  Procedure Laterality Date  . CESAREAN SECTION     x 2  . CHOLECYSTECTOMY  2008  . DILATION AND CURETTAGE OF UTERUS     X 2  . LAPAROSCOPIC BILATERAL SALPINGECTOMY Bilateral 07/29/2017   Procedure: LAPAROSCOPIC LEFT SALPINGOOPHRECTOMY: Partial right Salpingectomy;  Surgeon: Lavonia Drafts, MD;  Location: [place];  Service: Gynecology;  Laterality: Bilateral;  . SHOULDER SURGERY  2016   arthroscopic  . TOTAL ABDOMINAL HYSTERECTOMY  2006   Fibroids, benign pathology; partial hysterectomy per patient   . TUBAL LIGATION    . WISDOM TOOTH EXTRACTION     at age 36 yr  . WRIST ARTHROSCOPY Right 2007    OB History    Gravida  5   Para  3   Term  3   Preterm  0   AB  2   Living  3     SAB  2   TAB  0   Ectopic  0   Multiple  0   Live Births  3            Home Medications    Prior to Admission medications   Medication Sig Start Date End Date Taking? Authorizing Provider  amLODipine (NORVASC) 5 MG tablet Take 1 tablet (5 mg  total) by mouth daily. For blood pressure. 04/27/19  Yes Pleas Koch, NP  phenazopyridine (PYRIDIUM) 200 MG tablet Take 1 tablet (200 mg total) by mouth 3 (three) times daily as needed for pain (urethral spasm). 08/10/19  Yes Anyanwu, Sallyanne Havers, MD  rosuvastatin (CRESTOR) 5 MG tablet Take 1 tablet (5 mg total) by mouth every evening. For cholesterol. 04/09/19  Yes Pleas Koch, NP  doxycycline (VIBRAMYCIN) 50 MG capsule Take 50 mg by mouth daily. 07/03/19   [provider]    Family History Family History  Problem Relation Age of Onset  . Hypertension Mother   . Breast cancer Maternal Aunt   . Hypertension Maternal Grandmother   . Heart failure Maternal Grandmother   . Lung cancer Maternal Grandmother   . Other Father        unknown medical history  . Colon cancer Neg Hx   . Colon polyps Neg Hx   . Rectal cancer Neg Hx   . Stomach cancer Neg Hx     Social History Social History   Tobacco Use  . Smoking status: Never Smoker  . Smokeless tobacco: Never Used  Vaping Use  . Vaping Use: Never used  Substance Use Topics  . Alcohol use: No  . Drug use: No     Allergies   Levaquin [levofloxacin]   Review of Systems Review of Systems as noted above in HPI.  Other systems reviewed and found to be negative   Physical Exam Triage Vital Signs ED Triage Vitals  Enc Vitals Group     BP 08/16/19 0826 (!) 143/88     Pulse Rate 08/16/19 0826 90     Resp 08/16/19 0826 15     Temp 08/16/19 0826 98.7 F (37.1 C)     Temp Source 08/16/19 0826 Oral     SpO2 08/16/19 0826 100 %     Weight 08/16/19 0827 165 lb (74.8 kg)     Height 08/16/19 0827 5\' 6"  (1.676 m)     Head Circumference --      Peak Flow --      Pain Score 08/16/19 0827 7     Pain Loc --      Pain Edu? --      Excl. in [place]? --    No data found.  Updated Vital Signs BP (!) 143/88 (BP Location: Left Arm)   Pulse 90   Temp 98.7 F (37.1 C) (Oral)   Resp 15   Ht 5\' 6"  (1.676 m)   Wt 165 lb (  74.8  kg)   SpO2 100%   BMI 26.63 kg/m    Physical Exam Constitutional:      General: She is in acute distress (moderate distress with R sided abdominal pain. ).     Appearance: She is well-developed.     Comments: Tearing in eyes from pain related to exam.  Patient bent over at the waist holding her side while getting up from the chair up to the exam table.  Cardiovascular:     Rate and Rhythm: Normal rate and regular rhythm.  Pulmonary:     Effort: Pulmonary effort is normal. No respiratory distress.     Breath sounds: Normal breath sounds.  Abdominal:     General: Bowel sounds are normal. There is no distension.     Palpations: Abdomen is soft. There is no mass.     Tenderness: There is abdominal tenderness in the right lower quadrant. There is right CVA tenderness and guarding.     Hernia: No hernia is present.  Skin:    General: Skin is warm.     Capillary Refill: Capillary refill takes less than 2 seconds.  Neurological:     General: No focal deficit present.     Mental Status: She is alert and oriented to person, place, and time.      UC Treatments / Results  Labs (all labs ordered are listed, but only abnormal results are displayed) Labs Reviewed  URINALYSIS, COMPLETE (UACMP) WITH MICROSCOPIC - Abnormal; Notable for the following components:      Result Value   Specific Gravity, Urine >1.030 (*)    All other components within normal limits  CBC WITH DIFFERENTIAL/PLATELET - Abnormal; Notable for the following components:   WBC 3.3 (*)    Neutro Abs 1.4 (*)    All other components within normal limits    EKG   Radiology No results found.  Procedures Procedures (including critical care time)  Medications Ordered in UC Medications  ketorolac (TORADOL) injection 30 mg (30 mg Intramuscular Given 08/16/19 0856)    Initial Impression / Assessment and Plan / UC Course  I have reviewed the triage vital signs and the nursing notes.  Pertinent labs & imaging results  that were available during my care of the patient were reviewed by me and considered in my medical decision making (see chart for details).    Per interview and urinalysis, appears her UTI and its associate symptoms has resolved.  Exam and patient guarded movement from chair to exam table and reported 10+ pain is concerning.  CBC without leukocytosis.  Pain to minimal palpation.  She is status post cholecystectomy and hysterectomy does retain her right ovary..  Will order a CT scan to evaluate for stone, sequela from her retained ovary, or other possible cause for her pain.  Toradol given for pain in the clinic.  Patient's insurance contacted and they will not authorize a outpatient CT, even emergent, until they receive paperwork that process will take about 3 hours.  They will not offer peer-to-peer discussion either.  With that, will call and wants to take patient to the emergency room for further work-up.  Camera operator at [facility] ER notified of transfer.  Final Clinical Impressions(s) / UC Diagnoses   Final diagnoses:  RLQ abdominal pain     Discharge Instructions     -With no urinary symptoms and clean UA, believe your UTI has resolved. -Given your severe pain and possible diagnosis involving your retained ovary, possible renal stone, abscess,  or appendix, fill it is important to be seen in the ER for further work-up.  Would recommend against going by private car or later.    ED Prescriptions    None     PDMP not reviewed this encounter.   Luvenia Redden, PA-C 08/16/19 0931    Luvenia Redden, PA-C 08/16/19 [phone]

## 2019-08-16 NOTE — ED Notes (Signed)
MD at bedside. 

## 2019-08-16 NOTE — ED Notes (Signed)
Pt transported to US

## 2019-08-16 NOTE — ED Triage Notes (Addendum)
Pt comes into the ED via EMS from urgent care in [place] with c/o RLQ pain since 6/1, states she was seen and treated for UTI and that resolved but is continuing to have RLQ WITH NAUSEA, CBC and UA were performed at the urgent care and are resulted.

## 2019-08-16 NOTE — Discharge Instructions (Addendum)
-  With no urinary symptoms and clean UA, believe your UTI has resolved. -Given your severe pain and possible diagnosis involving your retained ovary, possible renal stone, abscess, or appendix, fill it is important to be seen in the ER for further work-up.  Would recommend against going by private car or later.

## 2019-08-16 NOTE — ED Provider Notes (Signed)
ER Provider Note       Time seen: 12:13 PM    I have reviewed the vital signs and the nursing notes.  HISTORY   Chief Complaint Abdominal Pain    HPI Ashley [name] is a 54 y.o. female with a history of anemia, bronchitis, GERD, hyperlipidemia, hypothyroidism, hysterectomy who presents today for right lower quadrant pain since January 1.  Patient states she was seen and treated for UTI and that resolved but she has continued to have right lower quadrant pain with nausea.  Patient was seen in urgent care today and sent here for further evaluation.  Past Medical History:  Diagnosis Date  . Anemia   . Bronchitis    2 years ago   . [place] arthritis 2019   base of left thumb   . GERD (gastroesophageal reflux disease)    diet controlled , no meds  . Hydrosalpinx    bilateral   . Hyperlipidemia    recent dx - currently diet control - no meds  . Hypothyroid 2003   radio active iodine - no meds  . Left-sided weakness 02/12/2019  . SVD (spontaneous vaginal delivery)    x 1  . Urinary frequency 11/30/2018  . Vitamin B 12 deficiency     Past Surgical History:  Procedure Laterality Date  . CESAREAN SECTION     x 2  . CHOLECYSTECTOMY  2008  . DILATION AND CURETTAGE OF UTERUS     X 2  . LAPAROSCOPIC BILATERAL SALPINGECTOMY Bilateral 07/29/2017   Procedure: LAPAROSCOPIC LEFT SALPINGOOPHRECTOMY: Partial right Salpingectomy;  Surgeon: Lavonia Drafts, MD;  Location: [place];  Service: Gynecology;  Laterality: Bilateral;  . SHOULDER SURGERY  2016   arthroscopic  . TOTAL ABDOMINAL HYSTERECTOMY  2006   Fibroids, benign pathology; partial hysterectomy per patient   . TUBAL LIGATION    . WISDOM TOOTH EXTRACTION     at age 65 yr  . WRIST ARTHROSCOPY Right 2007    Allergies Levaquin [levofloxacin]   Review of Systems Constitutional: Negative for fever. Cardiovascular: Negative for chest pain. Respiratory: Negative for shortness of  breath. Gastrointestinal: Positive for abdominal pain Musculoskeletal: Negative for back pain. Skin: Negative for rash. Neurological: Negative for headaches, focal weakness or numbness.  All systems negative/normal/unremarkable except as stated in the HPI  ____________________________________________   PHYSICAL EXAM:  VITAL SIGNS: Vitals:   08/16/19 1017  BP: (!) 168/90  Pulse: 78  Resp: 18  Temp: 98.6 F (37 C)  SpO2: 100%    Constitutional: Alert and oriented. Well appearing and in no distress. Eyes: Conjunctivae are normal. Normal extraocular movements. Cardiovascular: Normal rate, regular rhythm. No murmurs, rubs, or gallops. Respiratory: Normal respiratory effort without tachypnea nor retractions. Breath sounds are clear and equal bilaterally. No wheezes/rales/rhonchi. Gastrointestinal: Right flank tenderness, no rebound or guarding.  Normal bowel sounds. Musculoskeletal: Nontender with normal range of motion in extremities. No lower extremity tenderness nor edema. Neurologic:  Normal speech and language. No gross focal neurologic deficits are appreciated.  Skin:  Skin is warm, dry and intact. No rash noted. Psychiatric: Speech and behavior are normal.  ____________________________________________   LABS (pertinent positives/negatives)  Labs Reviewed  BASIC METABOLIC PANEL - Abnormal; Notable for the following components:      Result Value   Glucose, Bld 108 (*)    All other components within normal limits  POCT PREGNANCY, URINE    RADIOLOGY  Images were viewed by me Pelvic ultrasound/CT renal protocol Was unremarkable  DIFFERENTIAL  DIAGNOSIS  Renal colic, UTI, pyelonephritis, ovarian cyst, ovarian torsion, chronic pain, scar tissue  ASSESSMENT AND PLAN  Flank pain   Plan: The patient had presented for right lower quadrant pain. Patient's labs were unremarkable.  Pelvic ultrasound was unremarkable.  CT renal protocol did not reveal any acute process.   Symptoms likely related to gas or constipation.  Have advised MiraLAX but otherwise she is cleared for outpatient follow-up.  Lenise Arena MD    Note: This note was generated in part or whole with voice recognition software. Voice recognition is usually quite accurate but there are transcription errors that can and very often do occur. I apologize for any typographical errors that were not detected and corrected.     Earleen Newport, MD 08/16/19 [phone]

## 2019-08-16 NOTE — ED Triage Notes (Addendum)
Called patient's insurance co [facility]) to pre-cert CT Abd/Pelvis without contrast (renal stone study). Spoke with Mayer Camel, given case # [id number]. Ins co requesting medical records would not pre-certify STAT. Was advised that records would have to be faxed to [phone] and would receive an answer in 3 hours. Raiford Simmonds, PA-C notified and he advised to call EMS to transport to [facility].  Patient is being discharged from the Urgent Care and sent to the Emergency Department via EMS . Per Raiford Simmonds, PA-C, patient is in need of higher level of care due to severe abdominal and flank pain . Patient is aware and verbalizes understanding of plan of care.  Vitals:   08/16/19 0826  BP: (!) 143/88  Pulse: 90  Resp: 15  Temp: 98.7 F (37.1 C)  SpO2: 100%

## 2019-08-16 NOTE — ED Triage Notes (Signed)
Patient in today c/o RLQ abdominal pain x 2 weeks. Patient is being treated for a UTI and kidney infections. Patient is scheduled for renal ultrasound to evaluate for stones tomorrow at 1:30 at [facility] outpatient. Patient states the pain got severe last night.

## 2019-08-17 ENCOUNTER — Ambulatory Visit: Payer: 59

## 2019-08-20 ENCOUNTER — Ambulatory Visit: Payer: 59 | Admitting: Urology

## 2019-08-24 ENCOUNTER — Ambulatory Visit: Payer: 59 | Admitting: Primary Care

## 2019-08-25 ENCOUNTER — Ambulatory Visit: Payer: 59 | Admitting: Primary Care

## 2019-09-01 ENCOUNTER — Ambulatory Visit: Payer: 59 | Admitting: Primary Care

## 2019-09-01 ENCOUNTER — Other Ambulatory Visit: Payer: Self-pay

## 2019-09-01 ENCOUNTER — Encounter: Payer: Self-pay | Admitting: Primary Care

## 2019-09-01 VITALS — BP 124/84 | HR 86 | Temp 95.9°F | Ht 66.0 in | Wt 166.8 lb

## 2019-09-01 DIAGNOSIS — K5900 Constipation, unspecified: Secondary | ICD-10-CM | POA: Diagnosis not present

## 2019-09-01 DIAGNOSIS — J309 Allergic rhinitis, unspecified: Secondary | ICD-10-CM

## 2019-09-01 DIAGNOSIS — M2550 Pain in unspecified joint: Secondary | ICD-10-CM | POA: Insufficient documentation

## 2019-09-01 MED ORDER — FLUTICASONE PROPIONATE 50 MCG/ACT NA SUSP: 1.0000 | Freq: Two times a day (BID) | NASAL | 0 refills | Status: DC

## 2019-09-01 NOTE — Patient Instructions (Signed)
Start Colace once daily for constipation.  You may take Miralax daily for 3-4 days to see if this helps with movements, if needed.  Hold your rosuvastatin (Crestor) medication for two weeks for joint pain.  Use the nasal spray twice daily, one spray in each nostril for 1-2 weeks.  Please update me via My Chart as discussed.  It was a pleasure to see you today!

## 2019-09-01 NOTE — Assessment & Plan Note (Signed)
Refills provided for Flonase.

## 2019-09-01 NOTE — Assessment & Plan Note (Signed)
Continued, chronic, but seems to be her new norm. No alarm signs. CT scan and pelvic/transvaginal ultrasound reviewed and negative.  Discussed to start Colace daily, also use Miralax daily for 3-4 days if needed. Increase water/fiber/exercise.  She will update via My Chart.

## 2019-09-01 NOTE — Assessment & Plan Note (Signed)
Also with muscle spasms to lower extremities. Suspect, based off of HPI, that her statin is likely contributing.  Will start by holding Crestor x 2 weeks and have her update at that time. Consider changing to Pravastatin if needed. Await update. Consider rheumatology work up if needed.

## 2019-09-01 NOTE — Progress Notes (Signed)
Subjective:    Patient ID: Ashley [name], female    DOB: 05/02/1965, 54 y.o.   MRN: [id number]  HPI  This visit occurred during the SARS-CoV-2 public health emergency.  Safety protocols were in place, including screening questions prior to the visit, additional usage of staff PPE, and extensive cleaning of exam room while observing appropriate contact time as indicated for disinfecting solutions.   Ashley [name] is a 54 year old female with a history of TIA, hypertension, osteoarthritis, hyperlipidemia, vitamin B12 and D deficiency, numbness and tingling who presents today with a chief complaint of flank pain/abdominal pain and joint pain.  1) Flank Pain/Abdominal Pain: Chronic for 3-4 months. Recently evaluated at [facility] ED on 08/16/19 for complaints of right flank and right lower quadrant pain with nausea. She underwent pelvic and transvaginal ultrasound, CT renal protocol, lab work which were grossly unremarkable. She was suspected to have gas/constipation that was contributing to symptoms. She was told to take Miralax once for laxative.  Today she endorses having bowel movements every two days for months, this is different from her regular daily movements. She continues to have right lateral abdominal/side pain, right lower back pain for which she describes as an intermittent, dull pain. Her symptoms are relieved temporarily when she has a bowel movement.   She's not using Miralax, she has taken one dose of Colace with some improvement. She's stopped using doxycycline for acne. She denies injury/trauma, numbness/tingling.   2) Joint/Muscle Pain: Located to lower extremities including ankles, upper extremities including wrists, with muscle spasms to lower extremities. This all began several months ago, intermittent. She is managed on rosuvastatin for which she takes daily. She denies swelling in the joints.    BP Readings from Last 3 Encounters:  09/01/19 124/84  08/16/19 (!) 153/96    08/16/19 (!) 143/88     Review of Systems  Constitutional: Negative for fever.  Gastrointestinal: Positive for abdominal pain and constipation. Negative for blood in stool and vomiting.  Musculoskeletal: Positive for arthralgias and myalgias. Negative for joint swelling.  Allergic/Immunologic: Positive for environmental allergies.       Past Medical History:  Diagnosis Date  . Anemia   . Bronchitis    2 years ago   . [place] arthritis 2019   base of left thumb   . GERD (gastroesophageal reflux disease)    diet controlled , no meds  . Hydrosalpinx    bilateral   . Hyperlipidemia    recent dx - currently diet control - no meds  . Hypothyroid 2003   radio active iodine - no meds  . Left-sided weakness 02/12/2019  . SVD (spontaneous vaginal delivery)    x 1  . Urinary frequency 11/30/2018  . Vitamin B 12 deficiency      Social History   Socioeconomic History  . Marital status: Single    Spouse name: Not on file  . Number of children: Not on file  . Years of education: Not on file  . Highest education level: Not on file  Occupational History  . Not on file  Tobacco Use  . Smoking status: Never Smoker  . Smokeless tobacco: Never Used  Vaping Use  . Vaping Use: Never used  Substance and Sexual Activity  . Alcohol use: No  . Drug use: No  . Sexual activity: Yes    Partners: Male    Birth control/protection: Surgical    Comment: hysterectomy.  Other Topics Concern  . Not on file  Social  History Narrative   Single.   3 children.   Works at [employer] as a Designer, multimedia.   Enjoys relaxing, exercise, walking her dog.    Social Determinants of Health   Financial Resource Strain:   . Difficulty of Paying Living Expenses:   Food Insecurity:   . Worried About Charity fundraiser in the Last Year:   . Arboriculturist in the Last Year:   Transportation Needs:   . Film/video editor (Medical):   Marland Kitchen Lack of Transportation (Non-Medical):   Physical Activity:   . Days of  Exercise per Week:   . Minutes of Exercise per Session:   Stress:   . Feeling of Stress :   Social Connections:   . Frequency of Communication with Friends and Family:   . Frequency of Social Gatherings with Friends and Family:   . Attends Religious Services:   . Active Member of Clubs or Organizations:   . Attends Archivist Meetings:   Marland Kitchen Marital Status:   Intimate Partner Violence:   . Fear of Current or Ex-Partner:   . Emotionally Abused:   Marland Kitchen Physically Abused:   . Sexually Abused:     Past Surgical History:  Procedure Laterality Date  . CESAREAN SECTION     x 2  . CHOLECYSTECTOMY  2008  . DILATION AND CURETTAGE OF UTERUS     X 2  . LAPAROSCOPIC BILATERAL SALPINGECTOMY Bilateral 07/29/2017   Procedure: LAPAROSCOPIC LEFT SALPINGOOPHRECTOMY: Partial right Salpingectomy;  Surgeon: Lavonia Drafts, MD;  Location: [place];  Service: Gynecology;  Laterality: Bilateral;  . SHOULDER SURGERY  2016   arthroscopic  . TOTAL ABDOMINAL HYSTERECTOMY  2006   Fibroids, benign pathology; partial hysterectomy per patient   . TUBAL LIGATION    . WISDOM TOOTH EXTRACTION     at age 68 yr  . WRIST ARTHROSCOPY Right 2007    Family History  Problem Relation Age of Onset  . Hypertension Mother   . Breast cancer Maternal Aunt   . Hypertension Maternal Grandmother   . Heart failure Maternal Grandmother   . Lung cancer Maternal Grandmother   . Other Father        unknown medical history  . Colon cancer Neg Hx   . Colon polyps Neg Hx   . Rectal cancer Neg Hx   . Stomach cancer Neg Hx     Allergies  Allergen Reactions  . Levaquin [Levofloxacin]     Presumed cause of joint aches    Current Outpatient Medications on File Prior to Visit  Medication Sig Dispense Refill  . amLODipine (NORVASC) 5 MG tablet Take 1 tablet (5 mg total) by mouth daily. For blood pressure. 90 tablet 2  . doxycycline (VIBRAMYCIN) 50 MG capsule Take 50 mg by mouth daily.    .  rosuvastatin (CRESTOR) 5 MG tablet Take 1 tablet (5 mg total) by mouth every evening. For cholesterol. 90 tablet 3   No current facility-administered medications on file prior to visit.    BP 124/84   Pulse 86   Temp (!) 95.9 F (35.5 C) (Temporal)   Ht 5\' 6"  (1.676 m)   Wt 166 lb 12 oz (75.6 kg)   SpO2 98%   BMI 26.91 kg/m    Objective:   Physical Exam Cardiovascular:     Rate and Rhythm: Normal rate and regular rhythm.  Pulmonary:     Effort: Pulmonary effort is normal.     Breath sounds:  Normal breath sounds.  Abdominal:     General: Abdomen is flat. Bowel sounds are normal.     Palpations: Abdomen is soft.     Tenderness: There is abdominal tenderness in the right upper quadrant and right lower quadrant. There is no right CVA tenderness or left CVA tenderness.    Musculoskeletal:     Cervical back: Neck supple.  Skin:    General: Skin is warm and dry.  Neurological:     Mental Status: She is alert.            Assessment & Plan:

## 2019-09-10 ENCOUNTER — Encounter: Payer: Self-pay | Admitting: Primary Care

## 2019-09-10 ENCOUNTER — Ambulatory Visit (INDEPENDENT_AMBULATORY_CARE_PROVIDER_SITE_OTHER): Payer: 59 | Admitting: Primary Care

## 2019-09-10 ENCOUNTER — Ambulatory Visit: Payer: 59 | Admitting: Family Medicine

## 2019-09-10 ENCOUNTER — Other Ambulatory Visit: Payer: Self-pay

## 2019-09-10 VITALS — BP 144/90 | HR 83 | Temp 95.6°F | Ht 66.0 in | Wt 171.0 lb

## 2019-09-10 DIAGNOSIS — M545 Low back pain, unspecified: Secondary | ICD-10-CM

## 2019-09-10 DIAGNOSIS — G8929 Other chronic pain: Secondary | ICD-10-CM | POA: Insufficient documentation

## 2019-09-10 DIAGNOSIS — R350 Frequency of micturition: Secondary | ICD-10-CM | POA: Diagnosis not present

## 2019-09-10 HISTORY — DX: Low back pain, unspecified: M54.50

## 2019-09-10 LAB — POC URINALSYSI DIPSTICK (AUTOMATED)
Bilirubin, UA: NEGATIVE
Blood, UA: NEGATIVE
Glucose, UA: NEGATIVE
Ketones, UA: NEGATIVE
Nitrite, UA: POSITIVE
Protein, UA: NEGATIVE
Spec Grav, UA: 1.01 (ref 1.010–1.025)
Urobilinogen, UA: 1 E.U./dL
pH, UA: 6.5 (ref 5.0–8.0)

## 2019-09-10 MED ORDER — CYCLOBENZAPRINE HCL 5 MG PO TABS: 5.0000 mg | ORAL_TABLET | Freq: Three times a day (TID) | ORAL | 0 refills | Status: DC | PRN

## 2019-09-10 NOTE — Progress Notes (Signed)
Subjective:    Patient ID: Ashley [name], female    DOB: 05-30-1965, 54 y.o.   MRN: [id number]  HPI  This visit occurred during the SARS-CoV-2 public health emergency.  Safety protocols were in place, including screening questions prior to the visit, additional usage of staff PPE, and extensive cleaning of exam room while observing appropriate contact time as indicated for disinfecting solutions.   Ashley [name] is a 54 year old female with a history of hypertension, TIA, GERD, hyperlipidemia who presents today with a chief complaint of urinary frequency.  She also reports bilateral lower back pain with radiation up to the mid thoracic spine which began several days ago, increased over the last 24 hours. She describes her pain as pressure, not achy or sharp/shooting. She does more sitting than walking/standing at work.   Symptoms of urinary frequency and suprapubic pain began today. She took AZO and Tylenol this morning, and has been increasing water intake today. Is feeling slightly better now.   She denies numbness/tingling to lower extremities, loss of bowel/bladder control.   Review of Systems  Constitutional: Negative for fever.  Gastrointestinal: Negative for abdominal pain.  Genitourinary: Positive for frequency and pelvic pain. Negative for dysuria, flank pain, urgency and vaginal discharge.  Musculoskeletal: Positive for back pain.       Past Medical History:  Diagnosis Date  . Anemia   . Bronchitis    2 years ago   . [place] arthritis 2019   base of left thumb   . GERD (gastroesophageal reflux disease)    diet controlled , no meds  . Hydrosalpinx    bilateral   . Hyperlipidemia    recent dx - currently diet control - no meds  . Hypothyroid 2003   radio active iodine - no meds  . Left-sided weakness 02/12/2019  . SVD (spontaneous vaginal delivery)    x 1  . Urinary frequency 11/30/2018  . Vitamin B 12 deficiency      Social History   Socioeconomic History   . Marital status: Single    Spouse name: Not on file  . Number of children: Not on file  . Years of education: Not on file  . Highest education level: Not on file  Occupational History  . Not on file  Tobacco Use  . Smoking status: Never Smoker  . Smokeless tobacco: Never Used  Vaping Use  . Vaping Use: Never used  Substance and Sexual Activity  . Alcohol use: No  . Drug use: No  . Sexual activity: Yes    Partners: Male    Birth control/protection: Surgical    Comment: hysterectomy.  Other Topics Concern  . Not on file  Social History Narrative   Single.   3 children.   Works at [employer] as a Designer, multimedia.   Enjoys relaxing, exercise, walking her dog.    Social Determinants of Health   Financial Resource Strain:   . Difficulty of Paying Living Expenses:   Food Insecurity:   . Worried About Charity fundraiser in the Last Year:   . Arboriculturist in the Last Year:   Transportation Needs:   . Film/video editor (Medical):   Marland Kitchen Lack of Transportation (Non-Medical):   Physical Activity:   . Days of Exercise per Week:   . Minutes of Exercise per Session:   Stress:   . Feeling of Stress :   Social Connections:   . Frequency of Communication with Friends and Family:   .  Frequency of Social Gatherings with Friends and Family:   . Attends Religious Services:   . Active Member of Clubs or Organizations:   . Attends Archivist Meetings:   Marland Kitchen Marital Status:   Intimate Partner Violence:   . Fear of Current or Ex-Partner:   . Emotionally Abused:   Marland Kitchen Physically Abused:   . Sexually Abused:     Past Surgical History:  Procedure Laterality Date  . CESAREAN SECTION     x 2  . CHOLECYSTECTOMY  2008  . DILATION AND CURETTAGE OF UTERUS     X 2  . LAPAROSCOPIC BILATERAL SALPINGECTOMY Bilateral 07/29/2017   Procedure: LAPAROSCOPIC LEFT SALPINGOOPHRECTOMY: Partial right Salpingectomy;  Surgeon: Lavonia Drafts, MD;  Location: [facility];  Service:  Gynecology;  Laterality: Bilateral;  . SHOULDER SURGERY  2016   arthroscopic  . TOTAL ABDOMINAL HYSTERECTOMY  2006   Fibroids, benign pathology; partial hysterectomy per patient   . TUBAL LIGATION    . WISDOM TOOTH EXTRACTION     at age 54 yr  . WRIST ARTHROSCOPY Right 2007    Family History  Problem Relation Age of Onset  . Hypertension Mother   . Breast cancer Maternal Aunt   . Hypertension Maternal Grandmother   . Heart failure Maternal Grandmother   . Lung cancer Maternal Grandmother   . Other Father        unknown medical history  . Colon cancer Neg Hx   . Colon polyps Neg Hx   . Rectal cancer Neg Hx   . Stomach cancer Neg Hx     Allergies  Allergen Reactions  . Levaquin [Levofloxacin]     Presumed cause of joint aches    Current Outpatient Medications on File Prior to Visit  Medication Sig Dispense Refill  . amLODipine (NORVASC) 5 MG tablet Take 1 tablet (5 mg total) by mouth daily. For blood pressure. 90 tablet 2  . fluticasone (FLONASE) 50 MCG/ACT nasal spray Place 1 spray into both nostrils 2 (two) times daily. 16 g 0  . rosuvastatin (CRESTOR) 5 MG tablet Take 1 tablet (5 mg total) by mouth every evening. For cholesterol. 90 tablet 3   No current facility-administered medications on file prior to visit.    BP (!) 144/90   Pulse 83   Temp (!) 95.6 F (35.3 C) (Temporal)   Ht 5\' 6"  (1.676 m)   Wt 171 lb (77.6 kg)   SpO2 98%   BMI 27.60 kg/m    Objective:   Physical Exam Cardiovascular:     Rate and Rhythm: Normal rate.  Pulmonary:     Effort: Pulmonary effort is normal.  Abdominal:     General: Abdomen is flat. Bowel sounds are normal.     Palpations: Abdomen is soft.     Tenderness: There is abdominal tenderness in the suprapubic area. There is no right CVA tenderness or left CVA tenderness.  Musculoskeletal:     Lumbar back: No tenderness or bony tenderness. Decreased range of motion.       Back:     Comments: Pain with decrease in ROM with  flexion and lateral rotation.   Neurological:     Mental Status: She is alert.            Assessment & Plan:

## 2019-09-10 NOTE — Patient Instructions (Signed)
You may take cyclobenzaprine 5 mg every 8 hours as needed for muscle spasms. This may cause drowsiness.  Continue Tylenol 500 mg every 8 hours for pain.  We will notify you once we receive the urine culture results.  Work on stretching and walking when possible. Continue to drink plenty of water.  It was a pleasure to see you today!

## 2019-09-10 NOTE — Assessment & Plan Note (Signed)
Acute today, improved with AZO. UA today with trace leuks, positive nitrites. No blood or glucose.  Culture sent. Await results prior to treatment.

## 2019-09-10 NOTE — Assessment & Plan Note (Signed)
Acute for the last several days, worse yesterday, better today after Tylenol.  Unclear if back pain is secondary to cystitis or MSK cause. UA today collected, culture pending.  She can reproduce her back pain with movement, so likely MSK etiology.  Continue Tylenol. Rx for cyclobenzaprine sent to pharmacy.   She will update if no improvement.

## 2019-09-11 LAB — URINE CULTURE
MICRO NUMBER:: 10715148
Result:: NO GROWTH
SPECIMEN QUALITY:: ADEQUATE

## 2019-09-21 DIAGNOSIS — M545 Low back pain, unspecified: Secondary | ICD-10-CM

## 2019-09-21 DIAGNOSIS — E538 Deficiency of other specified B group vitamins: Secondary | ICD-10-CM

## 2019-09-21 DIAGNOSIS — R202 Paresthesia of skin: Secondary | ICD-10-CM

## 2019-09-21 DIAGNOSIS — R200 Anesthesia of skin: Secondary | ICD-10-CM

## 2019-09-24 ENCOUNTER — Other Ambulatory Visit: Payer: Self-pay

## 2019-09-24 ENCOUNTER — Ambulatory Visit (INDEPENDENT_AMBULATORY_CARE_PROVIDER_SITE_OTHER)
Admission: RE | Admit: 2019-09-24 | Discharge: 2019-09-24 | Disposition: A | Discharge status: Home / Self Care | Payer: 59 | Source: Ambulatory Visit | Attending: Primary Care | Admitting: Primary Care

## 2019-09-24 ENCOUNTER — Other Ambulatory Visit (INDEPENDENT_AMBULATORY_CARE_PROVIDER_SITE_OTHER): Payer: 59

## 2019-09-24 DIAGNOSIS — M545 Low back pain, unspecified: Secondary | ICD-10-CM

## 2019-09-24 LAB — POC URINALSYSI DIPSTICK (AUTOMATED)
Bilirubin, UA: NEGATIVE
Blood, UA: NEGATIVE
Glucose, UA: NEGATIVE
Ketones, UA: NEGATIVE
Leukocytes, UA: NEGATIVE
Nitrite, UA: NEGATIVE
Protein, UA: NEGATIVE
Spec Grav, UA: 1.015 (ref 1.010–1.025)
Urobilinogen, UA: 0.2 E.U./dL
pH, UA: 6.5 (ref 5.0–8.0)

## 2019-09-25 LAB — URINE CULTURE
MICRO NUMBER:: 10770298
SPECIMEN QUALITY:: ADEQUATE

## 2019-09-28 ENCOUNTER — Ambulatory Visit (HOSPITAL_COMMUNITY)
Admission: EM | Admit: 2019-09-28 | Discharge: 2019-09-28 | Disposition: A | Discharge status: Home / Self Care | Payer: 59

## 2019-09-28 ENCOUNTER — Encounter (HOSPITAL_COMMUNITY): Payer: Self-pay | Admitting: Urgent Care

## 2019-09-28 ENCOUNTER — Other Ambulatory Visit: Payer: Self-pay

## 2019-09-28 DIAGNOSIS — M79661 Pain in right lower leg: Secondary | ICD-10-CM

## 2019-09-28 DIAGNOSIS — M7989 Other specified soft tissue disorders: Secondary | ICD-10-CM

## 2019-09-28 DIAGNOSIS — M79662 Pain in left lower leg: Secondary | ICD-10-CM

## 2019-09-28 DIAGNOSIS — R030 Elevated blood-pressure reading, without diagnosis of hypertension: Secondary | ICD-10-CM

## 2019-09-28 DIAGNOSIS — I10 Essential (primary) hypertension: Secondary | ICD-10-CM

## 2019-09-28 MED ORDER — TIZANIDINE HCL 4 MG PO TABS: 4.0000 mg | ORAL_TABLET | Freq: Three times a day (TID) | ORAL | 0 refills | Status: DC | PRN

## 2019-09-28 MED ORDER — MELOXICAM 7.5 MG PO TABS
7.5000 mg | ORAL_TABLET | Freq: Every day | ORAL | 0 refills | Status: DC
Start: 2019-09-28 — End: 2019-11-19

## 2019-09-28 NOTE — Telephone Encounter (Signed)
Noted  

## 2019-09-28 NOTE — ED Triage Notes (Signed)
Pt is here with bilateral leg swelling that started yesterday, pt has taken Advil, Motrin also elevated legs to relieve discomfort. Pt states her swelling went down when elevating her legs.

## 2019-09-28 NOTE — ED Provider Notes (Signed)
[facility]   MRN: [id number] DOB: [birth year]  Subjective:   Ashley [name] is a 54 y.o. female presenting for 1 day history of new onset bilateral lower leg swelling and pain over the front of her legs.  Patient states that she has been working through persistent moderate to severe lower back pain and is making her walk differently.  She has been getting work-up done with her PCP, had negative imaging recently.  States that the pain is severe enough that it forces her to stand most of her shift or walk.  Normally she has a sedentary position.  She does bear more weight on her left leg and that his leg hurts more.  Denies history of DVT.  Denies warmth, erythema, calf pain, calf swelling, shortness of breath, chest pain.  Denies falls, trauma.  A few months ago, patient did have new onset constipation that was severe.  Has had a colonoscopy.  Plans on getting a follow-up appointment with her gynecologist.  No current facility-administered medications for this encounter.  Current Outpatient Medications:    amLODipine (NORVASC) 5 MG tablet, Take 1 tablet (5 mg total) by mouth daily. For blood pressure., Disp: 90 tablet, Rfl: 2   cyclobenzaprine (FLEXERIL) 5 MG tablet, Take 1 tablet (5 mg total) by mouth 3 (three) times daily as needed for muscle spasms., Disp: 15 tablet, Rfl: 0   fluticasone (FLONASE) 50 MCG/ACT nasal spray, Place 1 spray into both nostrils 2 (two) times daily., Disp: 16 g, Rfl: 0   rosuvastatin (CRESTOR) 5 MG tablet, Take 1 tablet (5 mg total) by mouth every evening. For cholesterol., Disp: 90 tablet, Rfl: 3   Allergies  Allergen Reactions   Levaquin [Levofloxacin]     Presumed cause of joint aches    Past Medical History:  Diagnosis Date   Anemia    Bronchitis    2 years ago    [place] arthritis 2019   base of left thumb    GERD (gastroesophageal reflux disease)    diet controlled , no meds   Hydrosalpinx    bilateral    Hyperlipidemia     recent dx - currently diet control - no meds   Hypothyroid 2003   radio active iodine - no meds   Left-sided weakness 02/12/2019   SVD (spontaneous vaginal delivery)    x 1   Urinary frequency 11/30/2018   Vitamin B 12 deficiency      Past Surgical History:  Procedure Laterality Date   CESAREAN SECTION     x 2   CHOLECYSTECTOMY  2008   DILATION AND CURETTAGE OF UTERUS     X 2   LAPAROSCOPIC BILATERAL SALPINGECTOMY Bilateral 07/29/2017   Procedure: LAPAROSCOPIC LEFT SALPINGOOPHRECTOMY: Partial right Salpingectomy;  Surgeon: Lavonia Drafts, MD;  Location: [facility];  Service: Gynecology;  Laterality: Bilateral;   SHOULDER SURGERY  2016   arthroscopic   TOTAL ABDOMINAL HYSTERECTOMY  2006   Fibroids, benign pathology; partial hysterectomy per patient    TUBAL LIGATION     WISDOM TOOTH EXTRACTION     at age 80 yr   WRIST ARTHROSCOPY Right 2007    Family History  Problem Relation Age of Onset   Hypertension Mother    Breast cancer Maternal Aunt    Hypertension Maternal Grandmother    Heart failure Maternal Grandmother    Lung cancer Maternal Grandmother    Other Father        unknown medical history   Colon cancer  Neg Hx    Colon polyps Neg Hx    Rectal cancer Neg Hx    Stomach cancer Neg Hx     Social History   Tobacco Use   Smoking status: Never Smoker   Smokeless tobacco: Never Used  Vaping Use   Vaping Use: Never used  Substance Use Topics   Alcohol use: No   Drug use: No    ROS   Objective:   Vitals: BP (!) 159/78 (BP Location: Right Arm)    Pulse 88    Temp 98.3 F (36.8 C) (Oral)    Resp 16    SpO2 98%   Physical Exam Constitutional:      General: She is not in acute distress.    Appearance: Normal appearance. She is well-developed. She is not ill-appearing, toxic-appearing or diaphoretic.  HENT:     Head: Normocephalic and atraumatic.     Nose: Nose normal.     Mouth/Throat:     Mouth:  Mucous membranes are moist.     Pharynx: Oropharynx is clear.  Eyes:     General: No scleral icterus.       Right eye: No discharge.        Left eye: No discharge.     Extraocular Movements: Extraocular movements intact.     Conjunctiva/sclera: Conjunctivae normal.     Pupils: Pupils are equal, round, and reactive to light.  Cardiovascular:     Rate and Rhythm: Normal rate.  Pulmonary:     Effort: Pulmonary effort is normal.  Musculoskeletal:     Right lower leg: Swelling (Trace) and tenderness present. No deformity, lacerations or bony tenderness. No edema.     Left lower leg: Swelling (1+) and tenderness present. No deformity, lacerations or bony tenderness. No edema.     Right ankle: No swelling, deformity, ecchymosis or lacerations. No tenderness. Normal range of motion.     Left ankle: No swelling, deformity, ecchymosis or lacerations. No tenderness. Normal range of motion.       Legs:     Comments: Strength 5/5 for both lower extremities.  Skin:    General: Skin is warm and dry.  Neurological:     General: No focal deficit present.     Mental Status: She is alert and oriented to person, place, and time.     Motor: No weakness.     Coordination: Coordination normal.     Gait: Gait normal.     Deep Tendon Reflexes: Reflexes normal.  Psychiatric:        Mood and Affect: Mood normal.        Behavior: Behavior normal.        Thought Content: Thought content normal.        Judgment: Judgment normal.      Assessment and Plan :   PDMP not reviewed this encounter.  1. Pain in both lower legs   2. Leg swelling   3. Essential hypertension   4. Elevated blood pressure reading     Suspect musculoskeletal pain related to compensating for back pain.  Low suspicion for DVT given physical exam findings.  Start meloxicam, tizanidine for musculoskeletal type pain.  Recommend she continue work-up with her gynecologist and PCP. Counseled patient on potential for adverse effects with  medications prescribed/recommended today, ER and return-to-clinic precautions discussed, patient verbalized understanding.    Jaynee Eagles, PA-C 09/30/19 1012

## 2019-09-28 NOTE — Telephone Encounter (Signed)
I spoke with pt; started on 09/27/19 with swelling in lt lower leg and ankle; last night elevated leg and iced the area and swelling this morning was still there and the swelling had not gone down. No pain or redness, but does feel warm to the touch. No CP or SOB. No known injury; never had this type swelling before. Pt is concerned what might be going on. No available appts at [facility] on 09/28/19 and 09/29/19. Pt said when she gets off work today she is going by [facility] on [address] to Gentry Fitz NP.

## 2019-09-29 NOTE — Telephone Encounter (Signed)
[facility] - Client TELEPHONE ADVICE RECORD Access[facility]urse Patient Name: IRIE DOWSON Gender: Female DOB: 1965-03-05 Age: 54 Y 77 M 6 D Return Phone Number: [phone] (Primary) Address: City/State/Zip: [place] [address] Client [facility] - Client Client Site [place] - Day Physician Alma Friendly - NP Contact Type Call Who Is Calling Patient / Member / Family / Caregiver Call Type Triage / Clinical Relationship To Patient Self Return Phone Number [phone] (Primary) Chief Complaint Leg Pain Reason for Call Symptomatic / Request for Health Information Initial Comment Caller has swelling and tenderness in left leg. It is near her shin. It started yesterday, she elevated, and put ice on it. Translation No Nurse Assessment Nurse: Earley Favor, RN, Suanne Marker Date/Time (Eastern Time): 09/28/2019 3:25:55 PM Confirm and document reason for call. If symptomatic, describe symptoms. ---Caller has swelling in L leg; from shin to ankle. No swelling of calf. It started yesterday. No hx of injury. Has the patient had close contact with a person known or suspected to have the novel coronavirus illness OR traveled / lives in area with major community spread (including international travel) in the last 14 days from the onset of symptoms? * If Asymptomatic, screen for exposure and travel within the last 14 days. ---No Does the patient have any new or worsening symptoms? ---Yes Will a triage be completed? ---Yes Related visit to physician within the last 2 weeks? ---No Does the PT have any chronic conditions? (i.e. diabetes, asthma, this includes High risk factors for pregnancy, etc.) ---Yes List chronic conditions. ---HTN Is the patient pregnant or possibly pregnant? (Ask all females between the ages of 46-55) ---No Is this a behavioral health or substance abuse call? ---No Guidelines Guideline Title Affirmed  Question Affirmed Notes Nurse Date/Time Eilene Ghazi Time) Leg Swelling and Edema [1] Thigh, calf, or ankle swelling AND [2] only 1 side Earley Favor, RN, Suanne Marker 09/28/2019 3:28:10 PM Disp. Time Eilene Ghazi Time) Disposition Final User PLEASE NOTE: All timestamps contained within this report are represented as [place] Standard Time. CONFIDENTIALTY NOTICE: This fax transmission is intended only for the addressee. It contains information that is legally privileged, confidential or otherwise protected from use or disclosure. If you are not the intended recipient, you are strictly prohibited from reviewing, disclosing, copying using or disseminating any of this information or taking any action in reliance on or regarding this information. If you have received this fax in error, please notify us immediately by telephone so that we can arrange for its return to [place]. Phone: [phone], Toll-Free: [phone], Fax: [phone] Page: 2 of 2 Call Id: [id number] 09/28/2019 3:35:03 PM See HCP within 4 Hours (or PCP triage) Yes Earley Favor, RN, Rosalyn Charters Disagree/Comply Comply Caller Understands Yes PreDisposition Did not know what to do Care Advice Given Per Guideline CALL EMS IF: * Chest pain or shortness of breath occurs. CARE ADVICE given per Leg Swelling and Edema (Adult) guideline. * IF OFFICE WILL BE CLOSED AND NO PCP (PRIMARY CARE PROVIDER) SECOND-LEVEL TRIAGE: You need to be seen within the next 3 or 4 hours. A nearby Urgent Care Center [facility]) is often a good source of care. Another choice is to go to the ED. Go sooner if you become worse. Comments User: Len Blalock, RN Date/Time Eilene Ghazi Time): 09/28/2019 3:34:56 PM Called backline - no appts available this afternoon Referrals GO TO FACILITY UNDECIDED

## 2019-09-29 NOTE — Telephone Encounter (Signed)
Per chart review tab pt went to [facility] on 09/28/19.

## 2019-10-05 ENCOUNTER — Telehealth: Payer: Self-pay

## 2019-10-05 NOTE — Telephone Encounter (Signed)
Noted, with ER cautions given to patient.  Will see at [facility].  Thanks.

## 2019-10-05 NOTE — Telephone Encounter (Addendum)
Raquel Sarna front office mgr teamed me that pt had scheduled an appt thru my chart and scheduled appt on 10/07/19 at 8 AM for lightheadedness, change in vision, discoloration of bottom of feet. The note also had that [place] is [place] out of [place]. I was unable to speak with pt and left v/m requesting pt to cb to [facility] triage. Pt called back; pt said the lightheadedness has gone on and off for 2 weeks. The discoloration of soles of feet was noticed 2 days ago. Pt said on and off both feet pts toes go to sleep on and off. Pt has had change in vision for the past wk; pt wears glasses for distance and when watches TV she has double vision on and off. No H/A and no weakness in arms or legs. No CP or SOB. Pt has no covid symptoms,  and no known exposure to + covid. Pt went to [place] last weekend; pt did wear a mask and social distanced and traveled via car. Pt has had covid vaccines.  Pt is OK right now and will keep the appt already scheduled in office on 10/07/19 at 8:00. If pt condition changes or worsens pt will go to [facility] or ED. FYI Dr Damita Dunnings.

## 2019-10-07 ENCOUNTER — Ambulatory Visit (INDEPENDENT_AMBULATORY_CARE_PROVIDER_SITE_OTHER): Payer: 59 | Admitting: Family Medicine

## 2019-10-07 ENCOUNTER — Encounter: Payer: Self-pay | Admitting: Family Medicine

## 2019-10-07 ENCOUNTER — Other Ambulatory Visit: Payer: Self-pay

## 2019-10-07 VITALS — BP 146/84 | HR 101 | Temp 96.5°F | Ht 66.0 in | Wt 167.5 lb

## 2019-10-07 DIAGNOSIS — E538 Deficiency of other specified B group vitamins: Secondary | ICD-10-CM

## 2019-10-07 DIAGNOSIS — R42 Dizziness and giddiness: Secondary | ICD-10-CM | POA: Insufficient documentation

## 2019-10-07 DIAGNOSIS — E559 Vitamin D deficiency, unspecified: Secondary | ICD-10-CM

## 2019-10-07 LAB — BASIC METABOLIC PANEL
BUN: 10 mg/dL (ref 6–23)
CO2: 30 mEq/L (ref 19–32)
Calcium: 10.1 mg/dL (ref 8.4–10.5)
Chloride: 104 mEq/L (ref 96–112)
Creatinine, Ser: 0.73 mg/dL (ref 0.40–1.20)
GFR: 100.29 mL/min (ref >60.00)
Glucose, Bld: 93 mg/dL (ref 70–99)
Potassium: 4.5 mEq/L (ref 3.5–5.1)
Sodium: 140 mEq/L (ref 135–145)

## 2019-10-07 LAB — CBC WITH DIFFERENTIAL/PLATELET
Basophils Absolute: 0 K/uL (ref 0.0–0.1)
Basophils Relative: 1.5 % (ref 0.0–3.0)
Eosinophils Absolute: 0 K/uL (ref 0.0–0.7)
Eosinophils Relative: 0.9 % (ref 0.0–5.0)
HCT: 38.3 % (ref 36.0–46.0)
Hemoglobin: 12.5 g/dL (ref 12.0–15.0)
Lymphocytes Relative: 32.5 % (ref 12.0–46.0)
Lymphs Abs: 1 K/uL (ref 0.7–4.0)
MCHC: 32.7 g/dL (ref 30.0–36.0)
MCV: 86.8 fl (ref 78.0–100.0)
Monocytes Absolute: 0.2 K/uL (ref 0.1–1.0)
Monocytes Relative: 7.8 % (ref 3.0–12.0)
Neutro Abs: 1.7 K/uL (ref 1.4–7.7)
Neutrophils Relative %: 57.3 % (ref 43.0–77.0)
Platelets: 236 K/uL (ref 150.0–400.0)
RBC: 4.41 Mil/uL (ref 3.87–5.11)
RDW: 16 % — ABNORMAL HIGH (ref 11.5–15.5)
WBC: 3 K/uL — ABNORMAL LOW (ref 4.0–10.5)

## 2019-10-07 LAB — VITAMIN D 25 HYDROXY (VIT D DEFICIENCY, FRACTURES): VITD: 34.33 ng/mL (ref 30.00–100.00)

## 2019-10-07 LAB — VITAMIN B12: Vitamin B-12: 1478 pg/mL — ABNORMAL HIGH (ref 211–911)

## 2019-10-07 NOTE — Patient Instructions (Signed)
Use the home vertigo exercises.  Go to the lab on the way out.   If you have mychart we'll likely use that to update you.    I wouldn't change your meds yet but you may need to decrease your amlodipine if the lightheadedness continues.   Take care.  Glad to see you.

## 2019-10-07 NOTE — Progress Notes (Signed)
This visit occurred during the SARS-CoV-2 public health emergency.  Safety protocols were in place, including screening questions prior to the visit, additional usage of staff PPE, and extensive cleaning of exam room while observing appropriate contact time as indicated for disinfecting solutions.  Back pain is better but she had some BLE edema.  Still on amlodipine, which could contribute.    She had vertigo x2, lasts a few minutes then resolves, when getting out of bed.  She has h/o mild lightheadedness.  Those sx were separate. She had h/o vit D and B12.  No HA.  No FCNAVD.  She has more of a need for reading glasses now.  No vision loss.    No syncope.  She has some L sided tingling- at the toes.  No extremity weakness.  Not SOB.    LE edema improves with elevation and early in the AM.    She is moving to [place] this weekend.    Meds, vitals, and allergies reviewed.   ROS: Per HPI unless specifically indicated in ROS section   GEN: nad, alert and oriented HEENT: ncat, TM wnl B NECK: supple w/o LA CV: rrr.  no murmur PULM: ctab, no inc wob ABD: soft, +bs EXT: no edema SKIN: well perfused.   CN 2-12 wnl B, S/S wnl x4 except for longstanding B heel dec in sensation- this is not a new finding.  DHP positive with sitting up and moving head from right to midline.  No sx with sitting up w/o head movement.

## 2019-10-07 NOTE — Assessment & Plan Note (Signed)
Sx separate from being lightheaded.  Likely BPV, d/w pt.  Reasonable to check basic labs, d/w pt, esp given h/o B12 def and D def.  She agrees.  No change in meds now.  Use home exercise, demonstrated.  Update [place] as needed.  Can get set up with new MD in [place].  Check labs given lightheadedness.  Cautions d/w pt.  May need to change amlodipine if persistent.  No orthostatic sx on testing today.  No edema now, noted early AM exam.  she agrees with plan.      At least 30 minutes were devoted to patient care in this encounter (this can potentially include time spent reviewing the patient's file/history, interviewing and examining the patient, counseling/reviewing plan with patient, ordering referrals, ordering tests, reviewing relevant laboratory or x-ray data, and documenting the encounter).

## 2019-10-08 ENCOUNTER — Ambulatory Visit
Admission: EM | Admit: 2019-10-08 | Discharge: 2019-10-08 | Disposition: A | Discharge status: Home / Self Care | Payer: 59 | Attending: Emergency Medicine | Admitting: Emergency Medicine

## 2019-10-08 ENCOUNTER — Encounter: Payer: Self-pay | Admitting: Family Medicine

## 2019-10-08 DIAGNOSIS — R42 Dizziness and giddiness: Secondary | ICD-10-CM

## 2019-10-08 DIAGNOSIS — I10 Essential (primary) hypertension: Secondary | ICD-10-CM

## 2019-10-08 DIAGNOSIS — R202 Paresthesia of skin: Secondary | ICD-10-CM | POA: Diagnosis not present

## 2019-10-08 NOTE — Telephone Encounter (Signed)
Pt is concerned about results. I let her know that as soon as they have been resulted by her provider we will give her a call.

## 2019-10-08 NOTE — ED Triage Notes (Signed)
Patient reports she has been experiencing intermittent dizziness, tingling sensation in extremities, and nausea for at least one month now. Patient was seen in her PCPs office yesterday, where blood was drawn.

## 2019-10-08 NOTE — ED Provider Notes (Signed)
Ashley [name]    CSN: [id number] Arrival date & time: 10/08/19  1635      History   Chief Complaint Chief Complaint  Patient presents with  . Dizziness  . Nausea  . Tingling    HPI Ashley [name] is a 54 y.o. female.   Patient presents with 1 month history of arthralgias, "burning" sensation in her extremities, numbness in her toes, nausea, and dizziness.  She states her symptoms are intermittent, with no identified alleviating or aggravating factors.  She denies fever, focal weakness, chest pain, shortness of breath, abdominal pain, or other symptoms.  Patient has been taking OTC vitamin B supplements.    She was seen by her PCP yesterday; diagnoses were vitamin B12 deficiency, vitamin D deficiency, lightheadedness, and vertigo.  She was seen at the urgent care in [place] on 09/28/2019; diagnosed with pain and swelling in her lower legs and elevated blood pressure reading.  She was seen by her neurologist on 09/16/2019; diagnosed with numbness and tingling, arthralgia of the right knee, and cervical pain.   Patient is moving to [place] tomorrow.  The history is provided by the patient.    Past Medical History:  Diagnosis Date  . Anemia   . Bronchitis    2 years ago   . [place] arthritis 2019   base of left thumb   . GERD (gastroesophageal reflux disease)    diet controlled , no meds  . Hydrosalpinx    bilateral   . Hyperlipidemia    recent dx - currently diet control - no meds  . Hypothyroid 2003   radio active iodine - no meds  . Left-sided weakness 02/12/2019  . SVD (spontaneous vaginal delivery)    x 1  . Urinary frequency 11/30/2018  . Vitamin B 12 deficiency     Patient Active Problem List   Diagnosis Date Noted  . Vertigo 10/07/2019  . Acute midline low back pain without sciatica 09/10/2019  . Joint pain 09/01/2019  . Adverse effect of drug 08/15/2019  . Preventative health care 06/23/2019  . Constipation 04/07/2019  . Neck pain 02/17/2019  .  HTN (hypertension), malignant   . TIA (transient ischemic attack)   . 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 Numbness and tingling 02/01/2019  . Urinary frequency 11/30/2018  . Osteoarthritis 06/30/2018  . Vitamin D deficiency 06/23/2018  . Vitamin B 12 deficiency 06/23/2018  . Abdominal pain 06/23/2018  . GERD (gastroesophageal reflux disease) 01/30/2018  . Dyslipidemia 03/19/2017  . Allergic rhinitis 03/19/2017  . Ovarian cyst, left 02/23/2016    Past Surgical History:  Procedure Laterality Date  . CESAREAN SECTION     x 2  . CHOLECYSTECTOMY  2008  . DILATION AND CURETTAGE OF UTERUS     X 2  . LAPAROSCOPIC BILATERAL SALPINGECTOMY Bilateral 07/29/2017   Procedure: LAPAROSCOPIC LEFT SALPINGOOPHRECTOMY: Partial right Salpingectomy;  Surgeon: Lavonia Drafts, MD;  Location: [facility];  Service: Gynecology;  Laterality: Bilateral;  . SHOULDER SURGERY  2016   arthroscopic  . TOTAL ABDOMINAL HYSTERECTOMY  2006   Fibroids, benign pathology; partial hysterectomy per patient   . TUBAL LIGATION    . WISDOM TOOTH EXTRACTION     at age 74 yr  . WRIST ARTHROSCOPY Right 2007    OB History    Gravida  5   Para  3   Term  3   Preterm  0   AB  2   Living  3  SAB  2   TAB  0   Ectopic  0   Multiple  0   Live Births  3            Home Medications    Prior to Admission medications   Medication Sig Start Date End Date Taking? Authorizing Provider  amLODipine (NORVASC) 5 MG tablet Take 1 tablet (5 mg total) by mouth daily. For blood pressure. 04/27/19   Pleas Koch, NP  aspirin 81 MG EC tablet Take by mouth. 09/16/19   [provider]  azelastine (ASTELIN) 0.1 % nasal spray  03/11/19   [provider]  cyclobenzaprine (FLEXERIL) 5 MG tablet Take 1 tablet (5 mg total) by mouth 3 (three) times daily as needed for muscle spasms. 09/10/19   Pleas Koch, NP  fluticasone (FLONASE) 50 MCG/ACT nasal spray Place 1 spray into  both nostrils 2 (two) times daily. 09/01/19   Pleas Koch, NP  meloxicam (MOBIC) 7.5 MG tablet Take 1 tablet (7.5 mg total) by mouth daily. 09/28/19   Jaynee Eagles, PA-C  rosuvastatin (CRESTOR) 5 MG tablet Take 1 tablet (5 mg total) by mouth every evening. For cholesterol. 04/09/19   Pleas Koch, NP  tiZANidine (ZANAFLEX) 4 MG tablet Take 1 tablet (4 mg total) by mouth every 8 (eight) hours as needed. 09/28/19   Jaynee Eagles, PA-C    Family History Family History  Problem Relation Age of Onset  . Hypertension Mother   . Breast cancer Maternal Aunt   . Hypertension Maternal Grandmother   . Heart failure Maternal Grandmother   . Lung cancer Maternal Grandmother   . Other Father        unknown medical history  . Colon cancer Neg Hx   . Colon polyps Neg Hx   . Rectal cancer Neg Hx   . Stomach cancer Neg Hx     Social History Social History   Tobacco Use  . Smoking status: Never Smoker  . Smokeless tobacco: Never Used  Vaping Use  . Vaping Use: Never used  Substance Use Topics  . Alcohol use: No  . Drug use: No     Allergies   Levaquin [levofloxacin]   Review of Systems Review of Systems  Constitutional: Negative for chills and fever.  HENT: Negative for ear pain and sore throat.   Eyes: Negative for pain and visual disturbance.  Respiratory: Negative for cough and shortness of breath.   Cardiovascular: Negative for chest pain and palpitations.  Gastrointestinal: Positive for nausea. Negative for abdominal pain and vomiting.  Genitourinary: Negative for dysuria and hematuria.  Musculoskeletal: Positive for arthralgias. Negative for back pain.  Skin: Negative for color change and rash.  Neurological: Positive for dizziness and numbness. Negative for seizures, syncope, facial asymmetry, speech difficulty and weakness.  All other systems reviewed and are negative.    Physical Exam Triage Vital Signs ED Triage Vitals [10/08/19 1638]  Enc Vitals Group     BP       Pulse      Resp      Temp      Temp src      SpO2      Weight      Height      Head Circumference      Peak Flow      Pain Score 6     Pain Loc      Pain Edu?      Excl. in [place]?  No data found.  Updated Vital Signs BP (!) 153/82   Pulse 91   Temp 98.4 F (36.9 C)   Resp 16   SpO2 97%   Visual Acuity Right Eye Distance:   Left Eye Distance:   Bilateral Distance:    Right Eye Near:   Left Eye Near:    Bilateral Near:     Physical Exam Vitals and nursing note reviewed.  Constitutional:      General: She is not in acute distress.    Appearance: She is well-developed. She is not ill-appearing.  HENT:     Head: Normocephalic and atraumatic.     Mouth/Throat:     Mouth: Mucous membranes are moist.     Pharynx: Oropharynx is clear.  Eyes:     Conjunctiva/sclera: Conjunctivae normal.  Cardiovascular:     Rate and Rhythm: Normal rate and regular rhythm.     Heart sounds: Normal heart sounds. No murmur heard.   Pulmonary:     Effort: Pulmonary effort is normal. No respiratory distress.     Breath sounds: Normal breath sounds. No wheezing or rhonchi.  Abdominal:     Palpations: Abdomen is soft.     Tenderness: There is no abdominal tenderness. There is no guarding or rebound.  Musculoskeletal:        General: Normal range of motion.     Cervical back: Neck supple.     Right lower leg: No edema.     Left lower leg: No edema.  Skin:    General: Skin is warm and dry.     Findings: No rash.  Neurological:     General: No focal deficit present.     Mental Status: She is alert and oriented to person, place, and time.     Sensory: No sensory deficit.     Motor: No weakness.     Coordination: Coordination normal.     Gait: Gait normal.  Psychiatric:        Mood and Affect: Mood normal.        Behavior: Behavior normal.      UC Treatments / Results  Labs (all labs ordered are listed, but only abnormal results are displayed) Labs Reviewed - No data to  display  EKG   Radiology No results found.  Procedures Procedures (including critical care time)  Medications Ordered in UC Medications - No data to display  Initial Impression / Assessment and Plan / UC Course  I have reviewed the triage vital signs and the nursing notes.  Pertinent labs & imaging results that were available during my care of the patient were reviewed by me and considered in my medical decision making (see chart for details).   Dizziness, paresthesias, elevated blood pressure with known hypertension.  EKG shows sinus rhythm, rate 86, no ST elevation, compared to previous from December 2020.  Instructed patient to schedule an appointment with her new PCP in [place] as soon as possible.  Discussed that she should stop taking OTC vitamin B supplements unless she is directed to restart them by her PCP.  Discussed that she should go to the ED if she has acute worsening symptoms.  Discussed that her blood pressure is elevated today needs to be rechecked by her PCP in 2 to 4 weeks.  Patient agrees to plan of care.     Final Clinical Impressions(s) / UC Diagnoses   Final diagnoses:  Dizziness  Paresthesia  Elevated blood pressure reading in office with diagnosis of hypertension  Discharge Instructions     Schedule an appointment with your new primary care provider as soon as possible.    Stop taking over-the-counter vitamin B supplements unless instructed by your primary care provider.    Go to the emergency department if you have acute worsening symptoms.    Your blood pressure is elevated today at 153/82.  Please have this rechecked by your primary care provider in 2-4 weeks.             ED Prescriptions    None     PDMP not reviewed this encounter.   Sharion Balloon, NP 10/08/19 1740

## 2019-10-08 NOTE — Discharge Instructions (Signed)
Schedule an appointment with your new primary care provider as soon as possible.    Stop taking over-the-counter vitamin B supplements unless instructed by your primary care provider.    Go to the emergency department if you have acute worsening symptoms.    Your blood pressure is elevated today at 153/82.  Please have this rechecked by your primary care provider in 2-4 weeks.

## 2019-10-12 NOTE — Telephone Encounter (Signed)
See result note.  

## 2019-10-22 ENCOUNTER — Other Ambulatory Visit: Payer: Self-pay | Admitting: Neurology

## 2019-10-22 DIAGNOSIS — M542 Cervicalgia: Secondary | ICD-10-CM

## 2019-10-22 DIAGNOSIS — M79602 Pain in left arm: Secondary | ICD-10-CM

## 2019-10-22 DIAGNOSIS — R200 Anesthesia of skin: Secondary | ICD-10-CM

## 2019-10-22 DIAGNOSIS — R202 Paresthesia of skin: Secondary | ICD-10-CM

## 2019-11-10 ENCOUNTER — Ambulatory Visit: Payer: 59

## 2019-11-16 NOTE — Telephone Encounter (Signed)
Patient made appointment 9/24 for issues with ear and swallowing. Please triage patient in regards to issues with swallowing. See message below:  "Fullness in my left ear and issues with swallowing"

## 2019-11-17 NOTE — Telephone Encounter (Signed)
Left message to return call to our office.  

## 2019-11-18 NOTE — Telephone Encounter (Signed)
Per appt notes pt already has appt with Dr Glori Bickers on 11/19/19 at 9 AM.

## 2019-11-19 ENCOUNTER — Ambulatory Visit: Admission: RE | Admit: 2019-11-19 | Payer: 59 | Source: Ambulatory Visit

## 2019-11-19 ENCOUNTER — Ambulatory Visit: Payer: 59 | Admitting: Family Medicine

## 2019-11-19 ENCOUNTER — Other Ambulatory Visit: Payer: Self-pay

## 2019-11-19 ENCOUNTER — Other Ambulatory Visit: Payer: Self-pay | Admitting: Neurology

## 2019-11-19 ENCOUNTER — Encounter: Payer: Self-pay | Admitting: Family Medicine

## 2019-11-19 VITALS — BP 148/82 | HR 77 | Temp 96.9°F | Ht 66.0 in | Wt 167.6 lb

## 2019-11-19 DIAGNOSIS — R29898 Other symptoms and signs involving the musculoskeletal system: Secondary | ICD-10-CM

## 2019-11-19 DIAGNOSIS — H9202 Otalgia, left ear: Secondary | ICD-10-CM | POA: Insufficient documentation

## 2019-11-19 DIAGNOSIS — J309 Allergic rhinitis, unspecified: Secondary | ICD-10-CM

## 2019-11-19 DIAGNOSIS — M5412 Radiculopathy, cervical region: Secondary | ICD-10-CM | POA: Insufficient documentation

## 2019-11-19 DIAGNOSIS — M4802 Spinal stenosis, cervical region: Secondary | ICD-10-CM | POA: Insufficient documentation

## 2019-11-19 MED ORDER — AZELASTINE HCL 0.1 % NA SOLN: 2.0000 | Freq: Two times a day (BID) | NASAL | 2 refills | Status: DC

## 2019-11-19 MED ORDER — FLUTICASONE PROPIONATE 50 MCG/ACT NA SUSP: 1.0000 | Freq: Two times a day (BID) | NASAL | 2 refills | Status: DC

## 2019-11-19 NOTE — Patient Instructions (Addendum)
Start back on your nasal sprays  Sinus issues are most likely causing ear pain   Also jaw grinding can play a role (check in with dentist) Also your neck problems can add    Update if not starting to improve in a week or if worsening

## 2019-11-19 NOTE — Progress Notes (Signed)
Subjective:    Patient ID: Ashley [name], female    DOB: [birth year], 54 y.o.   MRN: [id number]  This visit occurred during the SARS-CoV-2 public health emergency.  Safety protocols were in place, including screening questions prior to the visit, additional usage of staff PPE, and extensive cleaning of exam room while observing appropriate contact time as indicated for disinfecting solutions.    HPI 54 yo pt of NP Clark presents for ear problem  Wt Readings from Last 3 Encounters:  11/19/19 167 lb 9 oz (76 kg)  10/07/19 167 lb 8 oz (76 kg)  09/10/19 171 lb (77.6 kg)   27.05 kg/m   Pain for few weeks  Fullness in L ear and face (now starting to ache)  Seldom both ears but usually just on L  Has chronic sinus pnd  ? If sinuses add to it  No colored mucous   No fever  Is occ a little dizzy   Has neck nerve problems- seeing neuro  Herniated disc- going to pain specialist today  That pain can ref to her head also    Out of both flonase and astelin  Needs refills   bp is up today- ran out of bp med and she will pick up later today BP Readings from Last 3 Encounters:  11/19/19 (!) 148/82  10/08/19 (!) 153/82  10/07/19 (!) 146/84    Past hx of tinnitus and vertigo as well  Patient Active Problem List   Diagnosis Date Noted  . Left ear pain 11/19/2019  . Vertigo 10/07/2019  . Acute midline low back pain without sciatica 09/10/2019  . Joint pain 09/01/2019  . Adverse effect of drug 08/15/2019  . Preventative health care 06/23/2019  . Constipation 04/07/2019  . Neck pain 02/17/2019  . HTN (hypertension), malignant   . TIA (transient ischemic attack)   . 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 Numbness and tingling 02/01/2019  . Urinary frequency 11/30/2018  . Osteoarthritis 06/30/2018  . Vitamin D deficiency 06/23/2018  . Vitamin B 12 deficiency 06/23/2018  . Abdominal pain 06/23/2018  . GERD (gastroesophageal reflux disease) 01/30/2018  . Dyslipidemia  03/19/2017  . Allergic rhinitis 03/19/2017  . Ovarian cyst, left 02/23/2016   Past Medical History:  Diagnosis Date  . Anemia   . Bronchitis    2 years ago   . [place] arthritis 2019   base of left thumb   . GERD (gastroesophageal reflux disease)    diet controlled , no meds  . Hydrosalpinx    bilateral   . Hyperlipidemia    recent dx - currently diet control - no meds  . Hypothyroid 2003   radio active iodine - no meds  . Left-sided weakness 02/12/2019  . SVD (spontaneous vaginal delivery)    x 1  . Urinary frequency 11/30/2018  . Vitamin B 12 deficiency    Past Surgical History:  Procedure Laterality Date  . CESAREAN SECTION     x 2  . CHOLECYSTECTOMY  2008  . DILATION AND CURETTAGE OF UTERUS     X 2  . LAPAROSCOPIC BILATERAL SALPINGECTOMY Bilateral 07/29/2017   Procedure: LAPAROSCOPIC LEFT SALPINGOOPHRECTOMY: Partial right Salpingectomy;  Surgeon: Lavonia Drafts, MD;  Location: [facility];  Service: Gynecology;  Laterality: Bilateral;  . SHOULDER SURGERY  2016   arthroscopic  . TOTAL ABDOMINAL HYSTERECTOMY  2006   Fibroids, benign pathology; partial hysterectomy per patient   . TUBAL LIGATION    . WISDOM TOOTH EXTRACTION  at age 52 yr  . WRIST ARTHROSCOPY Right 2007   Social History   Tobacco Use  . Smoking status: Never Smoker  . Smokeless tobacco: Never Used  Vaping Use  . Vaping Use: Never used  Substance Use Topics  . Alcohol use: No  . Drug use: No   Family History  Problem Relation Age of Onset  . Hypertension Mother   . Breast cancer Maternal Aunt   . Hypertension Maternal Grandmother   . Heart failure Maternal Grandmother   . Lung cancer Maternal Grandmother   . Other Father        unknown medical history  . Colon cancer Neg Hx   . Colon polyps Neg Hx   . Rectal cancer Neg Hx   . Stomach cancer Neg Hx    Allergies  Allergen Reactions  . Levaquin [Levofloxacin]     Presumed cause of joint aches   Current  Outpatient Medications on File Prior to Visit  Medication Sig Dispense Refill  . amLODipine (NORVASC) 5 MG tablet Take 1 tablet (5 mg total) by mouth daily. For blood pressure. 90 tablet 2  . aspirin 81 MG EC tablet Take by mouth.    Marland Kitchen tiZANidine (ZANAFLEX) 4 MG tablet Take 1 tablet (4 mg total) by mouth every 8 (eight) hours as needed. (Patient not taking: Reported on 11/19/2019) 30 tablet 0   No current facility-administered medications on file prior to visit.    Review of Systems  Constitutional: Negative for activity change, appetite change, fatigue, fever and unexpected weight change.  HENT: Positive for ear pain and tinnitus. Negative for congestion, ear discharge, facial swelling, hearing loss, rhinorrhea, sinus pressure and sore throat.   Eyes: Negative for pain, redness and visual disturbance.  Respiratory: Negative for cough, shortness of breath and wheezing.   Cardiovascular: Negative for chest pain and palpitations.  Gastrointestinal: Negative for abdominal pain, blood in stool, constipation and diarrhea.  Endocrine: Negative for polydipsia and polyuria.  Genitourinary: Negative for dysuria, frequency and urgency.  Musculoskeletal: Positive for neck pain. Negative for arthralgias, back pain and myalgias.  Skin: Negative for pallor and rash.  Allergic/Immunologic: Negative for environmental allergies.  Neurological: Negative for dizziness, syncope and headaches.  Hematological: Negative for adenopathy. Does not bruise/bleed easily.  Psychiatric/Behavioral: Negative for decreased concentration and dysphoric mood. The patient is not nervous/anxious.        Objective:   Physical Exam Constitutional:      General: She is not in acute distress.    Appearance: Normal appearance. She is normal weight. She is not ill-appearing.  HENT:     Head: Normocephalic and atraumatic.     Comments: No facial or temporal tenderness No TM joint tenderness    Right Ear: Ear canal and external  ear normal. There is no impacted cerumen.     Left Ear: Ear canal and external ear normal. There is no impacted cerumen.     Ears:     Comments: Dull TM -moreso on the L  No erythema or bulging    Nose:     Comments: Boggy nares     Mouth/Throat:     Mouth: Mucous membranes are moist.     Pharynx: Oropharynx is clear.  Eyes:     General: No scleral icterus.       Right eye: No discharge.        Left eye: No discharge.     Conjunctiva/sclera: Conjunctivae normal.     Pupils: Pupils are equal,  round, and reactive to light.  Neck:     Vascular: No carotid bruit.  Cardiovascular:     Rate and Rhythm: Normal rate and regular rhythm.     Pulses: Normal pulses.     Heart sounds: Normal heart sounds.  Pulmonary:     Effort: Pulmonary effort is normal.     Breath sounds: Normal breath sounds.  Musculoskeletal:     Cervical back: Normal range of motion. Tenderness present.  Lymphadenopathy:     Cervical: No cervical adenopathy.  Skin:    General: Skin is warm and dry.     Findings: No erythema or rash.  Neurological:     Mental Status: She is alert.     Sensory: No sensory deficit.     Coordination: Coordination normal.  Psychiatric:        Mood and Affect: Mood normal.           Assessment & Plan:   Problem List Items Addressed This Visit      Respiratory   Allergic rhinitis    This may be adding to ETD Refilled astelin and flonase ns to use in season      Relevant Medications   fluticasone (FLONASE) 50 MCG/ACT nasal spray     Other   Left ear pain - Primary    Suspect multifactorial  ETD from allergies noted - refilled astelin and flonase ns  Pain ref from cervical problems may also play a role (pt will be seeing a pain specialist soon

## 2019-11-20 ENCOUNTER — Ambulatory Visit
Admission: RE | Admit: 2019-11-20 | Discharge: 2019-11-20 | Disposition: A | Discharge status: Home / Self Care | Payer: 59 | Source: Ambulatory Visit | Attending: Neurology | Admitting: Neurology

## 2019-11-20 DIAGNOSIS — R29898 Other symptoms and signs involving the musculoskeletal system: Secondary | ICD-10-CM | POA: Insufficient documentation

## 2019-11-21 NOTE — Assessment & Plan Note (Signed)
This may be adding to ETD Refilled astelin and flonase ns to use in season

## 2019-11-21 NOTE — Assessment & Plan Note (Signed)
Suspect multifactorial  ETD from allergies noted - refilled astelin and flonase ns  Pain ref from cervical problems may also play a role (pt will be seeing a pain specialist soon

## 2019-11-22 ENCOUNTER — Ambulatory Visit: Payer: 59

## 2019-12-09 ENCOUNTER — Other Ambulatory Visit: Payer: Self-pay

## 2019-12-09 ENCOUNTER — Ambulatory Visit: Payer: 59 | Admitting: Family Medicine

## 2019-12-09 ENCOUNTER — Encounter: Payer: Self-pay | Admitting: Family Medicine

## 2019-12-09 VITALS — BP 134/84 | HR 86 | Temp 97.8°F | Ht 66.0 in | Wt 168.1 lb

## 2019-12-09 DIAGNOSIS — G629 Polyneuropathy, unspecified: Secondary | ICD-10-CM

## 2019-12-09 DIAGNOSIS — M48061 Spinal stenosis, lumbar region without neurogenic claudication: Secondary | ICD-10-CM | POA: Insufficient documentation

## 2019-12-09 LAB — BASIC METABOLIC PANEL
BUN: 12 mg/dL (ref 6–23)
CO2: 29 mEq/L (ref 19–32)
Calcium: 10.4 mg/dL (ref 8.4–10.5)
Chloride: 101 mEq/L (ref 96–112)
Creatinine, Ser: 0.75 mg/dL (ref 0.40–1.20)
GFR: 89.86 mL/min (ref >60.00)
Glucose, Bld: 97 mg/dL (ref 70–99)
Potassium: 4.4 mEq/L (ref 3.5–5.1)
Sodium: 139 mEq/L (ref 135–145)

## 2019-12-09 LAB — VITAMIN B12: Vitamin B-12: 609 pg/mL (ref 211–911)

## 2019-12-09 LAB — TSH: TSH: 1.73 u[IU]/mL (ref 0.35–4.50)

## 2019-12-09 NOTE — Progress Notes (Signed)
This visit occurred during the SARS-CoV-2 public health emergency.  Safety protocols were in place, including screening questions prior to the visit, additional usage of staff PPE, and extensive cleaning of exam room while observing appropriate contact time as indicated for disinfecting solutions.  She had seen Dr. Melrose Nakayama in the meantime.  She had neck injection done and that helped some.  Regarding her pain, "It's better."    MRI 10/2019.  IMPRESSION: 1. L1-2 noncompressive disc protrusion. 2. Facet osteoarthritis primarily at L4-5. 3. No neural compression throughout the lumbar spine.  She has burning in the feet and the B lateral thighs.  Similar sx in the hands.  No tingling or numbness but burning noted.  Socks don't feel normal on her feet.    H/o high B12, was off replacement but restarted about 2 weeks ago.    Some ankle swelling prev noted.    Meds, vitals, and allergies reviewed.   ROS: Per HPI unless specifically indicated in ROS section   nad ncat Neck supple, no LA rrr ctab abd soft, not ttp Ext w/o edema Cranial nerves intact bilaterally. Absent sensation on the dorsum on the L hand but normal on the palm side.  Normal sensation on the right hand. Intact vibration x4.   Weaker hip flexion on the L- old finding per patient report.

## 2019-12-09 NOTE — Patient Instructions (Signed)
Don't change your meds for now.  Go to the lab on the way out.   If you have mychart we'll likely use that to update you.    I'll update Clark and we'll await the notes from the outside clinic.  Take care.  Glad to see you.

## 2019-12-13 DIAGNOSIS — G629 Polyneuropathy, unspecified: Secondary | ICD-10-CM | POA: Insufficient documentation

## 2019-12-13 NOTE — Assessment & Plan Note (Signed)
Neuropathy of unclear source.  She wanted to exclude possible causes today.  Discussed differential diagnosis.  See notes on labs.  She has follow-up pending with neurology, with Dr. Melrose Nakayama.  We will await those notes.  I will route to PCP as FYI.  Given that she has no acute new findings we did not change her medications otherwise.  I appreciate the help of all involved.  She agreed with plan.

## 2019-12-29 ENCOUNTER — Encounter: Payer: Self-pay | Admitting: Primary Care

## 2019-12-29 ENCOUNTER — Ambulatory Visit: Payer: Managed Care, Other (non HMO) | Admitting: Primary Care

## 2019-12-29 ENCOUNTER — Other Ambulatory Visit: Payer: Self-pay

## 2019-12-29 VITALS — BP 136/82 | HR 89 | Temp 98.0°F | Ht 66.0 in | Wt 169.0 lb

## 2019-12-29 DIAGNOSIS — R300 Dysuria: Secondary | ICD-10-CM | POA: Diagnosis not present

## 2019-12-29 DIAGNOSIS — N898 Other specified noninflammatory disorders of vagina: Secondary | ICD-10-CM | POA: Insufficient documentation

## 2019-12-29 DIAGNOSIS — G629 Polyneuropathy, unspecified: Secondary | ICD-10-CM | POA: Diagnosis not present

## 2019-12-29 DIAGNOSIS — E538 Deficiency of other specified B group vitamins: Secondary | ICD-10-CM

## 2019-12-29 DIAGNOSIS — M2550 Pain in unspecified joint: Secondary | ICD-10-CM

## 2019-12-29 LAB — POC URINALSYSI DIPSTICK (AUTOMATED)
Bilirubin, UA: NEGATIVE
Blood, UA: POSITIVE
Glucose, UA: NEGATIVE
Ketones, UA: NEGATIVE
Leukocytes, UA: NEGATIVE
Nitrite, UA: NEGATIVE
Protein, UA: NEGATIVE
Spec Grav, UA: 1.02 (ref 1.010–1.025)
Urobilinogen, UA: 0.2 E.U./dL
pH, UA: 6 (ref 5.0–8.0)

## 2019-12-29 NOTE — Patient Instructions (Signed)
I will be in touch once I receive your test results.   For dryness you can use vaginal moisturizers.   Please update me regarding the plan from Dr. Melrose Nakayama.  It was a pleasure to see you today!

## 2019-12-29 NOTE — Assessment & Plan Note (Signed)
Recent B12 level within normal range.

## 2019-12-29 NOTE — Progress Notes (Signed)
Subjective:    Patient ID: Ashley [name], female    DOB: 1965/03/20, 54 y.o.   MRN: [id number]  HPI  This visit occurred during the SARS-CoV-2 public health emergency.  Safety protocols were in place, including screening questions prior to the visit, additional usage of staff PPE, and extensive cleaning of exam room while observing appropriate contact time as indicated for disinfecting solutions.   Ashley [name] is a 54 year old female with a history of TIA, hypertension, osteoarthritis, chronic neck and back pain, neuropathy who presents today with a chief complaint of joint aches. She would also like to discuss lower abdominal pain.  1) Joint Pain: She is currently following with physical medicine through [facility] and with Dr. Melrose Nakayama with Neurology. She was evaluated by Dr. Damita Dunnings two weeks ago for symptoms of burning to the feet and bilateral thighs, also some symptoms in the hands. She endorsed resuming her B12 supplements 2 weeks prior, had been off for a while. Symptoms suspected to be secondary to neuropathy of unclear source, labs completed which showed normal B12, TSH, and BMP. She was advised to follow up with neurology.  She underwent epidural injection to the L5-S1 region and also to the cervical spine per physical medicine on 12/17/19. She's been tested for rheumatoid arthritis by our practice in 2020, labs negative. MRI lumbar spine completed in September 2021 which did not show compressive neuropathy to cause her lower extremity weakness. She was referred for formal physical therapy recently. Her blood pressure was noted to be elevated by her physical medicine provider recently.  Overall she's doing better since both injections, but still has joint pain to ankles, knees, elbows, wrists, shoulder. She continues to notice foot and hand pain, can sometimes feel pain behind her eyes, sharp/intermittent pain to the parietal and occipital head, electric like stinging to the  left chest.   She does take Motrin with improvement. Diclofenac helped, but caused GI upset. She has an appointment with her physical medicine provider later this week.   2) Vaginal burning/Vaginal discharge/Pelvic discomfort: Acute for the last one week since unprotected intercourse one week ago. Has LMP was in 2006 since hysterectomy, never experienced vasomotor symptoms. Vaginal burning with urination intermittently.  She denies vaginal itching and bleeding.   BP Readings from Last 3 Encounters:  12/29/19 136/82  12/09/19 134/84  11/19/19 (!) 148/82     Review of Systems  Musculoskeletal: Positive for arthralgias. Negative for joint swelling.  Skin: Negative for color change.  Neurological: Positive for numbness and headaches.       Numbness to left shoulder       Past Medical History:  Diagnosis Date  . Anemia   . Bronchitis    2 years ago   . [place] arthritis 2019   base of left thumb   . GERD (gastroesophageal reflux disease)    diet controlled , no meds  . Hydrosalpinx    bilateral   . Hyperlipidemia    recent dx - currently diet control - no meds  . Hypothyroid 2003   radio active iodine - no meds  . Left-sided weakness 02/12/2019  . SVD (spontaneous vaginal delivery)    x 1  . Urinary frequency 11/30/2018  . Vitamin B 12 deficiency      Social History   Socioeconomic History  . Marital status: Single    Spouse name: Not on file  . Number of children: Not on file  . Years of education: Not on file  .  Highest education level: Not on file  Occupational History  . Not on file  Tobacco Use  . Smoking status: Never Smoker  . Smokeless tobacco: Never Used  Vaping Use  . Vaping Use: Never used  Substance and Sexual Activity  . Alcohol use: No  . Drug use: No  . Sexual activity: Yes    Partners: Male    Birth control/protection: Surgical    Comment: hysterectomy.  Other Topics Concern  . Not on file  Social History Narrative   Single.   3 children.    Works at [employer] as a Designer, multimedia.   Enjoys relaxing, exercise, walking her dog.    Social Determinants of Health   Financial Resource Strain:   . Difficulty of Paying Living Expenses: Not on file  Food Insecurity:   . Worried About Charity fundraiser in the Last Year: Not on file  . Ran Out of Food in the Last Year: Not on file  Transportation Needs:   . Lack of Transportation (Medical): Not on file  . Lack of Transportation (Non-Medical): Not on file  Physical Activity:   . Days of Exercise per Week: Not on file  . Minutes of Exercise per Session: Not on file  Stress:   . Feeling of Stress : Not on file  Social Connections:   . Frequency of Communication with Friends and Family: Not on file  . Frequency of Social Gatherings with Friends and Family: Not on file  . Attends Religious Services: Not on file  . Active Member of Clubs or Organizations: Not on file  . Attends Archivist Meetings: Not on file  . Marital Status: Not on file  Intimate Partner Violence:   . Fear of Current or Ex-Partner: Not on file  . Emotionally Abused: Not on file  . Physically Abused: Not on file  . Sexually Abused: Not on file    Past Surgical History:  Procedure Laterality Date  . CESAREAN SECTION     x 2  . CHOLECYSTECTOMY  2008  . DILATION AND CURETTAGE OF UTERUS     X 2  . LAPAROSCOPIC BILATERAL SALPINGECTOMY Bilateral 07/29/2017   Procedure: LAPAROSCOPIC LEFT SALPINGOOPHRECTOMY: Partial right Salpingectomy;  Surgeon: Lavonia Drafts, MD;  Location: [place];  Service: Gynecology;  Laterality: Bilateral;  . SHOULDER SURGERY  2016   arthroscopic  . TOTAL ABDOMINAL HYSTERECTOMY  2006   Fibroids, benign pathology; partial hysterectomy per patient   . TUBAL LIGATION    . WISDOM TOOTH EXTRACTION     at age 3 yr  . WRIST ARTHROSCOPY Right 2007    Family History  Problem Relation Age of Onset  . Hypertension Mother   . Breast cancer Maternal Aunt   .  Hypertension Maternal Grandmother   . Heart failure Maternal Grandmother   . Lung cancer Maternal Grandmother   . Other Father        unknown medical history  . Colon cancer Neg Hx   . Colon polyps Neg Hx   . Rectal cancer Neg Hx   . Stomach cancer Neg Hx     Allergies  Allergen Reactions  . Levaquin [Levofloxacin]     Presumed cause of joint aches    Current Outpatient Medications on File Prior to Visit  Medication Sig Dispense Refill  . amLODipine (NORVASC) 5 MG tablet Take 1 tablet (5 mg total) by mouth daily. For blood pressure. 90 tablet 2  . aspirin 81 MG EC tablet Take  by mouth.    Marland Kitchen azelastine (ASTELIN) 0.1 % nasal spray Place 2 sprays into both nostrils 2 (two) times daily. 30 mL 2  . fluticasone (FLONASE) 50 MCG/ACT nasal spray Place 1 spray into both nostrils 2 (two) times daily. 16 g 2  . tiZANidine (ZANAFLEX) 4 MG tablet Take 1 tablet (4 mg total) by mouth every 8 (eight) hours as needed. 30 tablet 0  . vitamin B-12 (CYANOCOBALAMIN) 500 MCG tablet Take 500 mcg by mouth daily.    Marland Kitchen ACZONE 7.5 % GEL SMARTSIG:Sparingly Topical Every Morning    . desonide (DESOWEN) 0.05 % cream desonide 0.05 % topical cream    . ergocalciferol (VITAMIN D2) 1.25 MG (50000 UT) capsule Vitamin D2 1,250 mcg (50,000 unit) capsule     No current facility-administered medications on file prior to visit.    BP 136/82   Pulse 89   Temp 98 F (36.7 C) (Temporal)   Ht 5\' 6"  (1.676 m)   Wt 169 lb (76.7 kg)   SpO2 99%   BMI 27.28 kg/m    Objective:   Physical Exam Genitourinary:    Vagina: Vaginal discharge present. No bleeding.     Cervix: Discharge present.     Adnexa: Right adnexa normal and left adnexa normal.     Comments: Moderate amount of whitish vaginal discharge. No foul smell.  Skin:    General: Skin is warm and dry.  Neurological:     Mental Status: She is alert.            Assessment & Plan:

## 2019-12-29 NOTE — Assessment & Plan Note (Signed)
Chronic and continued, following with physical medicine and neurology.  Consider treatment with gabapentin, duloxetine. She will update.

## 2019-12-29 NOTE — Assessment & Plan Note (Signed)
Acute for the last week after intercourse. Whitish discharge noted on exam today.  Wet prep and STD testing pending.  UA today with trace blood, otherwise negative. Culture sent and pending.

## 2019-12-29 NOTE — Assessment & Plan Note (Signed)
Following with Neurology, will set up an appointment. Recent labs including B12 are normal.  Consider treatment in the future with gabapentin or Cymbalta.   She will update.

## 2019-12-31 ENCOUNTER — Other Ambulatory Visit: Payer: Self-pay | Admitting: Primary Care

## 2019-12-31 DIAGNOSIS — N3001 Acute cystitis with hematuria: Secondary | ICD-10-CM

## 2019-12-31 LAB — WET PREP BY MOLECULAR PROBE
Candida species: NOT DETECTED
MICRO NUMBER:: 11155132
SPECIMEN QUALITY:: ADEQUATE
Trichomonas vaginosis: NOT DETECTED

## 2019-12-31 LAB — URINE CULTURE
MICRO NUMBER:: 11155133
SPECIMEN QUALITY:: ADEQUATE

## 2019-12-31 LAB — C. TRACHOMATIS/N. GONORRHOEAE RNA

## 2019-12-31 MED ORDER — SULFAMETHOXAZOLE-TRIMETHOPRIM 800-160 MG PO TABS: 1.0000 | ORAL_TABLET | Freq: Two times a day (BID) | ORAL | 0 refills | Status: DC

## 2020-01-04 ENCOUNTER — Other Ambulatory Visit: Payer: Managed Care, Other (non HMO)

## 2020-01-04 ENCOUNTER — Other Ambulatory Visit: Payer: Self-pay

## 2020-01-04 DIAGNOSIS — N3001 Acute cystitis with hematuria: Secondary | ICD-10-CM

## 2020-01-04 DIAGNOSIS — N898 Other specified noninflammatory disorders of vagina: Secondary | ICD-10-CM

## 2020-01-04 DIAGNOSIS — R1011 Right upper quadrant pain: Secondary | ICD-10-CM

## 2020-01-04 DIAGNOSIS — Z113 Encounter for screening for infections with a predominantly sexual mode of transmission: Secondary | ICD-10-CM

## 2020-01-04 NOTE — Addendum Note (Signed)
Addended by: Francella Solian on: 01/04/2020 08:56 AM   Modules accepted: Orders

## 2020-01-05 LAB — C. TRACHOMATIS/N. GONORRHOEAE RNA
C. trachomatis RNA, TMA: NOT DETECTED
N. gonorrhoeae RNA, TMA: NOT DETECTED

## 2020-01-06 DIAGNOSIS — T3695XA Adverse effect of unspecified systemic antibiotic, initial encounter: Secondary | ICD-10-CM

## 2020-01-06 DIAGNOSIS — B379 Candidiasis, unspecified: Secondary | ICD-10-CM

## 2020-01-07 MED ORDER — FLUCONAZOLE 150 MG PO TABS: 150.0000 mg | ORAL_TABLET | Freq: Once | ORAL | 0 refills | Status: AC

## 2020-02-02 ENCOUNTER — Telehealth: Payer: Self-pay

## 2020-02-02 NOTE — Telephone Encounter (Signed)
Patient called back and stated that she has had this abdominal pain and cramping off and on for about a week. Patient stated that it has been more constant the past week. Patient stated that she feels that it is her acid reflux because she is belching a lot. Patient denies, fever, vomiting or diarrhea. Patient stated the  job that she works at now requires that she be tested for covid twice a week and the results have been negative. Patient stated that her chest hurts at time but not her heart. Patient stated that the  discomfort is more in her esophagus area. Patient was advised to try to eat a bland diet and not eat late at night. Patient was given ER precautions and verbalized understanding. Patient is scheduled to see Allie Bossier NP 02/07/20. Patient was advised that message with go to [facility] for her to review.

## 2020-02-02 NOTE — Telephone Encounter (Signed)
Ashley [name] front office mgr asked me to contact pt due to my chart in office visit scheduled by pt on 02/07/20. Pt had put in my chart note for couple of weeks having abd pain and cramping and at times feels like pt is going to vomit but does not. I was unable to speak with pt;left v/m requesting pt to cb.

## 2020-02-02 NOTE — Telephone Encounter (Signed)
Tried calling pt again but no answer.

## 2020-02-03 NOTE — Telephone Encounter (Signed)
Left message to return call to our office.  

## 2020-02-03 NOTE — Telephone Encounter (Signed)
Noted. If she believes her symptoms are from esophageal reflux, she can try some generic Pepcid (famotidine) 20 mg once or twice daily until she is seen.  She can get this over-the-counter at any drugstore.

## 2020-02-07 ENCOUNTER — Encounter: Payer: Self-pay | Admitting: Primary Care

## 2020-02-07 ENCOUNTER — Other Ambulatory Visit: Payer: Self-pay

## 2020-02-07 ENCOUNTER — Ambulatory Visit: Payer: Managed Care, Other (non HMO) | Admitting: Primary Care

## 2020-02-07 VITALS — BP 124/62 | HR 98 | Temp 97.6°F | Ht 66.0 in | Wt 169.0 lb

## 2020-02-07 DIAGNOSIS — I10 Essential (primary) hypertension: Secondary | ICD-10-CM

## 2020-02-07 DIAGNOSIS — R1084 Generalized abdominal pain: Secondary | ICD-10-CM

## 2020-02-07 DIAGNOSIS — K219 Gastro-esophageal reflux disease without esophagitis: Secondary | ICD-10-CM | POA: Diagnosis not present

## 2020-02-07 DIAGNOSIS — G629 Polyneuropathy, unspecified: Secondary | ICD-10-CM

## 2020-02-07 DIAGNOSIS — R350 Frequency of micturition: Secondary | ICD-10-CM | POA: Diagnosis not present

## 2020-02-07 LAB — POC URINALSYSI DIPSTICK (AUTOMATED)
Bilirubin, UA: NEGATIVE
Blood, UA: NEGATIVE
Glucose, UA: NEGATIVE
Ketones, UA: NEGATIVE
Leukocytes, UA: NEGATIVE
Nitrite, UA: NEGATIVE
Protein, UA: NEGATIVE
Spec Grav, UA: 1.02 (ref 1.010–1.025)
Urobilinogen, UA: 2 E.U./dL — AB
pH, UA: 6 (ref 5.0–8.0)

## 2020-02-07 MED ORDER — OMEPRAZOLE 40 MG PO CPDR: 40.0000 mg | DELAYED_RELEASE_CAPSULE | Freq: Every day | ORAL | 0 refills | Status: DC

## 2020-02-07 MED ORDER — AMLODIPINE BESYLATE 5 MG PO TABS: 5.0000 mg | ORAL_TABLET | Freq: Every day | ORAL | 1 refills | Status: DC

## 2020-02-07 NOTE — Patient Instructions (Signed)
Start taking omeprazole 40 mg daily for heartburn. Stop taking Pepcid for now.  You will be contacted regarding your referral to GI.  Please let us know if you have not been contacted within two weeks.   It was a pleasure to see you today!   Food Choices for Gastroesophageal Reflux Disease, Adult When you have gastroesophageal reflux disease (GERD), the foods you eat and your eating habits are very important. Choosing the right foods can help ease your discomfort. Think about working with a nutrition specialist (dietitian) to help you make good choices. What are tips for following this plan?  Meals  Choose healthy foods that are low in fat, such as fruits, vegetables, whole grains, low-fat dairy products, and lean meat, fish, and poultry.  Eat small meals often instead of 3 large meals a day. Eat your meals slowly, and in a place where you are relaxed. Avoid bending over or lying down until 2-3 hours after eating.  Avoid eating meals 2-3 hours before bed.  Avoid drinking a lot of liquid with meals.  Cook foods using methods other than frying. Bake, grill, or broil food instead.  Avoid or limit: ? Chocolate. ? Peppermint or spearmint. ? Alcohol. ? Pepper. ? Black and decaffeinated coffee. ? Black and decaffeinated tea. ? Bubbly (carbonated) soft drinks. ? Caffeinated energy drinks and soft drinks.  Limit high-fat foods such as: ? Fatty meat or fried foods. ? Whole milk, cream, butter, or ice cream. ? Nuts and nut butters. ? Pastries, donuts, and sweets made with butter or shortening.  Avoid foods that cause symptoms. These foods may be different for everyone. Common foods that cause symptoms include: ? Tomatoes. ? Oranges, lemons, and limes. ? Peppers. ? Spicy food. ? Onions and garlic. ? Vinegar. Lifestyle  Maintain a healthy weight. Ask your doctor what weight is healthy for you. If you need to lose weight, work with your doctor to do so safely.  Exercise for at  least 30 minutes for 5 or more days each week, or as told by your doctor.  Wear loose-fitting clothes.  Do not smoke. If you need help quitting, ask your doctor.  Sleep with the head of your bed higher than your feet. Use a wedge under the mattress or blocks under the bed frame to raise the head of the bed. Summary  When you have gastroesophageal reflux disease (GERD), food and lifestyle choices are very important in easing your symptoms.  Eat small meals often instead of 3 large meals a day. Eat your meals slowly, and in a place where you are relaxed.  Limit high-fat foods such as fatty meat or fried foods.  Avoid bending over or lying down until 2-3 hours after eating.  Avoid peppermint and spearmint, caffeine, alcohol, and chocolate. This information is not intended to replace advice given to you by your health care provider. Make sure you discuss any questions you have with your health care provider. Document Revised: 06/04/2018 Document Reviewed: 03/19/2016 Elsevier Patient Education  [place].

## 2020-02-07 NOTE — Telephone Encounter (Signed)
Patient seen in office today. 

## 2020-02-07 NOTE — Assessment & Plan Note (Signed)
Improved with gabapentin 300 mg HS, continue same.

## 2020-02-07 NOTE — Assessment & Plan Note (Signed)
Ongoing, no resolve with famotidine and Tums. Rx for omeprazole 40 mg sent to pharmacy.  Referral placed to GI as requested. She may ultimately need endoscopy.

## 2020-02-07 NOTE — Assessment & Plan Note (Signed)
Chronic, ongoing, generalized. Doesn't appear sickly, no suspicion for diverticulitis.  Referral placed to GI. Will treat GERD.

## 2020-02-07 NOTE — Progress Notes (Signed)
Subjective:    Patient ID: Ashley [name], female    DOB: 03-Nov-1965, 54 y.o.   MRN: [id number]  HPI  This visit occurred during the SARS-CoV-2 public health emergency.  Safety protocols were in place, including screening questions prior to the visit, additional usage of staff PPE, and extensive cleaning of exam room while observing appropriate contact time as indicated for disinfecting solutions.   Ms. Kotas is a 54 year old female with a history of TIA, hypertension, GERD, neuropathy, abdominal pain who presents today with a chief complaint of abdominal cramping and GERD. She's also noticed urinary frequency.   Symptoms include throat fullness, belching, nausea, esophageal burning that occur daily, worse in the morning. She's been taking Pepcid Complete without improvement. She will use Tums in between Pepcid which helps, but continues to have mild symptoms. She was once managed on pantoprazole 20 mg but she endorses that this wasn't very effective.   Her abdominal cramping is located to the generalized abdomen which moves around, this began about one month ago. Over the last two weeks symptoms have been more consistent. She has daily bowel movements, mostly soft and solid. She denies rectal bleeding, unexplained weight loss. Cramping is improved with laying down, worse when sitting up. She's been working to change her diet, limits fried/fast food, limiting acidic foods. Her abdominal pain is no better but also no worse.  She has noticed urinary frequency for the last 2 weeks, also darker color to urine. She began to increase intake of water which has improved the dark color of her urine, but she continues to notice frequency. Denies dysuria, vaginal itching/discharge.   She is requesting a referral to [facility]. Last colonoscopy was in 2019 which revealed left colon diverticulosis, non cancerous polyp. Recall due in 2029.   Review of Systems  Constitutional: Negative for  fever.  Gastrointestinal: Positive for abdominal pain and nausea. Negative for blood in stool, constipation, diarrhea and vomiting.       GERD  Genitourinary: Positive for frequency. Negative for dysuria, hematuria and vaginal discharge.       Past Medical History:  Diagnosis Date   Anemia    Bronchitis    2 years ago    [place] arthritis 2019   base of left thumb    GERD (gastroesophageal reflux disease)    diet controlled , no meds   Hydrosalpinx    bilateral    Hyperlipidemia    recent dx - currently diet control - no meds   Hypothyroid 2003   radio active iodine - no meds   Left-sided weakness 02/12/2019   SVD (spontaneous vaginal delivery)    x 1   Urinary frequency 11/30/2018   Vitamin B 12 deficiency      Social History   Socioeconomic History   Marital status: Single    Spouse name: Not on file   Number of children: Not on file   Years of education: Not on file   Highest education level: Not on file  Occupational History   Not on file  Tobacco Use   Smoking status: Never Smoker   Smokeless tobacco: Never Used  Vaping Use   Vaping Use: Never used  Substance and Sexual Activity   Alcohol use: No   Drug use: No   Sexual activity: Yes    Partners: Male    Birth control/protection: Surgical    Comment: hysterectomy.  Other Topics Concern   Not on file  Social History Narrative  Single.   3 children.   Works at [employer] as a Designer, multimedia.   Enjoys relaxing, exercise, walking her dog.    Social Determinants of Health   Financial Resource Strain: Not on file  Food Insecurity: Not on file  Transportation Needs: Not on file  Physical Activity: Not on file  Stress: Not on file  Social Connections: Not on file  Intimate Partner Violence: Not on file    Past Surgical History:  Procedure Laterality Date   CESAREAN SECTION     x 2   CHOLECYSTECTOMY  2008   DILATION AND CURETTAGE OF UTERUS     X 2   LAPAROSCOPIC BILATERAL SALPINGECTOMY  Bilateral 07/29/2017   Procedure: LAPAROSCOPIC LEFT SALPINGOOPHRECTOMY: Partial right Salpingectomy;  Surgeon: Lavonia Drafts, MD;  Location: [place];  Service: Gynecology;  Laterality: Bilateral;   SHOULDER SURGERY  2016   arthroscopic   TOTAL ABDOMINAL HYSTERECTOMY  2006   Fibroids, benign pathology; partial hysterectomy per patient    TUBAL LIGATION     WISDOM TOOTH EXTRACTION     at age 40 yr   WRIST ARTHROSCOPY Right 2007    Family History  Problem Relation Age of Onset   Hypertension Mother    Breast cancer Maternal Aunt    Hypertension Maternal Grandmother    Heart failure Maternal Grandmother    Lung cancer Maternal Grandmother    Other Father        unknown medical history   Colon cancer Neg Hx    Colon polyps Neg Hx    Rectal cancer Neg Hx    Stomach cancer Neg Hx     Allergies  Allergen Reactions   Levaquin [Levofloxacin]     Presumed cause of joint aches    Current Outpatient Medications on File Prior to Visit  Medication Sig Dispense Refill   ACZONE 7.5 % GEL SMARTSIG:Sparingly Topical Every Morning     aspirin 81 MG EC tablet Take by mouth.     azelastine (ASTELIN) 0.1 % nasal spray Place 2 sprays into both nostrils 2 (two) times daily. 30 mL 2   fluticasone (FLONASE) 50 MCG/ACT nasal spray Place 1 spray into both nostrils 2 (two) times daily. 16 g 2   gabapentin (NEURONTIN) 300 MG capsule Take 300 mg by mouth daily.     No current facility-administered medications on file prior to visit.    BP 124/62    Pulse 98    Temp 97.6 F (36.4 C) (Temporal)    Ht 5\' 6"  (1.676 m)    Wt 169 lb (76.7 kg)    SpO2 98%    BMI 27.28 kg/m    Objective:   Physical Exam Constitutional:      Appearance: She is well-nourished.  Cardiovascular:     Rate and Rhythm: Normal rate and regular rhythm.  Pulmonary:     Effort: Pulmonary effort is normal.     Breath sounds: Normal breath sounds.  Abdominal:     General: Abdomen  is flat.     Palpations: Abdomen is soft.     Tenderness: There is generalized abdominal tenderness. There is no right CVA tenderness or left CVA tenderness.  Musculoskeletal:     Cervical back: Neck supple.  Skin:    General: Skin is warm and dry.  Psychiatric:        Mood and Affect: Mood and affect normal.            Assessment & Plan:

## 2020-03-11 ENCOUNTER — Ambulatory Visit
Admission: EM | Admit: 2020-03-11 | Discharge: 2020-03-11 | Disposition: A | Discharge status: Home / Self Care | Payer: Managed Care, Other (non HMO) | Attending: Family Medicine | Admitting: Family Medicine

## 2020-03-11 ENCOUNTER — Encounter: Payer: Self-pay | Admitting: Emergency Medicine

## 2020-03-11 ENCOUNTER — Other Ambulatory Visit: Payer: Self-pay

## 2020-03-11 DIAGNOSIS — R1031 Right lower quadrant pain: Secondary | ICD-10-CM

## 2020-03-11 LAB — URINALYSIS, COMPLETE (UACMP) WITH MICROSCOPIC
Bilirubin Urine: NEGATIVE
Glucose, UA: NEGATIVE mg/dL
Ketones, ur: NEGATIVE mg/dL
Leukocytes,Ua: NEGATIVE
Nitrite: NEGATIVE
Protein, ur: NEGATIVE mg/dL
Specific Gravity, Urine: 1.02 (ref 1.005–1.030)
pH: 6.5 (ref 5.0–8.0)

## 2020-03-11 NOTE — ED Provider Notes (Signed)
[facility]    CSN: [id number] Arrival date & time: 03/11/20  1058      History   Chief Complaint Chief Complaint  Patient presents with  . Abdominal Pain    RLQ    HPI Heavenly Poppy is a 55 y.o. female.   HPI   55 year old female here for evaluation of right lower quadrant pain and urinary urgency for the last 3 days.  Patient reports that her right lower quadrant pain increased in intensity yesterday.  She is concerned that she might have a UTI because she is supposed to be going out of town for 2 weeks tomorrow and she wanted to get her urinalysis checked.  Patient denies pain with urination, blood in her urine, cloudiness to her urine, nausea, vomiting, or fever.  Patient has had a hysterectomy and bilateral salpingectomy with a unilateral oophorectomy but she is not sure which side was removed.  Past Medical History:  Diagnosis Date  . Anemia   . Bronchitis    2 years ago   . [place] arthritis 2019   base of left thumb   . GERD (gastroesophageal reflux disease)    diet controlled , no meds  . Hydrosalpinx    bilateral   . Hyperlipidemia    recent dx - currently diet control - no meds  . Hypothyroid 2003   radio active iodine - no meds  . Left-sided weakness 02/12/2019  . SVD (spontaneous vaginal delivery)    x 1  . Urinary frequency 11/30/2018  . Vitamin B 12 deficiency     Patient Active Problem List   Diagnosis Date Noted  . Vaginal discharge 12/29/2019  . Neuropathy 12/13/2019  . Left ear pain 11/19/2019  . Vertigo 10/07/2019  . Acute midline low back pain without sciatica 09/10/2019  . Joint pain 09/01/2019  . Adverse effect of drug 08/15/2019  . Preventative health care 06/23/2019  . Constipation 04/07/2019  . Neck pain 02/17/2019  . HTN (hypertension), malignant   . TIA (transient ischemic attack)   . 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 Numbness and tingling 02/01/2019  . Urinary frequency 11/30/2018  . Osteoarthritis  06/30/2018  . Vitamin D deficiency 06/23/2018  . Vitamin B 12 deficiency 06/23/2018  . Abdominal pain 06/23/2018  . GERD (gastroesophageal reflux disease) 01/30/2018  . Dyslipidemia 03/19/2017  . Allergic rhinitis 03/19/2017  . Ovarian cyst, left 02/23/2016    Past Surgical History:  Procedure Laterality Date  . CESAREAN SECTION     x 2  . CHOLECYSTECTOMY  2008  . DILATION AND CURETTAGE OF UTERUS     X 2  . LAPAROSCOPIC BILATERAL SALPINGECTOMY Bilateral 07/29/2017   Procedure: LAPAROSCOPIC LEFT SALPINGOOPHRECTOMY: Partial right Salpingectomy;  Surgeon: Lavonia Drafts, MD;  Location: [place];  Service: Gynecology;  Laterality: Bilateral;  . SHOULDER SURGERY  2016   arthroscopic  . TOTAL ABDOMINAL HYSTERECTOMY  2006   Fibroids, benign pathology; partial hysterectomy per patient   . TUBAL LIGATION    . WISDOM TOOTH EXTRACTION     at age 30 yr  . WRIST ARTHROSCOPY Right 2007    OB History    Gravida  5   Para  3   Term  3   Preterm  0   AB  2   Living  3     SAB  2   IAB  0   Ectopic  0   Multiple  0   Live Births  3  Home Medications    Prior to Admission medications   Medication Sig Start Date End Date Taking? Authorizing Provider  amLODipine (NORVASC) 5 MG tablet Take 1 tablet (5 mg total) by mouth daily. For blood pressure. 02/07/20  Yes Pleas Koch, NP  aspirin 81 MG EC tablet Take by mouth. 09/16/19  Yes [provider]  fluticasone (FLONASE) 50 MCG/ACT nasal spray Place 1 spray into both nostrils 2 (two) times daily. 11/19/19  Yes Tower, Wynelle Fanny, MD  gabapentin (NEURONTIN) 300 MG capsule Take 300 mg by mouth daily. 01/25/20  Yes [provider]  omeprazole (PRILOSEC) 40 MG capsule Take 1 capsule (40 mg total) by mouth daily. For heartburn. 02/07/20  Yes Pleas Koch, NP  ACZONE 7.5 % GEL SMARTSIG:Sparingly Topical Every Morning 10/05/19   [provider]  azelastine  (ASTELIN) 0.1 % nasal spray Place 2 sprays into both nostrils 2 (two) times daily. 11/19/19   Tower, Wynelle Fanny, MD    Family History Family History  Problem Relation Age of Onset  . Hypertension Mother   . Breast cancer Maternal Aunt   . Hypertension Maternal Grandmother   . Heart failure Maternal Grandmother   . Lung cancer Maternal Grandmother   . Other Father        unknown medical history  . Colon cancer Neg Hx   . Colon polyps Neg Hx   . Rectal cancer Neg Hx   . Stomach cancer Neg Hx     Social History Social History   Tobacco Use  . Smoking status: Never Smoker  . Smokeless tobacco: Never Used  Vaping Use  . Vaping Use: Never used  Substance Use Topics  . Alcohol use: No  . Drug use: No     Allergies   Levaquin [levofloxacin]   Review of Systems Review of Systems  Constitutional: Negative for activity change and fever.  Gastrointestinal: Negative for diarrhea, nausea and vomiting.  Genitourinary: Positive for urgency. Negative for dysuria and hematuria.  Musculoskeletal: Negative for back pain.  Skin: Negative for rash.  Hematological: Negative.   Psychiatric/Behavioral: Negative.      Physical Exam Triage Vital Signs ED Triage Vitals  Enc Vitals Group     BP 03/11/20 1118 (!) 150/92     Pulse Rate 03/11/20 1118 83     Resp 03/11/20 1118 14     Temp 03/11/20 1118 98.4 F (36.9 C)     Temp Source 03/11/20 1118 Oral     SpO2 03/11/20 1118 100 %     Weight 03/11/20 1116 169 lb (76.7 kg)     Height 03/11/20 1116 5\' 6"  (1.676 m)     Head Circumference --      Peak Flow --      Pain Score 03/11/20 1115 5     Pain Loc --      Pain Edu? --      Excl. in [place]? --    No data found.  Updated Vital Signs BP (!) 150/92 (BP Location: Left Arm)   Pulse 83   Temp 98.4 F (36.9 C) (Oral)   Resp 14   Ht 5\' 6"  (1.676 m)   Wt 169 lb (76.7 kg)   SpO2 100%   BMI 27.28 kg/m   Visual Acuity Right Eye Distance:   Left Eye Distance:   Bilateral Distance:     Right Eye Near:   Left Eye Near:    Bilateral Near:     Physical Exam Vitals and  nursing note reviewed.  Constitutional:      General: She is not in acute distress.    Appearance: She is well-developed and normal weight. She is not toxic-appearing.  HENT:     Head: Normocephalic and atraumatic.  Cardiovascular:     Rate and Rhythm: Normal rate and regular rhythm.     Heart sounds: Normal heart sounds. No murmur heard. No gallop.   Pulmonary:     Effort: Pulmonary effort is normal.     Breath sounds: Normal breath sounds. No wheezing, rhonchi or rales.  Abdominal:     General: Abdomen is flat. Bowel sounds are normal. There is no distension.     Palpations: Abdomen is soft. There is no hepatomegaly or splenomegaly.     Tenderness: There is abdominal tenderness in the right lower quadrant. There is no right CVA tenderness or left CVA tenderness. Positive signs include McBurney's sign. Negative signs include obturator sign.     Hernia: No hernia is present.  Skin:    General: Skin is warm and dry.     Capillary Refill: Capillary refill takes less than 2 seconds.     Findings: No erythema.  Neurological:     General: No focal deficit present.     Mental Status: She is alert and oriented to person, place, and time.  Psychiatric:        Mood and Affect: Mood normal.        Behavior: Behavior normal.      UC Treatments / Results  Labs (all labs ordered are listed, but only abnormal results are displayed) Labs Reviewed  URINALYSIS, COMPLETE (UACMP) WITH MICROSCOPIC - Abnormal; Notable for the following components:      Result Value   APPearance HAZY (*)    Hgb urine dipstick TRACE (*)    Bacteria, UA RARE (*)    All other components within normal limits    EKG   Radiology No results found.  Procedures Procedures (including critical care time)  Medications Ordered in UC Medications - No data to display  Initial Impression / Assessment and Plan / UC Course  I  have reviewed the triage vital signs and the nursing notes.  Pertinent labs & imaging results that were available during my care of the patient were reviewed by me and considered in my medical decision making (see chart for details).   Very pleasant 55 year old female presents for evaluation of right lower quadrant pain that is been going on for 3 days and intensified yesterday.  She has had no associated fever, anorexia, nausea, or vomiting.  Patient does repeat some urinary urgency without frequency or dysuria.  No hematuria as well.  Patient is relatively nontoxic appearing.  Lung sounds are clear in all fields.  Abdomen is soft, nondistended with positive bowel sounds in all 4 quadrants.  Patient does have tenderness in the right lower quadrant with tenderness over McBurney's point.  No rebound tenderness.  No pain with heel strike.  Patient is concerned that she could have a UTI or this could be a side effect of a lower back injection she received yesterday.  Patient has no radiation of pain into her leg.  Patient has a negative obturator sign.  Psoas sign not checked.  Patient advised that she needs a CT scan to rule out appendicitis and we do not have the ability to do a CT scan here today so patient will be referred to the emergency department.  Patient has elected to go to  [facility].  Urinalysis collected at triage that shows trace hemoglobin and rare bacteria but is otherwise unremarkable.   Final Clinical Impressions(s) / UC Diagnoses   Final diagnoses:  Right lower quadrant abdominal pain     Discharge Instructions     Please go to [facility] emergency department for evaluation of possible appendicitis as we discussed.    ED Prescriptions    None     PDMP not reviewed this encounter.   Margarette Canada, NP 03/11/20 1153

## 2020-03-11 NOTE — ED Notes (Signed)
Patient is being discharged from the Urgent Care and sent to the [facility] Emergency Department via private vehicle . Per Margarette Canada, NP, patient is in need of higher level of care due to symptoms. Patient is aware and verbalizes understanding of plan of care.  Vitals:   03/11/20 1118  BP: (!) 150/92  Pulse: 83  Resp: 14  Temp: 98.4 F (36.9 C)  SpO2: 100%

## 2020-03-11 NOTE — Discharge Instructions (Addendum)
Please go to [facility] emergency department for evaluation of possible appendicitis as we discussed.

## 2020-03-11 NOTE — ED Triage Notes (Signed)
Patient c/o RLQ abdominal pain for the past 3 days and has gotten worse since yesterday. Patient denies fevers.  Patient reports urinary urgency.  Patient states that she did get a steriod injection yesterday.

## 2020-03-27 ENCOUNTER — Other Ambulatory Visit (HOSPITAL_COMMUNITY)
Admission: RE | Admit: 2020-03-27 | Discharge: 2020-03-27 | Disposition: A | Discharge status: Home / Self Care | Payer: Managed Care, Other (non HMO) | Source: Ambulatory Visit | Attending: Obstetrics & Gynecology | Admitting: Obstetrics & Gynecology

## 2020-03-27 ENCOUNTER — Ambulatory Visit (INDEPENDENT_AMBULATORY_CARE_PROVIDER_SITE_OTHER): Payer: Managed Care, Other (non HMO) | Admitting: Obstetrics & Gynecology

## 2020-03-27 ENCOUNTER — Other Ambulatory Visit: Payer: Self-pay

## 2020-03-27 ENCOUNTER — Encounter: Payer: Self-pay | Admitting: Obstetrics & Gynecology

## 2020-03-27 VITALS — BP 139/86 | HR 105 | Ht 66.0 in | Wt 175.0 lb

## 2020-03-27 DIAGNOSIS — N898 Other specified noninflammatory disorders of vagina: Secondary | ICD-10-CM | POA: Insufficient documentation

## 2020-03-27 DIAGNOSIS — R350 Frequency of micturition: Secondary | ICD-10-CM

## 2020-03-27 LAB — POCT URINALYSIS DIPSTICK
Glucose, UA: NEGATIVE
Leukocytes, UA: NEGATIVE
Nitrite, UA: NEGATIVE
Protein, UA: NEGATIVE
Spec Grav, UA: 1.02 (ref 1.010–1.025)
Urobilinogen, UA: 0.2 E.U./dL
pH, UA: 7 (ref 5.0–8.0)

## 2020-03-27 NOTE — Progress Notes (Signed)
GYNECOLOGY OFFICE VISIT NOTE  History:   Ashley [name] is a 55 y.o. [id number] here today for evaluation of vaginal discharge. It is resolving, occurred at the same time as her recent HSV outbreak and seems to be resolving now that outbreak is gone.  Also wants urine evaluated for UTI, reports history of recurrent UTIs, no current symptoms. She denies any abnormal vaginal discharge, bleeding, pelvic pain or other concerns.    Past Medical History:  Diagnosis Date  . Anemia   . Bronchitis    2 years ago   . [place] arthritis 2019   base of left thumb   . GERD (gastroesophageal reflux disease)    diet controlled , no meds  . Hydrosalpinx    bilateral   . Hyperlipidemia    recent dx - currently diet control - no meds  . Hypothyroid 2003   radio active iodine - no meds  . Left-sided weakness 02/12/2019  . SVD (spontaneous vaginal delivery)    x 1  . Urinary frequency 11/30/2018  . Vitamin B 12 deficiency     Past Surgical History:  Procedure Laterality Date  . CESAREAN SECTION     x 2  . CHOLECYSTECTOMY  2008  . DILATION AND CURETTAGE OF UTERUS     X 2  . LAPAROSCOPIC BILATERAL SALPINGECTOMY Bilateral 07/29/2017   Procedure: LAPAROSCOPIC LEFT SALPINGOOPHRECTOMY: Partial right Salpingectomy;  Surgeon: Lavonia Drafts, MD;  Location: [place];  Service: Gynecology;  Laterality: Bilateral;  . SHOULDER SURGERY  2016   arthroscopic  . TOTAL ABDOMINAL HYSTERECTOMY  2006   Fibroids, benign pathology; partial hysterectomy per patient   . TUBAL LIGATION    . WISDOM TOOTH EXTRACTION     at age 18 yr  . WRIST ARTHROSCOPY Right 2007    The following portions of the patient's history were reviewed and updated as appropriate: allergies, current medications, past family history, past medical history, past social history, past surgical history and problem list.    Review of Systems:  Pertinent items noted in HPI and remainder of comprehensive ROS otherwise  negative.  Physical Exam:  BP 139/86   Pulse (!) 105   Ht 5\' 6"  (1.676 m)   Wt 175 lb (79.4 kg)   BMI 28.25 kg/m  CONSTITUTIONAL: Well-developed, well-nourished female in no acute distress.  MUSCULOSKELETAL: Normal range of motion. No edema noted. NEUROLOGIC: Alert and oriented to person, place, and time. Normal muscle tone coordination. No cranial nerve deficit noted. PSYCHIATRIC: Normal mood and affect. Normal behavior. Normal judgment and thought content. CARDIOVASCULAR: Normal heart rate noted RESPIRATORY: Effort and breath sounds normal, no problems with respiration noted ABDOMEN: No masses noted. No other overt distention noted.   PELVIC: Normal appearing external genitalia; normal urethral meatus; normal appearing distal vaginal mucosa.  Clear discharge noted, testing sample obtained.  Performed in the presence of a chaperone  Labs and Imaging Results for orders placed or performed in visit on 03/27/20 (from the past 168 hour(s))  POCT Urinalysis Dipstick   Collection Time: 03/27/20  2:01 PM  Result Value Ref Range   Color, UA     Clarity, UA     Glucose, UA Negative Negative   Bilirubin, UA     Ketones, UA     Spec Grav, UA 1.020 1.010 - 1.025   Blood, UA None    pH, UA 7.0 5.0 - 8.0   Protein, UA Negative Negative   Urobilinogen, UA 0.2 0.2 or 1.0 E.U./dL  Nitrite, UA Negative    Leukocytes, UA Negative Negative   Appearance     Odor        Assessment and Plan:      1. Vaginal discharge - Cervicovaginal ancillary only done, will follow up results and manage accordingly.   2. Urinary frequency - POCT Urinalysis Dipstick negative, patient informed Routine preventative health maintenance measures emphasized. Please refer to After Visit Summary for other counseling recommendations.   Return for any gynecologic concerns.    I spent 10 minutes dedicated to the care of this patient including pre-visit review of records, face to face time with the patient  discussing her conditions and treatments and post visit ordering of testing.    Verita Schneiders, MD, [facility] [employer], [place]

## 2020-03-28 LAB — CERVICOVAGINAL ANCILLARY ONLY
Bacterial Vaginitis (gardnerella): POSITIVE — AB
Candida Glabrata: NEGATIVE
Candida Vaginitis: NEGATIVE
Chlamydia: NEGATIVE
Comment: NEGATIVE
Comment: NEGATIVE
Comment: NEGATIVE
Comment: NEGATIVE
Comment: NEGATIVE
Comment: NORMAL
Neisseria Gonorrhea: NEGATIVE
Trichomonas: NEGATIVE

## 2020-03-28 MED ORDER — METRONIDAZOLE 500 MG PO TABS: 500.0000 mg | ORAL_TABLET | Freq: Two times a day (BID) | ORAL | 0 refills | Status: AC

## 2020-03-28 NOTE — Addendum Note (Signed)
Addended by: Verita Schneiders A on: 03/28/2020 01:40 PM   Modules accepted: Orders

## 2020-05-10 ENCOUNTER — Ambulatory Visit (INDEPENDENT_AMBULATORY_CARE_PROVIDER_SITE_OTHER)
Admission: RE | Admit: 2020-05-10 | Discharge: 2020-05-10 | Disposition: A | Discharge status: Home / Self Care | Payer: Managed Care, Other (non HMO) | Source: Ambulatory Visit | Attending: Primary Care | Admitting: Primary Care

## 2020-05-10 ENCOUNTER — Ambulatory Visit
Admission: RE | Admit: 2020-05-10 | Discharge: 2020-05-10 | Disposition: A | Discharge status: Home / Self Care | Payer: Managed Care, Other (non HMO) | Source: Ambulatory Visit | Attending: Primary Care | Admitting: Primary Care

## 2020-05-10 ENCOUNTER — Encounter: Payer: Self-pay | Admitting: Primary Care

## 2020-05-10 ENCOUNTER — Ambulatory Visit: Payer: Managed Care, Other (non HMO) | Admitting: Primary Care

## 2020-05-10 ENCOUNTER — Other Ambulatory Visit: Payer: Self-pay

## 2020-05-10 VITALS — BP 132/82 | HR 87 | Temp 97.6°F | Ht 66.0 in | Wt 182.0 lb

## 2020-05-10 DIAGNOSIS — M79643 Pain in unspecified hand: Secondary | ICD-10-CM

## 2020-05-10 DIAGNOSIS — M2550 Pain in unspecified joint: Secondary | ICD-10-CM | POA: Diagnosis not present

## 2020-05-10 DIAGNOSIS — G8929 Other chronic pain: Secondary | ICD-10-CM

## 2020-05-10 LAB — SEDIMENTATION RATE: Sed Rate: 11 mm/hr (ref 0–30)

## 2020-05-10 LAB — URIC ACID: Uric Acid, Serum: 3.9 mg/dL (ref 2.4–7.0)

## 2020-05-10 LAB — C-REACTIVE PROTEIN: CRP: <1 mg/dL (ref 0.5–20.0)

## 2020-05-10 NOTE — Progress Notes (Signed)
Subjective:    Patient ID: Ashley [name], female    DOB: [birth year], 55 y.o.   MRN: [id number]  HPI  Ashley [name] is a very pleasant 55 y.o. female with a history of osteoarthritis, chronic joint pain, neuropathy TIA, vitamin B12 and D deficiency who presents today to discuss ongoing joint pain.  Her joint pain is located to the bilateral hands (fingers mostly), toes, right hip, bilateral shoulders. She was tested for rheumatoid arthritis in May of 2020, testing negative.   She's noticed pain to the PIP and DIP joints of her bilateral hands, sometimes swelling to the sites, also pain to her toes. She believes she's noticed the tips of her fingers pointing in a different direction. She would like to be re-tested for Rheumatoid arthritis as her mother has a diagnosis of rheumatoid arthritis.   She denies injury/trauma. She has noticed pain with grasping objects, denies weakness.    Review of Systems  Musculoskeletal: Positive for arthralgias and joint swelling.  Skin: Negative for color change.  Neurological: Negative for weakness.         Past Medical History:  Diagnosis Date  . Anemia   . Bronchitis    2 years ago   . [place] arthritis 2019   base of left thumb   . GERD (gastroesophageal reflux disease)    diet controlled , no meds  . Hydrosalpinx    bilateral   . Hyperlipidemia    recent dx - currently diet control - no meds  . Hypothyroid 2003   radio active iodine - no meds  . Left-sided weakness 02/12/2019  . SVD (spontaneous vaginal delivery)    x 1  . Urinary frequency 11/30/2018  . Vitamin B 12 deficiency     Social History   Socioeconomic History  . Marital status: Single    Spouse name: Not on file  . Number of children: Not on file  . Years of education: Not on file  . Highest education level: Not on file  Occupational History  . Not on file  Tobacco Use  . Smoking status: Never Smoker  . Smokeless tobacco: Never Used  Vaping Use  .  Vaping Use: Never used  Substance and Sexual Activity  . Alcohol use: No  . Drug use: No  . Sexual activity: Yes    Partners: Male    Birth control/protection: Surgical    Comment: hysterectomy.  Other Topics Concern  . Not on file  Social History Narrative   Single.   3 children.   Works at [employer] as a Designer, multimedia.   Enjoys relaxing, exercise, walking her dog.    Social Determinants of Health   Financial Resource Strain: Not on file  Food Insecurity: Not on file  Transportation Needs: Not on file  Physical Activity: Not on file  Stress: Not on file  Social Connections: Not on file  Intimate Partner Violence: Not on file    Past Surgical History:  Procedure Laterality Date  . CESAREAN SECTION     x 2  . CHOLECYSTECTOMY  2008  . DILATION AND CURETTAGE OF UTERUS     X 2  . LAPAROSCOPIC BILATERAL SALPINGECTOMY Bilateral 07/29/2017   Procedure: LAPAROSCOPIC LEFT SALPINGOOPHRECTOMY: Partial right Salpingectomy;  Surgeon: Lavonia Drafts, MD;  Location: [place];  Service: Gynecology;  Laterality: Bilateral;  . SHOULDER SURGERY  2016   arthroscopic  . TOTAL ABDOMINAL HYSTERECTOMY  2006   Fibroids, benign pathology; partial hysterectomy per patient   .  TUBAL LIGATION    . WISDOM TOOTH EXTRACTION     at age 54 yr  . WRIST ARTHROSCOPY Right 2007    Family History  Problem Relation Age of Onset  . Hypertension Mother   . Breast cancer Maternal Aunt   . Hypertension Maternal Grandmother   . Heart failure Maternal Grandmother   . Lung cancer Maternal Grandmother   . Other Father        unknown medical history  . Colon cancer Neg Hx   . Colon polyps Neg Hx   . Rectal cancer Neg Hx   . Stomach cancer Neg Hx     Allergies  Allergen Reactions  . Levaquin [Levofloxacin]     Presumed cause of joint aches    Current Outpatient Medications on File Prior to Visit  Medication Sig Dispense Refill  . amLODipine (NORVASC) 5 MG tablet Take 1 tablet (5 mg  total) by mouth daily. For blood pressure. 90 tablet 1  . aspirin 81 MG EC tablet Take by mouth.    Marland Kitchen azelastine (ASTELIN) 0.1 % nasal spray Place 2 sprays into both nostrils 2 (two) times daily. 30 mL 2  . fluticasone (FLONASE) 50 MCG/ACT nasal spray Place 1 spray into both nostrils 2 (two) times daily. 16 g 2  . gabapentin (NEURONTIN) 300 MG capsule Take 300 mg by mouth daily.    Marland Kitchen omeprazole (PRILOSEC) 40 MG capsule Take 1 capsule (40 mg total) by mouth daily. For heartburn. 30 capsule 0   No current facility-administered medications on file prior to visit.    BP 132/82   Pulse 87   Temp 97.6 F (36.4 C) (Temporal)   Ht 5\' 6"  (1.676 m)   Wt 182 lb (82.6 kg)   SpO2 97%   BMI 29.38 kg/m  Objective:   Physical Exam Cardiovascular:     Rate and Rhythm: Normal rate and regular rhythm.  Pulmonary:     Effort: Pulmonary effort is normal.  Musculoskeletal:        General: No swelling or tenderness.     Right hand: No swelling or bony tenderness. Normal range of motion. Normal strength.     Left hand: No swelling or bony tenderness. Normal range of motion. Normal strength.  Neurological:     Mental Status: She is alert.           Assessment & Plan:      This visit occurred during the SARS-CoV-2 public health emergency.  Safety protocols were in place, including screening questions prior to the visit, additional usage of staff PPE, and extensive cleaning of exam room while observing appropriate contact time as indicated for disinfecting solutions.

## 2020-05-10 NOTE — Assessment & Plan Note (Signed)
Chronic and continued, predominantly now to bilateral hands, hips, shoulders.  Negative work-up for rheumatoid arthritis in May 2020, agree to repeat testing given family history and symptoms.  We will also obtain plain films of bilateral hands and also test for gout.  Await results.

## 2020-05-10 NOTE — Assessment & Plan Note (Signed)
Bilaterally, PIP and DIP joints specifically.  Exam today grossly unremarkable.  Checking labs again for rheumatoid arthritis, also checking for gout. Plain films of bilateral hands pending.  Consider rheumatology referral if warranted.

## 2020-05-10 NOTE — Patient Instructions (Signed)
Stop by the lab and xray prior to leaving today. I will notify you of your results once received.   It was a pleasure to see you today!   

## 2020-05-11 LAB — RHEUMATOID FACTOR: Rheumatoid fact SerPl-aCnc: <14 IU/mL (ref <14)

## 2020-05-11 LAB — CYCLIC CITRUL PEPTIDE ANTIBODY, IGG: Cyclic Citrullin Peptide Ab: <16 UNITS

## 2020-05-19 DIAGNOSIS — M79643 Pain in unspecified hand: Secondary | ICD-10-CM

## 2020-05-19 DIAGNOSIS — G8929 Other chronic pain: Secondary | ICD-10-CM

## 2020-05-22 ENCOUNTER — Ambulatory Visit (INDEPENDENT_AMBULATORY_CARE_PROVIDER_SITE_OTHER): Payer: Managed Care, Other (non HMO) | Admitting: Obstetrics & Gynecology

## 2020-05-22 ENCOUNTER — Encounter: Payer: Self-pay | Admitting: Obstetrics & Gynecology

## 2020-05-22 ENCOUNTER — Other Ambulatory Visit (HOSPITAL_COMMUNITY)
Admission: RE | Admit: 2020-05-22 | Discharge: 2020-05-22 | Disposition: A | Discharge status: Home / Self Care | Payer: Managed Care, Other (non HMO) | Source: Ambulatory Visit | Attending: Obstetrics & Gynecology | Admitting: Obstetrics & Gynecology

## 2020-05-22 ENCOUNTER — Other Ambulatory Visit: Payer: Self-pay

## 2020-05-22 VITALS — BP 136/86 | HR 78 | Ht 66.0 in | Wt 182.0 lb

## 2020-05-22 DIAGNOSIS — Z01419 Encounter for gynecological examination (general) (routine) without abnormal findings: Secondary | ICD-10-CM

## 2020-05-22 DIAGNOSIS — Z113 Encounter for screening for infections with a predominantly sexual mode of transmission: Secondary | ICD-10-CM | POA: Insufficient documentation

## 2020-05-22 NOTE — Progress Notes (Signed)
GYNECOLOGY ANNUAL PREVENTATIVE CARE ENCOUNTER NOTE  History:     Ashley [name] is a 55 y.o. [phone] female here for a routine annual gynecologic exam.  Current complaints: no gynecologic concerns, but desires STI screening.  She is s/p total hysterectomy in 2006 for benign fibroids.  Denies abnormal vaginal bleeding, discharge, pelvic pain, problems with intercourse or other gynecologic concerns.    Gynecologic History No LMP recorded. Patient has had a hysterectomy. Last mammogram: 08/13/2019. Results were: normal  Obstetric History OB History  Gravida Para Term Preterm AB Living  5 3 3  0 2 3  SAB IAB Ectopic Multiple Live Births  2 0 0 0 3    # Outcome Date GA Lbr Len/2nd Weight Sex Delivery Anes PTL Lv  5 Term      Vag-Spont   LIV  4 Term      CS-Unspec   LIV  3 Term      CS-Unspec   LIV  2 SAB      SAB     1 SAB      SAB       Past Medical History:  Diagnosis Date  . Anemia   . Bronchitis    2 years ago   . [place] arthritis 2019   base of left thumb   . GERD (gastroesophageal reflux disease)    diet controlled , no meds  . Hydrosalpinx    bilateral   . Hyperlipidemia    recent dx - currently diet control - no meds  . Hypothyroid 2003   radio active iodine - no meds  . Left-sided weakness 02/12/2019  . SVD (spontaneous vaginal delivery)    x 1  . Urinary frequency 11/30/2018  . Vitamin B 12 deficiency     Past Surgical History:  Procedure Laterality Date  . CESAREAN SECTION     x 2  . CHOLECYSTECTOMY  2008  . DILATION AND CURETTAGE OF UTERUS     X 2  . LAPAROSCOPIC BILATERAL SALPINGECTOMY Bilateral 07/29/2017   Procedure: LAPAROSCOPIC LEFT SALPINGOOPHRECTOMY: Partial right Salpingectomy;  Surgeon: Lavonia Drafts, MD;  Location: [facility];  Service: Gynecology;  Laterality: Bilateral;  . SHOULDER SURGERY  2016   arthroscopic  . TOTAL ABDOMINAL HYSTERECTOMY  2006   Fibroids, benign pathology; partial hysterectomy per patient    . TUBAL LIGATION    . WISDOM TOOTH EXTRACTION     at age 78 yr  . WRIST ARTHROSCOPY Right 2007    Current Outpatient Medications on File Prior to Visit  Medication Sig Dispense Refill  . amLODipine (NORVASC) 5 MG tablet Take 1 tablet (5 mg total) by mouth daily. For blood pressure. 90 tablet 1  . aspirin 81 MG EC tablet Take by mouth.    Marland Kitchen azelastine (ASTELIN) 0.1 % nasal spray Place 2 sprays into both nostrils 2 (two) times daily. 30 mL 2  . fluticasone (FLONASE) 50 MCG/ACT nasal spray Place 1 spray into both nostrils 2 (two) times daily. 16 g 2  . gabapentin (NEURONTIN) 300 MG capsule Take 300 mg by mouth daily.    Marland Kitchen omeprazole (PRILOSEC) 40 MG capsule Take 1 capsule (40 mg total) by mouth daily. For heartburn. 30 capsule 0   No current facility-administered medications on file prior to visit.    Allergies  Allergen Reactions  . Levaquin [Levofloxacin]     Presumed cause of joint aches    Social History:  reports that she has never smoked. She has  never used smokeless tobacco. She reports that she does not drink alcohol and does not use drugs.  Family History  Problem Relation Age of Onset  . Hypertension Mother   . Breast cancer Maternal Aunt   . Hypertension Maternal Grandmother   . Heart failure Maternal Grandmother   . Lung cancer Maternal Grandmother   . Other Father        unknown medical history  . Colon cancer Neg Hx   . Colon polyps Neg Hx   . Rectal cancer Neg Hx   . Stomach cancer Neg Hx     The following portions of the patient's history were reviewed and updated as appropriate: allergies, current medications, past family history, past medical history, past social history, past surgical history and problem list.  Review of Systems Pertinent items noted in HPI and remainder of comprehensive ROS otherwise negative.  Physical Exam:  BP 136/86   Pulse 78   Ht 5\' 6"  (1.676 m)   Wt 182 lb (82.6 kg)   BMI 29.38 kg/m  CONSTITUTIONAL: Well-developed,  well-nourished female in no acute distress.  HENT:  Normocephalic, atraumatic, External right and left ear normal.  EYES: Conjunctivae and EOM are normal. Pupils are equal, round, and reactive to light. No scleral icterus.  NECK: Normal range of motion, supple, no masses.  Normal thyroid.  SKIN: Skin is warm and dry. No rash noted. Not diaphoretic. No erythema. No pallor. MUSCULOSKELETAL: Normal range of motion. No tenderness.  No cyanosis, clubbing, or edema. NEUROLOGIC: Alert and oriented to person, place, and time. Normal reflexes, muscle tone coordination.  PSYCHIATRIC: Normal mood and affect. Normal behavior. Normal judgment and thought content. CARDIOVASCULAR: Normal heart rate noted, regular rhythm RESPIRATORY: Clear to auscultation bilaterally. Effort and breath sounds normal, no problems with respiration noted. BREASTS: Symmetric in size. No masses, tenderness, skin changes, nipple drainage, or lymphadenopathy bilaterally. Performed in the presence of a chaperone. ABDOMEN: Soft, no distention noted.  No tenderness, rebound or guarding.  PELVIC:  Normal appearing external genitalia; normal appearing vaginal mucosa and well-healed cuff. White discharge noted, sample obtained. No tenderness on exam.  Performed in the presence of a chaperone.   Assessment and Plan:    1. Well woman exam with routine gynecological exam Normal pelvic exam and breast exam. Mammogram and labs ordered, will follow up results and manage accordingly. - MM 3D SCREEN BREAST BILATERAL; Future - Hepatitis B surface antigen - Hepatitis C antibody - HIV Antibody (routine testing w rflx) - RPR - Cervicovaginal ancillary only - CBC - TSH - Hemoglobin A1c - Comprehensive metabolic panel - Lipid panel Routine preventative health maintenance measures emphasized. Please refer to After Visit Summary for other counseling recommendations.      Verita Schneiders, MD, [place] for [employer], [place]

## 2020-05-22 NOTE — Patient Instructions (Signed)
Preventive Care 84-56 Years Old, Female Preventive care refers to lifestyle choices and visits with your health care provider that can promote health and wellness. This includes:  A yearly physical exam. This is also called an annual wellness visit.  Regular dental and eye exams.  Immunizations.  Screening for certain conditions.  Healthy lifestyle choices, such as: ? Eating a healthy diet. ? Getting regular exercise. ? Not using drugs or products that contain nicotine and tobacco. ? Limiting alcohol use. What can I expect for my preventive care visit? Physical exam Your health care provider will check your:  Height and weight. These may be used to calculate your BMI (body mass index). BMI is a measurement that tells if you are at a healthy weight.  Heart rate and blood pressure.  Body temperature.  Skin for abnormal spots. Counseling Your health care provider may ask you questions about your:  Past medical problems.  Family's medical history.  Alcohol, tobacco, and drug use.  Emotional well-being.  Home life and relationship well-being.  Sexual activity.  Diet, exercise, and sleep habits.  Work and work Statistician.  Access to firearms.  Method of birth control.  Menstrual cycle.  Pregnancy history. What immunizations do I need? Vaccines are usually given at various ages, according to a schedule. Your health care provider will recommend vaccines for you based on your age, medical history, and lifestyle or other factors, such as travel or where you work.   What tests do I need? Blood tests  Lipid and cholesterol levels. These may be checked every 5 years, or more often if you are over 3 years old.  Hepatitis C test.  Hepatitis B test. Screening  Lung cancer screening. You may have this screening every year starting at age 73 if you have a 30-pack-year history of smoking and currently smoke or have quit within the past 15 years.  Colorectal cancer  screening. ? All adults should have this screening starting at age 52 and continuing until age 17. ? Your health care provider may recommend screening at age 49 if you are at increased risk. ? You will have tests every 1-10 years, depending on your results and the type of screening test.  Diabetes screening. ? This is done by checking your blood sugar (glucose) after you have not eaten for a while (fasting). ? You may have this done every 1-3 years.  Mammogram. ? This may be done every 1-2 years. ? Talk with your health care provider about when you should start having regular mammograms. This may depend on whether you have a family history of breast cancer.  BRCA-related cancer screening. This may be done if you have a family history of breast, ovarian, tubal, or peritoneal cancers.  Pelvic exam and Pap test. ? This may be done every 3 years starting at age 10. ? Starting at age 11, this may be done every 5 years if you have a Pap test in combination with an HPV test. Other tests  STD (sexually transmitted disease) testing, if you are at risk.  Bone density scan. This is done to screen for osteoporosis. You may have this scan if you are at high risk for osteoporosis. Talk with your health care provider about your test results, treatment options, and if necessary, the need for more tests. Follow these instructions at home: Eating and drinking  Eat a diet that includes fresh fruits and vegetables, whole grains, lean protein, and low-fat dairy products.  Take vitamin and mineral supplements  as recommended by your health care provider.  Do not drink alcohol if: ? Your health care provider tells you not to drink. ? You are pregnant, may be pregnant, or are planning to become pregnant.  If you drink alcohol: ? Limit how much you have to 0-1 drink a day. ? Be aware of how much alcohol is in your drink. In the [place] one drink equals one 12 oz bottle of beer (355 mL), one 5 oz glass of  wine (148 mL), or one 1 oz glass of hard liquor (44 mL).   Lifestyle  Take daily care of your teeth and gums. Brush your teeth every morning and night with fluoride toothpaste. Floss one time each day.  Stay active. Exercise for at least 30 minutes 5 or more days each week.  Do not use any products that contain nicotine or tobacco, such as cigarettes, e-cigarettes, and chewing tobacco. If you need help quitting, ask your health care provider.  Do not use drugs.  If you are sexually active, practice safe sex. Use a condom or other form of protection to prevent STIs (sexually transmitted infections).  If you do not wish to become pregnant, use a form of birth control. If you plan to become pregnant, see your health care provider for a prepregnancy visit.  If told by your health care provider, take low-dose aspirin daily starting at age 50.  Find healthy ways to cope with stress, such as: ? Meditation, yoga, or listening to music. ? Journaling. ? Talking to a trusted person. ? Spending time with friends and family. Safety  Always wear your seat belt while driving or riding in a vehicle.  Do not drive: ? If you have been drinking alcohol. Do not ride with someone who has been drinking. ? When you are tired or distracted. ? While texting.  Wear a helmet and other protective equipment during sports activities.  If you have firearms in your house, make sure you follow all gun safety procedures. What's next?  Visit your health care provider once a year for an annual wellness visit.  Ask your health care provider how often you should have your eyes and teeth checked.  Stay up to date on all vaccines. This information is not intended to replace advice given to you by your health care provider. Make sure you discuss any questions you have with your health care provider. Document Revised: 11/16/2019 Document Reviewed: 10/23/2017 Elsevier Patient Education  2021 Elsevier Inc.  

## 2020-05-23 LAB — COMPREHENSIVE METABOLIC PANEL
ALT: 13 IU/L (ref 0–32)
AST: 20 IU/L (ref 0–40)
Albumin/Globulin Ratio: 1.7 (ref 1.2–2.2)
Albumin: 4.3 g/dL (ref 3.8–4.9)
Alkaline Phosphatase: 105 IU/L (ref 44–121)
BUN/Creatinine Ratio: 11 (ref 9–23)
BUN: 11 mg/dL (ref 6–24)
Bilirubin Total: <0.2 mg/dL (ref 0.0–1.2)
CO2: 25 mmol/L (ref 20–29)
Calcium: 9.7 mg/dL (ref 8.7–10.2)
Chloride: 104 mmol/L (ref 96–106)
Creatinine, Ser: 0.96 mg/dL (ref 0.57–1.00)
Globulin, Total: 2.6 g/dL (ref 1.5–4.5)
Glucose: 101 mg/dL — ABNORMAL HIGH (ref 65–99)
Potassium: 4.6 mmol/L (ref 3.5–5.2)
Sodium: 142 mmol/L (ref 134–144)
Total Protein: 6.9 g/dL (ref 6.0–8.5)
eGFR: 70 mL/min/{1.73_m2} (ref >59)

## 2020-05-23 LAB — CBC
Hematocrit: 40.3 % (ref 34.0–46.6)
Hemoglobin: 12.9 g/dL (ref 11.1–15.9)
MCH: 27.4 pg (ref 26.6–33.0)
MCHC: 32 g/dL (ref 31.5–35.7)
MCV: 86 fL (ref 79–97)
Platelets: 288 10*3/uL (ref 150–450)
RBC: 4.7 x10E6/uL (ref 3.77–5.28)
RDW: 14.1 % (ref 11.7–15.4)
WBC: 5.3 10*3/uL (ref 3.4–10.8)

## 2020-05-23 LAB — LIPID PANEL
Chol/HDL Ratio: 3 ratio (ref 0.0–4.4)
Cholesterol, Total: 247 mg/dL — ABNORMAL HIGH (ref 100–199)
HDL: 81 mg/dL (ref >39)
LDL Chol Calc (NIH): 145 mg/dL — ABNORMAL HIGH (ref 0–99)
Triglycerides: 120 mg/dL (ref 0–149)
VLDL Cholesterol Cal: 21 mg/dL (ref 5–40)

## 2020-05-23 LAB — HEMOGLOBIN A1C
Est. average glucose Bld gHb Est-mCnc: 117 mg/dL
Hgb A1c MFr Bld: 5.7 % — ABNORMAL HIGH (ref 4.8–5.6)

## 2020-05-23 LAB — HEPATITIS B SURFACE ANTIGEN: Hepatitis B Surface Ag: NEGATIVE

## 2020-05-23 LAB — HEPATITIS C ANTIBODY: Hep C Virus Ab: 0.1 s/co ratio (ref 0.0–0.9)

## 2020-05-23 LAB — RPR: RPR Ser Ql: NONREACTIVE

## 2020-05-23 LAB — HIV ANTIBODY (ROUTINE TESTING W REFLEX): HIV Screen 4th Generation wRfx: NONREACTIVE

## 2020-05-23 LAB — TSH: TSH: 1.18 u[IU]/mL (ref 0.450–4.500)

## 2020-05-24 LAB — CERVICOVAGINAL ANCILLARY ONLY
Bacterial Vaginitis (gardnerella): NEGATIVE
Candida Glabrata: NEGATIVE
Candida Vaginitis: NEGATIVE
Chlamydia: NEGATIVE
Comment: NEGATIVE
Comment: NEGATIVE
Comment: NEGATIVE
Comment: NEGATIVE
Comment: NEGATIVE
Comment: NORMAL
Neisseria Gonorrhea: NEGATIVE
Trichomonas: NEGATIVE

## 2020-06-15 ENCOUNTER — Ambulatory Visit (INDEPENDENT_AMBULATORY_CARE_PROVIDER_SITE_OTHER): Payer: Managed Care, Other (non HMO) | Admitting: Primary Care

## 2020-06-15 ENCOUNTER — Other Ambulatory Visit: Payer: Self-pay

## 2020-06-15 ENCOUNTER — Encounter: Payer: Self-pay | Admitting: Primary Care

## 2020-06-15 DIAGNOSIS — R7303 Prediabetes: Secondary | ICD-10-CM

## 2020-06-15 DIAGNOSIS — H8110 Benign paroxysmal vertigo, unspecified ear: Secondary | ICD-10-CM | POA: Insufficient documentation

## 2020-06-15 DIAGNOSIS — R42 Dizziness and giddiness: Secondary | ICD-10-CM

## 2020-06-15 DIAGNOSIS — E785 Hyperlipidemia, unspecified: Secondary | ICD-10-CM | POA: Diagnosis not present

## 2020-06-15 LAB — LIPID PANEL
Cholesterol: 234 mg/dL — ABNORMAL HIGH (ref 0–200)
HDL: 80.6 mg/dL (ref >39.00)
LDL Cholesterol: 141 mg/dL — ABNORMAL HIGH (ref 0–99)
NonHDL: 152.99
Total CHOL/HDL Ratio: 3
Triglycerides: 61 mg/dL (ref 0.0–149.0)
VLDL: 12.2 mg/dL (ref 0.0–40.0)

## 2020-06-15 NOTE — Assessment & Plan Note (Signed)
Chronic, sometimes improved. Recent A1C level of 5.7. continue to monitor.

## 2020-06-15 NOTE — Assessment & Plan Note (Signed)
Recent lipid panel with LDL of 145, non fasting. Repeat labs today. LDL goal should be below 100 or even 70 given history of TIA.

## 2020-06-15 NOTE — Assessment & Plan Note (Signed)
Treated during recent ED visit at [facility], notes/labs reviewed.  Improving with Meclizine 25 mg TID PRN, continue same. Discussed maneuvers at for home use.

## 2020-06-15 NOTE — Progress Notes (Signed)
Subjective:    Patient ID: Ashley [name], female    DOB: October 23, 1965, 55 y.o.   MRN: [id number]  HPI  Ashley [name] is a very pleasant 55 y.o. female with a history of chronic back pain, chronic joint pain, hypertension, TIA, vitamin deficiency who presents today for ED follow up.  She presented to [facility] ED on 06/05/20 with a chief complaint of dizziness. Exam reassuring and work up grossly negative. She was hypertensive during her stay. She was diagnosed with vertigo and prescribed Meclizine 25 mg and prednisone 40 mg daily for four days. She felt much better after taking Meclizine and undergoing Epley/Dix Hallpike maneuver. She was discharged home later that day.  Since her last visit she's feeling much better. She continues to experience some lightheadedness, intermittent, especially when rising too quickly, lasts for a few seconds.   She has an appointment with rheumatology scheduled for next week. Evaluated by GYN last month, LDL was above goal, she was not fasting.   BP Readings from Last 3 Encounters:  06/15/20 130/78  05/22/20 136/86  05/10/20 132/82     Review of Systems  Eyes: Negative for visual disturbance.  Respiratory: Negative for shortness of breath.   Cardiovascular: Negative for chest pain.  Musculoskeletal: Positive for arthralgias.  Neurological: Positive for light-headedness.         Past Medical History:  Diagnosis Date  . Anemia   . Bronchitis    2 years ago   . [place] arthritis 2019   base of left thumb   . GERD (gastroesophageal reflux disease)    diet controlled , no meds  . Hydrosalpinx    bilateral   . Hyperlipidemia    recent dx - currently diet control - no meds  . Hypothyroid 2003   radio active iodine - no meds  . Left-sided weakness 02/12/2019  . SVD (spontaneous vaginal delivery)    x 1  . Urinary frequency 11/30/2018  . Vitamin B 12 deficiency     Social History   Socioeconomic History  . Marital status: Single     Spouse name: Not on file  . Number of children: Not on file  . Years of education: Not on file  . Highest education level: Not on file  Occupational History  . Not on file  Tobacco Use  . Smoking status: Never Smoker  . Smokeless tobacco: Never Used  Vaping Use  . Vaping Use: Never used  Substance and Sexual Activity  . Alcohol use: No  . Drug use: No  . Sexual activity: Yes    Partners: Male    Birth control/protection: Surgical    Comment: hysterectomy.  Other Topics Concern  . Not on file  Social History Narrative   Single.   3 children.   Works at [employer] as a Designer, multimedia.   Enjoys relaxing, exercise, walking her dog.    Social Determinants of Health   Financial Resource Strain: Not on file  Food Insecurity: Not on file  Transportation Needs: Not on file  Physical Activity: Not on file  Stress: Not on file  Social Connections: Not on file  Intimate Partner Violence: Not on file    Past Surgical History:  Procedure Laterality Date  . CESAREAN SECTION     x 2  . CHOLECYSTECTOMY  2008  . DILATION AND CURETTAGE OF UTERUS     X 2  . LAPAROSCOPIC BILATERAL SALPINGECTOMY Bilateral 07/29/2017   Procedure: LAPAROSCOPIC LEFT SALPINGOOPHRECTOMY: Partial right Salpingectomy;  Surgeon: Lavonia Drafts, MD;  Location: [facility];  Service: Gynecology;  Laterality: Bilateral;  . SHOULDER SURGERY  2016   arthroscopic  . TOTAL ABDOMINAL HYSTERECTOMY  2006   Fibroids, benign pathology; partial hysterectomy per patient   . TUBAL LIGATION    . WISDOM TOOTH EXTRACTION     at age 63 yr  . WRIST ARTHROSCOPY Right 2007    Family History  Problem Relation Age of Onset  . Hypertension Mother   . Breast cancer Maternal Aunt   . Hypertension Maternal Grandmother   . Heart failure Maternal Grandmother   . Lung cancer Maternal Grandmother   . Other Father        unknown medical history  . Colon cancer Neg Hx   . Colon polyps Neg Hx   . Rectal cancer Neg Hx   .  Stomach cancer Neg Hx     Allergies  Allergen Reactions  . Levaquin [Levofloxacin]     Presumed cause of joint aches    Current Outpatient Medications on File Prior to Visit  Medication Sig Dispense Refill  . amLODipine (NORVASC) 5 MG tablet Take 1 tablet (5 mg total) by mouth daily. For blood pressure. 90 tablet 1  . aspirin 81 MG EC tablet Take by mouth.    Marland Kitchen azelastine (ASTELIN) 0.1 % nasal spray Place 2 sprays into both nostrils 2 (two) times daily. 30 mL 2  . fluticasone (FLONASE) 50 MCG/ACT nasal spray Place 1 spray into both nostrils 2 (two) times daily. 16 g 2  . gabapentin (NEURONTIN) 300 MG capsule Take 300 mg by mouth daily.    Marland Kitchen omeprazole (PRILOSEC) 40 MG capsule Take 1 capsule (40 mg total) by mouth daily. For heartburn. 30 capsule 0   No current facility-administered medications on file prior to visit.    BP 130/78   Pulse 80   Temp 97.8 F (36.6 C) (Temporal)   Ht 5\' 6"  (1.676 m)   Wt 184 lb 8 oz (83.7 kg)   SpO2 100%   BMI 29.78 kg/m  Objective:   Physical Exam Eyes:     Extraocular Movements: Extraocular movements intact.  Cardiovascular:     Rate and Rhythm: Normal rate and regular rhythm.  Pulmonary:     Effort: Pulmonary effort is normal.     Breath sounds: Normal breath sounds.  Musculoskeletal:     Cervical back: Neck supple.  Skin:    General: Skin is warm and dry.  Neurological:     Mental Status: She is oriented to person, place, and time.     Cranial Nerves: No cranial nerve deficit.           Assessment & Plan:      This visit occurred during the SARS-CoV-2 public health emergency.  Safety protocols were in place, including screening questions prior to the visit, additional usage of staff PPE, and extensive cleaning of exam room while observing appropriate contact time as indicated for disinfecting solutions.

## 2020-06-15 NOTE — Assessment & Plan Note (Deleted)
Diagnosed during recent ED visit at [facility], notes/labs reviewed.  Improving with Meclizine 25 mg TID PRN, continue same. Discussed maneuvers at for home use.

## 2020-06-15 NOTE — Patient Instructions (Signed)
Stop by the lab prior to leaving today. I will notify you of your results once received.   Use the Meclizine 25 mg as needed for vertigo.   It was a pleasure to see you today!

## 2020-06-16 DIAGNOSIS — E785 Hyperlipidemia, unspecified: Secondary | ICD-10-CM

## 2020-06-16 MED ORDER — ATORVASTATIN CALCIUM 20 MG PO TABS: 20.0000 mg | ORAL_TABLET | Freq: Every day | ORAL | 3 refills | Status: DC

## 2020-06-26 ENCOUNTER — Encounter: Payer: Managed Care, Other (non HMO) | Admitting: Primary Care

## 2020-08-07 ENCOUNTER — Encounter: Payer: Self-pay | Admitting: Podiatry

## 2020-08-07 ENCOUNTER — Ambulatory Visit: Payer: Managed Care, Other (non HMO) | Admitting: Podiatry

## 2020-08-07 ENCOUNTER — Other Ambulatory Visit: Payer: Self-pay

## 2020-08-07 ENCOUNTER — Ambulatory Visit (INDEPENDENT_AMBULATORY_CARE_PROVIDER_SITE_OTHER): Payer: Managed Care, Other (non HMO)

## 2020-08-07 DIAGNOSIS — M216X1 Other acquired deformities of right foot: Secondary | ICD-10-CM | POA: Diagnosis not present

## 2020-08-07 DIAGNOSIS — M7752 Other enthesopathy of left foot: Secondary | ICD-10-CM

## 2020-08-07 DIAGNOSIS — M216X2 Other acquired deformities of left foot: Secondary | ICD-10-CM

## 2020-08-07 DIAGNOSIS — M7741 Metatarsalgia, right foot: Secondary | ICD-10-CM | POA: Diagnosis not present

## 2020-08-07 DIAGNOSIS — M7750 Other enthesopathy of unspecified foot: Secondary | ICD-10-CM

## 2020-08-07 DIAGNOSIS — M7751 Other enthesopathy of right foot: Secondary | ICD-10-CM | POA: Diagnosis not present

## 2020-08-07 DIAGNOSIS — G5753 Tarsal tunnel syndrome, bilateral lower limbs: Secondary | ICD-10-CM | POA: Diagnosis not present

## 2020-08-07 DIAGNOSIS — M7742 Metatarsalgia, left foot: Secondary | ICD-10-CM | POA: Diagnosis not present

## 2020-08-07 NOTE — Progress Notes (Signed)
  Subjective:  Patient ID: Ashley [name], female    DOB: [birth year],  MRN: [id number]  Chief Complaint  Patient presents with   Foot Pain    (xrays)NP/ I BEEN HAVING FOOT PAIN AND ANKLE PAIN BOTH FEET/ REQ DAY    55 y.o. female presents with the above complaint. History confirmed with patient.'s been going on for some time.  She saw her rheumatologist about a month ago.  They diagnosed with arthritis in her hands and work-up for gout and rheumatoid arthritis which was negative.  She does have a history of cervicalgia and protruding disks in her upper extremities which she sees a neurologist Dr. Melrose Nakayama for.  She has complete numbness in both heels.  Burning sensation of the bottom of the foot and toes.  Sometimes feels a sharp pain.  She has had injections for the neck.  Objective:  Physical Exam: warm, good capillary refill, no trophic changes or ulcerative lesions, normal DP and PT pulses, she is abnormal sensation on the plantar heel, positive Tinel's sign in the tarsal tunnel bilaterally and radiating pain in the first plantar interspace . X-rays of both feet taken, no significant degenerative changes, no fracture or dislocation, she has mild pes cavus deformity Assessment:   1. Tarsal tunnel syndrome, bilateral   2. Metatarsalgia of both feet      Plan:  Patient was evaluated and treated and all questions answered.  Reviewed the radiographic findings with her.  I think she does have some evidence of tarsal tunnel syndrome.  Unclear if this is a primary etiology within the foot and ankle itself or if this is secondary to her lumbar spine issues.  She will discuss her back issues with her PCP and her neurologist as well.  I think it would be beneficial to have neurodiagnostic testing with NCV and EMG on the lower extremities bilateral.  I placed an order for this and given to her to take to her neurologist.  If this is unrevealing or shows evidence of tarsal tunnel that is not  secondary to her spinal issues then would order MRI to evaluate the bilateral tarsal tunnel.  Follow-up after testing.  We discussed medical treatment from her PCP and neurologist with gabapentin which she already takes.  Return in about 2 months (around 10/07/2020).

## 2020-08-14 ENCOUNTER — Ambulatory Visit: Payer: Managed Care, Other (non HMO)

## 2020-08-15 ENCOUNTER — Ambulatory Visit: Payer: 59 | Admitting: Primary Care

## 2020-08-21 ENCOUNTER — Ambulatory Visit: Payer: Managed Care, Other (non HMO)

## 2020-08-24 ENCOUNTER — Ambulatory Visit: Payer: 59 | Admitting: Primary Care

## 2020-10-03 ENCOUNTER — Encounter: Payer: Self-pay | Admitting: Family Medicine

## 2020-10-03 ENCOUNTER — Telehealth (INDEPENDENT_AMBULATORY_CARE_PROVIDER_SITE_OTHER): Payer: Managed Care, Other (non HMO) | Admitting: Family Medicine

## 2020-10-03 DIAGNOSIS — R059 Cough, unspecified: Secondary | ICD-10-CM | POA: Diagnosis not present

## 2020-10-03 DIAGNOSIS — K219 Gastro-esophageal reflux disease without esophagitis: Secondary | ICD-10-CM | POA: Diagnosis not present

## 2020-10-03 DIAGNOSIS — R002 Palpitations: Secondary | ICD-10-CM | POA: Diagnosis not present

## 2020-10-03 MED ORDER — FAMOTIDINE 20 MG PO TABS: 20.0000 mg | ORAL_TABLET | Freq: Every day | ORAL | Status: DC

## 2020-10-03 MED ORDER — BENZONATATE 200 MG PO CAPS: 200.0000 mg | ORAL_CAPSULE | Freq: Three times a day (TID) | ORAL | 1 refills | Status: DC | PRN

## 2020-10-03 MED ORDER — OMEPRAZOLE 40 MG PO CPDR: 40.0000 mg | DELAYED_RELEASE_CAPSULE | Freq: Two times a day (BID) | ORAL | Status: DC

## 2020-10-03 NOTE — Progress Notes (Signed)
Virtual visit completed through WebEx or similar program Patient location: at work  Provider location: Financial controller at [facility], office  Participants: Patient and me (unless stated otherwise below)  Pandemic considerations d/w pt.   Limitations and rationale for visit method d/w patient.  Patient agreed to proceed.   CC: f/u.    HPI:  she had covid about 3.5 week ago. Prior to covid her GERD was controlled. Now it is worse in the meantime.  Still with cough.  Belching helps with GERD sx.  She is working on her diet, avoiding trigger foods, but still having troubles.  No sputum.  Some post nasal gtt.  No fevers.  No hemoptysis.  No vomiting.  He throat gets irritated with coughing a lot.  No dysphagia.  No oral pain.  She has some episodic epigastic pain, can be variable.  Off nsaids in the meantime.  Restarted omeprazole '40mg'$  daily, recently restarted.  Not SOB.  No wheeze.    She had noted some palpitations after having covid.  Not lightheaded.  No syncope.  Sx comes and goes.  She didn't have this prior to covid.  No exertional CP.    Her mother recently died, d/w pt.  Condolences offered.    Meds and allergies reviewed.   ROS: Per HPI unless specifically indicated in ROS section   NAD Speech wnl  A/P: Cough/GERD.  Recent COVID history noted.  Discussed with patient about options. Try BID PPI and add on pepcid '20mg'$  daily for GERD.  Try tessalon for cough.   Discussed with patient about recent palpitations.  Not lightheaded.  Still okay for outpatient follow-up.  No exertional chest pain.  Stressors noted with recent death of mother.  Reasonable to get her in for an in person office visit/lab/EKG.  Discussed.  We will work on getting that set up.

## 2020-10-04 DIAGNOSIS — R053 Chronic cough: Secondary | ICD-10-CM | POA: Insufficient documentation

## 2020-10-04 DIAGNOSIS — R002 Palpitations: Secondary | ICD-10-CM | POA: Insufficient documentation

## 2020-10-04 DIAGNOSIS — R058 Other specified cough: Secondary | ICD-10-CM | POA: Insufficient documentation

## 2020-10-04 DIAGNOSIS — R059 Cough, unspecified: Secondary | ICD-10-CM | POA: Insufficient documentation

## 2020-10-04 NOTE — Assessment & Plan Note (Signed)
Cough/GERD.  Recent COVID history noted.  Discussed with patient about options. Try BID PPI and add on pepcid '20mg'$  daily for GERD.  Try tessalon for cough.

## 2020-10-04 NOTE — Assessment & Plan Note (Signed)
   Discussed with patient about recent palpitations.  Not lightheaded.  Still okay for outpatient follow-up.  No exertional chest pain.  Stressors noted with recent death of mother.  Reasonable to get her in for an in person office visit/lab/EKG.  Discussed.  We will work on getting that set up.

## 2020-10-05 ENCOUNTER — Encounter: Payer: Self-pay | Admitting: Family Medicine

## 2020-10-05 ENCOUNTER — Other Ambulatory Visit: Payer: Self-pay

## 2020-10-05 ENCOUNTER — Ambulatory Visit: Payer: Managed Care, Other (non HMO) | Admitting: Family Medicine

## 2020-10-05 VITALS — BP 126/84 | HR 87 | Temp 98.0°F | Ht 66.0 in | Wt 177.0 lb

## 2020-10-05 DIAGNOSIS — R002 Palpitations: Secondary | ICD-10-CM

## 2020-10-05 DIAGNOSIS — K219 Gastro-esophageal reflux disease without esophagitis: Secondary | ICD-10-CM

## 2020-10-05 DIAGNOSIS — R739 Hyperglycemia, unspecified: Secondary | ICD-10-CM

## 2020-10-05 DIAGNOSIS — R7303 Prediabetes: Secondary | ICD-10-CM

## 2020-10-05 DIAGNOSIS — R059 Cough, unspecified: Secondary | ICD-10-CM | POA: Diagnosis not present

## 2020-10-05 DIAGNOSIS — E785 Hyperlipidemia, unspecified: Secondary | ICD-10-CM | POA: Diagnosis not present

## 2020-10-05 LAB — COMPREHENSIVE METABOLIC PANEL
ALT: 13 U/L (ref 0–35)
AST: 18 U/L (ref 0–37)
Albumin: 4.3 g/dL (ref 3.5–5.2)
Alkaline Phosphatase: 99 U/L (ref 39–117)
BUN: 8 mg/dL (ref 6–23)
CO2: 30 mEq/L (ref 19–32)
Calcium: 10 mg/dL (ref 8.4–10.5)
Chloride: 102 mEq/L (ref 96–112)
Creatinine, Ser: 0.72 mg/dL (ref 0.40–1.20)
GFR: 94 mL/min (ref >60.00)
Glucose, Bld: 93 mg/dL (ref 70–99)
Potassium: 4.5 mEq/L (ref 3.5–5.1)
Sodium: 140 mEq/L (ref 135–145)
Total Bilirubin: 0.6 mg/dL (ref 0.2–1.2)
Total Protein: 7.4 g/dL (ref 6.0–8.3)

## 2020-10-05 LAB — CBC WITH DIFFERENTIAL/PLATELET
Basophils Absolute: 0 10*3/uL (ref 0.0–0.1)
Basophils Relative: 0.3 % (ref 0.0–3.0)
Eosinophils Absolute: 0.1 10*3/uL (ref 0.0–0.7)
Eosinophils Relative: 1.3 % (ref 0.0–5.0)
HCT: 41.5 % (ref 36.0–46.0)
Hemoglobin: 13.3 g/dL (ref 12.0–15.0)
Lymphocytes Relative: 25.9 % (ref 12.0–46.0)
Lymphs Abs: 1.4 10*3/uL (ref 0.7–4.0)
MCHC: 32.1 g/dL (ref 30.0–36.0)
MCV: 85.6 fl (ref 78.0–100.0)
Monocytes Absolute: 0.4 10*3/uL (ref 0.1–1.0)
Monocytes Relative: 8.3 % (ref 3.0–12.0)
Neutro Abs: 3.4 10*3/uL (ref 1.4–7.7)
Neutrophils Relative %: 64.2 % (ref 43.0–77.0)
Platelets: 234 10*3/uL (ref 150.0–400.0)
RBC: 4.85 Mil/uL (ref 3.87–5.11)
RDW: 16.4 % — ABNORMAL HIGH (ref 11.5–15.5)
WBC: 5.3 10*3/uL (ref 4.0–10.5)

## 2020-10-05 LAB — LIPID PANEL
Cholesterol: 231 mg/dL — ABNORMAL HIGH (ref 0–200)
HDL: 76.7 mg/dL (ref >39.00)
LDL Cholesterol: 141 mg/dL — ABNORMAL HIGH (ref 0–99)
NonHDL: 153.94
Total CHOL/HDL Ratio: 3
Triglycerides: 64 mg/dL (ref 0.0–149.0)
VLDL: 12.8 mg/dL (ref 0.0–40.0)

## 2020-10-05 LAB — HEMOGLOBIN A1C: Hgb A1c MFr Bld: 6 % (ref 4.6–6.5)

## 2020-10-05 LAB — TSH: TSH: 1.88 u[IU]/mL (ref 0.35–5.50)

## 2020-10-05 MED ORDER — ATORVASTATIN CALCIUM 20 MG PO TABS: 10.0000 mg | ORAL_TABLET | Freq: Every day | ORAL | Status: DC

## 2020-10-05 NOTE — Progress Notes (Signed)
This visit occurred during the SARS-CoV-2 public health emergency.  Safety protocols were in place, including screening questions prior to the visit, additional usage of staff PPE, and extensive cleaning of exam room while observing appropriate contact time as indicated for disinfecting solutions.  Condolences given re: her mother's death.   Palpitations.  Episodic.  EKG wnl, d/w pt.  Sx prev happened a few times a day.  Can happen at rest.  Less episodes in the last few days. Episodes last about 1 minute.  Quick onset, quick resolution.  No CP.  She hasn't set her watch to monitor heart rate but she can.  She hasn't checked pulse during an event.     Cough.  Not SOB.  Not sig different yet, still happens in fits.  She thought it was GERD exacerbated/related.    GERD.  She increased her PPI and added on pepcid.  Less sx with eating.  Sx some better in the meantime.    On '10mg'$  lipitor.  No aches.  Due for recheck labs.    H/o mild sugar elevation, recheck A1c pending.    Meds, vitals, and allergies reviewed.   ROS: Per HPI unless specifically indicated in ROS section   GEN: nad, alert and oriented HEENT: ncat NECK: supple w/o LA CV: rrr PULM: ctab, no inc wob ABD: soft, +bs EXT: no edema SKIN: no acute rash  EKG within normal limits and discussed with patient at office visit.

## 2020-10-05 NOTE — Patient Instructions (Signed)
Don't change your meds for now.  Take care.  Glad to see you. Go to the lab on the way out.   If you have mychart we'll likely use that to update you.    If you keep having trouble with elevated heart rates, then let us know.

## 2020-10-08 NOTE — Assessment & Plan Note (Signed)
No meds.  See notes on labs. 

## 2020-10-08 NOTE — Assessment & Plan Note (Signed)
Lungs are clear.  She will continue with GERD treatment (Pepcid and omeprazole) and update me as needed.

## 2020-10-08 NOTE — Assessment & Plan Note (Signed)
On atorvastatin 10 mg a day.  See notes on labs.

## 2020-10-08 NOTE — Assessment & Plan Note (Signed)
Lungs are clear.  She will continue with GERD treatment and update me as needed.

## 2020-10-08 NOTE — Assessment & Plan Note (Signed)
EKG normal.  See notes on labs.  I asked her to check her heart rate on her watch and let us know if she has any more symptoms.  Still okay for outpatient follow-up.  She agrees with plan.  Routed to PCP as FYI.

## 2020-10-16 ENCOUNTER — Ambulatory Visit: Payer: Managed Care, Other (non HMO) | Admitting: Podiatry

## 2020-10-18 ENCOUNTER — Telehealth: Payer: Self-pay | Admitting: Primary Care

## 2020-10-18 NOTE — Telephone Encounter (Signed)
See lab note for documentation.  

## 2020-10-18 NOTE — Telephone Encounter (Signed)
Ms. Shiran called in returning phone call from [facility]

## 2020-10-24 ENCOUNTER — Other Ambulatory Visit: Payer: Self-pay | Admitting: Primary Care

## 2020-10-24 DIAGNOSIS — E785 Hyperlipidemia, unspecified: Secondary | ICD-10-CM

## 2020-10-25 ENCOUNTER — Other Ambulatory Visit: Payer: Self-pay | Admitting: Primary Care

## 2020-10-25 DIAGNOSIS — E785 Hyperlipidemia, unspecified: Secondary | ICD-10-CM

## 2020-10-25 MED ORDER — ATORVASTATIN CALCIUM 20 MG PO TABS: 20.0000 mg | ORAL_TABLET | Freq: Every day | ORAL | 1 refills | Status: DC

## 2020-12-11 ENCOUNTER — Telehealth: Payer: Self-pay | Admitting: Primary Care

## 2020-12-11 NOTE — Telephone Encounter (Signed)
Pt called she wanted to know if the atorvastatin (LIPITOR) 20 MG tablet would cause muscle pain. She has noticed it more since being on the Lipitor she said that it's different than her nerve pain

## 2020-12-12 NOTE — Telephone Encounter (Addendum)
Yes, the atorvastatin cholesterol medication can cause muscle pain. Have her try 1/2 tablet x2 weeks to see if this helps.   Have her call us back in 2 weeks with an update. Or send mychart message.

## 2020-12-13 NOTE — Telephone Encounter (Signed)
Patient sent my chart message following up on this call. I have responded with information from [place] and request that she call if any questions.

## 2020-12-18 ENCOUNTER — Ambulatory Visit: Payer: Managed Care, Other (non HMO)

## 2021-01-01 ENCOUNTER — Other Ambulatory Visit: Payer: Self-pay

## 2021-01-01 ENCOUNTER — Other Ambulatory Visit (INDEPENDENT_AMBULATORY_CARE_PROVIDER_SITE_OTHER): Payer: Managed Care, Other (non HMO)

## 2021-01-01 ENCOUNTER — Other Ambulatory Visit: Payer: Managed Care, Other (non HMO)

## 2021-01-01 DIAGNOSIS — E785 Hyperlipidemia, unspecified: Secondary | ICD-10-CM | POA: Diagnosis not present

## 2021-01-01 NOTE — Telephone Encounter (Signed)
Pt called in requesting to know what labs was order . Would like a call back [phone]

## 2021-01-02 LAB — LIPID PANEL
Cholesterol: 180 mg/dL (ref 0–200)
HDL: 67.8 mg/dL (ref >39.00)
LDL Cholesterol: 95 mg/dL (ref 0–99)
NonHDL: 111.83
Total CHOL/HDL Ratio: 3
Triglycerides: 84 mg/dL (ref 0.0–149.0)
VLDL: 16.8 mg/dL (ref 0.0–40.0)

## 2021-01-02 LAB — HEPATIC FUNCTION PANEL
ALT: 26 U/L (ref 0–35)
AST: 36 U/L (ref 0–37)
Albumin: 4.4 g/dL (ref 3.5–5.2)
Alkaline Phosphatase: 74 U/L (ref 39–117)
Bilirubin, Direct: 0.1 mg/dL (ref 0.0–0.3)
Total Bilirubin: 0.5 mg/dL (ref 0.2–1.2)
Total Protein: 7 g/dL (ref 6.0–8.3)

## 2021-01-02 NOTE — Telephone Encounter (Signed)
Left message to return call to our office.  

## 2021-01-09 NOTE — Telephone Encounter (Signed)
Called patient she had some questions about labs but has had done. Patient is having flank pain with dark urine. We have scheduled office visit for patiens first available that is next week. Will call if any changes in symptoms or would like to be seen sooner.

## 2021-01-15 ENCOUNTER — Ambulatory Visit
Admission: RE | Admit: 2021-01-15 | Discharge: 2021-01-15 | Disposition: A | Discharge status: Home / Self Care | Payer: Managed Care, Other (non HMO) | Source: Ambulatory Visit | Attending: Obstetrics & Gynecology | Admitting: Obstetrics & Gynecology

## 2021-01-15 ENCOUNTER — Other Ambulatory Visit: Payer: Self-pay

## 2021-01-15 DIAGNOSIS — Z01419 Encounter for gynecological examination (general) (routine) without abnormal findings: Secondary | ICD-10-CM

## 2021-01-16 ENCOUNTER — Encounter: Payer: Self-pay | Admitting: Primary Care

## 2021-01-16 ENCOUNTER — Ambulatory Visit (INDEPENDENT_AMBULATORY_CARE_PROVIDER_SITE_OTHER)
Admission: RE | Admit: 2021-01-16 | Discharge: 2021-01-16 | Disposition: A | Discharge status: Home / Self Care | Payer: Managed Care, Other (non HMO) | Source: Ambulatory Visit | Attending: Primary Care | Admitting: Primary Care

## 2021-01-16 ENCOUNTER — Ambulatory Visit: Payer: Managed Care, Other (non HMO) | Admitting: Primary Care

## 2021-01-16 VITALS — BP 128/76 | HR 86 | Temp 97.6°F | Resp 12 | Ht 66.0 in | Wt 176.4 lb

## 2021-01-16 DIAGNOSIS — R1084 Generalized abdominal pain: Secondary | ICD-10-CM

## 2021-01-16 DIAGNOSIS — R102 Pelvic and perineal pain: Secondary | ICD-10-CM | POA: Diagnosis not present

## 2021-01-16 LAB — POC URINALSYSI DIPSTICK (AUTOMATED)
Bilirubin, UA: NEGATIVE
Blood, UA: NEGATIVE
Glucose, UA: NEGATIVE
Ketones, UA: NEGATIVE
Leukocytes, UA: NEGATIVE
Nitrite, UA: NEGATIVE
Protein, UA: POSITIVE — AB
Spec Grav, UA: >=1.03 — AB (ref 1.010–1.025)
Urobilinogen, UA: 0.2 E.U./dL
pH, UA: 5.5 (ref 5.0–8.0)

## 2021-01-16 NOTE — Progress Notes (Signed)
Subjective:    Patient ID: Ashley [name], female    DOB: October 03, 1965, 55 y.o.   MRN: [id number]  HPI  Ashley [name] is a very pleasant 55 y.o. female with a history of TIA, hypertension, GERD, ovarian cyst (left), osteoarthritis, urinary frequency who presents today to discuss abdominal pain.  Her pain is located to the generalized abdomen which will move from the bilateral upper, mid, or lower regions, sometimes radiates around to her sides and to pelvis. This began about one month ago and is intermittently occurring several times weekly lasting about 1 hour. She describes this pain like a "sharp and pulsating pain".   Pain doesn't occur with eating. Typically is having bowel movements every other day, large and soft. Recently she's noticed bowel movements every 3-4 days and are more firm than usual.   She denies diarrhea, bloody stools, nausea, vomiting. She's not taken anything OTC to help with stools.   History of hysterectomy and left oophorectomy, has right ovary remaining. Family history of ovarian cancer in mother.    Review of Systems  Constitutional:  Negative for fever.  Gastrointestinal:  Positive for abdominal pain and constipation. Negative for blood in stool, diarrhea, nausea and vomiting.  Genitourinary:  Positive for pelvic pain.        Past Medical History:  Diagnosis Date   Anemia    Bronchitis    2 years ago    [place] arthritis 2019   base of left thumb    GERD (gastroesophageal reflux disease)    diet controlled , no meds   Hydrosalpinx    bilateral    Hyperlipidemia    recent dx - currently diet control - no meds   Hypothyroid 2003   radio active iodine - no meds   Left-sided weakness 02/12/2019   SVD (spontaneous vaginal delivery)    x 1   Urinary frequency 11/30/2018   Vitamin B 12 deficiency     Social History   Socioeconomic History   Marital status: Single    Spouse name: Not on file   Number of children: Not on file    Years of education: Not on file   Highest education level: Not on file  Occupational History   Not on file  Tobacco Use   Smoking status: Never   Smokeless tobacco: Never  Vaping Use   Vaping Use: Never used  Substance and Sexual Activity   Alcohol use: No   Drug use: No   Sexual activity: Yes    Partners: Male    Birth control/protection: Surgical    Comment: hysterectomy.  Other Topics Concern   Not on file  Social History Narrative   Single.   3 children.   Works at [employer] as a Designer, multimedia.   Enjoys relaxing, exercise, walking her dog.    Social Determinants of Health   Financial Resource Strain: Not on file  Food Insecurity: Not on file  Transportation Needs: Not on file  Physical Activity: Not on file  Stress: Not on file  Social Connections: Not on file  Intimate Partner Violence: Not on file    Past Surgical History:  Procedure Laterality Date   CESAREAN SECTION     x 2   CHOLECYSTECTOMY  2008   DILATION AND CURETTAGE OF UTERUS     X 2   LAPAROSCOPIC BILATERAL SALPINGECTOMY Bilateral 07/29/2017   Procedure: LAPAROSCOPIC LEFT SALPINGOOPHRECTOMY: Partial right Salpingectomy;  Surgeon: Lavonia Drafts, MD;  Location: [facility];  Service: Gynecology;  Laterality: Bilateral;   SHOULDER SURGERY  2016   arthroscopic   TOTAL ABDOMINAL HYSTERECTOMY  2006   Fibroids, benign pathology; partial hysterectomy per patient    TUBAL LIGATION     WISDOM TOOTH EXTRACTION     at age 57 yr   WRIST ARTHROSCOPY Right 2007    Family History  Problem Relation Age of Onset   Hypertension Mother    Other Father        unknown medical history   Hypertension Maternal Grandmother    Heart failure Maternal Grandmother    Lung cancer Maternal Grandmother    Breast cancer Maternal Aunt    Colon cancer Neg Hx    Colon polyps Neg Hx    Rectal cancer Neg Hx    Stomach cancer Neg Hx     Allergies  Allergen Reactions   Levaquin [Levofloxacin]     Presumed cause  of joint aches    Current Outpatient Medications on File Prior to Visit  Medication Sig Dispense Refill   amLODipine (NORVASC) 5 MG tablet Take 1 tablet (5 mg total) by mouth daily. For blood pressure. 90 tablet 1   aspirin 81 MG EC tablet Take by mouth.     atorvastatin (LIPITOR) 20 MG tablet Take 1 tablet (20 mg total) by mouth daily. For cholesterol. 90 tablet 1   azelastine (ASTELIN) 0.1 % nasal spray Place into the nose.     famotidine (PEPCID) 20 MG tablet Take 1 tablet (20 mg total) by mouth daily.     fluticasone (FLONASE) 50 MCG/ACT nasal spray Place 1 spray into both nostrils 2 (two) times daily. 16 g 2   gabapentin (NEURONTIN) 600 MG tablet Take 1,200 mg by mouth daily.     meclizine (ANTIVERT) 25 MG tablet Take 25 mg by mouth 3 (three) times daily as needed.     DULoxetine (CYMBALTA) 20 MG capsule Take Cymbalta 20 mg once daily for one week, then increase to 20 mg twice daily. (Patient not taking: Reported on 01/16/2021)     No current facility-administered medications on file prior to visit.    BP 128/76   Pulse 86   Temp 97.6 F (36.4 C)   Resp 12   Ht 5\' 6"  (1.676 m)   Wt 176 lb 6 oz (80 kg)   SpO2 98%   BMI 28.47 kg/m  Objective:   Physical Exam Cardiovascular:     Rate and Rhythm: Normal rate and regular rhythm.  Pulmonary:     Effort: Pulmonary effort is normal.  Abdominal:     Tenderness: There is abdominal tenderness in the periumbilical area and left lower quadrant. There is no guarding.    Musculoskeletal:     Cervical back: Neck supple.  Skin:    General: Skin is warm and dry.          Assessment & Plan:      This visit occurred during the SARS-CoV-2 public health emergency.  Safety protocols were in place, including screening questions prior to the visit, additional usage of staff PPE, and extensive cleaning of exam room while observing appropriate contact time as indicated for disinfecting solutions.

## 2021-01-16 NOTE — Assessment & Plan Note (Signed)
Repeat pelvic transvaginal ultrasound ordered and pending, especially given FH of ovarian cancer in mother.

## 2021-01-16 NOTE — Patient Instructions (Signed)
You will be contacted regarding your ultrasound  Please let us know if you have not been contacted within two weeks.   Complete xray(s) prior to leaving today. I will notify you of your results once received.  You can try Miralax or Colace (stool softener) daily as needed for constipation/firm stools.  Ensure you are consuming 64 ounces of water daily.  It was a pleasure to see you today!

## 2021-01-16 NOTE — Assessment & Plan Note (Signed)
Suspect constipation.  Checking abdominal plain films today.  Discussed use of Miralax vs Colace, water intake, regular exercise, high fiber diet.  She appears very stable today.

## 2021-01-22 ENCOUNTER — Ambulatory Visit: Payer: Managed Care, Other (non HMO) | Admitting: Podiatry

## 2021-01-29 ENCOUNTER — Encounter: Payer: Self-pay | Admitting: *Deleted

## 2021-02-21 ENCOUNTER — Encounter: Payer: Self-pay | Admitting: Primary Care

## 2021-03-02 ENCOUNTER — Other Ambulatory Visit: Payer: Self-pay | Admitting: Primary Care

## 2021-03-02 ENCOUNTER — Encounter: Payer: Self-pay | Admitting: Primary Care

## 2021-03-02 ENCOUNTER — Encounter: Payer: Self-pay | Admitting: *Deleted

## 2021-03-02 ENCOUNTER — Other Ambulatory Visit: Payer: Self-pay

## 2021-03-02 ENCOUNTER — Ambulatory Visit
Admission: RE | Admit: 2021-03-02 | Discharge: 2021-03-02 | Disposition: A | Discharge status: Home / Self Care | Payer: Managed Care, Other (non HMO) | Source: Ambulatory Visit | Attending: Primary Care | Admitting: Primary Care

## 2021-03-02 ENCOUNTER — Ambulatory Visit: Payer: Managed Care, Other (non HMO) | Admitting: Primary Care

## 2021-03-02 ENCOUNTER — Telehealth: Payer: Self-pay

## 2021-03-02 VITALS — BP 124/72 | HR 85 | Temp 97.6°F | Ht 66.0 in | Wt 182.0 lb

## 2021-03-02 DIAGNOSIS — K5730 Diverticulosis of large intestine without perforation or abscess without bleeding: Secondary | ICD-10-CM | POA: Insufficient documentation

## 2021-03-02 DIAGNOSIS — Z8041 Family history of malignant neoplasm of ovary: Secondary | ICD-10-CM | POA: Diagnosis not present

## 2021-03-02 DIAGNOSIS — R1084 Generalized abdominal pain: Secondary | ICD-10-CM

## 2021-03-02 DIAGNOSIS — R1031 Right lower quadrant pain: Secondary | ICD-10-CM

## 2021-03-02 DIAGNOSIS — K219 Gastro-esophageal reflux disease without esophagitis: Secondary | ICD-10-CM | POA: Diagnosis not present

## 2021-03-02 DIAGNOSIS — R350 Frequency of micturition: Secondary | ICD-10-CM | POA: Insufficient documentation

## 2021-03-02 HISTORY — DX: Essential (primary) hypertension: I10

## 2021-03-02 LAB — CBC WITH DIFFERENTIAL/PLATELET
Basophils Absolute: 0 10*3/uL (ref 0.0–0.1)
Basophils Relative: 0.5 % (ref 0.0–3.0)
Eosinophils Absolute: 0 10*3/uL (ref 0.0–0.7)
Eosinophils Relative: 1.1 % (ref 0.0–5.0)
HCT: 40.3 % (ref 36.0–46.0)
Hemoglobin: 12.8 g/dL (ref 12.0–15.0)
Lymphocytes Relative: 38.3 % (ref 12.0–46.0)
Lymphs Abs: 1.5 10*3/uL (ref 0.7–4.0)
MCHC: 31.8 g/dL (ref 30.0–36.0)
MCV: 85.1 fl (ref 78.0–100.0)
Monocytes Absolute: 0.4 10*3/uL (ref 0.1–1.0)
Monocytes Relative: 9 % (ref 3.0–12.0)
Neutro Abs: 2 10*3/uL (ref 1.4–7.7)
Neutrophils Relative %: 51.1 % (ref 43.0–77.0)
Platelets: 244 10*3/uL (ref 150.0–400.0)
RBC: 4.73 Mil/uL (ref 3.87–5.11)
RDW: 14.9 % (ref 11.5–15.5)
WBC: 3.9 10*3/uL — ABNORMAL LOW (ref 4.0–10.5)

## 2021-03-02 LAB — POC URINALSYSI DIPSTICK (AUTOMATED)
Bilirubin, UA: NEGATIVE
Blood, UA: NEGATIVE
Glucose, UA: NEGATIVE
Ketones, UA: NEGATIVE
Leukocytes, UA: NEGATIVE
Nitrite, UA: NEGATIVE
Protein, UA: NEGATIVE
Spec Grav, UA: 1.02 (ref 1.010–1.025)
Urobilinogen, UA: 0.2 E.U./dL
pH, UA: 6 (ref 5.0–8.0)

## 2021-03-02 LAB — BASIC METABOLIC PANEL
BUN: 14 mg/dL (ref 6–23)
CO2: 31 mEq/L (ref 19–32)
Calcium: 10 mg/dL (ref 8.4–10.5)
Chloride: 106 mEq/L (ref 96–112)
Creatinine, Ser: 0.76 mg/dL (ref 0.40–1.20)
GFR: 87.84 mL/min (ref >60.00)
Glucose, Bld: 99 mg/dL (ref 70–99)
Potassium: 4.5 mEq/L (ref 3.5–5.1)
Sodium: 140 mEq/L (ref 135–145)

## 2021-03-02 LAB — POCT I-STAT CREATININE: Creatinine, Ser: 0.8 mg/dL (ref 0.44–1.00)

## 2021-03-02 LAB — LIPASE: Lipase: 19 U/L (ref 11.0–59.0)

## 2021-03-02 MED ORDER — AMOXICILLIN-POT CLAVULANATE 875-125 MG PO TABS: 1.0000 | ORAL_TABLET | Freq: Two times a day (BID) | ORAL | 0 refills | Status: DC

## 2021-03-02 MED ORDER — IOHEXOL 300 MG/ML  SOLN
100.0000 mL | Freq: Once | INTRAMUSCULAR | Status: AC | PRN
  Administered 2021-03-02: 100 mL via INTRAVENOUS

## 2021-03-02 NOTE — Progress Notes (Signed)
Subjective:    Patient ID: Ashley [name], female    DOB: [birth year], 56 y.o.   MRN: [id number]  HPI  Ashley [name] is a very pleasant 56 y.o. female with a history of TIA, hypertension, GERD, neuropathy, pelvic pain, hyperlipidemia, cholecystectomy, abdominal pain who presents today to discuss abdominal pain.  She was last evaluated by me on 01/16/21 for a one month history of generalized abdominal pain which moves from the bilateral upper, to mid or lower regions with occasional radiation so sides and pelvis. Also with constipation but hadn't taken anything OTC.   During this visit she underwent abdominal xray which shoed scattered gas, no constipation. Given family history of ovarian cancer we ordered a pelvic ultrasound which is scheduled for next week. She was also advised to try Miralax, Colace, water, exercise, high fiber diet.   Today she continues to notice abdominal pain which is generalized, moves around to various  locations. More so now located to periumbilical region and RLQ. Also with right lower back pain that occurs without her abdominal. She describes her abdominal pain as constant and dull, sometimes sharp. Also with gas and bloating.   She did have an episode of bilateral lower abdominal pain this week that work her from sleep and also noticed rectal burning.   She has noticed urinary frequency for the last 6 weeks, sometimes darker yellow. No hematuria, vaginal itching.   She denies constipation now, this has resolved with improved eating habits and one laxative pill.   She denies fevers, nausea, vomiting, rectal bleeding, unexplained weight loss, worsening pain with eating, diarrhea. She does not have a gall bladder. She does have diverticulosis to left colon on colonoscopy.    Review of Systems  Constitutional:  Negative for chills and fever.  Gastrointestinal:  Positive for abdominal pain. Negative for blood in stool, constipation, diarrhea, nausea  and vomiting.  Genitourinary:  Positive for frequency.        Past Medical History:  Diagnosis Date   Anemia    Bronchitis    2 years ago    [place] arthritis 2019   base of left thumb    GERD (gastroesophageal reflux disease)    diet controlled , no meds   Hydrosalpinx    bilateral    Hyperlipidemia    recent dx - currently diet control - no meds   Hypothyroid 2003   radio active iodine - no meds   Left-sided weakness 02/12/2019   Ovarian cyst, left 02/23/2016   SVD (spontaneous vaginal delivery)    x 1   Urinary frequency 11/30/2018   Vitamin B 12 deficiency     Social History   Socioeconomic History   Marital status: Single    Spouse name: Not on file   Number of children: Not on file   Years of education: Not on file   Highest education level: Not on file  Occupational History   Not on file  Tobacco Use   Smoking status: Never   Smokeless tobacco: Never  Vaping Use   Vaping Use: Never used  Substance and Sexual Activity   Alcohol use: No   Drug use: No   Sexual activity: Yes    Partners: Male    Birth control/protection: Surgical    Comment: hysterectomy.  Other Topics Concern   Not on file  Social History Narrative   Single.   3 children.   Works at [employer] as a Designer, multimedia.   Enjoys relaxing, exercise, walking her  dog.    Social Determinants of Health   Financial Resource Strain: Not on file  Food Insecurity: Not on file  Transportation Needs: Not on file  Physical Activity: Not on file  Stress: Not on file  Social Connections: Not on file  Intimate Partner Violence: Not on file    Past Surgical History:  Procedure Laterality Date   CESAREAN SECTION     x 2   CHOLECYSTECTOMY  2008   DILATION AND CURETTAGE OF UTERUS     X 2   LAPAROSCOPIC BILATERAL SALPINGECTOMY Bilateral 07/29/2017   Procedure: LAPAROSCOPIC LEFT SALPINGOOPHRECTOMY: Partial right Salpingectomy;  Surgeon: Lavonia Drafts, MD;  Location: [facility];  Service:  Gynecology;  Laterality: Bilateral;   SHOULDER SURGERY  2016   arthroscopic   TOTAL ABDOMINAL HYSTERECTOMY  2006   Fibroids, benign pathology; partial hysterectomy per patient    TUBAL LIGATION     WISDOM TOOTH EXTRACTION     at age 89 yr   WRIST ARTHROSCOPY Right 2007    Family History  Problem Relation Age of Onset   Hypertension Mother    Other Father        unknown medical history   Hypertension Maternal Grandmother    Heart failure Maternal Grandmother    Lung cancer Maternal Grandmother    Breast cancer Maternal Aunt    Colon cancer Neg Hx    Colon polyps Neg Hx    Rectal cancer Neg Hx    Stomach cancer Neg Hx     Allergies  Allergen Reactions   Levaquin [Levofloxacin]     Presumed cause of joint aches    Current Outpatient Medications on File Prior to Visit  Medication Sig Dispense Refill   amLODipine (NORVASC) 5 MG tablet Take 1 tablet (5 mg total) by mouth daily. For blood pressure. 90 tablet 1   aspirin 81 MG EC tablet Take by mouth.     atorvastatin (LIPITOR) 20 MG tablet Take 1 tablet (20 mg total) by mouth daily. For cholesterol. 90 tablet 1   azelastine (ASTELIN) 0.1 % nasal spray Place into the nose.     famotidine (PEPCID) 20 MG tablet Take 1 tablet (20 mg total) by mouth daily.     fluticasone (FLONASE) 50 MCG/ACT nasal spray Place 1 spray into both nostrils 2 (two) times daily. 16 g 2   gabapentin (NEURONTIN) 600 MG tablet Take 1,200 mg by mouth daily.     DULoxetine (CYMBALTA) 20 MG capsule Take Cymbalta 20 mg once daily for one week, then increase to 20 mg twice daily. (Patient not taking: Reported on 01/16/2021)     meclizine (ANTIVERT) 25 MG tablet Take 25 mg by mouth 3 (three) times daily as needed. (Patient not taking: Reported on 03/02/2021)     No current facility-administered medications on file prior to visit.    BP 124/72    Pulse 85    Temp 97.6 F (36.4 C) (Temporal)    Ht 5\' 6"  (1.676 m)    Wt 182 lb (82.6 kg)    SpO2 99%    BMI 29.38  kg/m  Objective:   Physical Exam Constitutional:      General: She is not in acute distress.    Appearance: She is not ill-appearing.  Abdominal:     Tenderness: There is abdominal tenderness in the right upper quadrant, right lower quadrant and periumbilical area. There is guarding. There is no right CVA tenderness or left CVA tenderness.  Assessment & Plan:      This visit occurred during the SARS-CoV-2 public health emergency.  Safety protocols were in place, including screening questions prior to the visit, additional usage of staff PPE, and extensive cleaning of exam room while observing appropriate contact time as indicated for disinfecting solutions.

## 2021-03-02 NOTE — Telephone Encounter (Signed)
Spoke to patient before message was received. She has been seen in the office. No further action needed.

## 2021-03-02 NOTE — Telephone Encounter (Signed)
[facility] Night - Client Nonclinical Telephone Record  [facility] Client [facility] Night - Client Client Site [place] - Night Provider Alma Friendly - NP Contact Type Call Who Is Calling Patient / Member / Family / Caregiver Caller Name Woodruff Phone Number [phone] Patient Name Ashley [name] Patient DOB Oct 30, 1965 Call Type Message Only Information Provided Reason for Call Request to [place] Appointment Initial Comment Caller states she is at the location for her appt and the office is not open. Patient request to speak to RN No Disp. Time Disposition Final User 03/02/2021 7:34:31 AM General Information Provided Yes Paulita Cradle Call Closed By: Paulita Cradle Transaction Date/Time: 03/02/2021 7:32:10 AM (ET   Per appt notes pt has been arrived for 03/02/21 appt. Sending note to Gentry Fitz NP and [facility] CMA.

## 2021-03-02 NOTE — Patient Instructions (Signed)
Stop by the lab prior to leaving today. I will notify you of your results once received.   You will be contacted regarding your CT scan.  Please call me at 12 pm if you've not received a phone call about this.  You will be contacted regarding your referral to GI.  Please let us know if you have not been contacted within two weeks.   Do not eat or drink anything.   It was a pleasure to see you today!

## 2021-03-02 NOTE — Assessment & Plan Note (Addendum)
Progressing, very tender with guarding to RLQ.  Need to rule out appendicitis as symptoms have been progressing.  Will obtain labs and Stat CT abdomen/pelvis for further evaluation.   UA today negative.   She does appear stable for outpatient treatment.  Referral placed to GI

## 2021-03-06 ENCOUNTER — Ambulatory Visit: Payer: Managed Care, Other (non HMO)

## 2021-03-13 DIAGNOSIS — I10 Essential (primary) hypertension: Secondary | ICD-10-CM

## 2021-03-13 MED ORDER — AMLODIPINE BESYLATE 5 MG PO TABS: 5.0000 mg | ORAL_TABLET | Freq: Every day | ORAL | 0 refills | Status: DC

## 2021-03-26 ENCOUNTER — Ambulatory Visit
Admission: EM | Admit: 2021-03-26 | Discharge: 2021-03-26 | Disposition: A | Discharge status: Home / Self Care | Payer: Managed Care, Other (non HMO) | Attending: Physician Assistant | Admitting: Physician Assistant

## 2021-03-26 ENCOUNTER — Encounter: Payer: Self-pay | Admitting: Emergency Medicine

## 2021-03-26 ENCOUNTER — Other Ambulatory Visit: Payer: Self-pay

## 2021-03-26 DIAGNOSIS — N390 Urinary tract infection, site not specified: Secondary | ICD-10-CM | POA: Diagnosis not present

## 2021-03-26 DIAGNOSIS — R1011 Right upper quadrant pain: Secondary | ICD-10-CM | POA: Insufficient documentation

## 2021-03-26 LAB — POCT URINALYSIS DIP (MANUAL ENTRY)
Bilirubin, UA: NEGATIVE
Glucose, UA: NEGATIVE mg/dL
Ketones, POC UA: NEGATIVE mg/dL
Nitrite, UA: POSITIVE — AB
Protein Ur, POC: NEGATIVE mg/dL
Spec Grav, UA: <=1.005 — AB (ref 1.010–1.025)
Urobilinogen, UA: 0.2 E.U./dL
pH, UA: 5.5 (ref 5.0–8.0)

## 2021-03-26 MED ORDER — CEPHALEXIN 500 MG PO CAPS: 500.0000 mg | ORAL_CAPSULE | Freq: Four times a day (QID) | ORAL | 0 refills | Status: AC

## 2021-03-26 NOTE — ED Triage Notes (Signed)
Pt here with increased urinary frequency and suprapubic pain and pressure x 2 days. Denies discharge.

## 2021-03-28 LAB — URINE CULTURE: Culture: >=100000 — AB

## 2021-03-29 ENCOUNTER — Emergency Department
Admission: EM | Admit: 2021-03-29 | Discharge: 2021-03-29 | Disposition: A | Discharge status: Home / Self Care | Payer: Managed Care, Other (non HMO) | Attending: Emergency Medicine | Admitting: Emergency Medicine

## 2021-03-29 ENCOUNTER — Ambulatory Visit: Payer: Managed Care, Other (non HMO) | Admitting: Family Medicine

## 2021-03-29 ENCOUNTER — Other Ambulatory Visit: Payer: Self-pay

## 2021-03-29 ENCOUNTER — Emergency Department: Payer: Managed Care, Other (non HMO)

## 2021-03-29 ENCOUNTER — Encounter: Payer: Self-pay | Admitting: Emergency Medicine

## 2021-03-29 DIAGNOSIS — R1031 Right lower quadrant pain: Secondary | ICD-10-CM

## 2021-03-29 DIAGNOSIS — I10 Essential (primary) hypertension: Secondary | ICD-10-CM | POA: Insufficient documentation

## 2021-03-29 DIAGNOSIS — K5730 Diverticulosis of large intestine without perforation or abscess without bleeding: Secondary | ICD-10-CM | POA: Diagnosis not present

## 2021-03-29 LAB — URINALYSIS, ROUTINE W REFLEX MICROSCOPIC
Bilirubin Urine: NEGATIVE
Glucose, UA: NEGATIVE mg/dL
Hgb urine dipstick: NEGATIVE
Ketones, ur: NEGATIVE mg/dL
Leukocytes,Ua: NEGATIVE
Nitrite: NEGATIVE
Protein, ur: NEGATIVE mg/dL
Specific Gravity, Urine: <1.005 — ABNORMAL LOW (ref 1.005–1.030)
pH: 5 (ref 5.0–8.0)

## 2021-03-29 LAB — COMPREHENSIVE METABOLIC PANEL
ALT: 14 U/L (ref 0–44)
AST: 24 U/L (ref 15–41)
Albumin: 4.3 g/dL (ref 3.5–5.0)
Alkaline Phosphatase: 87 U/L (ref 38–126)
Anion gap: 6 (ref 5–15)
BUN: 11 mg/dL (ref 6–20)
CO2: 26 mmol/L (ref 22–32)
Calcium: 9.9 mg/dL (ref 8.9–10.3)
Chloride: 107 mmol/L (ref 98–111)
Creatinine, Ser: 0.78 mg/dL (ref 0.44–1.00)
GFR, Estimated: >60 mL/min (ref >60)
Glucose, Bld: 105 mg/dL — ABNORMAL HIGH (ref 70–99)
Potassium: 4.3 mmol/L (ref 3.5–5.1)
Sodium: 139 mmol/L (ref 135–145)
Total Bilirubin: 0.4 mg/dL (ref 0.3–1.2)
Total Protein: 7.8 g/dL (ref 6.5–8.1)

## 2021-03-29 LAB — CBC
HCT: 41.7 % (ref 36.0–46.0)
Hemoglobin: 13.2 g/dL (ref 12.0–15.0)
MCH: 27.4 pg (ref 26.0–34.0)
MCHC: 31.7 g/dL (ref 30.0–36.0)
MCV: 86.5 fL (ref 80.0–100.0)
Platelets: 244 10*3/uL (ref 150–400)
RBC: 4.82 MIL/uL (ref 3.87–5.11)
RDW: 15.1 % (ref 11.5–15.5)
WBC: 4.4 10*3/uL (ref 4.0–10.5)
nRBC: 0 % (ref 0.0–0.2)

## 2021-03-29 LAB — LIPASE, BLOOD: Lipase: 34 U/L (ref 11–51)

## 2021-03-29 MED ORDER — HYDROCODONE-ACETAMINOPHEN 5-325 MG PO TABS
1.0000 | ORAL_TABLET | Freq: Once | ORAL | Status: DC
  Filled 2021-03-29: qty 1

## 2021-03-29 MED ORDER — IOHEXOL 300 MG/ML  SOLN
100.0000 mL | Freq: Once | INTRAMUSCULAR | Status: AC | PRN
  Administered 2021-03-29: 100 mL via INTRAVENOUS
  Filled 2021-03-29: qty 100

## 2021-03-29 MED ORDER — ACETAMINOPHEN 500 MG PO TABS
1000.0000 mg | ORAL_TABLET | Freq: Once | ORAL | Status: AC
  Administered 2021-03-29: 1000 mg via ORAL
  Filled 2021-03-29: qty 2

## 2021-03-29 NOTE — ED Notes (Signed)
See triage note  presents with some abd pain  states she has had this pain for several weeks  was seen by PCP   had Ct of abd done  placed on antibiotics  states she felt better   then the pain returned    states pain is bilateral abd  mainly on the sides

## 2021-03-29 NOTE — ED Provider Notes (Signed)
[name]    CSN: [id number] Arrival date & time: 03/26/21  1706      History   Chief Complaint Chief Complaint  Patient presents with   Dysuria   Abdominal Pain    HPI Ashley [name] is a 56 y.o. female.   The history is provided by the patient. No language interpreter was used.  Dysuria Pain quality:  Aching Pain severity:  Moderate Onset quality:  Gradual Duration:  2 days Timing:  Constant Progression:  Worsening Chronicity:  New Associated symptoms: abdominal pain   Abdominal Pain Associated symptoms: dysuria    Past Medical History:  Diagnosis Date   Anemia    Bronchitis    2 years ago    [place] arthritis 2019   base of left thumb    GERD (gastroesophageal reflux disease)    diet controlled , no meds   Hydrosalpinx    bilateral    Hyperlipidemia    recent dx - currently diet control - no meds   Hypertension    Hypothyroid 2003   radio active iodine - no meds   Left-sided weakness 02/12/2019   Ovarian cyst, left 02/23/2016   SVD (spontaneous vaginal delivery)    x 1   Urinary frequency 11/30/2018   Vitamin B 12 deficiency     Patient Active Problem List   Diagnosis Date Noted   Cough 10/04/2020   Palpitations 10/04/2020   Prediabetes 06/15/2020   Chronic hand pain 05/10/2020   Vaginal discharge 12/29/2019   Neuropathy 12/13/2019   Lumbar stenosis without neurogenic claudication 12/09/2019   Left ear pain 11/19/2019   Cervical radiculitis 11/19/2019   Foraminal stenosis of cervical region 11/19/2019   Vertigo 10/07/2019   Acute midline low back pain without sciatica 09/10/2019   Joint pain 09/01/2019   Adverse effect of drug 08/15/2019   HSV (herpes simplex virus) anogenital infection 06/25/2019   Preventative health care 06/23/2019   Numbness 04/22/2019   Constipation 04/07/2019   Neck pain 02/17/2019   HTN (hypertension), malignant    TIA (transient ischemic attack)    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   Numbness  and tingling 02/01/2019   Urinary frequency 11/30/2018   Ganglion cyst of tendon sheath of left hand 11/13/2018   Acquired trigger finger 10/26/2018   Osteoarthritis 06/30/2018   Vitamin D deficiency 06/23/2018   Vitamin B 12 deficiency 06/23/2018   Abdominal pain 06/23/2018   GERD (gastroesophageal reflux disease) 01/30/2018   Hyperlipidemia 03/19/2017   Allergic rhinitis 03/19/2017   Pelvic pain 02/07/2016   S/P shoulder surgery 02/28/2015   Impingement syndrome of left shoulder 12/27/2014   Impingement syndrome of right shoulder 12/27/2014   Chronic right shoulder pain 11/29/2014    Past Surgical History:  Procedure Laterality Date   CESAREAN SECTION     x 2   CHOLECYSTECTOMY  2008   DILATION AND CURETTAGE OF UTERUS     X 2   LAPAROSCOPIC BILATERAL SALPINGECTOMY Bilateral 07/29/2017   Procedure: LAPAROSCOPIC LEFT SALPINGOOPHRECTOMY: Partial right Salpingectomy;  Surgeon: Lavonia Drafts, MD;  Location: [facility];  Service: Gynecology;  Laterality: Bilateral;   SHOULDER SURGERY  2016   arthroscopic   TOTAL ABDOMINAL HYSTERECTOMY  2006   Fibroids, benign pathology; partial hysterectomy per patient    TUBAL LIGATION     WISDOM TOOTH EXTRACTION     at age 59 yr   WRIST ARTHROSCOPY Right 2007    OB History     Gravida  5  Para  3   Term  3   Preterm  0   AB  2   Living  3      SAB  2   IAB  0   Ectopic  0   Multiple  0   Live Births  3            Home Medications    Prior to Admission medications   Medication Sig Start Date End Date Taking? Authorizing Provider  cephALEXin (KEFLEX) 500 MG capsule Take 1 capsule (500 mg total) by mouth 4 (four) times daily for 10 days. 03/26/21 04/05/21 Yes Caryl Ada K, PA-C  amLODipine (NORVASC) 5 MG tablet Take 1 tablet (5 mg total) by mouth daily. For blood pressure. 03/13/21   Pleas Koch, NP  aspirin 81 MG EC tablet Take by mouth. 09/16/19   [provider]   atorvastatin (LIPITOR) 20 MG tablet Take 1 tablet (20 mg total) by mouth daily. For cholesterol. 10/25/20   Pleas Koch, NP  azelastine (ASTELIN) 0.1 % nasal spray Place into the nose. 03/11/19   [provider]  DULoxetine (CYMBALTA) 20 MG capsule Take Cymbalta 20 mg once daily for one week, then increase to 20 mg twice daily. Patient not taking: Reported on 01/16/2021 12/18/20   [provider]  famotidine (PEPCID) 20 MG tablet Take 1 tablet (20 mg total) by mouth daily. 10/03/20   Tonia Ghent, MD  fluticasone (FLONASE) 50 MCG/ACT nasal spray Place 1 spray into both nostrils 2 (two) times daily. 11/19/19   Tower, Wynelle Fanny, MD  gabapentin (NEURONTIN) 600 MG tablet Take 1,200 mg by mouth daily. 01/25/20   [provider]  meclizine (ANTIVERT) 25 MG tablet Take 25 mg by mouth 3 (three) times daily as needed. Patient not taking: Reported on 03/02/2021    [provider]    Family History Family History  Problem Relation Age of Onset   Hypertension Mother    Other Father        unknown medical history   Hypertension Maternal Grandmother    Heart failure Maternal Grandmother    Lung cancer Maternal Grandmother    Breast cancer Maternal Aunt    Colon cancer Neg Hx    Colon polyps Neg Hx    Rectal cancer Neg Hx    Stomach cancer Neg Hx     Social History Social History   Tobacco Use   Smoking status: Never   Smokeless tobacco: Never  Vaping Use   Vaping Use: Never used  Substance Use Topics   Alcohol use: No   Drug use: No     Allergies   Levaquin [levofloxacin]   Review of Systems Review of Systems  Gastrointestinal:  Positive for abdominal pain.  Genitourinary:  Positive for dysuria.  All other systems reviewed and are negative.   Physical Exam Triage Vital Signs ED Triage Vitals  Enc Vitals Group     BP 03/26/21 1757 (!) 156/84     Pulse Rate 03/26/21 1757 89     Resp 03/26/21 1757 16     Temp 03/26/21 1757 98.7 F  (37.1 C)     Temp Source 03/26/21 1757 Oral     SpO2 03/26/21 1757 97 %     Weight --      Height --      Head Circumference --      Peak Flow --      Pain Score 03/26/21 1801 3  Pain Loc --      Pain Edu? --      Excl. in [place]? --    No data found.  Updated Vital Signs BP (!) 156/84 (BP Location: Left Arm)    Pulse 89    Temp 98.7 F (37.1 C) (Oral)    Resp 16    SpO2 97%   Visual Acuity Right Eye Distance:   Left Eye Distance:   Bilateral Distance:    Right Eye Near:   Left Eye Near:    Bilateral Near:     Physical Exam Vitals and nursing note reviewed.  Constitutional:      Appearance: She is well-developed.  HENT:     Head: Normocephalic.  Cardiovascular:     Rate and Rhythm: Normal rate.  Pulmonary:     Effort: Pulmonary effort is normal.  Abdominal:     General: Abdomen is flat. There is no distension.     Palpations: Abdomen is soft.     Tenderness: There is abdominal tenderness in the suprapubic area.  Musculoskeletal:        General: Normal range of motion.     Cervical back: Normal range of motion.  Skin:    General: Skin is warm.  Neurological:     Mental Status: She is alert and oriented to person, place, and time.     UC Treatments / Results  Labs (all labs ordered are listed, but only abnormal results are displayed) Labs Reviewed  URINE CULTURE - Abnormal; Notable for the following components:      Result Value   Culture >=100,000 COLONIES/mL ESCHERICHIA COLI (*)    Organism ID, Bacteria ESCHERICHIA COLI (*)    All other components within normal limits  POCT URINALYSIS DIP (MANUAL ENTRY) - Abnormal; Notable for the following components:   Spec Grav, UA <=1.005 (*)    Blood, UA trace-intact (*)    Nitrite, UA Positive (*)    Leukocytes, UA Small (1+) (*)    All other components within normal limits    EKG   Radiology CT Abdomen Pelvis W Contrast  Result Date: 03/29/2021 CLINICAL DATA:  Abdominal pain, history of colitis EXAM: CT  ABDOMEN AND PELVIS WITH CONTRAST TECHNIQUE: Multidetector CT imaging of the abdomen and pelvis was performed using the standard protocol following bolus administration of intravenous contrast. RADIATION DOSE REDUCTION: This exam was performed according to the departmental dose-optimization program which includes automated exposure control, adjustment of the mA and/or kV according to patient size and/or use of iterative reconstruction technique. CONTRAST:  139mL OMNIPAQUE IOHEXOL 300 MG/ML  SOLN COMPARISON:  03/02/2021 FINDINGS: Lower chest: No acute pleural or parenchymal lung disease. Hepatobiliary: No focal liver abnormality is seen. Status post cholecystectomy. No biliary dilatation. Pancreas: Unremarkable. No pancreatic ductal dilatation or surrounding inflammatory changes. Spleen: Normal in size without focal abnormality. Adrenals/Urinary Tract: Adrenal glands are unremarkable. Kidneys are normal, without renal calculi, focal lesion, or hydronephrosis. Bladder is decompressed without gross abnormality. Stomach/Bowel: No bowel obstruction or ileus. Normal appendix right lower quadrant. Scattered colonic diverticulosis without evidence of diverticulitis. No bowel wall thickening or inflammatory change. Vascular/Lymphatic: Aortic atherosclerosis. No enlarged abdominal or pelvic lymph nodes. Reproductive: Status post hysterectomy. No adnexal masses. Other: No free fluid or free intraperitoneal gas. Small fat containing umbilical hernia. No bowel herniation. Musculoskeletal: No acute or destructive bony lesions. Reconstructed images demonstrate no additional findings. IMPRESSION: 1. No acute intra-abdominal or intrapelvic process. 2. Scattered colonic diverticulosis without diverticulitis. Electronically Signed   By: Legrand Como  Owens Shark M.D.   On: 03/29/2021 18:46    Procedures Procedures (including critical care time)  Medications Ordered in UC Medications - No data to display  Initial Impression / Assessment  and Plan / UC Course  I have reviewed the triage vital signs and the nursing notes.  Pertinent labs & imaging results that were available during my care of the patient were reviewed by me and considered in my medical decision making (see chart for details).      Final Clinical Impressions(s) / UC Diagnoses   Final diagnoses:  Right upper quadrant abdominal pain  Urinary tract infection without hematuria, site unspecified   Discharge Instructions   None    ED Prescriptions     Medication Sig Dispense Auth. Provider   cephALEXin (KEFLEX) 500 MG capsule Take 1 capsule (500 mg total) by mouth 4 (four) times daily for 10 days. 28 capsule Fransico Meadow, [place]      PDMP not reviewed this encounter.   Fransico Meadow, [place] 03/29/21 1906

## 2021-03-29 NOTE — ED Triage Notes (Signed)
Pt states that she had a ct scan performed a couple weeks ago, states that she was treated with antibiotics and states that she felt some better for a week but it slowly came back, pt denies vomiting or diarrhea

## 2021-03-29 NOTE — Discharge Instructions (Addendum)
-  Follow-up with your gastroenterologist as discussed. -Return to the emergency department at any time if you begin to experience any new or worsening symptoms.

## 2021-03-29 NOTE — ED Provider Notes (Signed)
[facility] Provider Note    Event Date/Time   First MD Initiated Contact with Patient 03/29/21 1716     (approximate)   History   Chief Complaint Abdominal Pain   HPI Ashley [name] is a 56 y.o. female, history of hypertension, TIA, GERD, hyperlipidemia, presents to the emergency department for evaluation of abdominal pain.  Patient states that she has been experiencing intermittent abdominal pain for the past few months.  She was recently evaluated for this in this emergency department on 03/02/2021, where a CT scan showed evidence of possible colitis, as well as diverticulosis.  She was placed on antibiotics following this visit.  She states that her pain was reduced significantly following the antibiotics, however she states that since being off of the antibiotics, her pain has returned.  She currently rates at a 7/10. In addition, her pain is less centralized and more localized towards the right upper/lower quadrant.  Denies fever/chills, chest pain, shortness of breath, back pain, urinary symptoms, vaginal bleeding, or headaches.  History Limitations: No limitations.      Physical Exam  Triage Vital Signs: ED Triage Vitals  Enc Vitals Group     BP 03/29/21 1555 (!) 157/96     Pulse Rate 03/29/21 1555 87     Resp 03/29/21 1555 16     Temp 03/29/21 1555 98.6 F (37 C)     Temp Source 03/29/21 1555 Oral     SpO2 03/29/21 1555 99 %     Weight 03/29/21 1550 181 lb (82.1 kg)     Height 03/29/21 1550 5\' 6"  (1.676 m)     Head Circumference --      Peak Flow --      Pain Score 03/29/21 1550 6     Pain Loc --      Pain Edu? --      Excl. in [place]? --     Most recent vital signs: Vitals:   03/29/21 1555 03/29/21 1747  BP: (!) 157/96 (!) 154/95  Pulse: 87 75  Resp: 16 17  Temp: 98.6 F (37 C)   SpO2: 99% 98%    General: Awake, NAD.  CV: Good peripheral perfusion.  Resp: Normal effort.  Lung sounds clear bilaterally. Abd: Soft,  nondistended.  Moderate tenderness appreciated in the right lower quadrant and right upper quadrant. Neuro: At baseline. No gross neurological deficits. Other: No CVA tenderness  Physical Exam    ED Results / Procedures / Treatments  Labs (all labs ordered are listed, but only abnormal results are displayed) Labs Reviewed  COMPREHENSIVE METABOLIC PANEL - Abnormal; Notable for the following components:      Result Value   Glucose, Bld 105 (*)    All other components within normal limits  URINALYSIS, ROUTINE W REFLEX MICROSCOPIC - Abnormal; Notable for the following components:   Specific Gravity, Urine <1.005 (*)    All other components within normal limits  LIPASE, BLOOD  CBC  POC URINE PREG, ED     EKG Not applicable.   RADIOLOGY  ED Provider Interpretation: I personally reviewed this image.  No acute findings.  CT Abdomen Pelvis W Contrast  Result Date: 03/29/2021 CLINICAL DATA:  Abdominal pain, history of colitis EXAM: CT ABDOMEN AND PELVIS WITH CONTRAST TECHNIQUE: Multidetector CT imaging of the abdomen and pelvis was performed using the standard protocol following bolus administration of intravenous contrast. RADIATION DOSE REDUCTION: This exam was performed according to the departmental dose-optimization program which includes automated exposure  control, adjustment of the mA and/or kV according to patient size and/or use of iterative reconstruction technique. CONTRAST:  154mL OMNIPAQUE IOHEXOL 300 MG/ML  SOLN COMPARISON:  03/02/2021 FINDINGS: Lower chest: No acute pleural or parenchymal lung disease. Hepatobiliary: No focal liver abnormality is seen. Status post cholecystectomy. No biliary dilatation. Pancreas: Unremarkable. No pancreatic ductal dilatation or surrounding inflammatory changes. Spleen: Normal in size without focal abnormality. Adrenals/Urinary Tract: Adrenal glands are unremarkable. Kidneys are normal, without renal calculi, focal lesion, or hydronephrosis.  Bladder is decompressed without gross abnormality. Stomach/Bowel: No bowel obstruction or ileus. Normal appendix right lower quadrant. Scattered colonic diverticulosis without evidence of diverticulitis. No bowel wall thickening or inflammatory change. Vascular/Lymphatic: Aortic atherosclerosis. No enlarged abdominal or pelvic lymph nodes. Reproductive: Status post hysterectomy. No adnexal masses. Other: No free fluid or free intraperitoneal gas. Small fat containing umbilical hernia. No bowel herniation. Musculoskeletal: No acute or destructive bony lesions. Reconstructed images demonstrate no additional findings. IMPRESSION: 1. No acute intra-abdominal or intrapelvic process. 2. Scattered colonic diverticulosis without diverticulitis. Electronically Signed   By: Randa Ngo M.D.   On: 03/29/2021 18:46    PROCEDURES:  Critical Care performed: None.  Procedures    MEDICATIONS ORDERED IN ED: Medications  HYDROcodone-acetaminophen (NORCO/VICODIN) 5-325 MG per tablet 1 tablet (1 tablet Oral Not Given 03/29/21 1735)  acetaminophen (TYLENOL) tablet 1,000 mg (1,000 mg Oral Given 03/29/21 1745)  iohexol (OMNIPAQUE) 300 MG/ML solution 100 mL (100 mLs Intravenous Contrast Given 03/29/21 1827)     IMPRESSION / MDM / ASSESSMENT AND PLAN / ED COURSE  I reviewed the triage vital signs and the nursing notes.                              Ashley [name] is a 56 y.o. female, history of hypertension, TIA, GERD, hyperlipidemia, presents to the emergency department for evaluation of abdominal pain.  Patient states that she has been experiencing intermittent abdominal pain for the past few months.  She was recently evaluated for this in this emergency department on 03/02/2021, where a CT scan showed evidence of possible colitis, as well as diverticulosis.  She was placed on antibiotics following this visit.  She states that her pain was reduced significantly following the antibiotics, however she states that  since being off of the antibiotics, her pain has returned.  Differential diagnosis includes, but is not limited to, diverticulitis, colitis, appendicitis, cholecystitis, gastroenteritis, pancreatitis.   ED Course Patient appears well.  Vital signs are within normal limits.  Given her endorsement of pain, we will go ahead and treat with acetaminophen.  CBC unremarkable for leukocytosis or anemia.  CMP unremarkable for electrolyte abnormalities, transaminitis, or kidney injury.  Urinalysis shows no evidence of infection.  Lipase unremarkable at 34.  Unlikely pancreatitis.  Abdominal/pelvic CT shows diverticulosis, otherwise unremarkable for any acute pathology.  Assessment/Plan Patient is stable.  Abdominal CT unremarkable, though is notable for scattered colonic diverticulosis without infection. Labs are reassuring.  After reexamination, patient states that her pain has improved after Tylenol.  Low suspicion for any serious or life-threatening pathology at this point.  Patient has a appointment with her gastroenterologist her on 04/16/2021.  I believe she will be safe until then. We will plan to discharge this patient.  Patient was provided with anticipatory guidance, return precautions, and educational material. Encouraged the patient to return to the emergency department at any time if they begin to experience any new or worsening  symptoms.       FINAL CLINICAL IMPRESSION(S) / ED DIAGNOSES   Final diagnoses:  Right lower quadrant abdominal pain     Rx / DC Orders   ED Discharge Orders     None        Note:  This document was prepared using Dragon voice recognition software and may include unintentional dictation errors.   Teodoro Spray, [place] 03/29/21 1900    Carrie Mew, MD 03/30/21 [phone]

## 2021-03-29 NOTE — Telephone Encounter (Signed)
Noted. Pt went to ED.

## 2021-03-29 NOTE — Telephone Encounter (Signed)
I spoke with pt; pt saw Gentry Fitz NP on 03/02/21 for abd pain. Pt finished abx given and pt said abd pain completely went away. Pt said the pain is different this time; before was mid abd pain; now the pain in on rt side at waistline and below waistline but sometimes the pain shoots up to rt rib area. Pt had [place] of abd and pelvis; pt has new pt appt to see Dr Allen Norris GI on 04/16/21. Pt said today the pain is a dull pain that is consistent at a pain level of 6. Pt said she thinks something showed up on CT about her rt side. Pt has not had fever, no N&V and no diarrhea. Pt last normal BM was 03/28/21 and there was no blood seen. Pt said she is at work with rt side pain level of 6. Pt said she is going to leave work if can get appt. Pt does not think it is bad enough yet to go to ED. Pt scheduled in office appt with Dr Diona Browner 03/29/21 at 4 PM. If pt condition or pain level worsens pt is going to ED prior to appt. Pt said she is presently taking an abx for UTI when seen at [facility] but pt said this pain is different than UTI pain. Sending note to Dr Diona Browner and Butch Penny CMA.will also teams Butch Penny.

## 2021-03-31 ENCOUNTER — Encounter: Payer: Self-pay | Admitting: Radiology

## 2021-04-09 DIAGNOSIS — M189 Osteoarthritis of first carpometacarpal joint, unspecified: Secondary | ICD-10-CM | POA: Insufficient documentation

## 2021-04-09 DIAGNOSIS — M79644 Pain in right finger(s): Secondary | ICD-10-CM | POA: Insufficient documentation

## 2021-04-15 NOTE — Progress Notes (Signed)
Gastroenterology Consultation  Referring Provider:     Pleas Koch, NP Primary Care Physician:  Pleas Koch, NP Primary Gastroenterologist:  Dr. Allen Norris     Reason for Consultation:     Abdominal pain        HPI:   Ashley [name] is a 56 y.o. y/o female referred for consultation & management of Abdominal pain by Dr. Carlis Abbott, Leticia Penna, NP.  This patient comes in today after being seen in the past by Dr. Carlean Purl in [place] with a colonoscopy at that time showing an inflammatory polyp and a repeat colonoscopy recommended in 2029.  The patient was then seen by her primary care provider back in December 2021 and at that time requested a referral to gastroenterology at the [facility] clinic.  A referral was sent at that time for Dr. Haig Prophet.  It does not appear that the patient was seen at that time but now referred to see me today.  On February 2 of this year the patient was seen in the emergency department and at that time reported having intermittent abdominal pain for the last few months.  The patient was seen prior to that in January in the emergency department with a CT scan showing diverticulosis and possible colitis.  The patient was started on antibiotics at that time which helped resolve the pain but when she stopped the antibiotics the pain came back.  In the emergency department the patient was reporting the pain to be on the right side and the lower and upper abdomen.  The patient's repeat CT scan in February did not demonstrate the thickened colon at the hepatic flexure with adjacent transverse and ascending colon as was seen in January.  The patient's CBC, LFTs and lipase were all normal in the ED.  She has had chronic GERD but it seems worse. She is on Omeprazole now and it does not work.  Past Medical History:  Diagnosis Date   Anemia    Bronchitis    2 years ago    [place] arthritis 2019   base of left thumb    GERD (gastroesophageal reflux disease)    diet  controlled , no meds   Hydrosalpinx    bilateral    Hyperlipidemia    recent dx - currently diet control - no meds   Hypertension    Hypothyroid 2003   radio active iodine - no meds   Left-sided weakness 02/12/2019   Ovarian cyst, left 02/23/2016   SVD (spontaneous vaginal delivery)    x 1   Urinary frequency 11/30/2018   Vitamin B 12 deficiency     Past Surgical History:  Procedure Laterality Date   CESAREAN SECTION     x 2   CHOLECYSTECTOMY  2008   DILATION AND CURETTAGE OF UTERUS     X 2   LAPAROSCOPIC BILATERAL SALPINGECTOMY Bilateral 07/29/2017   Procedure: LAPAROSCOPIC LEFT SALPINGOOPHRECTOMY: Partial right Salpingectomy;  Surgeon: Lavonia Drafts, MD;  Location: [facility];  Service: Gynecology;  Laterality: Bilateral;   SHOULDER SURGERY  2016   arthroscopic   TOTAL ABDOMINAL HYSTERECTOMY  2006   Fibroids, benign pathology; partial hysterectomy per patient    TUBAL LIGATION     WISDOM TOOTH EXTRACTION     at age 63 yr   WRIST ARTHROSCOPY Right 2007    Prior to Admission medications   Medication Sig Start Date End Date Taking? Authorizing Provider  amLODipine (NORVASC) 5 MG tablet Take 1 tablet (5  mg total) by mouth daily. For blood pressure. 03/13/21   Pleas Koch, NP  aspirin 81 MG EC tablet Take by mouth. 09/16/19   [provider]  atorvastatin (LIPITOR) 20 MG tablet Take 1 tablet (20 mg total) by mouth daily. For cholesterol. 10/25/20   Pleas Koch, NP  azelastine (ASTELIN) 0.1 % nasal spray Place into the nose. 03/11/19   [provider]  DULoxetine (CYMBALTA) 20 MG capsule Take Cymbalta 20 mg once daily for one week, then increase to 20 mg twice daily. Patient not taking: Reported on 01/16/2021 12/18/20   [provider]  famotidine (PEPCID) 20 MG tablet Take 1 tablet (20 mg total) by mouth daily. 10/03/20   Tonia Ghent, MD  fluticasone (FLONASE) 50 MCG/ACT nasal spray Place 1 spray into both  nostrils 2 (two) times daily. 11/19/19   Tower, Wynelle Fanny, MD  gabapentin (NEURONTIN) 600 MG tablet Take 1,200 mg by mouth daily. 01/25/20   [provider]  meclizine (ANTIVERT) 25 MG tablet Take 25 mg by mouth 3 (three) times daily as needed. Patient not taking: Reported on 03/02/2021    [provider]    Family History  Problem Relation Age of Onset   Hypertension Mother    Other Father        unknown medical history   Hypertension Maternal Grandmother    Heart failure Maternal Grandmother    Lung cancer Maternal Grandmother    Breast cancer Maternal Aunt    Colon cancer Neg Hx    Colon polyps Neg Hx    Rectal cancer Neg Hx    Stomach cancer Neg Hx      Social History   Tobacco Use   Smoking status: Never   Smokeless tobacco: Never  Vaping Use   Vaping Use: Never used  Substance Use Topics   Alcohol use: No   Drug use: No    Allergies as of 04/16/2021 - Review Complete 03/29/2021  Allergen Reaction Noted   Levaquin [levofloxacin]  08/12/2019    Review of Systems:    All systems reviewed and negative except where noted in HPI.   Physical Exam:  There were no vitals taken for this visit. No LMP recorded. Patient has had a hysterectomy. General:   Alert,  Well-developed, well-nourished, pleasant and cooperative in NAD Head:  Normocephalic and atraumatic. Eyes:  Sclera clear, no icterus.   Conjunctiva pink. Ears:  Normal auditory acuity. Neck:  Supple; no masses or thyromegaly. Lungs:  Respirations even and unlabored.  Clear throughout to auscultation.   No wheezes, crackles, or rhonchi. No acute distress. Heart:  Regular rate and rhythm; no murmurs, clicks, rubs, or gallops. Abdomen:  Normal bowel sounds.  No bruits.  Soft, positive tenderness to 1 finger palpation while flexing the abdominal wall muscles and non-distended without masses, hepatosplenomegaly or hernias noted.  No guarding or rebound tenderness.  Positive Carnett sign.   Rectal:   Deferred.  Pulses:  Normal pulses noted. Extremities:  No clubbing or edema.  No cyanosis. Neurologic:  Alert and oriented x3;  grossly normal neurologically. Skin:  Intact without significant lesions or rashes.  No jaundice. Lymph Nodes:  No significant cervical adenopathy. Psych:  Alert and cooperative. Normal mood and affect.  Imaging Studies: CT Abdomen Pelvis W Contrast  Result Date: 03/29/2021 CLINICAL DATA:  Abdominal pain, history of colitis EXAM: CT ABDOMEN AND PELVIS WITH CONTRAST TECHNIQUE: Multidetector CT imaging of the abdomen and pelvis was performed using the standard protocol  following bolus administration of intravenous contrast. RADIATION DOSE REDUCTION: This exam was performed according to the departmental dose-optimization program which includes automated exposure control, adjustment of the mA and/or kV according to patient size and/or use of iterative reconstruction technique. CONTRAST:  111mL OMNIPAQUE IOHEXOL 300 MG/ML  SOLN COMPARISON:  03/02/2021 FINDINGS: Lower chest: No acute pleural or parenchymal lung disease. Hepatobiliary: No focal liver abnormality is seen. Status post cholecystectomy. No biliary dilatation. Pancreas: Unremarkable. No pancreatic ductal dilatation or surrounding inflammatory changes. Spleen: Normal in size without focal abnormality. Adrenals/Urinary Tract: Adrenal glands are unremarkable. Kidneys are normal, without renal calculi, focal lesion, or hydronephrosis. Bladder is decompressed without gross abnormality. Stomach/Bowel: No bowel obstruction or ileus. Normal appendix right lower quadrant. Scattered colonic diverticulosis without evidence of diverticulitis. No bowel wall thickening or inflammatory change. Vascular/Lymphatic: Aortic atherosclerosis. No enlarged abdominal or pelvic lymph nodes. Reproductive: Status post hysterectomy. No adnexal masses. Other: No free fluid or free intraperitoneal gas. Small fat containing umbilical hernia. No bowel  herniation. Musculoskeletal: No acute or destructive bony lesions. Reconstructed images demonstrate no additional findings. IMPRESSION: 1. No acute intra-abdominal or intrapelvic process. 2. Scattered colonic diverticulosis without diverticulitis. Electronically Signed   By: Randa Ngo M.D.   On: 03/29/2021 18:46    Assessment and Plan:   Ashley [name] is a 56 y.o. y/o female who comes in today with worsening heartburn.  The patient states that her abdominal pain is getting much better but is clearly musculoskeletal with the ability to reproduce the abdominal pain with flexing the abdominal wall muscles by lifting the patient's leg 6 inches above the exam table while palpating the abdominal wall with 1 finger lightly.  The patient has been explained that this is musculoskeletal pain.  The patient continues to have heartburn but denies dysphagia black stools or bloody stools.  The patient will be started on pantoprazole 40 mg a day to see if this helps her symptoms.  The patient has been explained the plan agrees with it.    [name], MD. Marval Regal    Note: This dictation was prepared with Dragon dictation along with smaller phrase technology. Any transcriptional errors that result from this process are unintentional.

## 2021-04-16 ENCOUNTER — Encounter: Payer: Self-pay | Admitting: Gastroenterology

## 2021-04-16 ENCOUNTER — Ambulatory Visit (INDEPENDENT_AMBULATORY_CARE_PROVIDER_SITE_OTHER): Payer: Managed Care, Other (non HMO) | Admitting: Gastroenterology

## 2021-04-16 ENCOUNTER — Other Ambulatory Visit: Payer: Self-pay

## 2021-04-16 VITALS — BP 130/85 | HR 76 | Temp 98.2°F | Ht 66.0 in | Wt 175.0 lb

## 2021-04-16 DIAGNOSIS — R109 Unspecified abdominal pain: Secondary | ICD-10-CM | POA: Diagnosis not present

## 2021-04-16 DIAGNOSIS — K219 Gastro-esophageal reflux disease without esophagitis: Secondary | ICD-10-CM | POA: Diagnosis not present

## 2021-04-16 MED ORDER — PANTOPRAZOLE SODIUM 40 MG PO TBEC: 40.0000 mg | DELAYED_RELEASE_TABLET | Freq: Every day | ORAL | 6 refills | Status: DC

## 2021-04-17 DIAGNOSIS — A609 Anogenital herpesviral infection, unspecified: Secondary | ICD-10-CM

## 2021-04-18 MED ORDER — VALACYCLOVIR HCL 1 G PO TABS: 1000.0000 mg | ORAL_TABLET | Freq: Every day | ORAL | 0 refills | Status: AC

## 2021-05-09 ENCOUNTER — Other Ambulatory Visit: Payer: Self-pay

## 2021-05-09 ENCOUNTER — Ambulatory Visit: Payer: Managed Care, Other (non HMO) | Admitting: Primary Care

## 2021-05-09 ENCOUNTER — Encounter: Payer: Self-pay | Admitting: Primary Care

## 2021-05-09 DIAGNOSIS — F4323 Adjustment disorder with mixed anxiety and depressed mood: Secondary | ICD-10-CM | POA: Insufficient documentation

## 2021-05-09 NOTE — Progress Notes (Signed)
? ?Subjective:  ? ? Patient ID: Ashley [name], female    DOB: 1965-05-28, 56 y.o.   MRN: [id number] ? ?HPI ? ?Ashley [name] is a very pleasant 56 y.o. female history of hypertension, GERD, osteoarthritis, hyperlipidemia, prediabetes, palpitations who presents today to discuss depression symptoms. ? ?Symptoms began in Summer 2022 after her mother passed away. include tearfulness, grieving her mother, increased stress with caring for her brother, stress with her overall health, isolating herself, fatigue, reduced appetite, has been out of work for the last five days.  ? ?She began seeing a therapist through her employer, first visit was yesterday. Her employer encouraged her to take a leave of absence. She can't focus at work, she is tearful at work, cannot work as she is frequently receiving calls for her brother's healthcare as she is the sole caregiver for him.  ? ?She agrees that she needs a leave of absence to grieve the loss of her mother who passed away last Summer and to care for her mentally ill brother. She would like her leave of absence to start today, 05/09/21 through Jun 26, 2021 with return to work date of Jun 27, 2021. She is needing a work note today to send to HR. ? ?Her neurologist prescribed citalopram 10 mg last year for her symptoms last year but she never started taking. She was advised by her therapist to start taking citalopram, took her first dose 2 nights agp. She was prescribed duloxetine 20 mg in Fall 2022 by her neurologist for pain, and she never took. ? ?BP Readings from Last 3 Encounters:  ?05/09/21 132/84  ?04/16/21 130/85  ?03/29/21 (!) 154/95  ? ?PHQ9 SCORE ONLY 05/09/2021 03/02/2021 12/29/2019  ?PHQ-9 Total Score 24 0 0  ? ?GAD 7 : Generalized Anxiety Score 05/09/2021  ?Nervous, Anxious, on Edge 3  ?Control/stop worrying 2  ?Worry too much - different things 3  ?Trouble relaxing 1  ?Restless 1  ?Easily annoyed or irritable 3  ?Afraid - awful might happen 0  ?Total GAD 7  Score 13  ? ? ? ? ? ?Review of Systems  ?Respiratory:  Negative for shortness of breath.   ?Cardiovascular:  Negative for chest pain.  ?Musculoskeletal:  Positive for arthralgias.  ?Neurological:  Positive for headaches.  ?Psychiatric/Behavioral:  The patient is nervous/anxious.   ? ?   ? ? ?Past Medical History:  ?Diagnosis Date  ? Anemia   ? Bronchitis   ? 2 years ago   ? [place] arthritis 2019  ? base of left thumb   ? GERD (gastroesophageal reflux disease)   ? diet controlled , no meds  ? Hydrosalpinx   ? bilateral   ? Hyperlipidemia   ? recent dx - currently diet control - no meds  ? Hypertension   ? Hypothyroid 2003  ? radio active iodine - no meds  ? Left-sided weakness 02/12/2019  ? Ovarian cyst, left 02/23/2016  ? SVD (spontaneous vaginal delivery)   ? x 1  ? Urinary frequency 11/30/2018  ? Vitamin B 12 deficiency   ? ? ?Social History  ? ?Socioeconomic History  ? Marital status: Single  ?  Spouse name: Not on file  ? Number of children: Not on file  ? Years of education: Not on file  ? Highest education level: Not on file  ?Occupational History  ? Not on file  ?Tobacco Use  ? Smoking status: Never  ? Smokeless tobacco: Never  ?Vaping Use  ? Vaping Use: Never used  ?  Substance and Sexual Activity  ? Alcohol use: No  ? Drug use: No  ? Sexual activity: Yes  ?  Partners: Male  ?  Birth control/protection: Surgical  ?  Comment: hysterectomy.  ?Other Topics Concern  ? Not on file  ?Social History Narrative  ? Single.  ? 3 children.  ? Works at [employer] as a Designer, multimedia.  ? Enjoys relaxing, exercise, walking her dog.   ? ?Social Determinants of Health  ? ?Financial Resource Strain: Not on file  ?Food Insecurity: Not on file  ?Transportation Needs: Not on file  ?Physical Activity: Not on file  ?Stress: Not on file  ?Social Connections: Not on file  ?Intimate Partner Violence: Not on file  ? ? ?Past Surgical History:  ?Procedure Laterality Date  ? CESAREAN SECTION    ? x 2  ? CHOLECYSTECTOMY  2008  ? DILATION AND CURETTAGE OF  UTERUS    ? X 2  ? LAPAROSCOPIC BILATERAL SALPINGECTOMY Bilateral 07/29/2017  ? Procedure: LAPAROSCOPIC LEFT SALPINGOOPHRECTOMY: Partial right Salpingectomy;  Surgeon: Lavonia Drafts, MD;  Location: [facility];  Service: Gynecology;  Laterality: Bilateral;  ? SHOULDER SURGERY  2016  ? arthroscopic  ? TOTAL ABDOMINAL HYSTERECTOMY  2006  ? Fibroids, benign pathology; partial hysterectomy per patient   ? TUBAL LIGATION    ? WISDOM TOOTH EXTRACTION    ? at age 34 yr  ? WRIST ARTHROSCOPY Right 2007  ? ? ?Family History  ?Problem Relation Age of Onset  ? Hypertension Mother   ? Other Father   ?     unknown medical history  ? Hypertension Maternal Grandmother   ? Heart failure Maternal Grandmother   ? Lung cancer Maternal Grandmother   ? Breast cancer Maternal Aunt   ? Colon cancer Neg Hx   ? Colon polyps Neg Hx   ? Rectal cancer Neg Hx   ? Stomach cancer Neg Hx   ? ? ?Allergies  ?Allergen Reactions  ? Levaquin [Levofloxacin]   ?  Presumed cause of joint aches  ? ? ?Current Outpatient Medications on File Prior to Visit  ?Medication Sig Dispense Refill  ? amLODipine (NORVASC) 5 MG tablet Take 1 tablet (5 mg total) by mouth daily. For blood pressure. 90 tablet 0  ? aspirin 81 MG EC tablet Take by mouth.    ? atorvastatin (LIPITOR) 20 MG tablet Take 1 tablet (20 mg total) by mouth daily. For cholesterol. 90 tablet 1  ? azelastine (ASTELIN) 0.1 % nasal spray Place into the nose.    ? DULoxetine (CYMBALTA) 20 MG capsule     ? famotidine (PEPCID) 20 MG tablet Take 1 tablet (20 mg total) by mouth daily.    ? fluticasone (FLONASE) 50 MCG/ACT nasal spray Place 1 spray into both nostrils 2 (two) times daily. 16 g 2  ? gabapentin (NEURONTIN) 600 MG tablet Take 1,200 mg by mouth daily.    ? meclizine (ANTIVERT) 25 MG tablet Take 25 mg by mouth 3 (three) times daily as needed.    ? pantoprazole (PROTONIX) 40 MG tablet Take 1 tablet (40 mg total) by mouth daily. 30 tablet 6  ? ?No current facility-administered  medications on file prior to visit.  ? ? ?BP 132/84   Pulse 87   Ht '5\' 6"'$  (1.676 m)   Wt 176 lb 3.2 oz (79.9 kg)   SpO2 97%   BMI 28.44 kg/m?  ?Objective:  ? Physical Exam ?Cardiovascular:  ?   Rate and Rhythm: Normal  rate and regular rhythm.  ?Pulmonary:  ?   Effort: Pulmonary effort is normal.  ?   Breath sounds: Normal breath sounds.  ?Musculoskeletal:  ?   Cervical back: Neck supple.  ?Skin: ?   General: Skin is warm and dry.  ?Psychiatric:     ?   Mood and Affect: Mood normal.  ? ? ? ? ? ?   ?Assessment & Plan:  ? ? ? ? ?This visit occurred during the SARS-CoV-2 public health emergency.  Safety protocols were in place, including screening questions prior to the visit, additional usage of staff PPE, and extensive cleaning of exam room while observing appropriate contact time as indicated for disinfecting solutions.  ?

## 2021-05-09 NOTE — Patient Instructions (Signed)
Start citalopram (Celexa) 20 mg for anxiety and depression. Take with 10 mg by mouth once daily for about one week, then increase to 2 full tablets thereafter.  ? ?Please notify me if you need a refill prior to our next visit. ? ?Please also notify me if your employer fax [place] paperwork for completion. ? ?We will see you in late April for follow-up, contact me sooner if needed. ? ?It was a pleasure to see you today! ? ?

## 2021-05-09 NOTE — Assessment & Plan Note (Signed)
Uncontrolled, ready for treatment. ? ?Commended her on connecting with a therapist, encourage continued sessions. ? ?We decided to move forward with citalopram with a starting dose of 10 mg daily x1 week, then increase to 20 mg thereafter.  She will notify us when she is needing a refill ? ?We discussed possible side effects of headache, GI upset, drowsiness, and SI/HI. ?If thoughts of SI/HI develop, we discussed to present to the emergency immediately. ?Patient verbalized understanding.  ? ?Agree to provide continuous medical leave with a started of 05/09/2021 through 06/26/2021 with a return date of 06/27/2021. ? ? ?Follow up in 6 weeks for re-evaluation.  ? ?

## 2021-05-17 ENCOUNTER — Other Ambulatory Visit: Payer: Self-pay

## 2021-05-17 ENCOUNTER — Ambulatory Visit: Payer: Managed Care, Other (non HMO) | Admitting: Primary Care

## 2021-05-17 ENCOUNTER — Encounter: Payer: Self-pay | Admitting: Primary Care

## 2021-05-17 DIAGNOSIS — L819 Disorder of pigmentation, unspecified: Secondary | ICD-10-CM | POA: Insufficient documentation

## 2021-05-17 HISTORY — DX: Disorder of pigmentation, unspecified: L81.9

## 2021-05-17 NOTE — Progress Notes (Signed)
? ?Subjective:  ? ? Patient ID: Ashley [name], female    DOB: 08/09/1965, 56 y.o.   MRN: [id number] ? ?HPI ? ?[name] is a very pleasant 56 y.o. female with a history of TIA, hypertension, neuropathy, osteoarthritis, prediabetes, palpitations, anxiety/depression who presents today to discuss skin discoloration. ? ?She's noticed darker skin discoloration to the right anterior medial ankle and left anterior lateral ankle last week after getting out of the shower. She's unsure if this is just a tan or if she has a skin condition or circulation disorder. She does wear short socks and ankle leggings, does remember sitting outside recently.  ? ?She denies itching, changes to the skin texture. She has occasional ankle edema but attributes this to her amlodipine.  ? ?Review of Systems  ?Cardiovascular:  Negative for leg swelling.  ?Musculoskeletal:  Negative for joint swelling.  ?Skin:  Positive for color change. Negative for rash and wound.  ? ?   ? ? ?Past Medical History:  ?Diagnosis Date  ? Anemia   ? Bronchitis   ? 2 years ago   ? [place] arthritis 2019  ? base of left thumb   ? GERD (gastroesophageal reflux disease)   ? diet controlled , no meds  ? Hydrosalpinx   ? bilateral   ? Hyperlipidemia   ? recent dx - currently diet control - no meds  ? Hypertension   ? Hypothyroid 2003  ? radio active iodine - no meds  ? Left-sided weakness 02/12/2019  ? Ovarian cyst, left 02/23/2016  ? SVD (spontaneous vaginal delivery)   ? x 1  ? Urinary frequency 11/30/2018  ? Vitamin B 12 deficiency   ? ? ?Social History  ? ?Socioeconomic History  ? Marital status: Single  ?  Spouse name: Not on file  ? Number of children: Not on file  ? Years of education: Not on file  ? Highest education level: Not on file  ?Occupational History  ? Not on file  ?Tobacco Use  ? Smoking status: Never  ? Smokeless tobacco: Never  ?Vaping Use  ? Vaping Use: Never used  ?Substance and Sexual Activity  ? Alcohol use: No  ? Drug use: No  ?  Sexual activity: Yes  ?  Partners: Male  ?  Birth control/protection: Surgical  ?  Comment: hysterectomy.  ?Other Topics Concern  ? Not on file  ?Social History Narrative  ? Single.  ? 3 children.  ? Works at [employer] as a Designer, multimedia.  ? Enjoys relaxing, exercise, walking her dog.   ? ?Social Determinants of Health  ? ?Financial Resource Strain: Not on file  ?Food Insecurity: Not on file  ?Transportation Needs: Not on file  ?Physical Activity: Not on file  ?Stress: Not on file  ?Social Connections: Not on file  ?Intimate Partner Violence: Not on file  ? ? ?Past Surgical History:  ?Procedure Laterality Date  ? CESAREAN SECTION    ? x 2  ? CHOLECYSTECTOMY  2008  ? DILATION AND CURETTAGE OF UTERUS    ? X 2  ? LAPAROSCOPIC BILATERAL SALPINGECTOMY Bilateral 07/29/2017  ? Procedure: LAPAROSCOPIC LEFT SALPINGOOPHRECTOMY: Partial right Salpingectomy;  Surgeon: Lavonia Drafts, MD;  Location: [facility];  Service: Gynecology;  Laterality: Bilateral;  ? SHOULDER SURGERY  2016  ? arthroscopic  ? TOTAL ABDOMINAL HYSTERECTOMY  2006  ? Fibroids, benign pathology; partial hysterectomy per patient   ? TUBAL LIGATION    ? WISDOM TOOTH EXTRACTION    ?  at age 32 yr  ? WRIST ARTHROSCOPY Right 2007  ? ? ?Family History  ?Problem Relation Age of Onset  ? Hypertension Mother   ? Other Father   ?     unknown medical history  ? Hypertension Maternal Grandmother   ? Heart failure Maternal Grandmother   ? Lung cancer Maternal Grandmother   ? Breast cancer Maternal Aunt   ? Colon cancer Neg Hx   ? Colon polyps Neg Hx   ? Rectal cancer Neg Hx   ? Stomach cancer Neg Hx   ? ? ?Allergies  ?Allergen Reactions  ? Levaquin [Levofloxacin]   ?  Presumed cause of joint aches  ? ? ?Current Outpatient Medications on File Prior to Visit  ?Medication Sig Dispense Refill  ? amLODipine (NORVASC) 5 MG tablet Take 1 tablet (5 mg total) by mouth daily. For blood pressure. 90 tablet 0  ? aspirin 81 MG EC tablet Take by mouth.    ? atorvastatin  (LIPITOR) 20 MG tablet Take 1 tablet (20 mg total) by mouth daily. For cholesterol. 90 tablet 1  ? azelastine (ASTELIN) 0.1 % nasal spray Place into the nose.    ? citalopram (CELEXA) 20 MG tablet Take 20 mg by mouth daily.    ? famotidine (PEPCID) 20 MG tablet Take 1 tablet (20 mg total) by mouth daily.    ? fluticasone (FLONASE) 50 MCG/ACT nasal spray Place 1 spray into both nostrils 2 (two) times daily. 16 g 2  ? gabapentin (NEURONTIN) 600 MG tablet Take 1,200 mg by mouth daily.    ? pantoprazole (PROTONIX) 40 MG tablet Take 1 tablet (40 mg total) by mouth daily. 30 tablet 6  ? meclizine (ANTIVERT) 25 MG tablet Take 25 mg by mouth 3 (three) times daily as needed. (Patient not taking: Reported on 05/17/2021)    ? ?No current facility-administered medications on file prior to visit.  ? ? ?BP 130/76   Pulse 70   Temp 98 ?F (36.7 ?C) (Oral)   Ht '5\' 6"'$  (1.676 m)   Wt 177 lb (80.3 kg)   BMI 28.57 kg/m?  ?Objective:  ? Physical Exam ?Cardiovascular:  ?   Pulses:     ?     Dorsalis pedis pulses are 2+ on the right side and 2+ on the left side.  ?     Posterior tibial pulses are 2+ on the right side and 2+ on the left side.  ?   Comments: No lower extremity edema noted ?Pulmonary:  ?   Effort: Pulmonary effort is normal.  ?Skin: ?   General: Skin is warm and dry.  ?   Findings: No erythema.  ?   Comments: Slightly darker skin tone to the areas of concern, right anterior medial ankle, left anterior lateral ankle.  No scaling, wounds, skin texture changes.  ? ? ? ? ? ?   ?Assessment & Plan:  ? ? ? ? ?This visit occurred during the SARS-CoV-2 public health emergency.  Safety protocols were in place, including screening questions prior to the visit, additional usage of staff PPE, and extensive cleaning of exam room while observing appropriate contact time as indicated for disinfecting solutions.  ?

## 2021-05-17 NOTE — Assessment & Plan Note (Signed)
Exam today representative of suntan.  In fact, today she is wearing short socks and ankle leggings which match the lining of the discoloration.  The area of darker skin revealed matches the edge of her sock and pant line. ? ?She will monitor the site and report any changes. ?

## 2021-05-17 NOTE — Patient Instructions (Signed)
It was a pleasure to see you today!   

## 2021-05-24 ENCOUNTER — Telehealth: Payer: Self-pay | Admitting: Primary Care

## 2021-05-24 NOTE — Telephone Encounter (Signed)
Ashley [name] with [employer] called checking on the status of pt disability form that he faxed yesterday. Please advise. ?

## 2021-05-24 NOTE — Telephone Encounter (Signed)
Have you received this yet?  ?

## 2021-05-25 NOTE — Telephone Encounter (Signed)
I completed her disability forms last week. ?Do not see anything new. ?

## 2021-05-29 NOTE — Telephone Encounter (Signed)
Forms were faxed and no new information has been received. ?

## 2021-06-14 NOTE — Telephone Encounter (Signed)
Pt called to say they have sent more forms on 06-11-21. Pt is asking if those have been received. She is aware Anda Kraft will be out of the office next week and wanted to make sure they were in and done. Please advise [phone] ?

## 2021-06-15 NOTE — Telephone Encounter (Signed)
Can you find out what's going on? ?

## 2021-06-19 ENCOUNTER — Encounter: Payer: Managed Care, Other (non HMO) | Admitting: Primary Care

## 2021-06-22 ENCOUNTER — Ambulatory Visit
Admission: EM | Admit: 2021-06-22 | Discharge: 2021-06-22 | Disposition: A | Discharge status: Home / Self Care | Payer: Managed Care, Other (non HMO) | Attending: Emergency Medicine | Admitting: Emergency Medicine

## 2021-06-22 DIAGNOSIS — R102 Pelvic and perineal pain: Secondary | ICD-10-CM | POA: Insufficient documentation

## 2021-06-22 DIAGNOSIS — R350 Frequency of micturition: Secondary | ICD-10-CM | POA: Insufficient documentation

## 2021-06-22 LAB — URINALYSIS, ROUTINE W REFLEX MICROSCOPIC
Bilirubin Urine: NEGATIVE
Glucose, UA: NEGATIVE mg/dL
Hgb urine dipstick: NEGATIVE
Ketones, ur: NEGATIVE mg/dL
Leukocytes,Ua: NEGATIVE
Nitrite: NEGATIVE
Protein, ur: NEGATIVE mg/dL
Specific Gravity, Urine: 1.02 (ref 1.005–1.030)
pH: 6 (ref 5.0–8.0)

## 2021-06-22 LAB — WET PREP, GENITAL
Clue Cells Wet Prep HPF POC: NONE SEEN
Sperm: NONE SEEN
Trich, Wet Prep: NONE SEEN
WBC, Wet Prep HPF POC: >=10 — AB (ref <10)
Yeast Wet Prep HPF POC: NONE SEEN

## 2021-06-22 MED ORDER — PHENAZOPYRIDINE HCL 200 MG PO TABS: 200.0000 mg | ORAL_TABLET | Freq: Three times a day (TID) | ORAL | 0 refills | Status: DC

## 2021-06-22 NOTE — ED Triage Notes (Signed)
Pt c/o lower abdominal pain x2days. Pt states that the pain is right above the groin.  ? ?Pt states that it was sudden and has been having more bowel movements than normal. ? ?Pt states that she has had no vaginal itching or urinary burning. Pt states that when going to void, pt feels pain right at the bikini line.  ? ?Pt states that she feels abdominal pain constantly but using the restroom makes it worse.  ?

## 2021-06-22 NOTE — ED Provider Notes (Signed)
[facility]    CSN: [id number] Arrival date & time: 06/22/21  1621      History   Chief Complaint Chief Complaint  Patient presents with   Abdominal Pain    HPI Ashley [name] is a 56 y.o. female.   Patient presents with urinary frequency, constant lower abdominal pressure and intermittent lower back pain for 2 days.  Symptoms are worsened with urinating and pain can be felt in the bilateral groin.  Has attempted use of Motrin which has been somewhat helpful.  Denies hematuria, fever, chills, vaginal discharge, itching or odor.  ,   Past Medical History:  Diagnosis Date   Anemia    Bronchitis    2 years ago    [facility] arthritis 2019   base of left thumb    GERD (gastroesophageal reflux disease)    diet controlled , no meds   Hydrosalpinx    bilateral    Hyperlipidemia    recent dx - currently diet control - no meds   Hypertension    Hypothyroid 2003   radio active iodine - no meds   Left-sided weakness 02/12/2019   Ovarian cyst, left 02/23/2016   SVD (spontaneous vaginal delivery)    x 1   Urinary frequency 11/30/2018   Vitamin B 12 deficiency     Patient Active Problem List   Diagnosis Date Noted   Discoloration of skin 05/17/2021   Adjustment reaction with anxiety and depression 05/09/2021   Cough 10/04/2020   Palpitations 10/04/2020   Prediabetes 06/15/2020   Chronic hand pain 05/10/2020   Vaginal discharge 12/29/2019   Neuropathy 12/13/2019   Lumbar stenosis without neurogenic claudication 12/09/2019   Left ear pain 11/19/2019   Cervical radiculitis 11/19/2019   Foraminal stenosis of cervical region 11/19/2019   Vertigo 10/07/2019   Acute midline low back pain without sciatica 09/10/2019   Joint pain 09/01/2019   Adverse effect of drug 08/15/2019   HSV (herpes simplex virus) anogenital infection 06/25/2019   Preventative health care 06/23/2019   Numbness 04/22/2019   Constipation 04/07/2019   Neck pain 02/17/2019   HTN  (hypertension), malignant    TIA (transient ischemic attack)    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   Numbness and tingling 02/01/2019   Urinary frequency 11/30/2018   Ganglion cyst of tendon sheath of left hand 11/13/2018   Acquired trigger finger 10/26/2018   Osteoarthritis 06/30/2018   Vitamin D deficiency 06/23/2018   Vitamin B 12 deficiency 06/23/2018   Abdominal pain 06/23/2018   GERD (gastroesophageal reflux disease) 01/30/2018   Hyperlipidemia 03/19/2017   Allergic rhinitis 03/19/2017   Pelvic pain 02/07/2016   S/P shoulder surgery 02/28/2015   Impingement syndrome of left shoulder 12/27/2014   Impingement syndrome of right shoulder 12/27/2014   Chronic right shoulder pain 11/29/2014    Past Surgical History:  Procedure Laterality Date   CESAREAN SECTION     x 2   CHOLECYSTECTOMY  2008   DILATION AND CURETTAGE OF UTERUS     X 2   LAPAROSCOPIC BILATERAL SALPINGECTOMY Bilateral 07/29/2017   Procedure: LAPAROSCOPIC LEFT SALPINGOOPHRECTOMY: Partial right Salpingectomy;  Surgeon: Willodean Rosenthal, MD;  Location: [facility] [place];  Service: Gynecology;  Laterality: Bilateral;   SHOULDER SURGERY  2016   arthroscopic   TOTAL ABDOMINAL HYSTERECTOMY  2006   Fibroids, benign pathology; partial hysterectomy per patient    TUBAL LIGATION     WISDOM TOOTH EXTRACTION     at age 81 yr   WRIST  ARTHROSCOPY Right 2007    OB History     Gravida  5   Para  3   Term  3   Preterm  0   AB  2   Living  3      SAB  2   IAB  0   Ectopic  0   Multiple  0   Live Births  3            Home Medications    Prior to Admission medications   Medication Sig Start Date End Date Taking? Authorizing Provider  amLODipine (NORVASC) 5 MG tablet Take 1 tablet (5 mg total) by mouth daily. For blood pressure. 03/13/21  Yes Doreene Nest, NP  aspirin 81 MG EC tablet Take by mouth. 09/16/19  Yes [provider]  atorvastatin (LIPITOR) 20 MG tablet Take 1  tablet (20 mg total) by mouth daily. For cholesterol. 10/25/20  Yes Doreene Nest, NP  azelastine (ASTELIN) 0.1 % nasal spray Place into the nose. 03/11/19  Yes [provider]  citalopram (CELEXA) 20 MG tablet Take 20 mg by mouth daily.   Yes [provider]  famotidine (PEPCID) 20 MG tablet Take 1 tablet (20 mg total) by mouth daily. 10/03/20  Yes Joaquim Nam, MD  fluticasone [facility]) 50 MCG/ACT nasal spray Place 1 spray into both nostrils 2 (two) times daily. 11/19/19  Yes Tower, Audrie Gallus, MD  gabapentin (NEURONTIN) 600 MG tablet Take 1,200 mg by mouth daily. 01/25/20  Yes [provider]  meclizine (ANTIVERT) 25 MG tablet Take 25 mg by mouth 3 (three) times daily as needed. Patient not taking: Reported on 05/17/2021    [provider]  pantoprazole (PROTONIX) 40 MG tablet Take 1 tablet (40 mg total) by mouth daily. 04/16/21   Midge Minium, MD    Family History Family History  Problem Relation Age of Onset   Hypertension Mother    Other Father        unknown medical history   Hypertension Maternal Grandmother    Heart failure Maternal Grandmother    Lung cancer Maternal Grandmother    Breast cancer Maternal Aunt    Colon cancer Neg Hx    Colon polyps Neg Hx    Rectal cancer Neg Hx    Stomach cancer Neg Hx     Social History Social History   Tobacco Use   Smoking status: Never   Smokeless tobacco: Never  Vaping Use   Vaping Use: Never used  Substance Use Topics   Alcohol use: No   Drug use: No     Allergies   Levaquin [levofloxacin]   Review of Systems Review of Systems  Constitutional: Negative.   HENT: Negative.    Respiratory: Negative.    Cardiovascular: Negative.   Gastrointestinal: Negative.   Genitourinary:  Positive for dysuria, frequency and pelvic pain. Negative for decreased urine volume, difficulty urinating, dyspareunia, enuresis, flank pain, genital sores, hematuria, menstrual problem, urgency, vaginal  bleeding, vaginal discharge and vaginal pain.    Physical Exam Triage Vital Signs ED Triage Vitals  Enc Vitals Group     BP 06/22/21 1640 (!) 132/94     Pulse Rate 06/22/21 1640 100     Resp 06/22/21 1640 18     Temp 06/22/21 1640 99 F (37.2 C)     Temp Source 06/22/21 1640 Oral     SpO2 06/22/21 1640 100 %     Weight 06/22/21 1636 172 lb (78 kg)  Height 06/22/21 1636 5\' 6"  (1.676 m)     Head Circumference --      Peak Flow --      Pain Score 06/22/21 1636 6     Pain Loc --      Pain Edu? --      Excl. in GC? --    No data found.  Updated Vital Signs BP (!) 132/94 (BP Location: Left Arm)   Pulse 100   Temp 99 F (37.2 C) (Oral)   Resp 18   Ht 5\' 6"  (1.676 m)   Wt 172 lb (78 kg)   SpO2 100%   BMI 27.76 kg/m   Visual Acuity Right Eye Distance:   Left Eye Distance:   Bilateral Distance:    Right Eye Near:   Left Eye Near:    Bilateral Near:     Physical Exam Constitutional:      Appearance: She is well-developed.  HENT:     Head: Normocephalic.  Pulmonary:     Effort: Pulmonary effort is normal.  Abdominal:     General: Abdomen is flat. Bowel sounds are normal.     Palpations: Abdomen is soft.     Tenderness: There is abdominal tenderness in the suprapubic area.  Skin:    General: Skin is warm and dry.  Neurological:     General: No focal deficit present.     Mental Status: She is alert and oriented to person, place, and time.  Psychiatric:        Mood and Affect: Mood normal.        Behavior: Behavior normal.     UC Treatments / Results  Labs (all labs ordered are listed, but only abnormal results are displayed) Labs Reviewed  URINALYSIS, ROUTINE W REFLEX MICROSCOPIC    EKG   Radiology No results found.  Procedures Procedures (including critical care time)  Medications Ordered in UC Medications - No data to display  Initial Impression / Assessment and Plan / UC Course  I have reviewed the triage vital signs and the nursing  notes.  Pertinent labs & imaging results that were available during my care of the patient were reviewed by me and considered in my medical decision making (see chart for details).  Suprapubic abdominal pain Urinary frequency  Urinalysis negative, sent for culture, wet prep negative, discussed findings with patient, prescribed Pyridium for management of symptoms until culture returns, vies increase fluid intake and good feminine hygiene, patient may also attempt use of an over-the-counter boric acid suppository for additional symptom management, patient has upcoming appointment with her PCP on this Tuesday, recommended follow-up at that time Final Clinical Impressions(s) / UC Diagnoses   Final diagnoses:  None   Discharge Instructions   None    ED Prescriptions   None    PDMP not reviewed this encounter.   Valinda Hoar, NP 06/22/21 1743

## 2021-06-22 NOTE — Discharge Instructions (Addendum)
Urinalysis negative, has been sent to the lab to determine if bacterial will grow, if this is seen you will be notified and antibiotics sent in at that time ? ?Wet prep negative for yeast, bacterial vaginosis and trichomoniasis ? ?Continue use of Azo, you can take up to 3 times a day for 2 days, this medication will turn your urine orange, this medication is to help reduce your symptoms ? ?May attempt use of an over-the-counter boric acid suppository in addition for further management ? ?As you have an upcoming appointment with your primary care doctor, they may reevaluate your symptoms at that time ? ? ?

## 2021-06-24 LAB — URINE CULTURE: Culture: >=100000 — AB

## 2021-06-25 ENCOUNTER — Telehealth (HOSPITAL_COMMUNITY): Payer: Self-pay

## 2021-06-25 MED ORDER — NITROFURANTOIN MONOHYD MACRO 100 MG PO CAPS: 100.0000 mg | ORAL_CAPSULE | Freq: Two times a day (BID) | ORAL | 0 refills | Status: DC

## 2021-06-25 NOTE — Progress Notes (Signed)
Pt notified of positive test results and medication sent to pharmacy on file.

## 2021-06-26 ENCOUNTER — Encounter: Payer: Self-pay | Admitting: Primary Care

## 2021-06-26 ENCOUNTER — Ambulatory Visit (INDEPENDENT_AMBULATORY_CARE_PROVIDER_SITE_OTHER): Payer: Managed Care, Other (non HMO) | Admitting: Primary Care

## 2021-06-26 VITALS — BP 134/80 | HR 104 | Ht 66.0 in | Wt 176.0 lb

## 2021-06-26 DIAGNOSIS — G8929 Other chronic pain: Secondary | ICD-10-CM

## 2021-06-26 DIAGNOSIS — Z0001 Encounter for general adult medical examination with abnormal findings: Secondary | ICD-10-CM | POA: Diagnosis not present

## 2021-06-26 DIAGNOSIS — Z8673 Personal history of transient ischemic attack (TIA), and cerebral infarction without residual deficits: Secondary | ICD-10-CM

## 2021-06-26 DIAGNOSIS — K219 Gastro-esophageal reflux disease without esophagitis: Secondary | ICD-10-CM | POA: Diagnosis not present

## 2021-06-26 DIAGNOSIS — F4323 Adjustment disorder with mixed anxiety and depressed mood: Secondary | ICD-10-CM

## 2021-06-26 DIAGNOSIS — A609 Anogenital herpesviral infection, unspecified: Secondary | ICD-10-CM

## 2021-06-26 DIAGNOSIS — G629 Polyneuropathy, unspecified: Secondary | ICD-10-CM

## 2021-06-26 DIAGNOSIS — E785 Hyperlipidemia, unspecified: Secondary | ICD-10-CM

## 2021-06-26 DIAGNOSIS — R7303 Prediabetes: Secondary | ICD-10-CM

## 2021-06-26 DIAGNOSIS — M542 Cervicalgia: Secondary | ICD-10-CM

## 2021-06-26 DIAGNOSIS — I10 Essential (primary) hypertension: Secondary | ICD-10-CM | POA: Diagnosis not present

## 2021-06-26 DIAGNOSIS — M159 Polyosteoarthritis, unspecified: Secondary | ICD-10-CM

## 2021-06-26 LAB — CBC
HCT: 42.5 % (ref 36.0–46.0)
Hemoglobin: 13.6 g/dL (ref 12.0–15.0)
MCHC: 32 g/dL (ref 30.0–36.0)
MCV: 85.9 fl (ref 78.0–100.0)
Platelets: 240 10*3/uL (ref 150.0–400.0)
RBC: 4.95 Mil/uL (ref 3.87–5.11)
RDW: 15.7 % — ABNORMAL HIGH (ref 11.5–15.5)
WBC: 3.5 10*3/uL — ABNORMAL LOW (ref 4.0–10.5)

## 2021-06-26 LAB — LIPID PANEL
Cholesterol: 174 mg/dL (ref 0–200)
HDL: 70 mg/dL (ref >39.00)
LDL Cholesterol: 91 mg/dL (ref 0–99)
NonHDL: 104.37
Total CHOL/HDL Ratio: 2
Triglycerides: 65 mg/dL (ref 0.0–149.0)
VLDL: 13 mg/dL (ref 0.0–40.0)

## 2021-06-26 LAB — HEMOGLOBIN A1C: Hgb A1c MFr Bld: 5.7 % (ref 4.6–6.5)

## 2021-06-26 MED ORDER — SERTRALINE HCL 25 MG PO TABS: 25.0000 mg | ORAL_TABLET | Freq: Every day | ORAL | 0 refills | Status: DC

## 2021-06-26 MED ORDER — ATORVASTATIN CALCIUM 10 MG PO TABS: 10.0000 mg | ORAL_TABLET | Freq: Every day | ORAL | 3 refills | Status: DC

## 2021-06-26 NOTE — Assessment & Plan Note (Addendum)
Slightly improved but still uncontrolled.  ?Intolerant to citalopram. ? ?Discussed different options for treatment, recommended Zoloft 25 mg daily, she agrees ? ?Start Zoloft 25 mg once daily, Rx sent to pharmacy. ?Encouraged regular follow up with therapy.  ? ?Agree to extend FMLA through May 31st with tentative return to work date of July 26, 2021. Follow up in 3-4 weeks. ? ?Will provide letter and fax to [phone]. She will also send paperwork through [employer]. ?

## 2021-06-26 NOTE — Assessment & Plan Note (Signed)
Discussed the importance of a healthy diet and regular exercise in order for weight loss, and to reduce the risk of further co-morbidity. ? ?Repeat A1C pending. ?

## 2021-06-26 NOTE — Assessment & Plan Note (Signed)
Stable. ? ?Continue gabapentin 600 mg twice daily as needed. ?

## 2021-06-26 NOTE — Assessment & Plan Note (Signed)
Overall controlled.  ? ?Continue gabapentin 600 mg BID PRN. ? ? ?

## 2021-06-26 NOTE — Patient Instructions (Signed)
Start sertraline (Zoloft) 25 mg for anxiety and depression.  Take 1 tablet by mouth once daily. ? ?Stop by the lab prior to leaving today. I will notify you of your results once received.  ? ?Schedule a follow-up visit for reevaluation at the end of May. ? ?It was a pleasure to see you today! ? ?Preventive Care 50-57 Years Old, Female ?Preventive care refers to lifestyle choices and visits with your health care provider that can promote health and wellness. Preventive care visits are also called wellness exams. ?What can I expect for my preventive care visit? ?Counseling ?Your health care provider may ask you questions about your: ?Medical history, including: ?Past medical problems. ?Family medical history. ?Pregnancy history. ?Current health, including: ?Menstrual cycle. ?Method of birth control. ?Emotional well-being. ?Home life and relationship well-being. ?Sexual activity and sexual health. ?Lifestyle, including: ?Alcohol, nicotine or tobacco, and drug use. ?Access to firearms. ?Diet, exercise, and sleep habits. ?Work and work Statistician. ?Sunscreen use. ?Safety issues such as seatbelt and bike helmet use. ?Physical exam ?Your health care provider will check your: ?Height and weight. These may be used to calculate your BMI (body mass index). BMI is a measurement that tells if you are at a healthy weight. ?Waist circumference. This measures the distance around your waistline. This measurement also tells if you are at a healthy weight and may help predict your risk of certain diseases, such as type 2 diabetes and high blood pressure. ?Heart rate and blood pressure. ?Body temperature. ?Skin for abnormal spots. ?What immunizations do I need? ? ?Vaccines are usually given at various ages, according to a schedule. Your health care provider will recommend vaccines for you based on your age, medical history, and lifestyle or other factors, such as travel or where you work. ?What tests do I need? ?Screening ?Your health  care provider may recommend screening tests for certain conditions. This may include: ?Lipid and cholesterol levels. ?Diabetes screening. This is done by checking your blood sugar (glucose) after you have not eaten for a while (fasting). ?Pelvic exam and Pap test. ?Hepatitis B test. ?Hepatitis C test. ?HIV (human immunodeficiency virus) test. ?STI (sexually transmitted infection) testing, if you are at risk. ?Lung cancer screening. ?Colorectal cancer screening. ?Mammogram. Talk with your health care provider about when you should start having regular mammograms. This may depend on whether you have a family history of breast cancer. ?BRCA-related cancer screening. This may be done if you have a family history of breast, ovarian, tubal, or peritoneal cancers. ?Bone density scan. This is done to screen for osteoporosis. ?Talk with your health care provider about your test results, treatment options, and if necessary, the need for more tests. ?Follow these instructions at home: ?Eating and drinking ? ?Eat a diet that includes fresh fruits and vegetables, whole grains, lean protein, and low-fat dairy products. ?Take vitamin and mineral supplements as recommended by your health care provider. ?Do not drink alcohol if: ?Your health care provider tells you not to drink. ?You are pregnant, may be pregnant, or are planning to become pregnant. ?If you drink alcohol: ?Limit how much you have to 0-1 drink a day. ?Know how much alcohol is in your drink. In the [place] one drink equals one 12 oz bottle of beer (355 mL), one 5 oz glass of wine (148 mL), or one 1? oz glass of hard liquor (44 mL). ?Lifestyle ?Brush your teeth every morning and night with fluoride toothpaste. Floss one time each day. ?Exercise for at least 30 minutes 5  or more days each week. ?Do not use any products that contain nicotine or tobacco. These products include cigarettes, chewing tobacco, and vaping devices, such as e-cigarettes. If you need help quitting,  ask your health care provider. ?Do not use drugs. ?If you are sexually active, practice safe sex. Use a condom or other form of protection to prevent STIs. ?If you do not wish to become pregnant, use a form of birth control. If you plan to become pregnant, see your health care provider for a prepregnancy visit. ?Take aspirin only as told by your health care provider. Make sure that you understand how much to take and what form to take. Work with your health care provider to find out whether it is safe and beneficial for you to take aspirin daily. ?Find healthy ways to manage stress, such as: ?Meditation, yoga, or listening to music. ?Journaling. ?Talking to a trusted person. ?Spending time with friends and family. ?Minimize exposure to UV radiation to reduce your risk of skin cancer. ?Safety ?Always wear your seat belt while driving or riding in a vehicle. ?Do not drive: ?If you have been drinking alcohol. Do not ride with someone who has been drinking. ?When you are tired or distracted. ?While texting. ?If you have been using any mind-altering substances or drugs. ?Wear a helmet and other protective equipment during sports activities. ?If you have firearms in your house, make sure you follow all gun safety procedures. ?Seek help if you have been physically or sexually abused. ?What's next? ?Visit your health care provider once a year for an annual wellness visit. ?Ask your health care provider how often you should have your eyes and teeth checked. ?Stay up to date on all vaccines. ?This information is not intended to replace advice given to you by your health care provider. Make sure you discuss any questions you have with your health care provider. ?Document Revised: 08/09/2020 Document Reviewed: 08/09/2020 ?Elsevier Patient Education ? [place]. ? ?

## 2021-06-26 NOTE — Assessment & Plan Note (Signed)
Declines second Shingrix vaccine. Tetanus UTD. ?Mammogram UTD. ?Colonoscopy UTD, due 2029. ? ?Discussed the importance of a healthy diet and regular exercise in order for weight loss, and to reduce the risk of further co-morbidity. ? ?Exam today stable ?Labs pending. ?

## 2021-06-26 NOTE — Assessment & Plan Note (Signed)
Following with neurology. ?Overall stable. ? ?Continue gabapentin 600 mg BID PRN. ?

## 2021-06-26 NOTE — Progress Notes (Signed)
? ?Subjective:  ? ? Patient ID: Ashley [name], female    DOB: 1966-01-30, 56 y.o.   MRN: [id number] ? ?HPI ? ?[name] is a very pleasant 56 y.o. female who presents today for complete physical and follow up of chronic conditions. ? ?She continues to struggle with anxiety and depression symptoms including tearfulness, difficulty sleeping. Recently switched to a new therapist as she didn't feel as though she was improving. Previously managed on citalopram 20 mg daily which was initiated about 6 weeks ago, she took for a few days but stopped as "it made me feel funny". Currently on medical leave which has helped some, but feels that she needs to extend her leave through May 31st with a return to work date of June 1st.  ? ?Immunizations: ?-Tetanus: 2021 ?-Influenza: Did not complete this season  ?-Covid-19: 2 vaccines  ?-Shingles: Completed one dose, has not completed second dose, declines today. ? ?Diet: Fair diet.  ?Exercise: No regular exercise. ? ?Eye exam: Completes annually  ?Dental exam: Completes semi-annually  ? ?Pap Smear: Hysterectomy ?Mammogram: Completed in November 2022 ?Colonoscopy: Completed in 2019, due 2029 ? ?BP Readings from Last 3 Encounters:  ?06/26/21 134/80  ?06/22/21 (!) 132/94  ?05/17/21 130/76  ? ? ? ? ? ? ? ?Review of Systems  ?Constitutional:  Negative for unexpected weight change.  ?HENT:  Negative for rhinorrhea.   ?Respiratory:  Negative for cough and shortness of breath.   ?Cardiovascular:  Negative for chest pain.  ?Gastrointestinal:  Negative for constipation and diarrhea.  ?Genitourinary:  Negative for difficulty urinating.  ?Musculoskeletal:  Positive for arthralgias and back pain.  ?Skin:  Negative for rash.  ?Allergic/Immunologic: Negative for environmental allergies.  ?Neurological:  Negative for dizziness and headaches.  ?Psychiatric/Behavioral:  The patient is nervous/anxious.   ? ?   ? ? ?Past Medical History:  ?Diagnosis Date  ? Anemia   ? Bronchitis   ?  2 years ago   ? [place] arthritis 2019  ? base of left thumb   ? GERD (gastroesophageal reflux disease)   ? diet controlled , no meds  ? Hydrosalpinx   ? bilateral   ? Hyperlipidemia   ? recent dx - currently diet control - no meds  ? Hypertension   ? Hypothyroid 2003  ? radio active iodine - no meds  ? Left-sided weakness 02/12/2019  ? Ovarian cyst, left 02/23/2016  ? SVD (spontaneous vaginal delivery)   ? x 1  ? Urinary frequency 11/30/2018  ? Vitamin B 12 deficiency   ? ? ?Social History  ? ?Socioeconomic History  ? Marital status: Single  ?  Spouse name: Not on file  ? Number of children: Not on file  ? Years of education: Not on file  ? Highest education level: Not on file  ?Occupational History  ? Not on file  ?Tobacco Use  ? Smoking status: Never  ? Smokeless tobacco: Never  ?Vaping Use  ? Vaping Use: Never used  ?Substance and Sexual Activity  ? Alcohol use: No  ? Drug use: No  ? Sexual activity: Yes  ?  Partners: Male  ?  Birth control/protection: Surgical  ?  Comment: hysterectomy.  ?Other Topics Concern  ? Not on file  ?Social History Narrative  ? Single.  ? 3 children.  ? Works at [employer] as a Designer, multimedia.  ? Enjoys relaxing, exercise, walking her dog.   ? ?Social Determinants of Health  ? ?Financial Resource Strain: Not on file  ?  Food Insecurity: Not on file  ?Transportation Needs: Not on file  ?Physical Activity: Not on file  ?Stress: Not on file  ?Social Connections: Not on file  ?Intimate Partner Violence: Not on file  ? ? ?Past Surgical History:  ?Procedure Laterality Date  ? CESAREAN SECTION    ? x 2  ? CHOLECYSTECTOMY  2008  ? DILATION AND CURETTAGE OF UTERUS    ? X 2  ? LAPAROSCOPIC BILATERAL SALPINGECTOMY Bilateral 07/29/2017  ? Procedure: LAPAROSCOPIC LEFT SALPINGOOPHRECTOMY: Partial right Salpingectomy;  Surgeon: Lavonia Drafts, MD;  Location: [facility];  Service: Gynecology;  Laterality: Bilateral;  ? SHOULDER SURGERY  2016  ? arthroscopic  ? TOTAL ABDOMINAL HYSTERECTOMY  2006  ?  Fibroids, benign pathology; partial hysterectomy per patient   ? TUBAL LIGATION    ? WISDOM TOOTH EXTRACTION    ? at age 88 yr  ? WRIST ARTHROSCOPY Right 2007  ? ? ?Family History  ?Problem Relation Age of Onset  ? Hypertension Mother   ? Other Father   ?     unknown medical history  ? Hypertension Maternal Grandmother   ? Heart failure Maternal Grandmother   ? Lung cancer Maternal Grandmother   ? Breast cancer Maternal Aunt   ? Colon cancer Neg Hx   ? Colon polyps Neg Hx   ? Rectal cancer Neg Hx   ? Stomach cancer Neg Hx   ? ? ?Allergies  ?Allergen Reactions  ? Levaquin [Levofloxacin]   ?  Presumed cause of joint aches  ? ? ?Current Outpatient Medications on File Prior to Visit  ?Medication Sig Dispense Refill  ? amLODipine (NORVASC) 5 MG tablet Take 1 tablet (5 mg total) by mouth daily. For blood pressure. 90 tablet 0  ? aspirin 81 MG EC tablet Take by mouth.    ? atorvastatin (LIPITOR) 20 MG tablet Take 1 tablet (20 mg total) by mouth daily. For cholesterol. 90 tablet 1  ? azelastine (ASTELIN) 0.1 % nasal spray Place into the nose.    ? citalopram (CELEXA) 20 MG tablet Take 20 mg by mouth daily.    ? famotidine (PEPCID) 20 MG tablet Take 1 tablet (20 mg total) by mouth daily.    ? fluticasone (FLONASE) 50 MCG/ACT nasal spray Place 1 spray into both nostrils 2 (two) times daily. 16 g 2  ? gabapentin (NEURONTIN) 600 MG tablet Take 1,200 mg by mouth daily.    ? nitrofurantoin, macrocrystal-monohydrate, (MACROBID) 100 MG capsule Take 1 capsule (100 mg total) by mouth 2 (two) times daily. 10 capsule 0  ? phenazopyridine (PYRIDIUM) 200 MG tablet Take 1 tablet (200 mg total) by mouth 3 (three) times daily. 6 tablet 0  ? ?No current facility-administered medications on file prior to visit.  ? ? ?BP 134/80   Pulse (!) 104   Ht '5\' 6"'$  (1.676 m)   Wt 176 lb (79.8 kg)   SpO2 97%   BMI 28.41 kg/m?  ?Objective:  ? Physical Exam ?HENT:  ?   Right Ear: Tympanic membrane and ear canal normal.  ?   Left Ear: Tympanic membrane  and ear canal normal.  ?   Nose: Nose normal.  ?Eyes:  ?   Conjunctiva/sclera: Conjunctivae normal.  ?   Pupils: Pupils are equal, round, and reactive to light.  ?Neck:  ?   Thyroid: No thyromegaly.  ?Cardiovascular:  ?   Rate and Rhythm: Normal rate and regular rhythm.  ?   Heart sounds: No murmur heard. ?Pulmonary:  ?  Effort: Pulmonary effort is normal.  ?   Breath sounds: Normal breath sounds. No rales.  ?Abdominal:  ?   General: Bowel sounds are normal.  ?   Palpations: Abdomen is soft.  ?   Tenderness: There is no abdominal tenderness.  ?Musculoskeletal:     ?   General: Normal range of motion.  ?   Cervical back: Neck supple.  ?Lymphadenopathy:  ?   Cervical: No cervical adenopathy.  ?Skin: ?   General: Skin is warm and dry.  ?   Findings: No rash.  ?Neurological:  ?   Mental Status: She is alert and oriented to person, place, and time.  ?   Cranial Nerves: No cranial nerve deficit.  ?   Deep Tendon Reflexes: Reflexes are normal and symmetric.  ?Psychiatric:     ?   Mood and Affect: Mood normal.  ? ? ? ? ? ?   ?Assessment & Plan:  ? ? ? ? ?This visit occurred during the SARS-CoV-2 public health emergency.  Safety protocols were in place, including screening questions prior to the visit, additional usage of staff PPE, and extensive cleaning of exam room while observing appropriate contact time as indicated for disinfecting solutions.  ?

## 2021-06-26 NOTE — Assessment & Plan Note (Signed)
Infrequent outbreaks. ? ?Continue Valtrex 1000 mg PRN. ?

## 2021-06-26 NOTE — Assessment & Plan Note (Signed)
Stable.  Continue amlodipine 5mg daily

## 2021-06-26 NOTE — Assessment & Plan Note (Signed)
Myalgias with full atorvastatin 20 mg dose. ? ?Will reduce to atorvastatin 10 mg daily. ?New Rx sent to pharmacy. ?

## 2021-06-26 NOTE — Assessment & Plan Note (Signed)
No new symptoms. ? ?Unfortunately intolerant to several statins, can tolerate atorvastatin 10 mg, new prescription sent to pharmacy.  ? ?Work on lipid and BP control. ?Continue to monitor. ?

## 2021-06-26 NOTE — Assessment & Plan Note (Signed)
Improved and intermittent, she is aware of triggers. ? ?Continue famotidine 20 mg daily. ?Continue to monitor.  ?

## 2021-06-27 ENCOUNTER — Encounter: Payer: Self-pay | Admitting: Primary Care

## 2021-06-27 NOTE — Telephone Encounter (Signed)
Type of forms received:FMLA ? ?Routed JH:[id number] T ? ?Paperwork received by : Gwynn Burly ? ? ?Individual made aware of 3-5 business day turn around (Y/N): Y ? ?Form completed and patient made aware of charges(Y/N): Y ? ? ?Faxed to :  ? ?Form location: Place in mail Box ? ?

## 2021-06-28 NOTE — Telephone Encounter (Signed)
Faxed [employer] Form to [place] received confirmation that fax when though.  ?

## 2021-06-28 NOTE — Telephone Encounter (Signed)
I just completed forms that were found in my inbox from [employer]. ? ?The forms were dated 06/15/2021 which was while I was out of the office. ? ?Completed forms have been placed in Joellen's inbox for faxing. ?

## 2021-06-28 NOTE — Progress Notes (Signed)
FMLA paperwork completed and placed in Ashley [name]'s inbox for faxing. ?

## 2021-06-28 NOTE — Telephone Encounter (Signed)
Faxed [employer] forms to [employer] and pt has pick up form from the other day. ?

## 2021-06-29 ENCOUNTER — Other Ambulatory Visit: Payer: Self-pay | Admitting: Primary Care

## 2021-06-29 ENCOUNTER — Encounter: Payer: Self-pay | Admitting: Intensive Care

## 2021-06-29 ENCOUNTER — Other Ambulatory Visit: Payer: Self-pay

## 2021-06-29 ENCOUNTER — Emergency Department: Payer: Managed Care, Other (non HMO)

## 2021-06-29 ENCOUNTER — Encounter: Payer: Self-pay | Admitting: Family

## 2021-06-29 ENCOUNTER — Emergency Department
Admission: EM | Admit: 2021-06-29 | Discharge: 2021-06-29 | Disposition: A | Discharge status: Home / Self Care | Payer: Managed Care, Other (non HMO) | Attending: Emergency Medicine | Admitting: Emergency Medicine

## 2021-06-29 ENCOUNTER — Ambulatory Visit: Payer: Managed Care, Other (non HMO) | Admitting: Family

## 2021-06-29 VITALS — BP 122/74 | HR 92 | Temp 99.6°F | Resp 16 | Ht 66.0 in | Wt 174.1 lb

## 2021-06-29 DIAGNOSIS — R1031 Right lower quadrant pain: Secondary | ICD-10-CM | POA: Diagnosis not present

## 2021-06-29 DIAGNOSIS — I10 Essential (primary) hypertension: Secondary | ICD-10-CM | POA: Insufficient documentation

## 2021-06-29 DIAGNOSIS — R1032 Left lower quadrant pain: Secondary | ICD-10-CM | POA: Diagnosis not present

## 2021-06-29 DIAGNOSIS — K5732 Diverticulitis of large intestine without perforation or abscess without bleeding: Secondary | ICD-10-CM | POA: Insufficient documentation

## 2021-06-29 DIAGNOSIS — K5900 Constipation, unspecified: Secondary | ICD-10-CM | POA: Insufficient documentation

## 2021-06-29 DIAGNOSIS — K5792 Diverticulitis of intestine, part unspecified, without perforation or abscess without bleeding: Secondary | ICD-10-CM

## 2021-06-29 DIAGNOSIS — R350 Frequency of micturition: Secondary | ICD-10-CM | POA: Insufficient documentation

## 2021-06-29 DIAGNOSIS — R102 Pelvic and perineal pain: Secondary | ICD-10-CM | POA: Diagnosis present

## 2021-06-29 DIAGNOSIS — R109 Unspecified abdominal pain: Secondary | ICD-10-CM

## 2021-06-29 DIAGNOSIS — Z8719 Personal history of other diseases of the digestive system: Secondary | ICD-10-CM | POA: Insufficient documentation

## 2021-06-29 DIAGNOSIS — M549 Dorsalgia, unspecified: Secondary | ICD-10-CM | POA: Insufficient documentation

## 2021-06-29 DIAGNOSIS — N3000 Acute cystitis without hematuria: Secondary | ICD-10-CM

## 2021-06-29 HISTORY — DX: Unspecified abdominal pain: R10.9

## 2021-06-29 LAB — COMPREHENSIVE METABOLIC PANEL
ALT: 21 U/L (ref 0–44)
AST: 31 U/L (ref 15–41)
Albumin: 4.4 g/dL (ref 3.5–5.0)
Alkaline Phosphatase: 106 U/L (ref 38–126)
Anion gap: 10 (ref 5–15)
BUN: 11 mg/dL (ref 6–20)
CO2: 25 mmol/L (ref 22–32)
Calcium: 10 mg/dL (ref 8.9–10.3)
Chloride: 104 mmol/L (ref 98–111)
Creatinine, Ser: 0.78 mg/dL (ref 0.44–1.00)
GFR, Estimated: >60 mL/min (ref >60)
Glucose, Bld: 105 mg/dL — ABNORMAL HIGH (ref 70–99)
Potassium: 5 mmol/L (ref 3.5–5.1)
Sodium: 139 mmol/L (ref 135–145)
Total Bilirubin: 0.6 mg/dL (ref 0.3–1.2)
Total Protein: 8.2 g/dL — ABNORMAL HIGH (ref 6.5–8.1)

## 2021-06-29 LAB — CBC WITH DIFFERENTIAL/PLATELET
Abs Immature Granulocytes: 0.01 10*3/uL (ref 0.00–0.07)
Basophils Absolute: 0 10*3/uL (ref 0.0–0.1)
Basophils Relative: 1 %
Eosinophils Absolute: 0 10*3/uL (ref 0.0–0.5)
Eosinophils Relative: 1 %
HCT: 42.5 % (ref 36.0–46.0)
Hemoglobin: 13.5 g/dL (ref 12.0–15.0)
Immature Granulocytes: 0 %
Lymphocytes Relative: 41 %
Lymphs Abs: 1.7 10*3/uL (ref 0.7–4.0)
MCH: 27.1 pg (ref 26.0–34.0)
MCHC: 31.8 g/dL (ref 30.0–36.0)
MCV: 85.3 fL (ref 80.0–100.0)
Monocytes Absolute: 0.3 10*3/uL (ref 0.1–1.0)
Monocytes Relative: 8 %
Neutro Abs: 2 10*3/uL (ref 1.7–7.7)
Neutrophils Relative %: 49 %
Platelets: 260 10*3/uL (ref 150–400)
RBC: 4.98 MIL/uL (ref 3.87–5.11)
RDW: 14.6 % (ref 11.5–15.5)
WBC: 4.1 10*3/uL (ref 4.0–10.5)
nRBC: 0 % (ref 0.0–0.2)

## 2021-06-29 LAB — URINALYSIS, ROUTINE W REFLEX MICROSCOPIC
Bilirubin Urine: NEGATIVE
Glucose, UA: NEGATIVE mg/dL
Hgb urine dipstick: NEGATIVE
Ketones, ur: NEGATIVE mg/dL
Leukocytes,Ua: NEGATIVE
Nitrite: NEGATIVE
Protein, ur: NEGATIVE mg/dL
Specific Gravity, Urine: 1.004 — ABNORMAL LOW (ref 1.005–1.030)
pH: 6 (ref 5.0–8.0)

## 2021-06-29 MED ORDER — HYDROCODONE-ACETAMINOPHEN 5-325 MG PO TABS: 1.0000 | ORAL_TABLET | Freq: Four times a day (QID) | ORAL | 0 refills | Status: DC | PRN

## 2021-06-29 MED ORDER — METRONIDAZOLE 500 MG PO TABS: 500.0000 mg | ORAL_TABLET | Freq: Two times a day (BID) | ORAL | 0 refills | Status: DC

## 2021-06-29 MED ORDER — CIPROFLOXACIN HCL 500 MG PO TABS: 500.0000 mg | ORAL_TABLET | Freq: Two times a day (BID) | ORAL | 0 refills | Status: AC

## 2021-06-29 NOTE — ED Notes (Signed)
PT provided with dc ppw. Pr questions answered.  Pt follow up and rx information reviewed. Pt declines vs at dc. Pt provides verbal consent for dc and is ambulatory to lobby on foot. ?

## 2021-06-29 NOTE — Telephone Encounter (Signed)
I spoke with pt who was seen [facility] on 06/22/21 with abd pain and urinary symptoms and pt has one more day of abx to take but has finished pyridium. Pt said was seen on 06/26/21 for annual but did not mention still having abd and back pain because pt did not think med had enough time to work. Today pt said rt lower abd pain that is dull pain on and off but does hurt worse when has to urinate. Pt also  has mid lower abd pain that is dull pain that comes and goes. Pt has dull lower back pain that goes all the way across her lower back on and off. Pts pain level now is 6. Pt said when seen on 06/22/21 pain level then was 10 but pt thought her pain level would be much better at this point. Pt is out of pyridium now but the med did help while taking it. No fever, no N&V, no diarrhea; pt had been constipated but pt took laxative and had good BM this morning. No blood seen in urine or BM. Pt does not have burning or pain upon urination but pt does have frequency and a feeling of pressure when urinating. Pt is voiding good amt each time. Pt requested appt and scheduled with T Dugal FNP 06/29/21 at 11:00 am. UC & ED precautions given and pt voiced understanding. Sending note to Red Christians FNP. ?

## 2021-06-29 NOTE — Assessment & Plan Note (Signed)
Reviewed 4/28 urine culture and urinalysis.  ?On presentation very tender CVA right side  ?Concerned about ddx kidney stone, pyelenephritis (with low grade temp) increasing urinary frequency.  ?Pt advised to go to ER. Pt is stable upon leaving, states she will drive now to [facility] ?

## 2021-06-29 NOTE — Assessment & Plan Note (Signed)
Positive obturator postive rovsings ?ddx appendicitis which would result in decision regarding emergency major surgery, acute illness poses threat to life or bodily function if were determined to be appendicitis ?Pt advised to go to ER. Pt is stable upon leaving, states she will drive now to [facility] ?

## 2021-06-29 NOTE — Assessment & Plan Note (Signed)
ddx diverticulitis ?Most painful area of palpation ?Urgent CT needed ?Pt advised to go to ER. Pt is stable upon leaving, states she will drive now to [facility] ?

## 2021-06-29 NOTE — Progress Notes (Signed)
[facility] ? ?Established Patient Office Visit ? ?Subjective:  ?Patient ID: Ashley [name], female    DOB: 05/21/1965  Age: 56 y.o. MRN: [id number] ? ?CC:  ?Chief Complaint  ?Patient presents with  ? Abdominal Pain  ? Flank Pain  ?  Right side   ? ? ?HPI ?Ashley [name] is here today with concerns.  ? ?Seen a week ago at urgent care.  ?Positive UTI, given RX for macrobid.  ? ?Still with ongoing lower pelvic pain, intense pain when going to urinate. Constant pain right lower abdomen with radiation to back as well as midline low back pain that is intermittent, sharp and stabbing when it does come and go. Pain currently 10/10, hard to even sit due to pain.  ? ?Some nausea.  ?No vomiting. ?No fever, temp 99.6 F ?No chills ? Urine culture positive 4/28 for lactobacillus ? ?Also c/o two day constipation, prior to last night taking laxative with slight relief.  ? ?Lab Results  ?Component Value Date  ? WBC 3.5 (L) 06/26/2021  ? HGB 13.6 06/26/2021  ? HCT 42.5 06/26/2021  ? MCV 85.9 06/26/2021  ? PLT 240.0 06/26/2021  ? ? ? ?Past Medical History:  ?Diagnosis Date  ? Abdominal pain 06/23/2018  ? Acute midline low back pain without sciatica 09/10/2019  ? Adverse effect of drug 08/15/2019  ? Anemia   ? Bronchitis   ? 2 years ago   ? [place] arthritis 2019  ? base of left thumb   ? Constipation 04/07/2019  ? Discoloration of skin 05/17/2021  ? GERD (gastroesophageal reflux disease)   ? diet controlled , no meds  ? Hydrosalpinx   ? bilateral   ? Hyperlipidemia   ? recent dx - currently diet control - no meds  ? Hypertension   ? Hypothyroid 2003  ? radio active iodine - no meds  ? Left-sided weakness 02/12/2019  ? Ovarian cyst, left 02/23/2016  ? S/P shoulder surgery 02/28/2015  ? SVD (spontaneous vaginal delivery)   ? x 1  ? Urinary frequency 11/30/2018  ? Vitamin B 12 deficiency   ? ? ?Past Surgical History:  ?Procedure Laterality Date  ? CESAREAN SECTION    ? x 2  ? CHOLECYSTECTOMY  2008  ? DILATION AND CURETTAGE OF UTERUS    ? X  2  ? LAPAROSCOPIC BILATERAL SALPINGECTOMY Bilateral 07/29/2017  ? Procedure: LAPAROSCOPIC LEFT SALPINGOOPHRECTOMY: Partial right Salpingectomy;  Surgeon: Lavonia Drafts, MD;  Location: [facility];  Service: Gynecology;  Laterality: Bilateral;  ? SHOULDER SURGERY  2016  ? arthroscopic  ? TOTAL ABDOMINAL HYSTERECTOMY  2006  ? Fibroids, benign pathology; partial hysterectomy per patient   ? TUBAL LIGATION    ? WISDOM TOOTH EXTRACTION    ? at age 76 yr  ? WRIST ARTHROSCOPY Right 2007  ? ? ?Family History  ?Problem Relation Age of Onset  ? Hypertension Mother   ? Other Father   ?     unknown medical history  ? Hypertension Maternal Grandmother   ? Heart failure Maternal Grandmother   ? Lung cancer Maternal Grandmother   ? Breast cancer Maternal Aunt   ? Colon cancer Neg Hx   ? Colon polyps Neg Hx   ? Rectal cancer Neg Hx   ? Stomach cancer Neg Hx   ? ? ?Social History  ? ?Socioeconomic History  ? Marital status: Single  ?  Spouse name: Not on file  ? Number of children: Not on file  ?  Years of education: Not on file  ? Highest education level: Not on file  ?Occupational History  ? Not on file  ?Tobacco Use  ? Smoking status: Never  ? Smokeless tobacco: Never  ?Vaping Use  ? Vaping Use: Never used  ?Substance and Sexual Activity  ? Alcohol use: No  ? Drug use: No  ? Sexual activity: Yes  ?  Partners: Male  ?  Birth control/protection: Surgical  ?  Comment: hysterectomy.  ?Other Topics Concern  ? Not on file  ?Social History Narrative  ? Single.  ? 3 children.  ? Works at [employer] as a Designer, multimedia.  ? Enjoys relaxing, exercise, walking her dog.   ? ?Social Determinants of Health  ? ?Financial Resource Strain: Not on file  ?Food Insecurity: Not on file  ?Transportation Needs: Not on file  ?Physical Activity: Not on file  ?Stress: Not on file  ?Social Connections: Not on file  ?Intimate Partner Violence: Not on file  ? ? ?Outpatient Medications Prior to Visit  ?Medication Sig Dispense Refill  ? amLODipine  (NORVASC) 5 MG tablet Take 1 tablet (5 mg total) by mouth daily. For blood pressure. 90 tablet 0  ? aspirin 81 MG EC tablet Take by mouth.    ? atorvastatin (LIPITOR) 10 MG tablet Take 1 tablet (10 mg total) by mouth daily. for cholesterol. 90 tablet 3  ? azelastine (ASTELIN) 0.1 % nasal spray Place into the nose.    ? famotidine (PEPCID) 20 MG tablet Take 1 tablet (20 mg total) by mouth daily.    ? fluticasone (FLONASE) 50 MCG/ACT nasal spray Place 1 spray into both nostrils 2 (two) times daily. 16 g 2  ? gabapentin (NEURONTIN) 600 MG tablet Take 1,200 mg by mouth daily.    ? nitrofurantoin, macrocrystal-monohydrate, (MACROBID) 100 MG capsule Take 1 capsule (100 mg total) by mouth 2 (two) times daily. 10 capsule 0  ? phenazopyridine (PYRIDIUM) 200 MG tablet Take 1 tablet (200 mg total) by mouth 3 (three) times daily. 6 tablet 0  ? sertraline (ZOLOFT) 25 MG tablet Take 1 tablet (25 mg total) by mouth daily. for anxiety and depression. 90 tablet 0  ? ?No facility-administered medications prior to visit.  ? ? ?Allergies  ?Allergen Reactions  ? Levaquin [Levofloxacin]   ?  Presumed cause of joint aches  ? ? ?ROS ?Review of Systems  ?Constitutional:  Positive for chills. Negative for activity change, appetite change, fatigue and fever (99.6 today).  ?Respiratory:  Negative for shortness of breath.   ?Cardiovascular:  Negative for chest pain, palpitations and leg swelling.  ?Gastrointestinal:  Positive for abdominal pain, constipation and nausea. Negative for anal bleeding, blood in stool, diarrhea and vomiting.  ?Genitourinary:  Positive for dysuria, flank pain (right flank pain), frequency (last night once every hour), pelvic pain (right lower pelvic pain radiating to back) and urgency. Negative for decreased urine volume, difficulty urinating, genital sores, hematuria, vaginal bleeding, vaginal discharge and vaginal pain.  ?Musculoskeletal:  Positive for back pain (midline low back pain).  ?Neurological:  Negative for  dizziness and light-headedness.  ?Psychiatric/Behavioral:  Negative for confusion.   ?All other systems reviewed and are negative. ? ?  ?Objective:  ?  ?Physical Exam ?Constitutional:   ?   General: She is not in acute distress. ?   Appearance: Normal appearance. She is well-developed and well-groomed. She is not ill-appearing, toxic-appearing or diaphoretic.  ?HENT:  ?   Mouth/Throat:  ?   Pharynx: No pharyngeal swelling.  ?  Tonsils: No tonsillar exudate.  ?Neck:  ?   Thyroid: No thyroid mass.  ?Pulmonary:  ?   Effort: Pulmonary effort is normal.  ?Abdominal:  ?   General: Bowel sounds are decreased.  ?   Palpations: Abdomen is soft.  ?   Tenderness: There is abdominal tenderness in the right upper quadrant, right lower quadrant and left lower quadrant. There is right CVA tenderness. Positive signs include Rovsing's sign, McBurney's sign and obturator sign. Negative signs include psoas sign.  ?   Hernia: No hernia is present.  ?   Comments: Ruq 8/10 pain ?Rlq 9/10 pain ?Llq 10/10 pain  ?Musculoskeletal:  ?   Lumbar back: Normal. No tenderness.  ?   Comments: No flank pain no CVA tenderness  ?Lymphadenopathy:  ?   Cervical:  ?   Right cervical: No superficial cervical adenopathy. ?   Left cervical: No superficial cervical adenopathy.  ?Neurological:  ?   General: No focal deficit present.  ?   Mental Status: She is alert and oriented to person, place, and time. Mental status is at baseline.  ?Psychiatric:     ?   Mood and Affect: Mood normal.     ?   Behavior: Behavior normal.     ?   Thought Content: Thought content normal.     ?   Judgment: Judgment normal.  ? ? ?BP 122/74   Pulse 92   Temp 99.6 ?F (37.6 ?C)   Resp 16   Ht '5\' 6"'$  (1.676 m)   Wt 174 lb 2 oz (79 kg)   SpO2 97%   BMI 28.10 kg/m?  ?Wt Readings from Last 3 Encounters:  ?06/29/21 174 lb 2 oz (79 kg)  ?06/26/21 176 lb (79.8 kg)  ?06/22/21 172 lb (78 kg)  ? ? ? ?There are no preventive care reminders to display for this patient. ? ?There are no  preventive care reminders to display for this patient. ? ?Lab Results  ?Component Value Date  ? TSH 1.88 10/05/2020  ? ?Lab Results  ?Component Value Date  ? WBC 3.5 (L) 06/26/2021  ? HGB 13.6 06/26/2021  ? HCT 42.

## 2021-06-29 NOTE — Discharge Instructions (Signed)
Follow-up with your primary care provider if any continued problems or concerns.  Medication was sent to your pharmacy which consist of 2 antibiotics and 1 pain medication as we discussed.  If any severe worsening of your symptoms over the weekend such as nausea, vomiting, inability to take the antibiotics or fever return to the emergency department. ?

## 2021-06-29 NOTE — ED Provider Notes (Signed)
? ?[facility] ?Provider Note ? ? ? Event Date/Time  ? First MD Initiated Contact with Patient 06/29/21 1333   ?  (approximate) ? ? ?History  ? ?Pelvic Pain and Back Pain ? ? ?HPI ? ?Ashley [name] is a 56 y.o. female   presents to the ED with complaint of lower right-sided back and pelvic pain.  Patient states she was diagnosed with a UTI and has almost finished the antibiotics but the symptoms are still there.  She was seen in urgent care and placed on Macrodantin which she is taking consistently.  Patient denies any dysuria, urinary frequency or vaginal discharge.  There is no history of kidney stones.  She denies any nausea, vomiting or diarrhea.  Patient does report a low-grade fever at home.  She has a history of urinary tract infections, abdominal pain, bronchitis, hypertension, GERD, osteoarthritis, history of TIA. ? ?  ? ? ?Physical Exam  ? ?Triage Vital Signs: ?ED Triage Vitals  ?Enc Vitals Group  ?   BP 06/29/21 1153 (!) 167/93  ?   Pulse Rate 06/29/21 1153 88  ?   Resp 06/29/21 1153 20  ?   Temp 06/29/21 1153 99.5 ?F (37.5 ?C)  ?   Temp Source 06/29/21 1153 Oral  ?   SpO2 06/29/21 1153 99 %  ?   Weight 06/29/21 1153 174 lb (78.9 kg)  ?   Height 06/29/21 1153 '5\' 6"'$  (1.676 m)  ?   Head Circumference --   ?   Peak Flow --   ?   Pain Score 06/29/21 1202 6  ?   Pain Loc --   ?   Pain Edu? --   ?   Excl. in [place]? --   ? ? ?Most recent vital signs: ?Vitals:  ? 06/29/21 1153 06/29/21 1345  ?BP: (!) 167/93 (!) 160/90  ?Pulse: 88 86  ?Resp: 20 18  ?Temp: 99.5 ?F (37.5 ?C)   ?SpO2: 99% 99%  ? ? ? ?General: Awake, no distress.  Patient does appear to be uncomfortable but continues to ambulate without any assistance. ?CV:  Good peripheral perfusion.  Heart regular rate and rhythm. ?Resp:  Normal effort.  Lungs are clear bilaterally. ?Abd:  No distention.  Soft, flat, bowel sounds are present x4 quadrants.  There is generalized tenderness on palpation but more consistently in the left lower  quadrant and suprapubic area. ?Other:   ? ? ?ED Results / Procedures / Treatments  ? ?Labs ?(all labs ordered are listed, but only abnormal results are displayed) ?Labs Reviewed  ?COMPREHENSIVE METABOLIC PANEL - Abnormal; Notable for the following components:  ?    Result Value  ? Glucose, Bld 105 (*)   ? Total Protein 8.2 (*)   ? All other components within normal limits  ?URINALYSIS, ROUTINE W REFLEX MICROSCOPIC - Abnormal; Notable for the following components:  ? Color, Urine STRAW (*)   ? APPearance CLEAR (*)   ? Specific Gravity, Urine 1.004 (*)   ? All other components within normal limits  ?CBC WITH DIFFERENTIAL/PLATELET  ? ? ? ?RADIOLOGY ? ?CT renal study did not show any kidney stones but does show inflammation of the proximal sigmoid colon suggesting a mild diverticulitis. ? ? ?PROCEDURES: ? ?Critical Care performed:  ? ?Procedures ? ? ?MEDICATIONS ORDERED IN ED: ?Medications - No data to display ? ? ?IMPRESSION / MDM / ASSESSMENT AND PLAN / ED COURSE  ?I reviewed the triage vital signs and the nursing notes. ? ? ?  Differential diagnosis includes, but is not limited to, urinary tract infection, bacterial vaginosis, kidney stone, generalized abdominal pain. ? ? ?56 year old female presents to the ED with complaint of right sided flank pain that radiates into her lower abdomen.  She was recently seen at urgent care where she was treated for urinary tract infection with Macrodantin which she is almost completely finished and states that she still continues to have symptoms.  A urinalysis was done there along with a wet prep which was reviewed and was negative.  Urinalysis today is negative, CBC is unremarkable, C MP shows a glucose of 105 but otherwise unremarkable.  A CT renal study was done and did not show any stones but did show mild diverticulitis of the proximal sigmoid colon.  This was discussed with patient and she reports that she has been told that she has some intestinal problems that sounded  similar to diverticulitis.  Patient has taken Cipro in the past without any difficulty.  Cipro, Flagyl and hydrocodone was sent to her pharmacy.  She was encouraged to return to the emergency department if there is any severe worsening of her symptoms over the weekend such as fever, increased pain, vomiting at which time she may require hospitalization. ? ? ?  ? ? ?FINAL CLINICAL IMPRESSION(S) / ED DIAGNOSES  ? ?Final diagnoses:  ?Diverticulitis  ? ? ? ?Rx / DC Orders  ? ?ED Discharge Orders   ? ?      Ordered  ?  HYDROcodone-acetaminophen (NORCO/VICODIN) 5-325 MG tablet  Every 6 hours PRN       ? 06/29/21 1541  ?  metroNIDAZOLE (FLAGYL) 500 MG tablet  2 times daily       ? 06/29/21 1541  ?  ciprofloxacin (CIPRO) 500 MG tablet  2 times daily       ? 06/29/21 1541  ? ?  ?  ? ?  ? ? ? ?Note:  This document was prepared using Dragon voice recognition software and may include unintentional dictation errors. ?  ?Johnn Hai, PA-C ?06/29/21 1554 ? ?  ?Blake Divine, MD ?06/29/21 1628 ? ?

## 2021-06-29 NOTE — Telephone Encounter (Signed)
Saw patient already, thank you for speaking with the patient.  ?Pt was sent to ER.  ?Please see note for further information.  ?

## 2021-06-29 NOTE — Assessment & Plan Note (Signed)
poct urine dip in office Urine culture ordered pending results  

## 2021-06-29 NOTE — ED Notes (Signed)
First Nurse Note:  Pt to ED via POV c/o abdominal and pelvic pain. Pt is currently in NAD.  ?

## 2021-06-29 NOTE — Addendum Note (Signed)
Addended by: Ellamae Sia on: 06/29/2021 11:56 AM ? ? Modules accepted: Orders ? ?

## 2021-06-29 NOTE — ED Triage Notes (Signed)
Patient reports she has been on an antibiotic for a UTI diagnosed on Monday and symptoms are not better. C/o right lower back/side pain and pelvic pain. Reports low grade fevers at home ?

## 2021-07-02 ENCOUNTER — Telehealth: Payer: Self-pay | Admitting: Gastroenterology

## 2021-07-02 NOTE — Telephone Encounter (Signed)
I spoke to pt and she is aware as instructed... Pt expressed understanding and wanted to be seen sooner for [facility] f/u---appt scheduled for after pt should be finished with treatment.... I went over ED precautions and pt expressed understanding ?

## 2021-07-02 NOTE — Telephone Encounter (Signed)
Please let the patient know that I am sorry that she is having the symptoms.  The time she saw me back in September she had a CT scan at that time that showed:  ? ?IMPRESSION:  ?1. No acute intra-abdominal or intrapelvic process.  ?2. Scattered colonic diverticulosis without diverticulitis.  ? ?Without any sign of diverticulitis at that time.  The most recent CT scan was reviewed and it showed possible mild noncomplicated diverticulitis which is an infection of the colon in that area.  The CT scan was done for possible kidney stones and just mention that there may be uncomplicated acute (which means recent infection and not chronic) diverticulitis.  The treatment for this is the antibiotic she is getting and it can take a few days for it to start working and her feeling better.  If she is getting worse then she would need to go back to the emergency room room since it is infection and there is nothing other than antibiotics that I can recommend for her.  If it gets very bad or has any complications then surgery can remove that part of her colon if necessary.  Just to reiterate the antibiotics have not had a chance to work with her taking only 2 days.  ?

## 2021-07-02 NOTE — Telephone Encounter (Signed)
Noted, agree need for re-evaluation if pain is improving.  ?

## 2021-07-02 NOTE — Telephone Encounter (Signed)
I spoke with pt; pt seen on 06/29/21 at [facility] ED with diverticulitis and was given 2 abx  and pain medication which pt started taking on 06/29/21.Marland Kitchen Pt said now the pain comes and goes but is the same pain level and intensity as when seen on 06/29/21; now pain level 6.5. no N&V& Diarrhea. Pt has not taken temp but pt said she does feel warm.pt has not taken hydrocodone so far today because makes pt sleepy and pt wants food on her stomach when taking pain med.  Pt said she my charted her GI last night but has not heard back about seeing GI. I advised to call GI office to see about being seen. No available appt at [facility] and per [facility] ED instructions if pt cannot be seen by GI she will go back to [facility] ED. Sending note to Gentry Fitz NP who is out of office today; Joellen CMA and Dr Einar Pheasant who is in office. ?

## 2021-07-02 NOTE — Telephone Encounter (Signed)
Patient was seen in ER, diagnosed with diverticulitis. Patient states she is currently in a lot of pain and needs some medical advice. Requesting a call back.  ?

## 2021-07-03 NOTE — Telephone Encounter (Signed)
Jasmine called about pt's disability extension paperwork for [employer]. Wants to know the status of it. She said it was faxed over to [place] on 06/28/2021, said if you cannot reach her to leave a voicemail.  ? ?Callback Number: [phone] ?

## 2021-07-10 ENCOUNTER — Other Ambulatory Visit: Payer: Managed Care, Other (non HMO)

## 2021-07-18 ENCOUNTER — Ambulatory Visit: Payer: Managed Care, Other (non HMO) | Admitting: Gastroenterology

## 2021-07-18 NOTE — Progress Notes (Unsigned)
Primary Care Physician: Pleas Koch, NP  Primary Gastroenterologist:  Dr. [name]  No chief complaint on file.   HPI: Ashley [name] is a 56 y.o. female here with a history of having a colonoscopy in 2019 by Dr. Christophe Louis.  The patient had seen me for some belly pain back in February and the pain at that time was consistent with abdominal wall muscular tenderness.  The patient had previously had CT scans that showed inflammation and possible colitis prior to my last visit with the patient in February.  The patient was seen earlier this month with a CT scan for abdominal pain and when trying to rule out kidney stones the patient had a CT scan that showed:  IMPRESSION: 1. No renal stones, renal inflammation or obstructive uropathy. 2. Possible faint pericolonic inflammation about the proximal sigmoid colon, suggesting mild acute diverticulitis. No perforation or abscess.   Past Medical History:  Diagnosis Date   Abdominal pain 06/23/2018   Acute midline low back pain without sciatica 09/10/2019   Adverse effect of drug 08/15/2019   Anemia    Bronchitis    2 years ago    [facility] arthritis 2019   base of left thumb    Constipation 04/07/2019   Discoloration of skin 05/17/2021   GERD (gastroesophageal reflux disease)    diet controlled , no meds   Hydrosalpinx    bilateral    Hyperlipidemia    recent dx - currently diet control - no meds   Hypertension    Hypothyroid 2003   radio active iodine - no meds   Left-sided weakness 02/12/2019   Ovarian cyst, left 02/23/2016   S/P shoulder surgery 02/28/2015   SVD (spontaneous vaginal delivery)    x 1   Urinary frequency 11/30/2018   Vitamin B 12 deficiency     Current Outpatient Medications  Medication Sig Dispense Refill   aspirin 81 MG EC tablet Take by mouth.     atorvastatin (LIPITOR) 10 MG tablet Take 1 tablet (10 mg total) by mouth daily. for cholesterol. 90 tablet 3   azelastine (ASTELIN) 0.1 % nasal spray  Place into the nose.     famotidine (PEPCID) 20 MG tablet Take 1 tablet (20 mg total) by mouth daily.     fluticasone (FLONASE) 50 MCG/ACT nasal spray Place 1 spray into both nostrils 2 (two) times daily. 16 g 2   gabapentin (NEURONTIN) 600 MG tablet Take 1,200 mg by mouth daily.     HYDROcodone-acetaminophen (NORCO/VICODIN) 5-325 MG tablet Take 1-2 tablets by mouth every 6 (six) hours as needed for moderate pain. 20 tablet 0   metroNIDAZOLE (FLAGYL) 500 MG tablet Take 1 tablet (500 mg total) by mouth 2 (two) times daily. 20 tablet 0   sertraline (ZOLOFT) 25 MG tablet Take 1 tablet (25 mg total) by mouth daily. for anxiety and depression. 90 tablet 0   No current facility-administered medications for this visit.    Allergies as of 07/18/2021 - Review Complete 06/29/2021  Allergen Reaction Noted   Levaquin [levofloxacin]  08/12/2019    ROS:  General: Negative for anorexia, weight loss, fever, chills, fatigue, weakness. ENT: Negative for hoarseness, difficulty swallowing , nasal congestion. CV: Negative for chest pain, angina, palpitations, dyspnea on exertion, peripheral edema.  Respiratory: Negative for dyspnea at rest, dyspnea on exertion, cough, sputum, wheezing.  GI: See history of present illness. GU:  Negative for dysuria, hematuria, urinary incontinence, urinary frequency, nocturnal urination.  Endo: Negative for unusual  weight change.    Physical Examination:   There were no vitals taken for this visit.  General: Well-nourished, well-developed in no acute distress.  Eyes: No icterus. Conjunctivae pink. Lungs: Clear to auscultation bilaterally. Non-labored. Heart: Regular rate and rhythm, no murmurs rubs or gallops.  Abdomen: Bowel sounds are normal, nontender, nondistended, no hepatosplenomegaly or masses, no abdominal bruits or hernia , no rebound or guarding.   Extremities: No lower extremity edema. No clubbing or deformities. Neuro: Alert and oriented x 3.  Grossly  intact. Skin: Warm and dry, no jaundice.   Psych: Alert and cooperative, normal mood and affect.  Labs:  ***  Imaging Studies: CT Renal Stone Study  Result Date: 06/29/2021 CLINICAL DATA:  Provided history: Flank pain, kidney stone suspected (pregnant) Technologist notes state patient reports right-sided back/flank and pelvic pain. On antibiotic for urinary tract infection. Low-grade fevers EXAM: CT ABDOMEN AND PELVIS WITHOUT CONTRAST TECHNIQUE: Multidetector CT imaging of the abdomen and pelvis was performed following the standard protocol without IV contrast. RADIATION DOSE REDUCTION: This exam was performed according to the departmental dose-optimization program which includes automated exposure control, adjustment of the mA and/or kV according to patient size and/or use of iterative reconstruction technique. COMPARISON:  Contrast enhanced CT 03/29/2021 FINDINGS: Lower chest: No basilar airspace disease or pleural effusion. Hepatobiliary: No focal liver abnormality is seen. Status post cholecystectomy. No biliary dilatation. Pancreas: No ductal dilatation or inflammation. Spleen: Normal in size without focal abnormality. Adrenals/Urinary Tract: No adrenal nodule. No hydronephrosis or renal calculi. No perinephric edema. No evidence of focal renal abnormality on this unenhanced exam. No perirenal collection. Urinary bladder is partially distended and unremarkable. No bladder wall thickening or perivesicular fat stranding. Stomach/Bowel: The stomach is nondistended. There is no small bowel obstruction or inflammation. Normal terminal ileum. The appendix is air-filled, prominent caliber of but no wall thickening or periappendiceal fat stranding. No appendicolith. No appendicitis. Small volume of colonic stool. Diverticulosis from the splenic flexure distally. There may be faint Peri diverticular inflammation about the proximal sigmoid in the central/right pelvis, series 5, image 40. Vascular/Lymphatic: Mild  aorto bi-iliac atherosclerosis. No aneurysm. No portal venous gas. No bulky abdominopelvic adenopathy. Reproductive: Status post hysterectomy. No adnexal masses. Other: Small fat containing umbilical hernia. No free air or ascites. Musculoskeletal: There are no acute or suspicious osseous abnormalities. Stable bone island in the right proximal femur. No abdominal wall soft tissue abnormalities. IMPRESSION: 1. No renal stones, renal inflammation or obstructive uropathy. 2. Possible faint pericolonic inflammation about the proximal sigmoid colon, suggesting mild acute diverticulitis. No perforation or abscess. Aortic Atherosclerosis (ICD10-I70.0). Electronically Signed   By: Keith Rake M.D.   On: 06/29/2021 15:13    Assessment and Plan:   Keeghan Mcintire is a 56 y.o. y/o female ***     [name], MD. Marval Regal    Note: This dictation was prepared with Dragon dictation along with smaller phrase technology. Any transcriptional errors that result from this process are unintentional.

## 2021-07-24 ENCOUNTER — Ambulatory Visit: Payer: Managed Care, Other (non HMO) | Admitting: Primary Care

## 2021-07-24 ENCOUNTER — Encounter: Payer: Self-pay | Admitting: Primary Care

## 2021-07-24 VITALS — BP 160/88 | HR 82 | Temp 98.3°F | Ht 66.0 in | Wt 174.0 lb

## 2021-07-24 DIAGNOSIS — I10 Essential (primary) hypertension: Secondary | ICD-10-CM | POA: Diagnosis not present

## 2021-07-24 DIAGNOSIS — Z8719 Personal history of other diseases of the digestive system: Secondary | ICD-10-CM | POA: Diagnosis not present

## 2021-07-24 DIAGNOSIS — F4323 Adjustment disorder with mixed anxiety and depressed mood: Secondary | ICD-10-CM

## 2021-07-24 DIAGNOSIS — N3000 Acute cystitis without hematuria: Secondary | ICD-10-CM | POA: Diagnosis not present

## 2021-07-24 MED ORDER — AMLODIPINE BESYLATE 5 MG PO TABS: 5.0000 mg | ORAL_TABLET | Freq: Every day | ORAL | 3 refills | Status: DC

## 2021-07-24 NOTE — Assessment & Plan Note (Signed)
Above goal today, however has been out of the amlodipine for several days.  Resume amlodipine 5 mg daily, new prescription sent to pharmacy.

## 2021-07-24 NOTE — Assessment & Plan Note (Signed)
Overall seems to be improving, unfortunately she is experienced a setback medically.  Agree to extend FMLA through June 6 with a return date of August 01, 2021. She will return part-time, 4 hours daily, for the first 2 weeks; then she will return to her normal full-time hours thereafter. Paperwork completed today.  Continue regular therapy appointments. She will update if she decides to resume sertraline 25 mg daily.  I did recommended she do so.

## 2021-07-24 NOTE — Assessment & Plan Note (Signed)
Recently evaluated and treated for diverticulitis.  Overall seems to be recovering.  Discussed to avoid spicy/irritative foods, nuts/seeds, popcorn.  We will also have her stop Metamucil cookies as it could be contributing to gas buildup.  Continue Colace regularly to avoid constipation.  Exam today stable. Follow-up with GI as scheduled.

## 2021-07-24 NOTE — Progress Notes (Signed)
Subjective:    Patient ID: Ashley [name], female    DOB: 23-May-1965, 56 y.o.   MRN: [id number]  HPI  Ashley [name] is a very pleasant 56 y.o. female with a history of anxiety depression, hypertension, TIA, chronic neck/shoulder/lower back pain who presents today for follow up of anxiety/depression. She is also needing refills of her amlodipine, she ran out a few days ago.   She was last evaluated by me on 06/26/2021 for her annual physical.  During this visit she endorsed intolerance to citalopram for anxiety and depression symptoms so we switched her to sertraline 25 mg daily.  She was also encouraged to follow-up with therapy regularly.  Her FMLA medical leave was extended through May 31st with a return to work date of July 26, 2021.   Today she brings paperwork with her as she is ready to return to work. She would like to push back her return date to August 01, 2021. She would like to return part time for the first 2 weeks which is 4 hours daily from mid to end of her shift.   Since her last visit with me she was diagnosed with diverticulitis and also had a systemic allergic reaction to a facial cream prescribed by her dermatologist.   She has completed her course of antibiotics for diverticulitis but has noticed a slight return in her right sided abdominal pain. She has been constipated, has noticed improvement in pain with a bowel movement. She's also increased consumption of bran foods and started eating metamucil cookies. She is still taking her prednisone taper prescribed by her dermatologist.   She has not taken Zoloft consistently given her recent bout of diverticulitis and her allergic reaction. She continues to notice anxiety and depression symptoms given her medical problems recently. Personally she's feeling some better. She continues to follow with her therapist, has an appointment scheduled for next week.   She is needing repeat urine culture for prior cystitis.    Review of Systems  Respiratory:  Negative for shortness of breath.   Cardiovascular:  Negative for chest pain.  Gastrointestinal:  Positive for abdominal pain and constipation. Negative for nausea and vomiting.        Past Medical History:  Diagnosis Date   Abdominal pain 06/23/2018   Acute midline low back pain without sciatica 09/10/2019   Adverse effect of drug 08/15/2019   Anemia    Bronchitis    2 years ago    [facility] arthritis 2019   base of left thumb    Constipation 04/07/2019   Discoloration of skin 05/17/2021   GERD (gastroesophageal reflux disease)    diet controlled , no meds   Hydrosalpinx    bilateral    Hyperlipidemia    recent dx - currently diet control - no meds   Hypertension    Hypothyroid 2003   radio active iodine - no meds   Left-sided weakness 02/12/2019   Ovarian cyst, left 02/23/2016   S/P shoulder surgery 02/28/2015   SVD (spontaneous vaginal delivery)    x 1   Urinary frequency 11/30/2018   Vitamin B 12 deficiency     Social History   Socioeconomic History   Marital status: Single    Spouse name: Not on file   Number of children: Not on file   Years of education: Not on file   Highest education level: Not on file  Occupational History   Not on file  Tobacco Use   Smoking status: Never  Smokeless tobacco: Never  Vaping Use   Vaping Use: Never used  Substance and Sexual Activity   Alcohol use: No   Drug use: No   Sexual activity: Yes    Partners: Male    Birth control/protection: Surgical    Comment: hysterectomy.  Other Topics Concern   Not on file  Social History Narrative   Single.   3 children.   Works at [employer] as a Designer, multimedia.   Enjoys relaxing, exercise, walking her dog.    Social Determinants of Health   Financial Resource Strain: Not on file  Food Insecurity: Not on file  Transportation Needs: Not on file  Physical Activity: Not on file  Stress: Not on file  Social Connections: Not on file  Intimate Partner Violence:  Not on file    Past Surgical History:  Procedure Laterality Date   CESAREAN SECTION     x 2   CHOLECYSTECTOMY  2008   DILATION AND CURETTAGE OF UTERUS     X 2   LAPAROSCOPIC BILATERAL SALPINGECTOMY Bilateral 07/29/2017   Procedure: LAPAROSCOPIC LEFT SALPINGOOPHRECTOMY: Partial right Salpingectomy;  Surgeon: Lavonia Drafts, MD;  Location: [facility];  Service: Gynecology;  Laterality: Bilateral;   SHOULDER SURGERY  2016   arthroscopic   TOTAL ABDOMINAL HYSTERECTOMY  2006   Fibroids, benign pathology; partial hysterectomy per patient    TUBAL LIGATION     WISDOM TOOTH EXTRACTION     at age 26 yr   WRIST ARTHROSCOPY Right 2007    Family History  Problem Relation Age of Onset   Hypertension Mother    Other Father        unknown medical history   Hypertension Maternal Grandmother    Heart failure Maternal Grandmother    Lung cancer Maternal Grandmother    Breast cancer Maternal Aunt    Colon cancer Neg Hx    Colon polyps Neg Hx    Rectal cancer Neg Hx    Stomach cancer Neg Hx     Allergies  Allergen Reactions   Aczone [Dapsone] Swelling   Levaquin [Levofloxacin]     Presumed cause of joint aches    Current Outpatient Medications on File Prior to Visit  Medication Sig Dispense Refill   aspirin 81 MG EC tablet Take by mouth.     atorvastatin (LIPITOR) 10 MG tablet Take 1 tablet (10 mg total) by mouth daily. for cholesterol. 90 tablet 3   azelastine (ASTELIN) 0.1 % nasal spray Place into the nose.     famotidine (PEPCID) 20 MG tablet Take 1 tablet (20 mg total) by mouth daily.     fluticasone (FLONASE) 50 MCG/ACT nasal spray Place 1 spray into both nostrils 2 (two) times daily. 16 g 2   gabapentin (NEURONTIN) 600 MG tablet Take 1,200 mg by mouth daily.     HYDROcodone-acetaminophen (NORCO/VICODIN) 5-325 MG tablet Take 1-2 tablets by mouth every 6 (six) hours as needed for moderate pain. 20 tablet 0   sertraline (ZOLOFT) 25 MG tablet Take 1 tablet  (25 mg total) by mouth daily. for anxiety and depression. 90 tablet 0   predniSONE (DELTASONE) 10 MG tablet Take by mouth.     No current facility-administered medications on file prior to visit.    BP (!) 160/88   Pulse 82   Temp 98.3 F (36.8 C) (Oral)   Ht '5\' 6"'$  (1.676 m)   Wt 174 lb (78.9 kg)   SpO2 100%   BMI 28.08 kg/m  Objective:  Physical Exam Cardiovascular:     Rate and Rhythm: Normal rate and regular rhythm.  Pulmonary:     Effort: Pulmonary effort is normal.     Breath sounds: Normal breath sounds.  Abdominal:     Palpations: Abdomen is soft.     Tenderness: There is abdominal tenderness in the right upper quadrant. There is no guarding.  Musculoskeletal:     Cervical back: Neck supple.  Skin:    General: Skin is warm and dry.          Assessment & Plan:

## 2021-07-24 NOTE — Patient Instructions (Signed)
Please notify me if you decide to start sertraline (Zoloft) for anxiety and depression.  We will be in touch once we receive your urine test results.   Avoid foods that contain nuts and seeds, popcorn, spicy/irritative foods.  It was a pleasure to see you today!

## 2021-07-26 ENCOUNTER — Ambulatory Visit (INDEPENDENT_AMBULATORY_CARE_PROVIDER_SITE_OTHER): Payer: Managed Care, Other (non HMO) | Admitting: Gastroenterology

## 2021-07-26 ENCOUNTER — Encounter: Payer: Self-pay | Admitting: Gastroenterology

## 2021-07-26 VITALS — BP 136/84 | HR 93 | Temp 98.4°F | Wt 173.5 lb

## 2021-07-26 DIAGNOSIS — G8929 Other chronic pain: Secondary | ICD-10-CM

## 2021-07-26 DIAGNOSIS — R1011 Right upper quadrant pain: Secondary | ICD-10-CM

## 2021-07-26 DIAGNOSIS — K219 Gastro-esophageal reflux disease without esophagitis: Secondary | ICD-10-CM

## 2021-07-26 LAB — URINE CULTURE
MICRO NUMBER:: 13458358
Result:: NO GROWTH
SPECIMEN QUALITY:: ADEQUATE

## 2021-07-26 MED ORDER — PANTOPRAZOLE SODIUM 40 MG PO TBEC: 40.0000 mg | DELAYED_RELEASE_TABLET | Freq: Two times a day (BID) | ORAL | 5 refills | Status: DC

## 2021-07-26 MED ORDER — NA SULFATE-K SULFATE-MG SULF 17.5-3.13-1.6 GM/177ML PO SOLN: 1.0000 | Freq: Once | ORAL | 0 refills | Status: AC

## 2021-07-26 NOTE — H&P (View-Only) (Signed)
Primary Care Physician: Pleas Koch, NP  Primary Gastroenterologist:  Dr. [name]  Chief Complaint  Patient presents with   Follow-up    HPI: Ashley [name] is a 56 y.o. female here with a history of right-sided abdominal pain.  The patient is status postcholecystectomy.  The patient was seen for the right side abdominal pain and had a CT scan that showed left-sided diverticulitis.  The patient was put on antibiotics and reports that she still has the right-sided pain but no left-sided pain.  There is no report of any fevers chills nausea vomiting but the patient states that she has been having a lot more heartburn recently.  Past Medical History:  Diagnosis Date   Abdominal pain 06/23/2018   Acute midline low back pain without sciatica 09/10/2019   Adverse effect of drug 08/15/2019   Anemia    Bronchitis    2 years ago    [facility] arthritis 2019   base of left thumb    Constipation 04/07/2019   Discoloration of skin 05/17/2021   GERD (gastroesophageal reflux disease)    diet controlled , no meds   Hydrosalpinx    bilateral    Hyperlipidemia    recent dx - currently diet control - no meds   Hypertension    Hypothyroid 2003   radio active iodine - no meds   Left-sided weakness 02/12/2019   Ovarian cyst, left 02/23/2016   S/P shoulder surgery 02/28/2015   SVD (spontaneous vaginal delivery)    x 1   Urinary frequency 11/30/2018   Vitamin B 12 deficiency     Current Outpatient Medications  Medication Sig Dispense Refill   amLODipine (NORVASC) 5 MG tablet Take 1 tablet (5 mg total) by mouth daily. for blood pressure. 90 tablet 3   aspirin 81 MG EC tablet Take by mouth.     atorvastatin (LIPITOR) 10 MG tablet Take 1 tablet (10 mg total) by mouth daily. for cholesterol. 90 tablet 3   azelastine (ASTELIN) 0.1 % nasal spray Place into the nose.     famotidine (PEPCID) 20 MG tablet Take 1 tablet (20 mg total) by mouth daily.     fluticasone (FLONASE) 50 MCG/ACT nasal  spray Place 1 spray into both nostrils 2 (two) times daily. 16 g 2   gabapentin (NEURONTIN) 600 MG tablet Take 1,200 mg by mouth daily.     HYDROcodone-acetaminophen (NORCO/VICODIN) 5-325 MG tablet Take 1-2 tablets by mouth every 6 (six) hours as needed for moderate pain. 20 tablet 0   predniSONE (DELTASONE) 10 MG tablet Take by mouth.     sertraline (ZOLOFT) 25 MG tablet Take 1 tablet (25 mg total) by mouth daily. for anxiety and depression. 90 tablet 0   No current facility-administered medications for this visit.    Allergies as of 07/26/2021 - Review Complete 07/26/2021  Allergen Reaction Noted   Aczone [dapsone] Swelling 07/24/2021   Levaquin [levofloxacin]  08/12/2019    ROS:  General: Negative for anorexia, weight loss, fever, chills, fatigue, weakness. ENT: Negative for hoarseness, difficulty swallowing , nasal congestion. CV: Negative for chest pain, angina, palpitations, dyspnea on exertion, peripheral edema.  Respiratory: Negative for dyspnea at rest, dyspnea on exertion, cough, sputum, wheezing.  GI: See history of present illness. GU:  Negative for dysuria, hematuria, urinary incontinence, urinary frequency, nocturnal urination.  Endo: Negative for unusual weight change.    Physical Examination:   BP 136/84   Pulse 93   Temp 98.4 F (36.9 C) (  Oral)   Wt 173 lb 8 oz (78.7 kg)   BMI 28.00 kg/m   General: Well-nourished, well-developed in no acute distress.  Eyes: No icterus. Conjunctivae pink. Lungs: Clear to auscultation bilaterally. Non-labored. Heart: Regular rate and rhythm, no murmurs rubs or gallops.  Abdomen: Bowel sounds are normal, positive tenderness in the right upper quadrant, nondistended, no hepatosplenomegaly or masses, no abdominal bruits or hernia , no rebound or guarding.   Extremities: No lower extremity edema. No clubbing or deformities. Neuro: Alert and oriented x 3.  Grossly intact. Skin: Warm and dry, no jaundice.   Psych: Alert and  cooperative, normal mood and affect.  Labs:    Imaging Studies: CT Renal Stone Study  Result Date: 06/29/2021 CLINICAL DATA:  Provided history: Flank pain, kidney stone suspected (pregnant) Technologist notes state patient reports right-sided back/flank and pelvic pain. On antibiotic for urinary tract infection. Low-grade fevers EXAM: CT ABDOMEN AND PELVIS WITHOUT CONTRAST TECHNIQUE: Multidetector CT imaging of the abdomen and pelvis was performed following the standard protocol without IV contrast. RADIATION DOSE REDUCTION: This exam was performed according to the departmental dose-optimization program which includes automated exposure control, adjustment of the mA and/or kV according to patient size and/or use of iterative reconstruction technique. COMPARISON:  Contrast enhanced CT 03/29/2021 FINDINGS: Lower chest: No basilar airspace disease or pleural effusion. Hepatobiliary: No focal liver abnormality is seen. Status post cholecystectomy. No biliary dilatation. Pancreas: No ductal dilatation or inflammation. Spleen: Normal in size without focal abnormality. Adrenals/Urinary Tract: No adrenal nodule. No hydronephrosis or renal calculi. No perinephric edema. No evidence of focal renal abnormality on this unenhanced exam. No perirenal collection. Urinary bladder is partially distended and unremarkable. No bladder wall thickening or perivesicular fat stranding. Stomach/Bowel: The stomach is nondistended. There is no small bowel obstruction or inflammation. Normal terminal ileum. The appendix is air-filled, prominent caliber of but no wall thickening or periappendiceal fat stranding. No appendicolith. No appendicitis. Small volume of colonic stool. Diverticulosis from the splenic flexure distally. There may be faint Peri diverticular inflammation about the proximal sigmoid in the central/right pelvis, series 5, image 40. Vascular/Lymphatic: Mild aorto bi-iliac atherosclerosis. No aneurysm. No portal venous  gas. No bulky abdominopelvic adenopathy. Reproductive: Status post hysterectomy. No adnexal masses. Other: Small fat containing umbilical hernia. No free air or ascites. Musculoskeletal: There are no acute or suspicious osseous abnormalities. Stable bone island in the right proximal femur. No abdominal wall soft tissue abnormalities. IMPRESSION: 1. No renal stones, renal inflammation or obstructive uropathy. 2. Possible faint pericolonic inflammation about the proximal sigmoid colon, suggesting mild acute diverticulitis. No perforation or abscess. Aortic Atherosclerosis (ICD10-I70.0). Electronically Signed   By: Keith Rake M.D.   On: 06/29/2021 15:13    Assessment and Plan:   Ashley [name] is a 56 y.o. y/o female who comes in today with abdominal pain that is reproducible by deep palpation of the right upper quadrant without any tenderness on the left.  The patient also has had worsening GERD despite being on pantoprazole.  The patient will be set up for a colonoscopy due to her recurrent diverticulitis and she will also be set up for an upper endoscopy due to her reflux symptoms.  The patient will double up on her Protonix for now.  The patient has been explained the plan agrees with it.     [name], MD. Marval Regal    Note: This dictation was prepared with Dragon dictation along with smaller phrase technology. Any transcriptional errors that result from  this process are unintentional.

## 2021-07-26 NOTE — Progress Notes (Signed)
Primary Care Physician: Pleas Koch, NP  Primary Gastroenterologist:  Dr. [name]  Chief Complaint  Patient presents with   Follow-up    HPI: Ashley [name] is a 56 y.o. female here with a history of right-sided abdominal pain.  The patient is status postcholecystectomy.  The patient was seen for the right side abdominal pain and had a CT scan that showed left-sided diverticulitis.  The patient was put on antibiotics and reports that she still has the right-sided pain but no left-sided pain.  There is no report of any fevers chills nausea vomiting but the patient states that she has been having a lot more heartburn recently.  Past Medical History:  Diagnosis Date   Abdominal pain 06/23/2018   Acute midline low back pain without sciatica 09/10/2019   Adverse effect of drug 08/15/2019   Anemia    Bronchitis    2 years ago    [facility] arthritis 2019   base of left thumb    Constipation 04/07/2019   Discoloration of skin 05/17/2021   GERD (gastroesophageal reflux disease)    diet controlled , no meds   Hydrosalpinx    bilateral    Hyperlipidemia    recent dx - currently diet control - no meds   Hypertension    Hypothyroid 2003   radio active iodine - no meds   Left-sided weakness 02/12/2019   Ovarian cyst, left 02/23/2016   S/P shoulder surgery 02/28/2015   SVD (spontaneous vaginal delivery)    x 1   Urinary frequency 11/30/2018   Vitamin B 12 deficiency     Current Outpatient Medications  Medication Sig Dispense Refill   amLODipine (NORVASC) 5 MG tablet Take 1 tablet (5 mg total) by mouth daily. for blood pressure. 90 tablet 3   aspirin 81 MG EC tablet Take by mouth.     atorvastatin (LIPITOR) 10 MG tablet Take 1 tablet (10 mg total) by mouth daily. for cholesterol. 90 tablet 3   azelastine (ASTELIN) 0.1 % nasal spray Place into the nose.     famotidine (PEPCID) 20 MG tablet Take 1 tablet (20 mg total) by mouth daily.     fluticasone (FLONASE) 50 MCG/ACT nasal  spray Place 1 spray into both nostrils 2 (two) times daily. 16 g 2   gabapentin (NEURONTIN) 600 MG tablet Take 1,200 mg by mouth daily.     HYDROcodone-acetaminophen (NORCO/VICODIN) 5-325 MG tablet Take 1-2 tablets by mouth every 6 (six) hours as needed for moderate pain. 20 tablet 0   predniSONE (DELTASONE) 10 MG tablet Take by mouth.     sertraline (ZOLOFT) 25 MG tablet Take 1 tablet (25 mg total) by mouth daily. for anxiety and depression. 90 tablet 0   No current facility-administered medications for this visit.    Allergies as of 07/26/2021 - Review Complete 07/26/2021  Allergen Reaction Noted   Aczone [dapsone] Swelling 07/24/2021   Levaquin [levofloxacin]  08/12/2019    ROS:  General: Negative for anorexia, weight loss, fever, chills, fatigue, weakness. ENT: Negative for hoarseness, difficulty swallowing , nasal congestion. CV: Negative for chest pain, angina, palpitations, dyspnea on exertion, peripheral edema.  Respiratory: Negative for dyspnea at rest, dyspnea on exertion, cough, sputum, wheezing.  GI: See history of present illness. GU:  Negative for dysuria, hematuria, urinary incontinence, urinary frequency, nocturnal urination.  Endo: Negative for unusual weight change.    Physical Examination:   BP 136/84   Pulse 93   Temp 98.4 F (36.9 C) (  Oral)   Wt 173 lb 8 oz (78.7 kg)   BMI 28.00 kg/m   General: Well-nourished, well-developed in no acute distress.  Eyes: No icterus. Conjunctivae pink. Lungs: Clear to auscultation bilaterally. Non-labored. Heart: Regular rate and rhythm, no murmurs rubs or gallops.  Abdomen: Bowel sounds are normal, positive tenderness in the right upper quadrant, nondistended, no hepatosplenomegaly or masses, no abdominal bruits or hernia , no rebound or guarding.   Extremities: No lower extremity edema. No clubbing or deformities. Neuro: Alert and oriented x 3.  Grossly intact. Skin: Warm and dry, no jaundice.   Psych: Alert and  cooperative, normal mood and affect.  Labs:    Imaging Studies: CT Renal Stone Study  Result Date: 06/29/2021 CLINICAL DATA:  Provided history: Flank pain, kidney stone suspected (pregnant) Technologist notes state patient reports right-sided back/flank and pelvic pain. On antibiotic for urinary tract infection. Low-grade fevers EXAM: CT ABDOMEN AND PELVIS WITHOUT CONTRAST TECHNIQUE: Multidetector CT imaging of the abdomen and pelvis was performed following the standard protocol without IV contrast. RADIATION DOSE REDUCTION: This exam was performed according to the departmental dose-optimization program which includes automated exposure control, adjustment of the mA and/or kV according to patient size and/or use of iterative reconstruction technique. COMPARISON:  Contrast enhanced CT 03/29/2021 FINDINGS: Lower chest: No basilar airspace disease or pleural effusion. Hepatobiliary: No focal liver abnormality is seen. Status post cholecystectomy. No biliary dilatation. Pancreas: No ductal dilatation or inflammation. Spleen: Normal in size without focal abnormality. Adrenals/Urinary Tract: No adrenal nodule. No hydronephrosis or renal calculi. No perinephric edema. No evidence of focal renal abnormality on this unenhanced exam. No perirenal collection. Urinary bladder is partially distended and unremarkable. No bladder wall thickening or perivesicular fat stranding. Stomach/Bowel: The stomach is nondistended. There is no small bowel obstruction or inflammation. Normal terminal ileum. The appendix is air-filled, prominent caliber of but no wall thickening or periappendiceal fat stranding. No appendicolith. No appendicitis. Small volume of colonic stool. Diverticulosis from the splenic flexure distally. There may be faint Peri diverticular inflammation about the proximal sigmoid in the central/right pelvis, series 5, image 40. Vascular/Lymphatic: Mild aorto bi-iliac atherosclerosis. No aneurysm. No portal venous  gas. No bulky abdominopelvic adenopathy. Reproductive: Status post hysterectomy. No adnexal masses. Other: Small fat containing umbilical hernia. No free air or ascites. Musculoskeletal: There are no acute or suspicious osseous abnormalities. Stable bone island in the right proximal femur. No abdominal wall soft tissue abnormalities. IMPRESSION: 1. No renal stones, renal inflammation or obstructive uropathy. 2. Possible faint pericolonic inflammation about the proximal sigmoid colon, suggesting mild acute diverticulitis. No perforation or abscess. Aortic Atherosclerosis (ICD10-I70.0). Electronically Signed   By: Keith Rake M.D.   On: 06/29/2021 15:13    Assessment and Plan:   Starlet Gallentine is a 56 y.o. y/o female who comes in today with abdominal pain that is reproducible by deep palpation of the right upper quadrant without any tenderness on the left.  The patient also has had worsening GERD despite being on pantoprazole.  The patient will be set up for a colonoscopy due to her recurrent diverticulitis and she will also be set up for an upper endoscopy due to her reflux symptoms.  The patient will double up on her Protonix for now.  The patient has been explained the plan agrees with it.     [name], MD. Marval Regal    Note: This dictation was prepared with Dragon dictation along with smaller phrase technology. Any transcriptional errors that result from  this process are unintentional.

## 2021-08-06 ENCOUNTER — Ambulatory Visit: Payer: Managed Care, Other (non HMO) | Admitting: Anesthesiology

## 2021-08-06 ENCOUNTER — Other Ambulatory Visit: Payer: Self-pay

## 2021-08-06 ENCOUNTER — Ambulatory Visit
Admission: RE | Admit: 2021-08-06 | Discharge: 2021-08-06 | Disposition: A | Discharge status: Home / Self Care | Payer: Managed Care, Other (non HMO) | Attending: Gastroenterology | Admitting: Gastroenterology

## 2021-08-06 ENCOUNTER — Encounter: Payer: Self-pay | Admitting: Gastroenterology

## 2021-08-06 ENCOUNTER — Encounter
Admission: RE | Disposition: A | Discharge status: Home / Self Care | Payer: Self-pay | Source: Home / Self Care | Attending: Gastroenterology

## 2021-08-06 DIAGNOSIS — K640 First degree hemorrhoids: Secondary | ICD-10-CM | POA: Diagnosis not present

## 2021-08-06 DIAGNOSIS — K2950 Unspecified chronic gastritis without bleeding: Secondary | ICD-10-CM | POA: Diagnosis not present

## 2021-08-06 DIAGNOSIS — D125 Benign neoplasm of sigmoid colon: Secondary | ICD-10-CM | POA: Insufficient documentation

## 2021-08-06 DIAGNOSIS — Z79899 Other long term (current) drug therapy: Secondary | ICD-10-CM | POA: Diagnosis not present

## 2021-08-06 DIAGNOSIS — Z09 Encounter for follow-up examination after completed treatment for conditions other than malignant neoplasm: Secondary | ICD-10-CM | POA: Insufficient documentation

## 2021-08-06 DIAGNOSIS — K219 Gastro-esophageal reflux disease without esophagitis: Secondary | ICD-10-CM

## 2021-08-06 DIAGNOSIS — E785 Hyperlipidemia, unspecified: Secondary | ICD-10-CM | POA: Diagnosis not present

## 2021-08-06 DIAGNOSIS — E039 Hypothyroidism, unspecified: Secondary | ICD-10-CM | POA: Insufficient documentation

## 2021-08-06 DIAGNOSIS — K5732 Diverticulitis of large intestine without perforation or abscess without bleeding: Secondary | ICD-10-CM

## 2021-08-06 DIAGNOSIS — I10 Essential (primary) hypertension: Secondary | ICD-10-CM | POA: Diagnosis not present

## 2021-08-06 DIAGNOSIS — K635 Polyp of colon: Secondary | ICD-10-CM | POA: Diagnosis not present

## 2021-08-06 DIAGNOSIS — K319 Disease of stomach and duodenum, unspecified: Secondary | ICD-10-CM | POA: Insufficient documentation

## 2021-08-06 HISTORY — PX: POLYPECTOMY: SHX5525

## 2021-08-06 HISTORY — PX: ESOPHAGOGASTRODUODENOSCOPY (EGD) WITH PROPOFOL: SHX5813

## 2021-08-06 HISTORY — PX: COLONOSCOPY WITH PROPOFOL: SHX5780

## 2021-08-06 SURGERY — COLONOSCOPY WITH PROPOFOL
Anesthesia: General | Site: Throat

## 2021-08-06 MED ORDER — ACETAMINOPHEN 325 MG PO TABS: 325.0000 mg | ORAL_TABLET | ORAL | Status: DC | PRN

## 2021-08-06 MED ORDER — STERILE WATER FOR IRRIGATION IR SOLN
Status: DC | PRN
  Administered 2021-08-06: 500 mL

## 2021-08-06 MED ORDER — GLYCOPYRROLATE 0.2 MG/ML IJ SOLN
INTRAMUSCULAR | Status: DC | PRN
  Administered 2021-08-06: .1 mg via INTRAVENOUS

## 2021-08-06 MED ORDER — LACTATED RINGERS IV SOLN: INTRAVENOUS | Status: DC

## 2021-08-06 MED ORDER — ACETAMINOPHEN 160 MG/5ML PO SOLN: 325.0000 mg | ORAL | Status: DC | PRN

## 2021-08-06 MED ORDER — ONDANSETRON HCL 4 MG/2ML IJ SOLN: 4.0000 mg | Freq: Once | INTRAMUSCULAR | Status: DC | PRN

## 2021-08-06 MED ORDER — STERILE WATER FOR IRRIGATION IR SOLN
Status: DC | PRN
  Administered 2021-08-06: .05 mL

## 2021-08-06 MED ORDER — SODIUM CHLORIDE 0.9 % IV SOLN: INTRAVENOUS | Status: DC

## 2021-08-06 MED ORDER — PROPOFOL 10 MG/ML IV BOLUS
INTRAVENOUS | Status: DC | PRN
  Administered 2021-08-06: 20 mg via INTRAVENOUS
  Administered 2021-08-06 (×2): 30 mg via INTRAVENOUS
  Administered 2021-08-06: 70 mg via INTRAVENOUS
  Administered 2021-08-06: 30 mg via INTRAVENOUS
  Administered 2021-08-06: 50 mg via INTRAVENOUS
  Administered 2021-08-06: 30 mg via INTRAVENOUS

## 2021-08-06 SURGICAL SUPPLY — 10 items
BLOCK BITE 60FR ADLT L/F GRN (MISCELLANEOUS) ×5 IMPLANT
FORCEPS BIOP RAD 4 LRG CAP 4 (CUTTING FORCEPS) ×1 IMPLANT
GOWN CVR UNV OPN BCK APRN NK (MISCELLANEOUS) ×8 IMPLANT
GOWN ISOL THUMB LOOP REG UNIV (MISCELLANEOUS) ×8
KIT PRC NS LF DISP ENDO (KITS) ×8 IMPLANT
KIT PROCEDURE OLYMPUS (KITS) ×12
MANIFOLD NEPTUNE II (INSTRUMENTS) ×10 IMPLANT
SNARE COLD EXACTO (MISCELLANEOUS) ×1 IMPLANT
TRAP ETRAP POLY (MISCELLANEOUS) ×1 IMPLANT
WATER STERILE IRR 250ML POUR (IV SOLUTION) ×6 IMPLANT

## 2021-08-06 NOTE — Anesthesia Preprocedure Evaluation (Signed)
Anesthesia Evaluation  Patient identified by MRN, date of birth, ID band Patient awake    Reviewed: Allergy & Precautions, NPO status   Airway Mallampati: II  TM Distance: >3 FB     Dental   Pulmonary    Pulmonary exam normal        Cardiovascular hypertension,  Rhythm:Regular Rate:Normal     Neuro/Psych    GI/Hepatic GERD  ,  Endo/Other  Hypothyroidism   Renal/GU      Musculoskeletal  (+) Arthritis ,   Abdominal   Peds  Hematology  (+) Blood dyscrasia, anemia ,   Anesthesia Other Findings   Reproductive/Obstetrics                             Anesthesia Physical Anesthesia Plan  ASA: 2  Anesthesia Plan: General   Post-op Pain Management:    Induction: Intravenous  PONV Risk Score and Plan: Propofol infusion, TIVA and Treatment may vary due to age or medical condition  Airway Management Planned: Natural Airway and Nasal Cannula  Additional Equipment:   Intra-op Plan:   Post-operative Plan:   Informed Consent: I have reviewed the patients History and Physical, chart, labs and discussed the procedure including the risks, benefits and alternatives for the proposed anesthesia with the patient or authorized representative who has indicated his/her understanding and acceptance.       Plan Discussed with: CRNA  Anesthesia Plan Comments:         Anesthesia Quick Evaluation

## 2021-08-06 NOTE — Interval H&P Note (Signed)
Ashley Lame, MD [facility] [address], [place], [place] [address] [phone] Fax : [phone]  Primary Care Physician:  Ashley Koch, NP Primary Gastroenterologist:  Dr. Allen Norris  Pre-Procedure History & Physical: HPI:  Ashley [name] is a 56 y.o. female is here for an endoscopy and colonoscopy.   Past Medical History:  Diagnosis Date   Abdominal pain 06/23/2018   Acute midline low back pain without sciatica 09/10/2019   Adverse effect of drug 08/15/2019   Anemia    Bronchitis    2 years ago    [facility] arthritis 2019   base of left thumb    Constipation 04/07/2019   Discoloration of skin 05/17/2021   GERD (gastroesophageal reflux disease)    diet controlled , no meds   Hydrosalpinx    bilateral    Hyperlipidemia    recent dx - currently diet control - no meds   Hypertension    Hypothyroid 2003   radio active iodine - no meds   Left-sided weakness 02/12/2019   Ovarian cyst, left 02/23/2016   S/P shoulder surgery 02/28/2015   SVD (spontaneous vaginal delivery)    x 1   Urinary frequency 11/30/2018   Vitamin B 12 deficiency     Past Surgical History:  Procedure Laterality Date   CESAREAN SECTION     x 2   CHOLECYSTECTOMY  2008   DILATION AND CURETTAGE OF UTERUS     X 2   LAPAROSCOPIC BILATERAL SALPINGECTOMY Bilateral 07/29/2017   Procedure: LAPAROSCOPIC LEFT SALPINGOOPHRECTOMY: Partial right Salpingectomy;  Surgeon: Lavonia Drafts, MD;  Location: [place];  Service: Gynecology;  Laterality: Bilateral;   SHOULDER SURGERY  2016   arthroscopic   TOTAL ABDOMINAL HYSTERECTOMY  2006   Fibroids, benign pathology; partial hysterectomy per patient    TUBAL LIGATION     WISDOM TOOTH EXTRACTION     at age 53 yr   WRIST ARTHROSCOPY Right 2007    Prior to Admission medications   Medication Sig Start Date End Date Taking? Authorizing Provider  amLODipine (NORVASC) 5 MG tablet Take 1 tablet (5 mg total) by mouth daily. for  blood pressure. 07/24/21  Yes Ashley Koch, NP  aspirin 81 MG EC tablet Take by mouth. 09/16/19  Yes [provider]  atorvastatin (LIPITOR) 10 MG tablet Take 1 tablet (10 mg total) by mouth daily. for cholesterol. 06/26/21  Yes Ashley Koch, NP  azelastine (ASTELIN) 0.1 % nasal spray Place into the nose. 03/11/19  Yes [provider]  famotidine (PEPCID) 20 MG tablet Take 1 tablet (20 mg total) by mouth daily. 10/03/20  Yes Tonia Ghent, MD  fluticasone [facility]) 50 MCG/ACT nasal spray Place 1 spray into both nostrils 2 (two) times daily. 11/19/19  Yes Tower, Wynelle Fanny, MD  gabapentin (NEURONTIN) 600 MG tablet Take 1,200 mg by mouth daily. 01/25/20  Yes [provider]  pantoprazole (PROTONIX) 40 MG tablet Take 1 tablet (40 mg total) by mouth 2 (two) times daily before a meal. 07/26/21  Yes Ashley Lame, MD  HYDROcodone-acetaminophen (NORCO/VICODIN) 5-325 MG tablet Take 1-2 tablets by mouth every 6 (six) hours as needed for moderate pain. Patient not taking: Reported on 08/06/2021 06/29/21 06/29/22  Johnn Hai, PA-C  predniSONE (DELTASONE) 10 MG tablet Take by mouth. Patient not taking: Reported on 08/06/2021 07/20/21   [provider]  sertraline (ZOLOFT) 25 MG tablet Take 1 tablet (25 mg total) by mouth daily. for anxiety and depression. Patient not  taking: Reported on 08/06/2021 06/26/21   Ashley Koch, NP    Allergies as of 08/06/2021 - Review Complete 08/06/2021  Allergen Reaction Noted   Aczone [dapsone] Swelling 07/24/2021   Levaquin [levofloxacin]  08/12/2019    Family History  Problem Relation Age of Onset   Hypertension Mother    Other Father        unknown medical history   Hypertension Maternal Grandmother    Heart failure Maternal Grandmother    Lung cancer Maternal Grandmother    Breast cancer Maternal Aunt    Colon cancer Neg Hx    Colon polyps Neg Hx    Rectal cancer Neg Hx    Stomach cancer Neg Hx     Social History    Socioeconomic History   Marital status: Single    Spouse name: Not on file   Number of children: Not on file   Years of education: Not on file   Highest education level: Not on file  Occupational History   Not on file  Tobacco Use   Smoking status: Never   Smokeless tobacco: Never  Vaping Use   Vaping Use: Never used  Substance and Sexual Activity   Alcohol use: No   Drug use: No   Sexual activity: Yes    Partners: Male    Birth control/protection: Surgical    Comment: hysterectomy.  Other Topics Concern   Not on file  Social History Narrative   Single.   3 children.   Works at [employer] as a Designer, multimedia.   Enjoys relaxing, exercise, walking her dog.    Social Determinants of Health   Financial Resource Strain: Not on file  Food Insecurity: Not on file  Transportation Needs: Not on file  Physical Activity: Not on file  Stress: Not on file  Social Connections: Not on file  Intimate Partner Violence: Not on file    Review of Systems: See HPI, otherwise negative ROS  Physical Exam: BP 139/80   Pulse 82   Temp 97.9 F (36.6 C) (Temporal)   Resp 16   Ht '5\' 6"'$  (1.676 m)   Wt 77.9 kg   SpO2 99%   BMI 27.73 kg/m  General:   Alert,  pleasant and cooperative in NAD Head:  Normocephalic and atraumatic. Neck:  Supple; no masses or thyromegaly. Lungs:  Clear throughout to auscultation.    Heart:  Regular rate and rhythm. Abdomen:  Soft, nontender and nondistended. Normal bowel sounds, without guarding, and without rebound.   Neurologic:  Alert and  oriented x4;  grossly normal neurologically.  Impression/Plan: Ashley [name] is here for an endoscopy and colonoscopy to be performed for GERD and diverticulitis  Risks, benefits, limitations, and alternatives regarding  endoscopy and colonoscopy have been reviewed with the patient.  Questions have been answered.  All parties agreeable.   Ashley Lame, MD  08/06/2021, 10:25 AM

## 2021-08-06 NOTE — Anesthesia Procedure Notes (Signed)
Date/Time: 08/06/2021 11:02 AM  Performed by: Dionne Bucy, CRNAPre-anesthesia Checklist: Patient identified, Emergency Drugs available, Suction available, Patient being monitored and Timeout performed Patient Re-evaluated:Patient Re-evaluated prior to induction Oxygen Delivery Method: Nasal cannula Induction Type: IV induction Placement Confirmation: positive ETCO2

## 2021-08-06 NOTE — Op Note (Signed)
[facility] Patient Name: Ashley [name] Procedure Date: 08/06/2021 11:00 AM MRN: [id number] Account #: [id number] Date of Birth: [birth year] Admit Type: Outpatient Age: 56 Room: [facility] OR ROOM 01 Gender: Female Note Status: Finalized Instrument Name: [id number] Procedure:             Colonoscopy Indications:           Follow-up of diverticulitis Providers:             [name] MD, MD Referring MD:          Pleas Koch (Referring MD) Medicines:             Propofol per Anesthesia Complications:         No immediate complications. Procedure:             Pre-Anesthesia Assessment:                        - Prior to the procedure, a History and Physical was                         performed, and patient medications and allergies were                         reviewed. The patient's tolerance of previous                         anesthesia was also reviewed. The risks and benefits                         of the procedure and the sedation options and risks                         were discussed with the patient. All questions were                         answered, and informed consent was obtained. Prior                         Anticoagulants: The patient has taken no previous                         anticoagulant or antiplatelet agents. ASA Grade                         Assessment: II - A patient with mild systemic disease.                         After reviewing the risks and benefits, the patient                         was deemed in satisfactory condition to undergo the                         procedure.                        After obtaining informed consent, the colonoscope was  passed under direct vision. Throughout the procedure,                         the patient's blood pressure, pulse, and oxygen                         saturations were monitored continuously. The was                         introduced  through the anus and advanced to the the                         cecum, identified by appendiceal orifice and ileocecal                         valve. The colonoscopy was performed without                         difficulty. The patient tolerated the procedure well.                         The quality of the bowel preparation was excellent. Findings:      The perianal and digital rectal examinations were normal.      Multiple small-mouthed diverticula were found in the entire colon.      A 8 mm polyp was found in the sigmoid colon. The polyp was sessile. The       polyp was removed with a cold snare. Resection and retrieval were       complete.      Non-bleeding internal hemorrhoids were found during retroflexion. The       hemorrhoids were Grade I (internal hemorrhoids that do not prolapse). Impression:            - Diverticulosis in the entire examined colon.                        - One 8 mm polyp in the sigmoid colon, removed with a                         cold snare. Resected and retrieved.                        - Non-bleeding internal hemorrhoids. Recommendation:        - Discharge patient to home.                        - Resume previous diet.                        - Continue present medications.                        - Await pathology results.                        - If the pathology report reveals adenomatous tissue,                         then repeat the colonoscopy for surveillance in 7  years. Procedure Code(s):     --- Professional ---                        [phone], Colonoscopy, flexible; with removal of                         tumor(s), polyp(s), or other lesion(s) by snare                         technique Diagnosis Code(s):     --- Professional ---                        U20.25, Diverticulitis of large intestine without                         perforation or abscess without bleeding                        K63.5, Polyp of colon CPT copyright  2019 American Medical Association. All rights reserved. The codes documented in this report are preliminary and upon coder review may  be revised to meet current compliance requirements. [name] MD, MD 08/06/2021 11:26:46 AM This report has been signed electronically. Number of Addenda: 0 Note Initiated On: 08/06/2021 11:00 AM Scope Withdrawal Time: 0 hours 9 minutes 6 seconds  Total Procedure Duration: 0 hours 12 minutes 19 seconds  Estimated Blood Loss:  Estimated blood loss: none.      [facility]

## 2021-08-06 NOTE — Op Note (Signed)
[facility] Gastroenterology Patient Name: Ashley [name] Procedure Date: 08/06/2021 11:01 AM MRN: [id number] Account #: [id number] Date of Birth: Dec 20, 1965 Admit Type: Outpatient Age: 56 Room: [facility] OR ROOM 1 Gender: Female Note Status: Finalized Instrument Name: [id number] Procedure:             Upper GI endoscopy Indications:           Heartburn Providers:             [name] MD, MD Referring MD:          Pleas Koch (Referring MD) Medicines:             Propofol per Anesthesia Complications:         No immediate complications. Procedure:             Pre-Anesthesia Assessment:                        - Prior to the procedure, a History and Physical was                         performed, and patient medications and allergies were                         reviewed. The patient's tolerance of previous                         anesthesia was also reviewed. The risks and benefits                         of the procedure and the sedation options and risks                         were discussed with the patient. All questions were                         answered, and informed consent was obtained. Prior                         Anticoagulants: The patient has taken no previous                         anticoagulant or antiplatelet agents. ASA Grade                         Assessment: II - A patient with mild systemic disease.                         After reviewing the risks and benefits, the patient                         was deemed in satisfactory condition to undergo the                         procedure.                        After obtaining informed consent, the endoscope was  passed under direct vision. Throughout the procedure,                         the patient's blood pressure, pulse, and oxygen                         saturations were monitored continuously. The Endoscope                         was introduced through the  mouth, and advanced to the                         second part of duodenum. The upper GI endoscopy was                         accomplished without difficulty. The patient tolerated                         the procedure well. Findings:      The examined esophagus was normal.      A single umbilicated lesion was found in the gastric antrum. Biopsies       were taken with a cold forceps for histology.      Localized minimal inflammation characterized by erythema was found in       the gastric antrum. Biopsies were taken with a cold forceps for       histology.      The examined duodenum was normal. Impression:            - Normal esophagus.                        - A single lesion suspicious for aberrant pancreas was                         found in the stomach. Biopsied.                        - Gastritis. Biopsied.                        - Normal examined duodenum. Recommendation:        - Discharge patient to home.                        - Resume previous diet.                        - Continue present medications.                        - Await pathology results.                        - Perform a colonoscopy today. Procedure Code(s):     --- Professional ---                        [phone], Esophagogastroduodenoscopy, flexible,                         transoral; with biopsy, single or multiple Diagnosis Code(s):     ---  Professional ---                        R12, Heartburn                        K29.70, Gastritis, unspecified, without bleeding CPT copyright 2019 American Medical Association. All rights reserved. The codes documented in this report are preliminary and upon coder review may  be revised to meet current compliance requirements. [name] MD, MD 08/06/2021 11:10:49 AM This report has been signed electronically. Number of Addenda: 0 Note Initiated On: 08/06/2021 11:01 AM Total Procedure Duration: 0 hours 3 minutes 14 seconds  Estimated Blood Loss:  Estimated blood  loss: none.      [facility]

## 2021-08-06 NOTE — Transfer of Care (Signed)
Immediate Anesthesia Transfer of Care Note  Patient: Ashley [name]  Procedure(s) Performed: COLONOSCOPY WITH PROPOFOL (Rectum) POLYPECTOMY (Rectum) ESOPHAGOGASTRODUODENOSCOPY (EGD) WITH PROPOFOL (Throat)  Patient Location: PACU  Anesthesia Type: General  Level of Consciousness: awake, alert  and patient cooperative  Airway and Oxygen Therapy: Patient Spontanous Breathing and Patient connected to supplemental oxygen  Post-op Assessment: Post-op Vital signs reviewed, Patient's Cardiovascular Status Stable, Respiratory Function Stable, Patent Airway and No signs of Nausea or vomiting  Post-op Vital Signs: Reviewed and stable  Complications: No notable events documented.

## 2021-08-06 NOTE — Anesthesia Postprocedure Evaluation (Signed)
Anesthesia Post Note  Patient: Ashley [name]  Procedure(s) Performed: COLONOSCOPY WITH PROPOFOL (Rectum) POLYPECTOMY (Rectum) ESOPHAGOGASTRODUODENOSCOPY (EGD) WITH PROPOFOL (Throat)     Patient location during evaluation: PACU Anesthesia Type: General Level of consciousness: awake Pain management: pain level controlled Vital Signs Assessment: post-procedure vital signs reviewed and stable Respiratory status: respiratory function stable Cardiovascular status: stable Postop Assessment: no signs of nausea or vomiting Anesthetic complications: no   No notable events documented.  Veda Canning

## 2021-08-07 ENCOUNTER — Encounter: Payer: Self-pay | Admitting: Gastroenterology

## 2021-08-08 DIAGNOSIS — A609 Anogenital herpesviral infection, unspecified: Secondary | ICD-10-CM

## 2021-08-08 LAB — SURGICAL PATHOLOGY

## 2021-08-08 NOTE — Telephone Encounter (Signed)
I was trying to see what outbreak she was speaking of. I do not see anything unless it is diverticulitis? I see that she has internal hemorrhoids on her colonoscopy. I need some clarification as to what is going on please

## 2021-08-08 NOTE — Telephone Encounter (Signed)
Can we send in refill or does she need to be seen?

## 2021-08-08 NOTE — Telephone Encounter (Signed)
HSV out break started yesterday. Denies any new exposure to andy STI. No vaginal discharge or odor. All symptoms same as past outbreak

## 2021-08-09 MED ORDER — VALACYCLOVIR HCL 1 G PO TABS: 1000.0000 mg | ORAL_TABLET | Freq: Every day | ORAL | 0 refills | Status: DC

## 2021-08-09 NOTE — Addendum Note (Signed)
Addended by: Michela Pitcher on: 08/09/2021 07:35 AM   Modules accepted: Orders

## 2021-08-09 NOTE — Telephone Encounter (Signed)
Medication sent to pharmacy and sent mychart message to patient

## 2021-08-13 ENCOUNTER — Telehealth: Payer: Self-pay | Admitting: Gastroenterology

## 2021-08-13 ENCOUNTER — Encounter: Payer: Self-pay | Admitting: Gastroenterology

## 2021-08-13 NOTE — Telephone Encounter (Signed)
PA has been submitted via [email or web address], waiting on response

## 2021-08-13 NOTE — Telephone Encounter (Signed)
Patient states that she called her pharmacy in reference to her prescription of pantoprazole and the pharmacy stated that there was an issue on our side. Patient is requesting we call the pharmacy to resolve the issue and she is also requesting a call back.

## 2021-08-14 NOTE — Telephone Encounter (Signed)
OECXFQ:[id number];[id number];Review Type:Qty; Coverage Start Date:08/13/2021;Coverage End Date:08/13/2022  Pt is aware of colonoscopy and EGD pathology results

## 2021-08-17 ENCOUNTER — Telehealth: Payer: Self-pay

## 2021-08-29 ENCOUNTER — Ambulatory Visit: Payer: Managed Care, Other (non HMO) | Admitting: Gastroenterology

## 2021-08-30 NOTE — Telephone Encounter (Signed)
[employer] is calling in stating they need a modifications letter that stated all modifications ended on 07/31/21.

## 2021-08-31 NOTE — Telephone Encounter (Signed)
Please contact patient for clarification. Share the message from [place].  All they need is for me to say "all prior modifications ended on 07/31/2021"?

## 2021-08-31 NOTE — Telephone Encounter (Signed)
Called patient she states that all modifications should have ended 6/21. From 6/7 to 6/20 she was modified for 4 hours a day.

## 2021-08-31 NOTE — Telephone Encounter (Signed)
Noted. Letter printed and placed in Ashley [name]'s inbox. Not sure if this needs faxing.

## 2021-08-31 NOTE — Telephone Encounter (Signed)
Called verified fax number with insurance and faxed to number provided.   [phone]

## 2021-09-06 ENCOUNTER — Telehealth: Payer: Self-pay | Admitting: Primary Care

## 2021-09-06 NOTE — Telephone Encounter (Signed)
Pt notified as instructed and pt voiced understanding. Pt said has not had H/A or dizziness since 09/05/21 and pt appreciative for appt with Gentry Fitz NP on 09/07/21 at 3:20. UC & ED precautions given and pt voiced understanding. Sending to Gentry Fitz NP and [facility] CMA.

## 2021-09-06 NOTE — Telephone Encounter (Signed)
I spoke with pt; pt does not have H/A or dizziness now and pt will go when gets off work to [employer]. UC & ED precautions given and pt voiced understanding. Sending note to Gentry Fitz NP and [facility] CMA.

## 2021-09-06 NOTE — Telephone Encounter (Signed)
Noted.  If she's stable and not experiencing symptoms then we can add her on for 09/07/2021 at 320.

## 2021-09-06 NOTE — Telephone Encounter (Signed)
Patient scheduled an appointment through [facility] for light headedness, and constant headaches. Called patient and sent patient to acces nurse.

## 2021-09-06 NOTE — Telephone Encounter (Signed)
[facility] - Client TELEPHONE ADVICE RECORD [facility] Patient Name: Ashley [name] [facility] Gender: Female DOB: [birth year] Age: 56 Y 81 M 15 D Return Phone Number: [phone] (Primary) Address: City/ State/ Zip: [place]  [address] Client [place] [facility] - Client Client Site [place] - Day Provider Alma Friendly - NP Contact Type Call Who Is Calling Patient / Member / Family / Caregiver Call Type Triage / Clinical Relationship To Patient Self Return Phone Number [phone] (Primary) Chief Complaint Headache Reason for Call Symptomatic / Request for [name] states that she is having constant headaches, light headed and possible vertigo. No fever at this time. Translation No Nurse Assessment Nurse: Fredderick Phenix, RN, Lelan Pons Date/Time Eilene Ghazi Time): 09/06/2021 9:44:13 AM Confirm and document reason for call. If symptomatic, describe symptoms. ---Caller states that she is having constant headaches all week. Sometimes light headed when she stands up. States she is staying hydrated. States she is having some vertigo too. Does the patient have any new or worsening symptoms? ---Yes Will a triage be completed? ---Yes Related visit to physician within the last 2 weeks? ---No Does the PT have any chronic conditions? (i.e. diabetes, asthma, this includes High risk factors for pregnancy, etc.) ---No Is this a behavioral health or substance abuse call? ---No Guidelines Guideline Title Affirmed Question Affirmed Notes Nurse Date/Time Eilene Ghazi Time) Headache [1] SEVERE headache (e.g., excruciating) AND [2] "worst headache" of life Stormy Card 09/06/2021 9:45:32 AM Disp. Time Eilene Ghazi Time) Disposition Final User 09/06/2021 9:48:27 AM Go to ED Now (or PCP triage) Yes Fredderick Phenix, RN, Karie Chimera NOTE: All timestamps contained within this report are represented as  [place] Standard Time. CONFIDENTIALTY NOTICE: This fax transmission is intended only for the addressee. It contains information that is legally privileged, confidential or otherwise protected from use or disclosure. If you are not the intended recipient, you are strictly prohibited from reviewing, disclosing, copying using or disseminating any of this information or taking any action in reliance on or regarding this information. If you have received this fax in error, please notify us immediately by telephone so that we can arrange for its return to [place]. Phone: [phone], Toll-Free: [phone], Fax: [phone] Page: 2 of 2 Call Id: [id number] Final Disposition 09/06/2021 9:48:27 AM Go to ED Now (or PCP triage) Yes Fredderick Phenix, RN, [place] Disagree/Comply Comply Caller Understands Yes PreDisposition Did not know what to do Care Advice Given Per Guideline GO TO ED NOW (OR PCP TRIAGE): * IF NO PCP (PRIMARY CARE PROVIDER) SECOND-LEVEL TRIAGE: You need to be seen within the next hour. Go to the [facility] at _____________ [place] as soon as you can. CARE ADVICE given per Headache (Adult) guideline. ANOTHER ADULT SHOULD DRIVE: * It is better and safer if another adult drives instead of you. Referrals GO TO FACILITY UNDECIDE

## 2021-09-07 ENCOUNTER — Encounter: Payer: Self-pay | Admitting: Primary Care

## 2021-09-07 ENCOUNTER — Ambulatory Visit: Payer: Managed Care, Other (non HMO) | Admitting: Primary Care

## 2021-09-07 VITALS — BP 134/78 | HR 76 | Temp 98.7°F | Ht 66.0 in | Wt 177.0 lb

## 2021-09-07 DIAGNOSIS — R42 Dizziness and giddiness: Secondary | ICD-10-CM | POA: Diagnosis not present

## 2021-09-07 DIAGNOSIS — M542 Cervicalgia: Secondary | ICD-10-CM | POA: Diagnosis not present

## 2021-09-07 DIAGNOSIS — G8929 Other chronic pain: Secondary | ICD-10-CM | POA: Diagnosis not present

## 2021-09-07 DIAGNOSIS — M26609 Unspecified temporomandibular joint disorder, unspecified side: Secondary | ICD-10-CM | POA: Insufficient documentation

## 2021-09-07 MED ORDER — CYCLOBENZAPRINE HCL 5 MG PO TABS: 5.0000 mg | ORAL_TABLET | Freq: Three times a day (TID) | ORAL | 0 refills | Status: DC | PRN

## 2021-09-07 NOTE — Assessment & Plan Note (Signed)
Suspect TMJ evaluation has triggered a lot of her symptoms.  Rx for cyclobenzaprine 5 mg HS provided today for TMJ. Start Meloxicam 15 mg daily, she has this Rx at home.  Return precautions provided.

## 2021-09-07 NOTE — Patient Instructions (Signed)
Start the cyclobenzaprine 5 mg tablets for TMJ and your neck pain. Take 1 tablet by mouth at bedtime.  Start Meloxicam.  Try using Flonase (fluticasone) nasal spray. Instill 1 spray in each nostril twice daily.   It was a pleasure to see you today!

## 2021-09-07 NOTE — Assessment & Plan Note (Signed)
Acute on chronic flare secondary to TMJ evaluation.   Start cyclobenzaprine 5 mg HS PRN.  Follow up with physiatry as scheduled.

## 2021-09-07 NOTE — Assessment & Plan Note (Signed)
Suspect symptoms were triggered after her TMJ exam.   Neuro exam unremarkable. She can ambulate without assistance.  Fortunately she seems to be improving.  Discussed to start Flonase BID for tinnitus and dizziness. Rx for cyclobenzaprine provided for TMJ pain and neck pain.  Discussed stretching.  Return precautions provided.

## 2021-09-07 NOTE — Progress Notes (Signed)
Subjective:    Patient ID: Ashley [name], female    DOB: [birth year], 56 y.o.   MRN: [id number]  Headache  Associated symptoms include dizziness, neck pain and tinnitus.    Ashley [name] is a very pleasant 56 y.o. female with a history of hypertension, osteoarthritis, neuropathy, cervical radiculitis, paresthesias, prediabetes, anxiety and depression who presents today headache and dizziness.   Symptom onset two weeks ago with "vertigo", room spinning sensation while she was sitting still. This lasted about one week. She also developed consistent neck pain which began about 2 weeks ago. Four days ago she developed dizziness with head movements. The following day she developed daily headaches that are located to either temporal region.now she's developed ringing in her ears.   Evaluated with a TMJ specialist two weeks ago for some chronic jaw pain. The exam lasted for one hour for which was uncomfortable. Her jaw was open for prolonged periods of time.   She saw her physiatrist a few days ago who will be providing her with a cortisone injection to her neck.   Today she endorses dizziness mostly when initially getting up from a seated position, lasts for about one minute, then symptoms will abate.   She's taken Motrin.    Review of Systems  HENT:  Positive for tinnitus.   Musculoskeletal:  Positive for neck pain.  Neurological:  Positive for dizziness and headaches.         Past Medical History:  Diagnosis Date   Abdominal pain 06/23/2018   Acute midline low back pain without sciatica 09/10/2019   Adverse effect of drug 08/15/2019   Anemia    Bronchitis    2 years ago    [facility] arthritis 2019   base of left thumb    Constipation 04/07/2019   Discoloration of skin 05/17/2021   GERD (gastroesophageal reflux disease)    diet controlled , no meds   Hydrosalpinx    bilateral    Hyperlipidemia    recent dx - currently diet control - no meds   Hypertension     Hypothyroid 2003   radio active iodine - no meds   Left-sided weakness 02/12/2019   Ovarian cyst, left 02/23/2016   S/P shoulder surgery 02/28/2015   SVD (spontaneous vaginal delivery)    x 1   Urinary frequency 11/30/2018   Vitamin B 12 deficiency     Social History   Socioeconomic History   Marital status: Single    Spouse name: Not on file   Number of children: Not on file   Years of education: Not on file   Highest education level: Not on file  Occupational History   Not on file  Tobacco Use   Smoking status: Never   Smokeless tobacco: Never  Vaping Use   Vaping Use: Never used  Substance and Sexual Activity   Alcohol use: No   Drug use: No   Sexual activity: Yes    Partners: Male    Birth control/protection: Surgical    Comment: hysterectomy.  Other Topics Concern   Not on file  Social History Narrative   Single.   3 children.   Works at [employer] as a Designer, multimedia.   Enjoys relaxing, exercise, walking her dog.    Social Determinants of Health   Financial Resource Strain: Not on file  Food Insecurity: Not on file  Transportation Needs: Not on file  Physical Activity: Not on file  Stress: Not on file  Social Connections: Not on  file  Intimate Partner Violence: Not on file    Past Surgical History:  Procedure Laterality Date   CESAREAN SECTION     x 2   CHOLECYSTECTOMY  2008   COLONOSCOPY WITH PROPOFOL N/A 08/06/2021   Procedure: COLONOSCOPY WITH PROPOFOL;  Surgeon: [name], MD;  Location: [place];  Service: Endoscopy;  Laterality: N/A;   DILATION AND CURETTAGE OF UTERUS     X 2   ESOPHAGOGASTRODUODENOSCOPY (EGD) WITH PROPOFOL N/A 08/06/2021   Procedure: ESOPHAGOGASTRODUODENOSCOPY (EGD) WITH PROPOFOL;  Surgeon: [name], MD;  Location: [place];  Service: Endoscopy;  Laterality: N/A;   LAPAROSCOPIC BILATERAL SALPINGECTOMY Bilateral 07/29/2017   Procedure: LAPAROSCOPIC LEFT SALPINGOOPHRECTOMY: Partial right Salpingectomy;  Surgeon:  Lavonia Drafts, MD;  Location: [place];  Service: Gynecology;  Laterality: Bilateral;   POLYPECTOMY  08/06/2021   Procedure: POLYPECTOMY;  Surgeon: [name], MD;  Location: [place];  Service: Endoscopy;;   SHOULDER SURGERY  2016   arthroscopic   TOTAL ABDOMINAL HYSTERECTOMY  2006   Fibroids, benign pathology; partial hysterectomy per patient    TUBAL LIGATION     WISDOM TOOTH EXTRACTION     at age 2 yr   WRIST ARTHROSCOPY Right 2007    Family History  Problem Relation Age of Onset   Hypertension Mother    Other Father        unknown medical history   Hypertension Maternal Grandmother    Heart failure Maternal Grandmother    Lung cancer Maternal Grandmother    Breast cancer Maternal Aunt    Colon cancer Neg Hx    Colon polyps Neg Hx    Rectal cancer Neg Hx    Stomach cancer Neg Hx     Allergies  Allergen Reactions   Aczone [Dapsone] Swelling   Levaquin [Levofloxacin]     Presumed cause of joint aches    Current Outpatient Medications on File Prior to Visit  Medication Sig Dispense Refill   amLODipine (NORVASC) 5 MG tablet Take 1 tablet (5 mg total) by mouth daily. for blood pressure. 90 tablet 3   aspirin 81 MG EC tablet Take by mouth.     atorvastatin (LIPITOR) 10 MG tablet Take 1 tablet (10 mg total) by mouth daily. for cholesterol. 90 tablet 3   azelastine (ASTELIN) 0.1 % nasal spray Place into the nose.     famotidine (PEPCID) 20 MG tablet Take 1 tablet (20 mg total) by mouth daily.     fluticasone (FLONASE) 50 MCG/ACT nasal spray Place 1 spray into both nostrils 2 (two) times daily. 16 g 2   gabapentin (NEURONTIN) 600 MG tablet Take 1,200 mg by mouth daily.     HYDROcodone-acetaminophen (NORCO/VICODIN) 5-325 MG tablet Take 1-2 tablets by mouth every 6 (six) hours as needed for moderate pain. 20 tablet 0   pantoprazole (PROTONIX) 40 MG tablet Take 1 tablet (40 mg total) by mouth 2 (two) times daily before a meal. 60 tablet 5    valACYclovir (VALTREX) 1000 MG tablet Take 1 tablet (1,000 mg total) by mouth daily. 5 tablet 0   No current facility-administered medications on file prior to visit.    BP 134/78   Pulse 76   Temp 98.7 F (37.1 C) (Temporal)   Ht '5\' 6"'$  (1.676 m)   Wt 177 lb (80.3 kg)   SpO2 98%   BMI 28.57 kg/m  Objective:   Physical Exam Eyes:     Extraocular Movements: Extraocular movements intact.  Cardiovascular:  Rate and Rhythm: Normal rate and regular rhythm.  Pulmonary:     Effort: Pulmonary effort is normal.     Breath sounds: Normal breath sounds.  Musculoskeletal:     Cervical back: Neck supple.  Skin:    General: Skin is warm and dry.  Neurological:     Mental Status: She is oriented to person, place, and time.     Cranial Nerves: No cranial nerve deficit.     Coordination: Coordination normal.           Assessment & Plan:   Problem List Items Addressed This Visit       Musculoskeletal and Integument   TMJ (temporomandibular joint syndrome)    Suspect TMJ evaluation has triggered a lot of her symptoms.  Rx for cyclobenzaprine 5 mg HS provided today for TMJ. Start Meloxicam 15 mg daily, she has this Rx at home.  Return precautions provided.         Other   Chronic neck pain - Primary    Acute on chronic flare secondary to TMJ evaluation.   Start cyclobenzaprine 5 mg HS PRN.  Follow up with physiatry as scheduled.       Relevant Medications   cyclobenzaprine (FLEXERIL) 5 MG tablet   Dizziness    Suspect symptoms were triggered after her TMJ exam.   Neuro exam unremarkable. She can ambulate without assistance.  Fortunately she seems to be improving.  Discussed to start Flonase BID for tinnitus and dizziness. Rx for cyclobenzaprine provided for TMJ pain and neck pain.  Discussed stretching.  Return precautions provided.          Pleas Koch, NP

## 2021-09-25 ENCOUNTER — Ambulatory Visit: Payer: Managed Care, Other (non HMO) | Admitting: Primary Care

## 2021-10-04 ENCOUNTER — Ambulatory Visit: Payer: Managed Care, Other (non HMO) | Admitting: Gastroenterology

## 2021-10-23 ENCOUNTER — Other Ambulatory Visit: Payer: Self-pay

## 2021-10-23 ENCOUNTER — Ambulatory Visit
Admission: EM | Admit: 2021-10-23 | Discharge: 2021-10-23 | Disposition: A | Discharge status: Home / Self Care | Payer: Managed Care, Other (non HMO) | Attending: Family Medicine | Admitting: Family Medicine

## 2021-10-23 ENCOUNTER — Encounter: Payer: Self-pay | Admitting: *Deleted

## 2021-10-23 DIAGNOSIS — M5441 Lumbago with sciatica, right side: Secondary | ICD-10-CM | POA: Diagnosis present

## 2021-10-23 DIAGNOSIS — G8929 Other chronic pain: Secondary | ICD-10-CM | POA: Diagnosis present

## 2021-10-23 DIAGNOSIS — M5416 Radiculopathy, lumbar region: Secondary | ICD-10-CM

## 2021-10-23 LAB — URINALYSIS, ROUTINE W REFLEX MICROSCOPIC
Bilirubin Urine: NEGATIVE
Glucose, UA: NEGATIVE mg/dL
Ketones, ur: NEGATIVE mg/dL
Leukocytes,Ua: NEGATIVE
Nitrite: NEGATIVE
Protein, ur: NEGATIVE mg/dL
Specific Gravity, Urine: <1.005 — ABNORMAL LOW (ref 1.005–1.030)
pH: 6 (ref 5.0–8.0)

## 2021-10-23 LAB — URINALYSIS, MICROSCOPIC (REFLEX)
Bacteria, UA: NONE SEEN
WBC, UA: NONE SEEN WBC/hpf (ref 0–5)

## 2021-10-23 NOTE — Discharge Instructions (Addendum)
Follow-up with your pain specialist regarding your back pain.  I would take ibuprofen 600 mg every 6 hours as needed.  Continue taking your gabapentin as prescribed.  Start your Flexeril at bedtime.  Do not drive or operate heavy machinery while taking this medicine.  Consider buying a rolling backpack.  See handout for rehab exercises.

## 2021-10-23 NOTE — ED Triage Notes (Signed)
PT reports for one week she has had back pain and RT flank pain.

## 2021-10-23 NOTE — ED Provider Notes (Signed)
[facility]    CSN: [id number] Arrival date & time: 10/23/21  1919      History   Chief Complaint Chief Complaint  Patient presents with   Back Pain   Flank Pain    HPI  HPI Ashley [name] is a 56 y.o. female.   Sharonp presents for low back pain that radiates to her right flank for the past week.  States that she has had intermittent back pain for years.  Pain typically wraps around to her right side.  Motrin typically works for her pain.  She wanted to be sure that her pain was nothing more.  Pain radiates down into her right leg.  Pain is described as a burning sensation which is new for her.  She typically gets tingling and pins-and-needles but now it is the burning sensation.  Feels like a hot pan is on her leg.  She takes gabapentin for this as well.  She has some Flexeril but has not used it.  She sees a pain specialist and is due for an epidural next Friday.  Says that she had an MRI of her back a while ago.  Denies recent injury or trauma.  She has not lifted anything heavy to her knowledge.  There is no perianal numbness, bowel or bladder incontinence.  Of note, she recently went back to school and has a heavy book bag.   Fever : no  Perianal numbness: No Bowel incontinence: No Bladder incontinence: No Sore throat: no   Cough: no Appetite: normal  Hydration: normal  Abdominal pain: no Nausea: no Vomiting: no Dysuria: No Hematuria: No Melena/hematochezia: No Urinary frequency: No Urinary urgency: No Vaginal discharge: No Vaginal bleeding: No Sleep disturbance: Yes Neck Pain: Chronic Headache: Intermittent chronic    Past Medical History:  Diagnosis Date   Abdominal pain 06/23/2018   Acute midline low back pain without sciatica 09/10/2019   Adverse effect of drug 08/15/2019   Anemia    Bronchitis    2 years ago    [facility] arthritis 2019   base of left thumb    Constipation 04/07/2019   Discoloration of skin 05/17/2021   GERD  (gastroesophageal reflux disease)    diet controlled , no meds   Hydrosalpinx    bilateral    Hyperlipidemia    recent dx - currently diet control - no meds   Hypertension    Hypothyroid 2003   radio active iodine - no meds   Left-sided weakness 02/12/2019   Ovarian cyst, left 02/23/2016   S/P shoulder surgery 02/28/2015   SVD (spontaneous vaginal delivery)    x 1   Urinary frequency 11/30/2018   Vitamin B 12 deficiency     Patient Active Problem List   Diagnosis Date Noted   TMJ (temporomandibular joint syndrome) 09/07/2021   Diverticulitis of colon    Polyp of sigmoid colon    Urinary frequency 06/29/2021   Acute right flank pain 06/29/2021   Constipation 06/29/2021   Left lower quadrant abdominal pain 06/29/2021   History of diverticulitis 06/29/2021   Adjustment reaction with anxiety and depression 05/09/2021   Palpitations 10/04/2020   Prediabetes 06/15/2020   Chronic hand pain 05/10/2020   Neuropathy 12/13/2019   Lumbar stenosis without neurogenic claudication 12/09/2019   Cervical radiculitis 11/19/2019   Foraminal stenosis of cervical region 11/19/2019   Dizziness 10/07/2019   HSV (herpes simplex virus) anogenital infection 06/25/2019   Encounter for annual general medical examination with abnormal findings in adult  06/23/2019   Chronic neck pain 02/17/2019   HTN (hypertension), malignant    History of TIA (transient ischemic attack)    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   Numbness and tingling 02/01/2019   Ganglion cyst of tendon sheath of left hand 11/13/2018   Acquired trigger finger 10/26/2018   Osteoarthritis 06/30/2018   Vitamin D deficiency 06/23/2018   Vitamin B 12 deficiency 06/23/2018   GERD (gastroesophageal reflux disease) 01/30/2018   Hyperlipidemia 03/19/2017   Allergic rhinitis 03/19/2017   Impingement syndrome of left shoulder 12/27/2014   Impingement syndrome of right shoulder 12/27/2014   Chronic right shoulder pain 11/29/2014    Past  Surgical History:  Procedure Laterality Date   CESAREAN SECTION     x 2   CHOLECYSTECTOMY  2008   COLONOSCOPY WITH PROPOFOL N/A 08/06/2021   Procedure: COLONOSCOPY WITH PROPOFOL;  Surgeon: [name], MD;  Location: [place];  Service: Endoscopy;  Laterality: N/A;   DILATION AND CURETTAGE OF UTERUS     X 2   ESOPHAGOGASTRODUODENOSCOPY (EGD) WITH PROPOFOL N/A 08/06/2021   Procedure: ESOPHAGOGASTRODUODENOSCOPY (EGD) WITH PROPOFOL;  Surgeon: [name], MD;  Location: [place];  Service: Endoscopy;  Laterality: N/A;   LAPAROSCOPIC BILATERAL SALPINGECTOMY Bilateral 07/29/2017   Procedure: LAPAROSCOPIC LEFT SALPINGOOPHRECTOMY: Partial right Salpingectomy;  Surgeon: Lavonia Drafts, MD;  Location: [place];  Service: Gynecology;  Laterality: Bilateral;   POLYPECTOMY  08/06/2021   Procedure: POLYPECTOMY;  Surgeon: [name], MD;  Location: [place];  Service: Endoscopy;;   SHOULDER SURGERY  2016   arthroscopic   TOTAL ABDOMINAL HYSTERECTOMY  2006   Fibroids, benign pathology; partial hysterectomy per patient    TUBAL LIGATION     WISDOM TOOTH EXTRACTION     at age 23 yr   WRIST ARTHROSCOPY Right 2007    OB History     Gravida  5   Para  3   Term  3   Preterm  0   AB  2   Living  3      SAB  2   IAB  0   Ectopic  0   Multiple  0   Live Births  3            Home Medications    Prior to Admission medications   Medication Sig Start Date End Date Taking? Authorizing Provider  amLODipine (NORVASC) 5 MG tablet Take 1 tablet (5 mg total) by mouth daily. for blood pressure. 07/24/21   Pleas Koch, NP  aspirin 81 MG EC tablet Take by mouth. 09/16/19   [provider]  atorvastatin (LIPITOR) 10 MG tablet Take 1 tablet (10 mg total) by mouth daily. for cholesterol. 06/26/21   Pleas Koch, NP  azelastine (ASTELIN) 0.1 % nasal spray Place into the nose. 03/11/19   [provider]   cyclobenzaprine (FLEXERIL) 5 MG tablet Take 1 tablet (5 mg total) by mouth 3 (three) times daily as needed for muscle spasms. 09/07/21   Pleas Koch, NP  famotidine (PEPCID) 20 MG tablet Take 1 tablet (20 mg total) by mouth daily. 10/03/20   Tonia Ghent, MD  fluticasone (FLONASE) 50 MCG/ACT nasal spray Place 1 spray into both nostrils 2 (two) times daily. 11/19/19   Tower, Wynelle Fanny, MD  gabapentin (NEURONTIN) 600 MG tablet Take 1,200 mg by mouth daily. 01/25/20   [provider]  HYDROcodone-acetaminophen (NORCO/VICODIN) 5-325 MG tablet Take 1-2 tablets by mouth every 6 (six)  hours as needed for moderate pain. 06/29/21 06/29/22  Johnn Hai, PA-C  pantoprazole (PROTONIX) 40 MG tablet Take 1 tablet (40 mg total) by mouth 2 (two) times daily before a meal. 07/26/21   [name], MD  valACYclovir (VALTREX) 1000 MG tablet Take 1 tablet (1,000 mg total) by mouth daily. 08/09/21   Michela Pitcher, NP    Family History Family History  Problem Relation Age of Onset   Hypertension Mother    Other Father        unknown medical history   Hypertension Maternal Grandmother    Heart failure Maternal Grandmother    Lung cancer Maternal Grandmother    Breast cancer Maternal Aunt    Colon cancer Neg Hx    Colon polyps Neg Hx    Rectal cancer Neg Hx    Stomach cancer Neg Hx     Social History Social History   Tobacco Use   Smoking status: Never   Smokeless tobacco: Never  Vaping Use   Vaping Use: Never used  Substance Use Topics   Alcohol use: No   Drug use: No     Allergies   Aczone [dapsone] and Levaquin [levofloxacin]   Review of Systems Review of Systems: :negative unless otherwise stated in HPI.      Physical Exam Triage Vital Signs ED Triage Vitals  Enc Vitals Group     BP 10/23/21 1937 (!) 159/93     Pulse Rate 10/23/21 1937 77     Resp 10/23/21 1937 18     Temp 10/23/21 1937 98.6 F (37 C)     Temp src --      SpO2 10/23/21 1937 100 %     Weight --       Height --      Head Circumference --      Peak Flow --      Pain Score 10/23/21 1935 6     Pain Loc --      Pain Edu? --      Excl. in [place]? --    No data found.  Updated Vital Signs BP (!) 159/93   Pulse 77   Temp 98.6 F (37 C)   Resp 18   SpO2 100%   Visual Acuity Right Eye Distance:   Left Eye Distance:   Bilateral Distance:    Right Eye Near:   Left Eye Near:    Bilateral Near:     Physical Exam GEN: well appearing female in no acute distress  CVS: well perfused  RESP: speaking in full sentences without pause, no respiratory distress  MSK:  Lumbar spine: - Inspection: no gross deformity or asymmetry, swelling or ecchymosis. No skin changes - Palpation: TTP over the spinous processes with bilateral paraspinal muscles tenderness, no SI joints b/l - ROM: full active ROM of the lumbar spine in flexion and extension, pain elicited with flexion - Strength: 5/5 strength of lower extremity in L4-S1 nerve root distributions b/l - Neuro: sensation intact in the L4-S1 nerve root distribution b/l, - Special testing: Positive straight leg raise    UC Treatments / Results  Labs (all labs ordered are listed, but only abnormal results are displayed) Labs Reviewed  URINALYSIS, ROUTINE W REFLEX MICROSCOPIC - Abnormal; Notable for the following components:      Result Value   Specific Gravity, Urine <1.005 (*)    Hgb urine dipstick TRACE (*)    All other components within normal limits  URINALYSIS, MICROSCOPIC (REFLEX)  EKG   Radiology No results found.  Procedures Procedures (including critical care time)  Medications Ordered in UC Medications - No data to display  Initial Impression / Assessment and Plan / UC Course  I have reviewed the triage vital signs and the nursing notes.  Pertinent labs & imaging results that were available during my care of the patient were reviewed by me and considered in my medical decision making (see chart for details).         Pt is a 56 y.o.  female with history of chronic lower back pain and neck pain presents for acute low back pain.  She has been wearing a heavy backpack which may be the root of her acute episode of pain.  Exam and history is concerning for radiculopathy.  She follows with physical medicine and rehab.  She is due for an epidural injection.  She had a lumbar MRI on which showed facet osteoarthritis and L4-L5 and L1-L2 noncompressive disc protrusion.  She had a normal EMG in November 2022 by Dr. Melrose Nakayama.  She may warrant updated MRI (nonurgent) given her symptoms are changing.  She is due to have a epidural on Friday.  Advised patient to discuss with her provider about her change in pain.  Patient to gradually return to normal activities, as tolerated and continue ordinary activities within the limits permitted by pain.  Recommended she get a rolling backpack to offload the pressure on her back.  Continue home ibuprofen, gabapentin.  Patient to take her Flexeril at bedtime and as needed throughout the day.  I doubt cauda equina syndrome or progressive major motor weakness today.  Patient to follow up with PM&R as scheduled.  Rehab exercises provided.  Return and ED precautions given.  Discussed MDM, treatment plan and plan for follow-up with patient/parent who agrees with plan.   Final Clinical Impressions(s) / UC Diagnoses   Final diagnoses:  Chronic midline low back pain with right-sided sciatica     Discharge Instructions      Follow-up with your pain specialist regarding your back pain.  I would take ibuprofen 600 mg every 6 hours as needed.  Continue taking your gabapentin as prescribed.  Start your Flexeril at bedtime.  Do not drive or operate heavy machinery while taking this medicine.  Consider buying a rolling backpack.  See handout for rehab exercises.     ED Prescriptions   None    PDMP not reviewed this encounter.   Lyndee Hensen, DO 10/24/21 [phone]

## 2021-10-24 ENCOUNTER — Ambulatory Visit: Payer: Managed Care, Other (non HMO) | Admitting: Gastroenterology

## 2021-11-02 ENCOUNTER — Other Ambulatory Visit: Payer: Self-pay | Admitting: Physician Assistant

## 2021-11-02 DIAGNOSIS — R202 Paresthesia of skin: Secondary | ICD-10-CM

## 2021-11-02 DIAGNOSIS — G459 Transient cerebral ischemic attack, unspecified: Secondary | ICD-10-CM

## 2021-11-02 DIAGNOSIS — R200 Anesthesia of skin: Secondary | ICD-10-CM

## 2021-11-03 ENCOUNTER — Emergency Department: Payer: Managed Care, Other (non HMO)

## 2021-11-03 ENCOUNTER — Encounter: Payer: Self-pay | Admitting: Emergency Medicine

## 2021-11-03 ENCOUNTER — Other Ambulatory Visit: Payer: Self-pay

## 2021-11-03 ENCOUNTER — Emergency Department
Admission: EM | Admit: 2021-11-03 | Discharge: 2021-11-03 | Disposition: A | Discharge status: Home / Self Care | Payer: Managed Care, Other (non HMO) | Attending: Emergency Medicine | Admitting: Emergency Medicine

## 2021-11-03 DIAGNOSIS — Z79899 Other long term (current) drug therapy: Secondary | ICD-10-CM | POA: Diagnosis not present

## 2021-11-03 DIAGNOSIS — E039 Hypothyroidism, unspecified: Secondary | ICD-10-CM | POA: Insufficient documentation

## 2021-11-03 DIAGNOSIS — I10 Essential (primary) hypertension: Secondary | ICD-10-CM

## 2021-11-03 DIAGNOSIS — R002 Palpitations: Secondary | ICD-10-CM | POA: Insufficient documentation

## 2021-11-03 DIAGNOSIS — Z7982 Long term (current) use of aspirin: Secondary | ICD-10-CM | POA: Diagnosis not present

## 2021-11-03 LAB — BASIC METABOLIC PANEL
Anion gap: 12 (ref 5–15)
BUN: 14 mg/dL (ref 6–20)
CO2: 24 mmol/L (ref 22–32)
Calcium: 10.6 mg/dL — ABNORMAL HIGH (ref 8.9–10.3)
Chloride: 102 mmol/L (ref 98–111)
Creatinine, Ser: 0.82 mg/dL (ref 0.44–1.00)
GFR, Estimated: >60 mL/min (ref >60)
Glucose, Bld: 190 mg/dL — ABNORMAL HIGH (ref 70–99)
Potassium: 4.4 mmol/L (ref 3.5–5.1)
Sodium: 138 mmol/L (ref 135–145)

## 2021-11-03 LAB — CBC
HCT: 44.6 % (ref 36.0–46.0)
Hemoglobin: 14.2 g/dL (ref 12.0–15.0)
MCH: 27.3 pg (ref 26.0–34.0)
MCHC: 31.8 g/dL (ref 30.0–36.0)
MCV: 85.6 fL (ref 80.0–100.0)
Platelets: 302 10*3/uL (ref 150–400)
RBC: 5.21 MIL/uL — ABNORMAL HIGH (ref 3.87–5.11)
RDW: 14.6 % (ref 11.5–15.5)
WBC: 5.7 10*3/uL (ref 4.0–10.5)
nRBC: 0 % (ref 0.0–0.2)

## 2021-11-03 LAB — TSH: TSH: 0.702 u[IU]/mL (ref 0.350–4.500)

## 2021-11-03 LAB — TROPONIN I (HIGH SENSITIVITY)
Troponin I (High Sensitivity): 3 ng/L (ref <18)
Troponin I (High Sensitivity): 5 ng/L (ref <18)

## 2021-11-03 LAB — MAGNESIUM: Magnesium: 2.1 mg/dL (ref 1.7–2.4)

## 2021-11-03 MED ORDER — SODIUM CHLORIDE 0.9 % IV BOLUS
1000.0000 mL | Freq: Once | INTRAVENOUS | Status: AC
Start: 2021-11-03 — End: 2021-11-03
  Administered 2021-11-03: 1000 mL via INTRAVENOUS

## 2021-11-03 MED ORDER — KETOROLAC TROMETHAMINE 30 MG/ML IJ SOLN
15.0000 mg | Freq: Once | INTRAMUSCULAR | Status: AC
Start: 2021-11-03 — End: 2021-11-03
  Administered 2021-11-03: 15 mg via INTRAVENOUS
  Filled 2021-11-03: qty 1

## 2021-11-03 MED ORDER — HYDROCODONE-ACETAMINOPHEN 5-325 MG PO TABS: 1.0000 | ORAL_TABLET | Freq: Four times a day (QID) | ORAL | 0 refills | Status: DC | PRN

## 2021-11-03 NOTE — ED Notes (Signed)
ED Provider at bedside. 

## 2021-11-03 NOTE — Discharge Instructions (Signed)
You may take Norco as needed for pain.  Keep a log of your blood pressures morning, noon and evening.  Return to the ER for worsening symptoms, persistent vomiting, difficulty breathing or other concerns.

## 2021-11-03 NOTE — ED Notes (Signed)
This RN was unsuccessful at an IV attempt. Charge RN Annie Main notified.

## 2021-11-03 NOTE — ED Triage Notes (Signed)
Pt reports she had an epidural injection for back pain yesterday. Pt reports she developed a headache and took some ibuprofen. Pt reports she started to feel some palpitations and her BP was elevated at home. Pt talks in complete sentences no distress noted

## 2021-11-03 NOTE — ED Provider Notes (Signed)
[facility] Provider Note    Event Date/Time   First MD Initiated Contact with Patient 11/03/21 [phone]     (approximate)   History   Palpitations   HPI  Chantee Cerino is a 56 y.o. female who presents to the ED from home with a chief complaint of palpitations.  Patient received an epidural steroid lower back injection yesterday along with recommendations to increase her gabapentin frequency.  Comes in tonight for palpitations at home.  Felt like her heart was racing without associated chest pain or tightness.  States her blood pressure was elevated at home.  Also endorses headache which is normal for her postinjection.  Denies recent fever, cough, shortness of breath, abdominal pain, nausea or vomiting.  States she felt lightheaded at home.     Past Medical History   Past Medical History:  Diagnosis Date   Abdominal pain 06/23/2018   Acute midline low back pain without sciatica 09/10/2019   Adverse effect of drug 08/15/2019   Anemia    Bronchitis    2 years ago    [facility] arthritis 2019   base of left thumb    Constipation 04/07/2019   Discoloration of skin 05/17/2021   GERD (gastroesophageal reflux disease)    diet controlled , no meds   Hydrosalpinx    bilateral    Hyperlipidemia    recent dx - currently diet control - no meds   Hypertension    Hypothyroid 2003   radio active iodine - no meds   Left-sided weakness 02/12/2019   Ovarian cyst, left 02/23/2016   S/P shoulder surgery 02/28/2015   SVD (spontaneous vaginal delivery)    x 1   Urinary frequency 11/30/2018   Vitamin B 12 deficiency      Active Problem List   Patient Active Problem List   Diagnosis Date Noted   TMJ (temporomandibular joint syndrome) 09/07/2021   Diverticulitis of colon    Polyp of sigmoid colon    Urinary frequency 06/29/2021   Acute right flank pain 06/29/2021   Constipation 06/29/2021   Left lower quadrant abdominal pain 06/29/2021   History of  diverticulitis 06/29/2021   Adjustment reaction with anxiety and depression 05/09/2021   Palpitations 10/04/2020   Prediabetes 06/15/2020   Chronic hand pain 05/10/2020   Neuropathy 12/13/2019   Lumbar stenosis without neurogenic claudication 12/09/2019   Cervical radiculitis 11/19/2019   Foraminal stenosis of cervical region 11/19/2019   Dizziness 10/07/2019   HSV (herpes simplex virus) anogenital infection 06/25/2019   Encounter for annual general medical examination with abnormal findings in adult 06/23/2019   Chronic neck pain 02/17/2019   HTN (hypertension), malignant    History of TIA (transient ischemic attack)    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   Numbness and tingling 02/01/2019   Ganglion cyst of tendon sheath of left hand 11/13/2018   Acquired trigger finger 10/26/2018   Osteoarthritis 06/30/2018   Vitamin D deficiency 06/23/2018   Vitamin B 12 deficiency 06/23/2018   GERD (gastroesophageal reflux disease) 01/30/2018   Hyperlipidemia 03/19/2017   Allergic rhinitis 03/19/2017   Impingement syndrome of left shoulder 12/27/2014   Impingement syndrome of right shoulder 12/27/2014   Chronic right shoulder pain 11/29/2014     Past Surgical History   Past Surgical History:  Procedure Laterality Date   CESAREAN SECTION     x 2   CHOLECYSTECTOMY  2008   COLONOSCOPY WITH PROPOFOL N/A 08/06/2021   Procedure: COLONOSCOPY WITH PROPOFOL;  Surgeon: Allen Norris,  Darren, MD;  Location: [place];  Service: Endoscopy;  Laterality: N/A;   DILATION AND CURETTAGE OF UTERUS     X 2   ESOPHAGOGASTRODUODENOSCOPY (EGD) WITH PROPOFOL N/A 08/06/2021   Procedure: ESOPHAGOGASTRODUODENOSCOPY (EGD) WITH PROPOFOL;  Surgeon: [name], MD;  Location: [place];  Service: Endoscopy;  Laterality: N/A;   LAPAROSCOPIC BILATERAL SALPINGECTOMY Bilateral 07/29/2017   Procedure: LAPAROSCOPIC LEFT SALPINGOOPHRECTOMY: Partial right Salpingectomy;  Surgeon: Lavonia Drafts, MD;   Location: [place];  Service: Gynecology;  Laterality: Bilateral;   POLYPECTOMY  08/06/2021   Procedure: POLYPECTOMY;  Surgeon: [name], MD;  Location: [place];  Service: Endoscopy;;   SHOULDER SURGERY  2016   arthroscopic   TOTAL ABDOMINAL HYSTERECTOMY  2006   Fibroids, benign pathology; partial hysterectomy per patient    TUBAL LIGATION     WISDOM TOOTH EXTRACTION     at age 54 yr   WRIST ARTHROSCOPY Right 2007     Home Medications   Prior to Admission medications   Medication Sig Start Date End Date Taking? Authorizing Provider  amLODipine (NORVASC) 5 MG tablet Take 1 tablet (5 mg total) by mouth daily. for blood pressure. 07/24/21   Pleas Koch, NP  aspirin 81 MG EC tablet Take by mouth. 09/16/19   [provider]  atorvastatin (LIPITOR) 10 MG tablet Take 1 tablet (10 mg total) by mouth daily. for cholesterol. 06/26/21   Pleas Koch, NP  azelastine (ASTELIN) 0.1 % nasal spray Place into the nose. 03/11/19   [provider]  cyclobenzaprine (FLEXERIL) 5 MG tablet Take 1 tablet (5 mg total) by mouth 3 (three) times daily as needed for muscle spasms. 09/07/21   Pleas Koch, NP  famotidine (PEPCID) 20 MG tablet Take 1 tablet (20 mg total) by mouth daily. 10/03/20   Tonia Ghent, MD  fluticasone (FLONASE) 50 MCG/ACT nasal spray Place 1 spray into both nostrils 2 (two) times daily. 11/19/19   Tower, Wynelle Fanny, MD  gabapentin (NEURONTIN) 600 MG tablet Take 1,200 mg by mouth daily. 01/25/20   [provider]  HYDROcodone-acetaminophen (NORCO/VICODIN) 5-325 MG tablet Take 1-2 tablets by mouth every 6 (six) hours as needed for moderate pain. 06/29/21 06/29/22  Johnn Hai, PA-C  pantoprazole (PROTONIX) 40 MG tablet Take 1 tablet (40 mg total) by mouth 2 (two) times daily before a meal. 07/26/21   [name], MD  valACYclovir (VALTREX) 1000 MG tablet Take 1 tablet (1,000 mg total) by mouth daily. 08/09/21   Michela Pitcher, NP     Allergies  Aczone [dapsone] and Levaquin [levofloxacin]   Family History   Family History  Problem Relation Age of Onset   Hypertension Mother    Other Father        unknown medical history   Hypertension Maternal Grandmother    Heart failure Maternal Grandmother    Lung cancer Maternal Grandmother    Breast cancer Maternal Aunt    Colon cancer Neg Hx    Colon polyps Neg Hx    Rectal cancer Neg Hx    Stomach cancer Neg Hx      Physical Exam  Triage Vital Signs: ED Triage Vitals  Enc Vitals Group     BP 11/03/21 0055 (!) 184/118     Pulse Rate 11/03/21 0055 (!) 143     Resp 11/03/21 0055 20     Temp 11/03/21 0055 98.6 F (37 C)     Temp Source 11/03/21  0055 Oral     SpO2 11/03/21 0055 100 %     Weight 11/03/21 0056 177 lb (80.3 kg)     Height 11/03/21 0056 '5\' 5"'$  (1.651 m)     Head Circumference --      Peak Flow --      Pain Score 11/03/21 0056 5     Pain Loc --      Pain Edu? --      Excl. in [place]? --     Updated Vital Signs: BP (!) 148/95   Pulse 83   Temp 98.6 F (37 C) (Oral)   Resp 20   Ht '5\' 5"'$  (1.651 m)   Wt 80.3 kg   SpO2 100%   BMI 29.45 kg/m    General: Awake, no distress.  CV:  RRR.  Good peripheral perfusion.  Resp:  Normal effort.  CTA B. Abd:  Nontender.  No distention.  Other:  Sites of lower back injections to either side of the spine clean/dry/intact without warmth, erythema or fluctuance.   ED Results / Procedures / Treatments  Labs (all labs ordered are listed, but only abnormal results are displayed) Labs Reviewed  BASIC METABOLIC PANEL - Abnormal; Notable for the following components:      Result Value   Glucose, Bld 190 (*)    Calcium 10.6 (*)    All other components within normal limits  CBC - Abnormal; Notable for the following components:   RBC 5.21 (*)    All other components within normal limits  MAGNESIUM  TSH  POC URINE PREG, ED  TROPONIN I (HIGH SENSITIVITY)  TROPONIN I (HIGH SENSITIVITY)      EKG  ED ECG REPORT I, Alasia Enge J, the attending physician, personally viewed and interpreted this ECG.   Date: 11/03/2021  EKG Time: 0051  Rate: 129  Rhythm: sinus tachycardia  Axis: Normal  Intervals:none  ST&T Change: Nonspecific    RADIOLOGY I have independently visualized and interpreted patient's chest x-ray as well as noted the radiology interpretation:  X-ray: No acute cardiopulmonary process  Official radiology report(s): DG Chest 2 View  Result Date: 11/03/2021 CLINICAL DATA:  Chest pain EXAM: CHEST - 2 VIEW COMPARISON:  Radiographs 08/22/2005 FINDINGS: No focal consolidation, pleural effusion, or pneumothorax. Normal cardiomediastinal silhouette. No acute osseous abnormality. IMPRESSION: No active cardiopulmonary disease. Electronically Signed   By: Placido Sou M.D.   On: 11/03/2021 01:27     PROCEDURES:  Critical Care performed: No  .1-3 Lead EKG Interpretation  Performed by: Paulette Blanch, MD Authorized by: Paulette Blanch, MD     Interpretation: normal     ECG rate:  99   ECG rate assessment: normal     Rhythm: sinus rhythm     Ectopy: none     Conduction: normal   Comments:     Patient placed on cardiac monitor to evaluate for arrhythmias    MEDICATIONS ORDERED IN ED: Medications  sodium chloride 0.9 % bolus 1,000 mL (0 mLs Intravenous Paused 11/03/21 0542)  ketorolac (TORADOL) 30 MG/ML injection 15 mg (15 mg Intravenous Given 11/03/21 0539)     IMPRESSION / MDM / ASSESSMENT AND PLAN / ED COURSE  I reviewed the triage vital signs and the nursing notes.                             56 year old female presenting with palpitations.Differential diagnosis includes, but is not limited to, ACS, aortic  dissection, pulmonary embolism, cardiac tamponade, pneumothorax, pneumonia, pericarditis, myocarditis, GI-related causes including esophagitis/gastritis, and musculoskeletal chest wall pain.   I have personally reviewed patient's records and note her  neurology office visit from 11/01/2021.  Patient's presentation is most consistent with acute presentation with potential threat to life or bodily function.  The patient is on the cardiac monitor to evaluate for evidence of arrhythmia and/or significant heart rate changes.  Laboratory results demonstrate normal WBC 5.7, normal electrolytes, magnesium and thyroid panel.  2 sets of troponins are unremarkable, chest x-ray negative.  Palpitations and elevated blood pressure may be secondary to steroid injection, increase gabapentin frequency or pain.  Will initiate IV fluid hydration, IV ketorolac for headache and reassess.  Low suspicion for PE or dissection.  Clinical Course as of 11/03/21 0659  Sat Nov 03, 2021  0657 Patient feeling better.  Blood pressure 148/95.  Will hold off adjusting patient's amlodipine for now.  She will record a log of her blood pressures and take these to her doctor when she follows up.  Heart rate normalized.  Will place internal cardiology referral to follow-up palpitations.  Will discharge home with as needed prescription for Norco to use as needed.  Strict return precautions given.  Patient verbalizes understanding agrees with plan of care. [JS]    Clinical Course User Index [JS] Paulette Blanch, MD     FINAL CLINICAL IMPRESSION(S) / ED DIAGNOSES   Final diagnoses:  Heart palpitations  Essential hypertension     Rx / DC Orders   ED Discharge Orders     None        Note:  This document was prepared using Dragon voice recognition software and may include unintentional dictation errors.   Paulette Blanch, MD 11/03/21 [phone]

## 2021-11-21 ENCOUNTER — Ambulatory Visit
Admission: RE | Admit: 2021-11-21 | Discharge: 2021-11-21 | Disposition: A | Discharge status: Home / Self Care | Payer: Managed Care, Other (non HMO) | Source: Ambulatory Visit | Attending: Physician Assistant | Admitting: Physician Assistant

## 2021-11-21 DIAGNOSIS — G459 Transient cerebral ischemic attack, unspecified: Secondary | ICD-10-CM

## 2021-11-21 DIAGNOSIS — R200 Anesthesia of skin: Secondary | ICD-10-CM

## 2021-11-21 DIAGNOSIS — R202 Paresthesia of skin: Secondary | ICD-10-CM

## 2021-11-21 MED ORDER — GADOBENATE DIMEGLUMINE 529 MG/ML IV SOLN
15.0000 mL | Freq: Once | INTRAVENOUS | Status: AC | PRN
  Administered 2021-11-21: 15 mL via INTRAVENOUS

## 2021-12-04 NOTE — Progress Notes (Unsigned)
Cardiology Office Note:    Date:  12/05/2021   ID:  Avneet Ashmore, DOB 1965/11/08, MRN [id number]  PCP:  Pleas Koch, NP   [place] Providers Cardiologist:  None     Referring MD: Pleas Koch, NP   CC: Palpitations Consulted for the evaluation of palpitations at the behest of Ms. Clark NP  History of Present Illness:    Curtistine Pettitt is a 56 y.o. female with a hx of HTN, HLD, and dizziness seen in 2023 for palpitations.  Patient notes that she is feeling in some pain.   September has very high blood pressure-> she increased gabapentin without improvement in her back pain (only some).  She got a back injection and then had very high blood pressure.  Had elevated heart rates (sinus tachycardia) at that time.  Patient has good and bad days. Notes fatigue.  Notes that she is having sleep issues.  [place] 2  Notes rare chest discomfort: under her left breast.  Spontaneous in nature. Walks every day. 2 ish miles a day. Does not produce her symptoms.  No shortness of breath, DOE .  No PND or orthopnea.  No weight gain, leg swelling , or abdominal swelling.  No syncope or near syncope .  Occasional light headedness, this can happen more frequently. Palpitations 2-3 times a week.  Patient reports prior cardiac testing including echo for question of TIA (couldn't move her leg, no residual deficit).  Ambulatory BP not done.   Past Medical History:  Diagnosis Date   Abdominal pain 06/23/2018   Acute midline low back pain without sciatica 09/10/2019   Adverse effect of drug 08/15/2019   Anemia    Bronchitis    2 years ago    [facility] arthritis 2019   base of left thumb    Constipation 04/07/2019   Discoloration of skin 05/17/2021   GERD (gastroesophageal reflux disease)    diet controlled , no meds   Hydrosalpinx    bilateral    Hyperlipidemia    recent dx - currently diet control - no meds   Hypertension    Hypothyroid 2003    radio active iodine - no meds   Left-sided weakness 02/12/2019   Ovarian cyst, left 02/23/2016   S/P shoulder surgery 02/28/2015   SVD (spontaneous vaginal delivery)    x 1   Urinary frequency 11/30/2018   Vitamin B 12 deficiency     Past Surgical History:  Procedure Laterality Date   CESAREAN SECTION     x 2   CHOLECYSTECTOMY  2008   COLONOSCOPY WITH PROPOFOL N/A 08/06/2021   Procedure: COLONOSCOPY WITH PROPOFOL;  Surgeon: [name], MD;  Location: [place];  Service: Endoscopy;  Laterality: N/A;   DILATION AND CURETTAGE OF UTERUS     X 2   ESOPHAGOGASTRODUODENOSCOPY (EGD) WITH PROPOFOL N/A 08/06/2021   Procedure: ESOPHAGOGASTRODUODENOSCOPY (EGD) WITH PROPOFOL;  Surgeon: [name], MD;  Location: [place];  Service: Endoscopy;  Laterality: N/A;   LAPAROSCOPIC BILATERAL SALPINGECTOMY Bilateral 07/29/2017   Procedure: LAPAROSCOPIC LEFT SALPINGOOPHRECTOMY: Partial right Salpingectomy;  Surgeon: Lavonia Drafts, MD;  Location: [facility];  Service: Gynecology;  Laterality: Bilateral;   POLYPECTOMY  08/06/2021   Procedure: POLYPECTOMY;  Surgeon: [name], MD;  Location: [facility];  Service: Endoscopy;;   SHOULDER SURGERY  2016   arthroscopic   TOTAL ABDOMINAL HYSTERECTOMY  2006   Fibroids, benign pathology; partial hysterectomy per patient    TUBAL LIGATION  WISDOM TOOTH EXTRACTION     at age 24 yr   WRIST ARTHROSCOPY Right 2007    Current Medications: Current Meds  Medication Sig   amLODipine (NORVASC) 5 MG tablet Take 1 tablet (5 mg total) by mouth daily. for blood pressure.   aspirin 81 MG EC tablet Take by mouth.   atorvastatin (LIPITOR) 10 MG tablet Take 1 tablet (10 mg total) by mouth daily. for cholesterol.   azelastine (ASTELIN) 0.1 % nasal spray Place into the nose.   cetirizine (ZYRTEC) 10 MG tablet as needed for allergies.   cyclobenzaprine (FLEXERIL) 5 MG tablet Take 1 tablet (5 mg total) by mouth 3  (three) times daily as needed for muscle spasms.   fluticasone (FLONASE) 50 MCG/ACT nasal spray Place 1 spray into both nostrils 2 (two) times daily.   gabapentin (NEURONTIN) 600 MG tablet Take 1,200 mg by mouth daily.   meclizine (ANTIVERT) 25 MG tablet Take 1 tablet by mouth as needed for dizziness.   meloxicam (MOBIC) 7.5 MG tablet Take 1 tablet by mouth as needed for pain.   pantoprazole (PROTONIX) 20 MG tablet daily.   [DISCONTINUED] famotidine (PEPCID) 20 MG tablet Take 1 tablet (20 mg total) by mouth daily.   [DISCONTINUED] HYDROcodone-acetaminophen (NORCO) 5-325 MG tablet Take 1 tablet by mouth every 6 (six) hours as needed for moderate pain.   [DISCONTINUED] pantoprazole (PROTONIX) 40 MG tablet Take 1 tablet (40 mg total) by mouth 2 (two) times daily before a meal.   [DISCONTINUED] valACYclovir (VALTREX) 1000 MG tablet Take 1 tablet (1,000 mg total) by mouth daily.     Allergies:   Aczone [dapsone] and Levaquin [levofloxacin]   Social History   Socioeconomic History   Marital status: Single    Spouse name: Not on file   Number of children: Not on file   Years of education: Not on file   Highest education level: Not on file  Occupational History   Not on file  Tobacco Use   Smoking status: Never   Smokeless tobacco: Never  Vaping Use   Vaping Use: Never used  Substance and Sexual Activity   Alcohol use: No   Drug use: No   Sexual activity: Yes    Partners: Male    Birth control/protection: Surgical    Comment: hysterectomy.  Other Topics Concern   Not on file  Social History Narrative   Single.   3 children.   Works at [employer] as a Designer, multimedia.   Enjoys relaxing, exercise, walking her dog.    Social Determinants of Health   Financial Resource Strain: Not on file  Food Insecurity: Not on file  Transportation Needs: Not on file  Physical Activity: Not on file  Stress: Not on file  Social Connections: Not on file    Social: ins histology tech school  Family  History: The patient's family history includes Breast cancer in her maternal aunt; Heart failure in her maternal grandmother; Hypertension in her maternal grandmother and mother; Lung cancer in her maternal grandmother; Other in her father. There is no history of Colon cancer, Colon polyps, Rectal cancer, or Stomach cancer.  ROS:   Please see the history of present illness.     All other systems reviewed and are negative.  EKGs/Labs/Other Studies Reviewed:    The following studies were reviewed today:   EKG:  EKG is  ordered today.  The ekg ordered today demonstrates  12/05/21: Sinus tachycardia left atrial enlargement rates 129  ECHO COMPLETE WO IMAGING  ENHANCING AGENT 02/13/2019  Narrative ECHOCARDIOGRAM REPORT    Patient Name:   [facility] Date of Exam: 02/13/2019 Medical Rec #:  [id number]             Height:       66.0 in Accession #:    [id number]            Weight:       165.0 lb Date of Birth:  August 21, 1965            BSA:          1.84 m Patient Age:    83 years              BP:           124/81 mmHg Patient Gender: F                     HR:           71 bpm. Exam Location:  [facility]  Procedure: 2D Echo  Indications:     TIA 435.9/G45.9  History:         Patient has no prior history of Echocardiogram examinations. Risk Factors:Dyslipidemia.  Sonographer:     Avanell Shackleton Referring Phys:  [id number] [place] Diagnosing Phys: Skeet Latch MD  IMPRESSIONS   1. Left ventricular ejection fraction, by visual estimation, is 60 to 65%. The left ventricle has normal function. Left ventricular septal wall thickness was normal. Normal left ventricular posterior wall thickness. There is no left ventricular hypertrophy. 2. The left ventricle has no regional wall motion abnormalities. 3. Global right ventricle has normal systolic function.The right ventricular size is normal. No increase in right ventricular wall thickness. 4. Left atrial size was normal. 5.  Right atrial size was normal. 6. The mitral valve is normal in structure. No evidence of mitral valve regurgitation. No evidence of mitral stenosis. 7. The tricuspid valve is normal in structure. Tricuspid valve regurgitation is mild. 8. The aortic valve is tricuspid. Aortic valve regurgitation is not visualized. No evidence of aortic valve sclerosis or stenosis. 9. The pulmonic valve was normal in structure. Pulmonic valve regurgitation is trivial. 10. Normal pulmonary artery systolic pressure. 11. The inferior vena cava is normal in size with greater than 50% respiratory variability, suggesting right atrial pressure of 3 mmHg.  FINDINGS Left Ventricle: Left ventricular ejection fraction, by visual estimation, is 60 to 65%. The left ventricle has normal function. The left ventricle has no regional wall motion abnormalities. Normal left ventricular posterior wall thickness. There is no left ventricular hypertrophy. Left ventricular diastolic parameters were normal. Normal left atrial pressure.  Right Ventricle: The right ventricular size is normal. No increase in right ventricular wall thickness. Global RV systolic function is has normal systolic function. The tricuspid regurgitant velocity is 2.02 m/s, and with an assumed right atrial pressure of 3 mmHg, the estimated right ventricular systolic pressure is normal at 19.2 mmHg.  Left Atrium: Left atrial size was normal in size.  Right Atrium: Right atrial size was normal in size  Pericardium: There is no evidence of pericardial effusion.  Mitral Valve: The mitral valve is normal in structure. No evidence of mitral valve regurgitation. No evidence of mitral valve stenosis by observation.  Tricuspid Valve: The tricuspid valve is normal in structure. Tricuspid valve regurgitation is mild.  Aortic Valve: The aortic valve is tricuspid. Aortic valve regurgitation is not visualized. The aortic valve is structurally normal, with no evidence of  sclerosis or stenosis.  Pulmonic Valve: The pulmonic valve was normal in structure. Pulmonic valve regurgitation is trivial. Pulmonic regurgitation is trivial.  Aorta: The aortic root, ascending aorta and aortic arch are all structurally normal, with no evidence of dilitation or obstruction.  Venous: The inferior vena cava is normal in size with greater than 50% respiratory variability, suggesting right atrial pressure of 3 mmHg.  IAS/Shunts: No atrial level shunt detected by color flow Doppler. There is no evidence of a patent foramen ovale. No ventricular septal defect is seen or detected. There is no evidence of an atrial septal defect.   LEFT VENTRICLE PLAX 2D LVIDd:         4.33 cm  Diastology LVIDs:         2.74 cm  LV e' lateral:   10.40 cm/s LV PW:         0.95 cm  LV E/e' lateral: 9.1 LV IVS:        0.99 cm  LV e' medial:    7.07 cm/s LVOT diam:     2.20 cm  LV E/e' medial:  13.5 LV SV:         56 ml LV SV Index:   29.98 LVOT Area:     3.80 cm   RIGHT VENTRICLE             IVC RV S prime:     15.30 cm/s  IVC diam: 1.00 cm  LEFT ATRIUM           Index       RIGHT ATRIUM           Index LA diam:      3.70 cm 2.01 cm/m  RA Area:     12.60 cm LA Vol (A4C): 40.4 ml 21.92 ml/m RA Volume:   25.30 ml  13.73 ml/m  AORTA Ao Root diam: 3.20 cm  MITRAL VALVE                        TRICUSPID VALVE MV Area (PHT): 3.60 cm             TR Peak grad:   16.2 mmHg MV PHT:        61.19 msec           TR Vmax:        233.00 cm/s MV Decel Time: 211 msec MV E velocity: 95.10 cm/s 103 cm/s  SHUNTS MV A velocity: 78.60 cm/s 70.3 cm/s Systemic Diam: 2.20 cm MV E/A ratio:  1.21       1.5   Recent Labs: 06/29/2021: ALT 21 11/03/2021: BUN 14; Creatinine, Ser 0.82; Hemoglobin 14.2; Magnesium 2.1; Platelets 302; Potassium 4.4; Sodium 138; TSH 0.702  Recent Lipid Panel    Component Value Date/Time   CHOL 174 06/26/2021 1323   CHOL 247 (H) 05/22/2020 1552   TRIG 65.0 06/26/2021 1323   HDL  70.00 06/26/2021 1323   HDL 81 05/22/2020 1552   CHOLHDL 2 06/26/2021 1323   VLDL 13.0 06/26/2021 1323   LDLCALC 91 06/26/2021 1323   LDLCALC 145 (H) 05/22/2020 1552       The 10-year ASCVD risk score (Arnett DK, et al., 2019) is: 4.8%   Values used to calculate the score:     Age: 32 years     Sex: Female     Is Non-Hispanic African American: Yes     Diabetic: No     Tobacco smoker: No  Systolic Blood Pressure: 865 mmHg     Is BP treated: Yes     HDL Cholesterol: 70 mg/dL     Total Cholesterol: 174 mg/dL    Physical Exam:    VS:  BP 138/82   Pulse 77   Ht '5\' 6"'$  (1.676 m)   Wt 175 lb (79.4 kg)   SpO2 99%   BMI 28.25 kg/m     Wt Readings from Last 3 Encounters:  12/05/21 175 lb (79.4 kg)  11/03/21 177 lb (80.3 kg)  09/07/21 177 lb (80.3 kg)    GEN:  Well nourished, well developed in no acute distress HEENT: Normal NECK: No JVD; No carotid bruits LYMPHATICS: No lymphadenopathy CARDIAC: RRR, no murmurs, rubs, gallops RESPIRATORY:  Clear to auscultation without rales, wheezing or rhonchi  ABDOMEN: Soft, non-tender, non-distended MUSCULOSKELETAL:  No edema; No deformity  SKIN: Warm and dry NEUROLOGIC:  Alert and oriented x 3 PSYCHIATRIC:  Normal affect   ASSESSMENT:    1. Palpitations   2. Hyperlipidemia, unspecified hyperlipidemia type   3. History of TIA (transient ischemic attack)   4. Essential hypertension    PLAN:    Palpitations HTN - sx may be related to her chronic pain, or to HTN or electrical issues - continue norvasc - amb BP both at rest and with symptoms - low threshold to add labetalol - one week non live ziopatch  HLD Statin myalgia Hx of TIA - LDL goal < 70 - cut back to atorvastatin 10 mg; will check labs today - may need zetia 10   PRN unless BP is difficult to control OR arrhythmia      Medication Adjustments/Labs and Tests Ordered: Current medicines are reviewed at length with the patient today.  Concerns regarding  medicines are outlined above.  Orders Placed This Encounter  Procedures   ALT   Lipid panel   LONG TERM MONITOR (3-14 DAYS)   No orders of the defined types were placed in this encounter.   Patient Instructions  Medication Instructions:  Your physician recommends that you continue on your current medications as directed. Please refer to the Current Medication list given to you today.  *If you need a refill on your cardiac medications before your next appointment, please call your pharmacy*   Lab Work: TODAY: FLP, ALT  If you have labs (blood work) drawn today and your tests are completely normal, you will receive your results only by: [place] (if you have MyChart) OR A paper copy in the mail If you have any lab test that is abnormal or we need to change your treatment, we will call you to review the results.   Testing/Procedures: Your physician has requested that you wear a heart monitor   Follow-Up: As needed  At [facility], you and your health needs are our priority.  As part of our continuing mission to provide you with exceptional heart care, we have created designated Provider Care Teams.  These Care Teams include your primary Cardiologist (physician) and Advanced Practice Providers (APPs -  Physician Assistants and Nurse Practitioners) who all work together to provide you with the care you need, when you need it.   Provider:   Rudean Haskell, MD  OTHER INSTRUCTIONS ZIO XT- Long Term Monitor Instructions  Your physician has requested you wear a ZIO patch monitor for 7 days.  This is a single patch monitor. [employer] supplies one patch monitor per enrollment. Additional stickers are not available. Please do not apply patch  if you will be having a Nuclear Stress Test,  Echocardiogram, Cardiac CT, MRI, or Chest Xray during the period you would be wearing the  monitor. The patch cannot be worn during these tests. You cannot remove and re-apply the   ZIO XT patch monitor.  Your ZIO patch monitor will be mailed 3 day USPS to your address on file. It may take 3-5 days  to receive your monitor after you have been enrolled.  Once you have received your monitor, please review the enclosed instructions. Your monitor  has already been registered assigning a specific monitor serial # to you.  Billing and Patient Assistance Program Information  We have supplied [employer] with any of your insurance information on file for billing purposes. [employer] offers a sliding scale Patient Assistance Program for patients that do not have  insurance, or whose insurance does not completely cover the cost of the ZIO monitor.  You must apply for the Patient Assistance Program to qualify for this discounted rate.  To apply, please call [employer] at [phone], select option 4, select option 2, ask to apply for  Patient Assistance Program. Theodore Demark will ask your household income, and how many people  are in your household. They will quote your out-of-pocket cost based on that information.  [employer] will also be able to set up a 57-month interest-free payment plan if needed.  Applying the monitor   Shave hair from upper left chest.  Hold abrader disc by orange tab. Rub abrader in 40 strokes over the upper left chest as  indicated in your monitor instructions.  Clean area with 4 enclosed alcohol pads. Let dry.  Apply patch as indicated in monitor instructions. Patch will be placed under collarbone on left  side of chest with arrow pointing upward.  Rub patch adhesive wings for 2 minutes. Remove white label marked "1". Remove the white  label marked "2". Rub patch adhesive wings for 2 additional minutes.  While looking in a mirror, press and release button in center of patch. A small green light will  flash 3-4 times. This will be your only indicator that the monitor has been turned on.  Do not shower for the first 24 hours. You may shower after the first 24 hours.   Press the button if you feel a symptom. You will hear a small click. Record Date, Time and  Symptom in the Patient Logbook.  When you are ready to remove the patch, follow instructions on the last 2 pages of Patient  Logbook. Stick patch monitor onto the last page of Patient Logbook.  Place Patient Logbook in the blue and white box. Use locking tab on box and tape box closed  securely. The blue and white box has prepaid postage on it. Please place it in the mailbox as  soon as possible. Your physician should have your test results approximately 7 days after the  monitor [facility]  Call [place] [phone] you have questions regarding  your ZIO XT patch monitor. Call them immediately if you see an orange light blinking on your  monitor.  If your monitor falls off in less than 4 days, contact our Monitor department at [phone]  If your monitor becomes loose or falls off after 4 days call [employer] at [phone]  suggestions on securing your monitor    Important Information About Sugar      bloo   Signed, MWerner Lean MD  12/05/2021 11:59  AM    [place]

## 2021-12-05 ENCOUNTER — Ambulatory Visit (INDEPENDENT_AMBULATORY_CARE_PROVIDER_SITE_OTHER): Payer: Managed Care, Other (non HMO)

## 2021-12-05 ENCOUNTER — Encounter: Payer: Self-pay | Admitting: Internal Medicine

## 2021-12-05 ENCOUNTER — Ambulatory Visit: Payer: Managed Care, Other (non HMO) | Attending: Internal Medicine | Admitting: Internal Medicine

## 2021-12-05 VITALS — BP 138/82 | HR 77 | Ht 66.0 in | Wt 175.0 lb

## 2021-12-05 DIAGNOSIS — E785 Hyperlipidemia, unspecified: Secondary | ICD-10-CM | POA: Diagnosis not present

## 2021-12-05 DIAGNOSIS — R002 Palpitations: Secondary | ICD-10-CM

## 2021-12-05 DIAGNOSIS — I10 Essential (primary) hypertension: Secondary | ICD-10-CM

## 2021-12-05 DIAGNOSIS — Z8673 Personal history of transient ischemic attack (TIA), and cerebral infarction without residual deficits: Secondary | ICD-10-CM

## 2021-12-05 LAB — LIPID PANEL
Chol/HDL Ratio: 2.5 ratio (ref 0.0–4.4)
Cholesterol, Total: 202 mg/dL — ABNORMAL HIGH (ref 100–199)
HDL: 80 mg/dL (ref >39)
LDL Chol Calc (NIH): 112 mg/dL — ABNORMAL HIGH (ref 0–99)
Triglycerides: 56 mg/dL (ref 0–149)
VLDL Cholesterol Cal: 10 mg/dL (ref 5–40)

## 2021-12-05 LAB — ALT: ALT: 14 IU/L (ref 0–32)

## 2021-12-05 NOTE — Progress Notes (Unsigned)
Enrolled for [employer] to mail a ZIO XT long term holter monitor to the patients address on file.  

## 2021-12-05 NOTE — Patient Instructions (Addendum)
Medication Instructions:  Your physician recommends that you continue on your current medications as directed. Please refer to the Current Medication list given to you today.  *If you need a refill on your cardiac medications before your next appointment, please call your pharmacy*   Lab Work: TODAY: FLP, ALT  If you have labs (blood work) drawn today and your tests are completely normal, you will receive your results only by: [place] (if you have MyChart) OR A paper copy in the mail If you have any lab test that is abnormal or we need to change your treatment, we will call you to review the results.   Testing/Procedures: Your physician has requested that you wear a heart monitor   Follow-Up: As needed  At [facility], you and your health needs are our priority.  As part of our continuing mission to provide you with exceptional heart care, we have created designated Provider Care Teams.  These Care Teams include your primary Cardiologist (physician) and Advanced Practice Providers (APPs -  Physician Assistants and Nurse Practitioners) who all work together to provide you with the care you need, when you need it.   Provider:   Rudean Haskell, MD  OTHER INSTRUCTIONS ZIO XT- Long Term Monitor Instructions  Your physician has requested you wear a ZIO patch monitor for 7 days.  This is a single patch monitor. [employer] supplies one patch monitor per enrollment. Additional stickers are not available. Please do not apply patch if you will be having a Nuclear Stress Test,  Echocardiogram, Cardiac CT, MRI, or Chest Xray during the period you would be wearing the  monitor. The patch cannot be worn during these tests. You cannot remove and re-apply the  ZIO XT patch monitor.  Your ZIO patch monitor will be mailed 3 day USPS to your address on file. It may take 3-5 days  to receive your monitor after you have been enrolled.  Once you have received your monitor, please  review the enclosed instructions. Your monitor  has already been registered assigning a specific monitor serial # to you.  Billing and Patient Assistance Program Information  We have supplied [employer] with any of your insurance information on file for billing purposes. [employer] offers a sliding scale Patient Assistance Program for patients that do not have  insurance, or whose insurance does not completely cover the cost of the ZIO monitor.  You must apply for the Patient Assistance Program to qualify for this discounted rate.  To apply, please call [employer] at [phone], select option 4, select option 2, ask to apply for  Patient Assistance Program. Theodore Demark will ask your household income, and how many people  are in your household. They will quote your out-of-pocket cost based on that information.  [employer] will also be able to set up a 77-month interest-free payment plan if needed.  Applying the monitor   Shave hair from upper left chest.  Hold abrader disc by orange tab. Rub abrader in 40 strokes over the upper left chest as  indicated in your monitor instructions.  Clean area with 4 enclosed alcohol pads. Let dry.  Apply patch as indicated in monitor instructions. Patch will be placed under collarbone on left  side of chest with arrow pointing upward.  Rub patch adhesive wings for 2 minutes. Remove white label marked "1". Remove the white  label marked "2". Rub patch adhesive wings for 2 additional minutes.  While looking in a mirror, press and release button in center of  patch. A small green light will  flash 3-4 times. This will be your only indicator that the monitor has been turned on.  Do not shower for the first 24 hours. You may shower after the first 24 hours.  Press the button if you feel a symptom. You will hear a small click. Record Date, Time and  Symptom in the Patient Logbook.  When you are ready to remove the patch, follow instructions on the last 2 pages of Patient   Logbook. Stick patch monitor onto the last page of Patient Logbook.  Place Patient Logbook in the blue and white box. Use locking tab on box and tape box closed  securely. The blue and white box has prepaid postage on it. Please place it in the mailbox as  soon as possible. Your physician should have your test results approximately 7 days after the  monitor has been mailed back to [facility].  Call [place] at [phone] if you have questions regarding  your ZIO XT patch monitor. Call them immediately if you see an orange light blinking on your  monitor.  If your monitor falls off in less than 4 days, contact our Monitor department at [phone].  If your monitor becomes loose or falls off after 4 days call [employer] at [phone] for  suggestions on securing your monitor    Important Information About Sugar      bloo

## 2021-12-09 DIAGNOSIS — R002 Palpitations: Secondary | ICD-10-CM

## 2021-12-10 ENCOUNTER — Telehealth: Payer: Self-pay | Admitting: Internal Medicine

## 2021-12-10 DIAGNOSIS — E785 Hyperlipidemia, unspecified: Secondary | ICD-10-CM

## 2021-12-10 DIAGNOSIS — Z8673 Personal history of transient ischemic attack (TIA), and cerebral infarction without residual deficits: Secondary | ICD-10-CM

## 2021-12-10 MED ORDER — EZETIMIBE 10 MG PO TABS: 10.0000 mg | ORAL_TABLET | Freq: Every day | ORAL | 3 refills | Status: DC

## 2021-12-10 NOTE — Telephone Encounter (Signed)
-----   Message from Werner Lean, MD sent at 12/08/2021  1:19 PM EDT ----- LDL above goal for hx of prior TIA Zetia 10 mg and labs in thre emonths

## 2021-12-10 NOTE — Telephone Encounter (Signed)
The patient has been notified of the result and verbalized understanding.  All questions (if any) were answered. Precious Gilding, RN 12/10/2021 5:06 PM   FLP scheduled for 03/04/22.

## 2021-12-10 NOTE — Telephone Encounter (Signed)
Patient returned RN's call. 

## 2021-12-18 NOTE — Telephone Encounter (Signed)
I see where you addressed neuropathy at annual exam on 06/26/21. Would you like to fill out handicap application based on that, or have patient come in for visit?

## 2021-12-31 ENCOUNTER — Ambulatory Visit: Payer: Managed Care, Other (non HMO) | Admitting: Primary Care

## 2022-01-07 ENCOUNTER — Emergency Department: Payer: Managed Care, Other (non HMO)

## 2022-01-07 ENCOUNTER — Emergency Department
Admission: EM | Admit: 2022-01-07 | Discharge: 2022-01-07 | Disposition: A | Discharge status: Home / Self Care | Payer: Managed Care, Other (non HMO) | Attending: Emergency Medicine | Admitting: Emergency Medicine

## 2022-01-07 ENCOUNTER — Other Ambulatory Visit: Payer: Self-pay

## 2022-01-07 ENCOUNTER — Encounter: Payer: Self-pay | Admitting: Emergency Medicine

## 2022-01-07 DIAGNOSIS — R002 Palpitations: Secondary | ICD-10-CM | POA: Insufficient documentation

## 2022-01-07 DIAGNOSIS — K219 Gastro-esophageal reflux disease without esophagitis: Secondary | ICD-10-CM | POA: Diagnosis not present

## 2022-01-07 DIAGNOSIS — R109 Unspecified abdominal pain: Secondary | ICD-10-CM | POA: Diagnosis present

## 2022-01-07 LAB — LIPASE, BLOOD: Lipase: 36 U/L (ref 11–51)

## 2022-01-07 LAB — CBC
HCT: 41.7 % (ref 36.0–46.0)
Hemoglobin: 13.5 g/dL (ref 12.0–15.0)
MCH: 27.4 pg (ref 26.0–34.0)
MCHC: 32.4 g/dL (ref 30.0–36.0)
MCV: 84.6 fL (ref 80.0–100.0)
Platelets: 262 10*3/uL (ref 150–400)
RBC: 4.93 MIL/uL (ref 3.87–5.11)
RDW: 14.6 % (ref 11.5–15.5)
WBC: 4.2 10*3/uL (ref 4.0–10.5)
nRBC: 0 % (ref 0.0–0.2)

## 2022-01-07 LAB — URINALYSIS, ROUTINE W REFLEX MICROSCOPIC
Bilirubin Urine: NEGATIVE
Glucose, UA: NEGATIVE mg/dL
Hgb urine dipstick: NEGATIVE
Ketones, ur: NEGATIVE mg/dL
Leukocytes,Ua: NEGATIVE
Nitrite: NEGATIVE
Protein, ur: NEGATIVE mg/dL
Specific Gravity, Urine: 1.009 (ref 1.005–1.030)
pH: 5 (ref 5.0–8.0)

## 2022-01-07 LAB — COMPREHENSIVE METABOLIC PANEL
ALT: 15 U/L (ref 0–44)
AST: 25 U/L (ref 15–41)
Albumin: 4.3 g/dL (ref 3.5–5.0)
Alkaline Phosphatase: 86 U/L (ref 38–126)
Anion gap: 6 (ref 5–15)
BUN: 12 mg/dL (ref 6–20)
CO2: 27 mmol/L (ref 22–32)
Calcium: 10.4 mg/dL — ABNORMAL HIGH (ref 8.9–10.3)
Chloride: 109 mmol/L (ref 98–111)
Creatinine, Ser: 0.82 mg/dL (ref 0.44–1.00)
GFR, Estimated: >60 mL/min (ref >60)
Glucose, Bld: 115 mg/dL — ABNORMAL HIGH (ref 70–99)
Potassium: 4.3 mmol/L (ref 3.5–5.1)
Sodium: 142 mmol/L (ref 135–145)
Total Bilirubin: 0.6 mg/dL (ref 0.3–1.2)
Total Protein: 7.6 g/dL (ref 6.5–8.1)

## 2022-01-07 LAB — TROPONIN I (HIGH SENSITIVITY)
Troponin I (High Sensitivity): 3 ng/L (ref <18)
Troponin I (High Sensitivity): 3 ng/L (ref <18)

## 2022-01-07 LAB — D-DIMER, QUANTITATIVE: D-Dimer, Quant: 0.43 ug/mL-FEU (ref 0.00–0.50)

## 2022-01-07 MED ORDER — LIDOCAINE VISCOUS HCL 2 % MT SOLN
15.0000 mL | Freq: Once | OROMUCOSAL | Status: AC
  Administered 2022-01-07: 15 mL via ORAL
  Filled 2022-01-07: qty 15

## 2022-01-07 MED ORDER — ALUM & MAG HYDROXIDE-SIMETH 200-200-20 MG/5ML PO SUSP
30.0000 mL | Freq: Once | ORAL | Status: AC
  Administered 2022-01-07: 30 mL via ORAL
  Filled 2022-01-07: qty 30

## 2022-01-07 MED ORDER — PANTOPRAZOLE SODIUM 40 MG PO TBEC: 40.0000 mg | DELAYED_RELEASE_TABLET | Freq: Two times a day (BID) | ORAL | 1 refills | Status: DC

## 2022-01-07 NOTE — ED Provider Notes (Signed)
[facility] Provider Note    Event Date/Time   First MD Initiated Contact with Patient 01/07/22 1654     (approximate)   History   Breast Pain, Chest Pain, Abdominal Pain, and Palpitations   HPI  Ashley [name] is a 56 y.o. female who complains of several days of very bad reflux in spite of taking her acid blocking medicine.  She has developed some pain under her left breast laterally.  This comes and goes.  Sometimes it seems to come on if she moves a certain way.  When it is there it will be worse with deep breathing.  She has a little bit short of breath.  She also reports crampy gas type pains in her tummy.  These last 2 symptoms of chest pain and crampiness in the stomach have: Come on in the last day or 2 after the reflux      Physical Exam   Triage Vital Signs: ED Triage Vitals  Enc Vitals Group     BP 01/07/22 1455 (!) 171/103     Pulse Rate 01/07/22 1455 97     Resp 01/07/22 1455 16     Temp 01/07/22 1459 98.9 F (37.2 C)     Temp Source 01/07/22 1455 Oral     SpO2 01/07/22 1455 98 %     Weight 01/07/22 1453 172 lb (78 kg)     Height 01/07/22 1453 '5\' 6"'$  (1.676 m)     Head Circumference --      Peak Flow --      Pain Score 01/07/22 1453 6     Pain Loc --      Pain Edu? --      Excl. in [place]? --     Most recent vital signs: Vitals:   01/07/22 1459 01/07/22 1800  BP:  132/87  Pulse:  82  Resp:  16  Temp: 98.9 F (37.2 C) 98.9 F (37.2 C)  SpO2:  98%     General: Awake, no distress.  CV:  Good peripheral perfusion.  Heart regular rate and rhythm no audible murmurs Resp:  Normal effort.  Lungs are clear Chest: Nontender to palpation Abd:  No distention.  Not tender Extremities with no edema   ED Results / Procedures / Treatments   Labs (all labs ordered are listed, but only abnormal results are displayed) Labs Reviewed  COMPREHENSIVE METABOLIC PANEL - Abnormal; Notable for the following components:      Result  Value   Glucose, Bld 115 (*)    Calcium 10.4 (*)    All other components within normal limits  URINALYSIS, ROUTINE W REFLEX MICROSCOPIC - Abnormal; Notable for the following components:   Color, Urine YELLOW (*)    APPearance HAZY (*)    All other components within normal limits  LIPASE, BLOOD  CBC  D-DIMER, QUANTITATIVE  TROPONIN I (HIGH SENSITIVITY)  TROPONIN I (HIGH SENSITIVITY)     EKG  EKG read and interpreted by me shows normal sinus rhythm rate of 84 normal axis no acute ST-T wave changes there is diffuse low amplitude in the T waves.   RADIOLOGY Chest x-ray read by radiology reviewed and interpreted by me as negative for acute changes   PROCEDURES:  Critical Care performed:   Procedures   MEDICATIONS ORDERED IN ED: Medications  alum & mag hydroxide-simeth (MAALOX/MYLANTA) 200-200-20 MG/5ML suspension 30 mL (30 mLs Oral Given 01/07/22 1723)    And  lidocaine (XYLOCAINE) 2 % viscous  mouth solution 15 mL (15 mLs Oral Given 01/07/22 1723)  alum & mag hydroxide-simeth (MAALOX/MYLANTA) 200-200-20 MG/5ML suspension 30 mL (30 mLs Oral Given 01/07/22 1814)     IMPRESSION / MDM / ASSESSMENT AND PLAN / ED COURSE  I reviewed the triage vital signs and the nursing notes. Patient's pain gets much better with GI cocktail.  Almost resolved the second 1.  Patient's troponin was negative.  Patient's D-dimer was negative.  Labs are stable.  Let the patient go follow-up with GI. Differential diagnosis includes, but is not limited to, reflux, esophageal spasm, MI, angina, pneumonia, PE, most of these have been ruled out by lab work.  Patient's presentation is most consistent with acute complicated illness / injury requiring diagnostic workup.  he patient is on the cardiac monitor to evaluate for evidence of arrhythmia and/or significant heart rate changes.  None were seen I considered admitting this patient but it developed I did not have to.   FINAL CLINICAL IMPRESSION(S) / ED  DIAGNOSES   Final diagnoses:  Gastroesophageal reflux disease, unspecified whether esophagitis present     Rx / DC Orders   ED Discharge Orders          Ordered    pantoprazole (PROTONIX) 40 MG tablet  2 times daily        01/07/22 1757             Note:  This document was prepared using Dragon voice recognition software and may include unintentional dictation errors.   Nena Polio, MD 01/07/22 [phone]

## 2022-01-07 NOTE — Discharge Instructions (Addendum)
Please follow-up with Dr. Vicente Males the gastroenterologist.  Also follow-up with your regular doctor.  For the time being take Protonix 40 mg 1 twice a day.  (Save the 20 mg pills of Protonix.  You will probably go back to them later.)  Please return here if you have any worsening pain or fever or any other problems.

## 2022-01-07 NOTE — ED Provider Triage Note (Signed)
Emergency Medicine Provider Triage Evaluation Note  Demoni Parmar , a 56 y.o. female  was evaluated in triage.  Pt complains of chest pain and abdominal pain.  Review of Systems  Positive:  Negative:   Physical Exam  BP (!) 171/103 (BP Location: Left Arm)   Pulse 97   Resp 16   Ht '5\' 6"'$  (1.676 m)   Wt 78 kg   SpO2 98%   BMI 27.76 kg/m  Gen:   Awake, no distress   Resp:  Normal effort  MSK:   Moves extremities without difficulty  Other:    Medical Decision Making  Medically screening exam initiated at 2:59 PM.  Appropriate orders placed.  Adeliz Tonkinson was informed that the remainder of the evaluation will be completed by another provider, this initial triage assessment does not replace that evaluation, and the importance of remaining in the ED until their evaluation is complete.     Versie Starks, PA-C 01/07/22 1459

## 2022-01-07 NOTE — ED Triage Notes (Signed)
Pt to ED via POV, pt states that she has been having left breast pain, central chest pain, abdominal pain, and palpitations x 1 week. Pt states that she is currently having pain in all areas. Pt is in NAD.

## 2022-01-09 ENCOUNTER — Ambulatory Visit: Payer: Managed Care, Other (non HMO) | Admitting: Primary Care

## 2022-01-21 ENCOUNTER — Other Ambulatory Visit: Payer: Self-pay | Admitting: Primary Care

## 2022-01-21 DIAGNOSIS — Z1231 Encounter for screening mammogram for malignant neoplasm of breast: Secondary | ICD-10-CM

## 2022-01-30 ENCOUNTER — Ambulatory Visit
Admission: RE | Admit: 2022-01-30 | Discharge: 2022-01-30 | Disposition: A | Discharge status: Home / Self Care | Payer: Managed Care, Other (non HMO) | Source: Ambulatory Visit | Attending: Primary Care

## 2022-01-30 ENCOUNTER — Encounter: Payer: Self-pay | Admitting: Primary Care

## 2022-01-30 ENCOUNTER — Encounter: Payer: Self-pay | Admitting: *Deleted

## 2022-01-30 ENCOUNTER — Ambulatory Visit: Payer: Managed Care, Other (non HMO) | Admitting: Primary Care

## 2022-01-30 VITALS — BP 136/72 | HR 111 | Temp 97.0°F | Ht 66.0 in | Wt 175.0 lb

## 2022-01-30 DIAGNOSIS — M542 Cervicalgia: Secondary | ICD-10-CM

## 2022-01-30 DIAGNOSIS — N644 Mastodynia: Secondary | ICD-10-CM | POA: Insufficient documentation

## 2022-01-30 DIAGNOSIS — R82998 Other abnormal findings in urine: Secondary | ICD-10-CM | POA: Diagnosis not present

## 2022-01-30 DIAGNOSIS — G8929 Other chronic pain: Secondary | ICD-10-CM

## 2022-01-30 DIAGNOSIS — M25561 Pain in right knee: Secondary | ICD-10-CM

## 2022-01-30 DIAGNOSIS — K219 Gastro-esophageal reflux disease without esophagitis: Secondary | ICD-10-CM | POA: Diagnosis not present

## 2022-01-30 DIAGNOSIS — M48061 Spinal stenosis, lumbar region without neurogenic claudication: Secondary | ICD-10-CM

## 2022-01-30 HISTORY — DX: Mastodynia: N64.4

## 2022-01-30 LAB — POC URINALSYSI DIPSTICK (AUTOMATED)
Bilirubin, UA: NEGATIVE
Blood, UA: NEGATIVE
Glucose, UA: NEGATIVE
Ketones, UA: NEGATIVE
Leukocytes, UA: NEGATIVE
Nitrite, UA: NEGATIVE
Protein, UA: NEGATIVE
Spec Grav, UA: 1.02 (ref 1.010–1.025)
Urobilinogen, UA: 0.2 E.U./dL
pH, UA: 5 (ref 5.0–8.0)

## 2022-01-30 MED ORDER — CYCLOBENZAPRINE HCL 5 MG PO TABS: 5.0000 mg | ORAL_TABLET | Freq: Three times a day (TID) | ORAL | 0 refills | Status: DC | PRN

## 2022-01-30 NOTE — Progress Notes (Signed)
Subjective:    Patient ID: Ashley [name], female    DOB: 07-Jun-1965, 56 y.o.   MRN: [id number]  Heartburn She complains of heartburn. She reports no chest pain.    Ashley [name] is a very pleasant 56 y.o. female with history of hypertension, GERD, osteoarthritis paresthesias, TIA, chronic hand pain, prediabetes, impingement syndrome of bilateral shoulders, cervical radiculitis who presents today to discuss multiple concerns.  1) GERD: Chronic history. Evaluated at [facility] ED on 01/07/2022 for a several day history of esophageal reflux, gas, abdominal cramping despite her antacid treatment at home.  During her stay in the ED she underwent lab work which revealed negative troponin, negative D-dimer.  She was treated with a GI cocktail and noted immediate resolve in symptoms.  She was prescribed pantoprazole 40 mg twice daily and told to follow-up with GI.   Since her hospital visit she's noticed slight improvement her symptoms. She continues to experience symptoms of gas, esophageal fullness, chest heaviness. Her GERD symptoms are constant, but worse over the last few months. Follows with GI, plans on calling for an appointment.   2) Breast Pain: Evaluated at [facility] ED on 01/07/2022 for GERD symptoms and left lateral intermittent breast pain, occasionally occurs with certain movement.  She underwent lab work including troponin and D-dimer, both of which were negative. ECG was without acute abnormality. Her pain was presumed to be associated with her active GERD so she was treated with pantoprazole 40 mg twice daily and discharged home.   She continues to experience left lateral breast pain, pain to the left axilla, and pain under her breast that feels like a "pull". She denies breast lumps, skin texture changes, rashes.   She continues to experience chronic nerve pain to her abdomen, lower right back, rectum. She questions if her symptoms are secondary to nerve irritation.   She  recently underwent cancer detection labs through her employer, all of which were negative. She has a family history of ovarian cancer in her mother, lung cancer in her maternal grandmother, and breast caner in her maternal aunt.   3) Chronic Knee Pain: Chronic to the right knee for years. Worse when rising from a squatted position and rising from a seated position. Improved when standing. She denies loss in ROM, swelling, acute trauma. She is active in physical therapy for her chronic back pain.   4) Dark Colored Urine: Acute for the last 2 weeks, also with "pungent smell". She denies urinary urgency, dysuria, hematuria, difficulty urinating, vaginal symptoms. She doesn't drink much water. She underwent CMP from mid November with normal renal function.    Review of Systems  Respiratory:  Negative for shortness of breath.   Cardiovascular:  Negative for chest pain.  Gastrointestinal:  Positive for heartburn.       GERD symptoms, gas/bloating  Genitourinary:  Negative for dysuria, frequency, hematuria, urgency and vaginal discharge.       Dark colored urine  Musculoskeletal:  Positive for arthralgias, back pain and myalgias. Negative for joint swelling.         Past Medical History:  Diagnosis Date   Abdominal pain 06/23/2018   Acute midline low back pain without sciatica 09/10/2019   Adverse effect of drug 08/15/2019   Anemia    Bronchitis    2 years ago    [facility] arthritis 2019   base of left thumb    Constipation 04/07/2019   Discoloration of skin 05/17/2021   GERD (gastroesophageal reflux disease)  diet controlled , no meds   Hydrosalpinx    bilateral    Hyperlipidemia    recent dx - currently diet control - no meds   Hypertension    Hypothyroid 2003   radio active iodine - no meds   Left-sided weakness 02/12/2019   Ovarian cyst, left 02/23/2016   S/P shoulder surgery 02/28/2015   SVD (spontaneous vaginal delivery)    x 1   Urinary frequency 11/30/2018   Vitamin B 12  deficiency     Social History   Socioeconomic History   Marital status: Single    Spouse name: Not on file   Number of children: Not on file   Years of education: Not on file   Highest education level: Not on file  Occupational History   Not on file  Tobacco Use   Smoking status: Never   Smokeless tobacco: Never  Vaping Use   Vaping Use: Never used  Substance and Sexual Activity   Alcohol use: No   Drug use: No   Sexual activity: Yes    Partners: Male    Birth control/protection: Surgical    Comment: hysterectomy.  Other Topics Concern   Not on file  Social History Narrative   Single.   3 children.   Works at [employer] as a Designer, multimedia.   Enjoys relaxing, exercise, walking her dog.    Social Determinants of Health   Financial Resource Strain: Not on file  Food Insecurity: Not on file  Transportation Needs: Not on file  Physical Activity: Not on file  Stress: Not on file  Social Connections: Not on file  Intimate Partner Violence: Not on file    Past Surgical History:  Procedure Laterality Date   CESAREAN SECTION     x 2   CHOLECYSTECTOMY  2008   COLONOSCOPY WITH PROPOFOL N/A 08/06/2021   Procedure: COLONOSCOPY WITH PROPOFOL;  Surgeon: [name], MD;  Location: [place];  Service: Endoscopy;  Laterality: N/A;   DILATION AND CURETTAGE OF UTERUS     X 2   ESOPHAGOGASTRODUODENOSCOPY (EGD) WITH PROPOFOL N/A 08/06/2021   Procedure: ESOPHAGOGASTRODUODENOSCOPY (EGD) WITH PROPOFOL;  Surgeon: [name], MD;  Location: [place];  Service: Endoscopy;  Laterality: N/A;   LAPAROSCOPIC BILATERAL SALPINGECTOMY Bilateral 07/29/2017   Procedure: LAPAROSCOPIC LEFT SALPINGOOPHRECTOMY: Partial right Salpingectomy;  Surgeon: Lavonia Drafts, MD;  Location: [place];  Service: Gynecology;  Laterality: Bilateral;   POLYPECTOMY  08/06/2021   Procedure: POLYPECTOMY;  Surgeon: [name], MD;  Location: [place];  Service: Endoscopy;;    SHOULDER SURGERY  2016   arthroscopic   TOTAL ABDOMINAL HYSTERECTOMY  2006   Fibroids, benign pathology; partial hysterectomy per patient    TUBAL LIGATION     WISDOM TOOTH EXTRACTION     at age 20 yr   WRIST ARTHROSCOPY Right 2007    Family History  Problem Relation Age of Onset   Hypertension Mother    Ovarian cancer Mother    Other Father        unknown medical history   Hypertension Maternal Grandmother    Heart failure Maternal Grandmother    Lung cancer Maternal Grandmother    Breast cancer Maternal Aunt    Colon cancer Neg Hx    Colon polyps Neg Hx    Rectal cancer Neg Hx    Stomach cancer Neg Hx     Allergies  Allergen Reactions   Aczone [Dapsone] Swelling   Levaquin [Levofloxacin]  Presumed cause of joint aches    Current Outpatient Medications on File Prior to Visit  Medication Sig Dispense Refill   amLODipine (NORVASC) 5 MG tablet Take 1 tablet (5 mg total) by mouth daily. for blood pressure. 90 tablet 3   aspirin 81 MG EC tablet Take by mouth.     atorvastatin (LIPITOR) 10 MG tablet Take 1 tablet (10 mg total) by mouth daily. for cholesterol. 90 tablet 3   azelastine (ASTELIN) 0.1 % nasal spray Place into the nose.     ezetimibe (ZETIA) 10 MG tablet Take 1 tablet (10 mg total) by mouth daily. 90 tablet 3   fluticasone (FLONASE) 50 MCG/ACT nasal spray Place 1 spray into both nostrils 2 (two) times daily. 16 g 2   gabapentin (NEURONTIN) 600 MG tablet Take 1,200 mg by mouth daily.     pantoprazole (PROTONIX) 40 MG tablet Take 1 tablet (40 mg total) by mouth 2 (two) times daily. 60 tablet 1   cetirizine (ZYRTEC) 10 MG tablet as needed for allergies. (Patient not taking: Reported on 01/30/2022)     No current facility-administered medications on file prior to visit.    BP 136/72   Pulse (!) 111   Temp (!) 97 F (36.1 C) (Temporal)   Ht '5\' 6"'$  (1.676 m)   Wt 175 lb (79.4 kg)   SpO2 98%   BMI 28.25 kg/m  Objective:   Physical Exam Constitutional:       General: She is not in acute distress. Cardiovascular:     Rate and Rhythm: Normal rate and regular rhythm.  Pulmonary:     Effort: Pulmonary effort is normal.     Breath sounds: Normal breath sounds.  Chest:  Breasts:    Right: No swelling, mass, skin change or tenderness.     Left: No swelling, mass, skin change or tenderness.  Musculoskeletal:     Right knee: No bony tenderness. Normal range of motion. No tenderness.     Left knee: No bony tenderness. Normal range of motion. No tenderness.  Lymphadenopathy:     Upper Body:     Right upper body: No axillary adenopathy.     Left upper body: No axillary adenopathy.           Assessment & Plan:   Problem List Items Addressed This Visit       Digestive   GERD (gastroesophageal reflux disease) - Primary    Uncontrolled.  Continue pantoprazole 40 mg BID. Discussed that she could add in famotidine 20 mg daily if needed.  Reviewed endoscopy from June 2023.  She will call GI for follow up.        Other   Lumbar stenosis without neurogenic claudication    Working with physical therapy. Continue physical therapy and consider dry needling.       Acute breast pain    Could be secondary to nerve irritation or GERD, more likely nerves. Given her family history we will proceed with diagnostic mammogram with left breast US. She agrees.  Orders placed.      Relevant Orders   MM DIAG BREAST TOMO BILATERAL   US BREAST LTD UNI LEFT INC AXILLA   Chronic pain of right knee    Xrays pending today.  She will discuss with her physical therapist.  Consider ortho referral if warranted. Await results.       Relevant Orders   DG Knee 3 Views Right   Dark urine    UA today negative.  Discussed to increase water intake.       Relevant Orders   POCT Urinalysis Dipstick (Automated) (Completed)       Pleas Koch, NP

## 2022-01-30 NOTE — Assessment & Plan Note (Addendum)
UA today negative.  Discussed to increase water intake.

## 2022-01-30 NOTE — Assessment & Plan Note (Signed)
Uncontrolled.  Continue pantoprazole 40 mg BID. Discussed that she could add in famotidine 20 mg daily if needed.  Reviewed endoscopy from June 2023.  She will call GI for follow up.

## 2022-01-30 NOTE — Patient Instructions (Signed)
Complete xray(s) prior to leaving today. I will notify you of your results once received.  You will either be contacted via phone regarding your mammogram and ultrasound, or you may receive a letter on your MyChart portal from our referral team with instructions for scheduling an appointment. Please let us know if you have not been contacted by anyone within two weeks.  Ensure you are consuming 64 ounces of water daily.  It was a pleasure to see you today!

## 2022-01-30 NOTE — Assessment & Plan Note (Signed)
Could be secondary to nerve irritation or GERD, more likely nerves. Given her family history we will proceed with diagnostic mammogram with left breast US. She agrees.  Orders placed.

## 2022-01-30 NOTE — Assessment & Plan Note (Signed)
Working with physical therapy. Continue physical therapy and consider dry needling.

## 2022-01-30 NOTE — Assessment & Plan Note (Signed)
Xrays pending today.  She will discuss with her physical therapist.  Consider ortho referral if warranted. Await results.

## 2022-01-31 DIAGNOSIS — A609 Anogenital herpesviral infection, unspecified: Secondary | ICD-10-CM

## 2022-02-01 MED ORDER — VALACYCLOVIR HCL 1 G PO TABS: 1000.0000 mg | ORAL_TABLET | Freq: Every day | ORAL | 0 refills | Status: AC

## 2022-02-01 NOTE — Telephone Encounter (Signed)
Called and spoke with patient, she states she is experiencing another herpes outbreak. Symptoms have been present for 1 day. Offered an appointment to be seen, patient declined. Patient states in the past Anda Kraft has given her medication for this since it is a regular thing she experiences. She is requesting a prescription to be sent to the pharmacy.

## 2022-03-04 ENCOUNTER — Ambulatory Visit: Payer: Managed Care, Other (non HMO) | Attending: Internal Medicine

## 2022-03-04 DIAGNOSIS — E785 Hyperlipidemia, unspecified: Secondary | ICD-10-CM

## 2022-03-04 LAB — LIPID PANEL
Chol/HDL Ratio: 2.5 ratio (ref 0.0–4.4)
Cholesterol, Total: 157 mg/dL (ref 100–199)
HDL: 64 mg/dL (ref >39)
LDL Chol Calc (NIH): 81 mg/dL (ref 0–99)
Triglycerides: 58 mg/dL (ref 0–149)
VLDL Cholesterol Cal: 12 mg/dL (ref 5–40)

## 2022-03-05 ENCOUNTER — Other Ambulatory Visit: Payer: Managed Care, Other (non HMO)

## 2022-03-18 ENCOUNTER — Ambulatory Visit
Admission: RE | Admit: 2022-03-18 | Discharge: 2022-03-18 | Disposition: A | Discharge status: Home / Self Care | Payer: Managed Care, Other (non HMO) | Source: Ambulatory Visit | Attending: Primary Care | Admitting: Primary Care

## 2022-03-18 ENCOUNTER — Ambulatory Visit: Payer: Managed Care, Other (non HMO)

## 2022-03-18 DIAGNOSIS — N644 Mastodynia: Secondary | ICD-10-CM

## 2022-04-09 ENCOUNTER — Ambulatory Visit: Payer: Managed Care, Other (non HMO) | Admitting: Primary Care

## 2022-04-09 ENCOUNTER — Encounter: Payer: Self-pay | Admitting: Primary Care

## 2022-04-09 VITALS — BP 136/72 | HR 90 | Temp 97.4°F | Ht 66.0 in | Wt 175.0 lb

## 2022-04-09 DIAGNOSIS — M545 Low back pain, unspecified: Secondary | ICD-10-CM | POA: Diagnosis not present

## 2022-04-09 DIAGNOSIS — E538 Deficiency of other specified B group vitamins: Secondary | ICD-10-CM

## 2022-04-09 DIAGNOSIS — G629 Polyneuropathy, unspecified: Secondary | ICD-10-CM | POA: Diagnosis not present

## 2022-04-09 DIAGNOSIS — R7303 Prediabetes: Secondary | ICD-10-CM

## 2022-04-09 DIAGNOSIS — G8929 Other chronic pain: Secondary | ICD-10-CM

## 2022-04-09 LAB — CBC
HCT: 41.2 % (ref 36.0–46.0)
Hemoglobin: 13.4 g/dL (ref 12.0–15.0)
MCHC: 32.6 g/dL (ref 30.0–36.0)
MCV: 85.8 fl (ref 78.0–100.0)
Platelets: 254 10*3/uL (ref 150.0–400.0)
RBC: 4.81 Mil/uL (ref 3.87–5.11)
RDW: 16.1 % — ABNORMAL HIGH (ref 11.5–15.5)
WBC: 5 10*3/uL (ref 4.0–10.5)

## 2022-04-09 LAB — LUTEINIZING HORMONE: LH: 36.01 m[IU]/mL

## 2022-04-09 LAB — IBC + FERRITIN
Ferritin: 20.3 ng/mL (ref 10.0–291.0)
Iron: 49 ug/dL (ref 42–145)
Saturation Ratios: 10.7 % — ABNORMAL LOW (ref 20.0–50.0)
TIBC: 457.8 ug/dL — ABNORMAL HIGH (ref 250.0–450.0)
Transferrin: 327 mg/dL (ref 212.0–360.0)

## 2022-04-09 LAB — FOLLICLE STIMULATING HORMONE: FSH: 65.6 m[IU]/mL

## 2022-04-09 LAB — VITAMIN B12: Vitamin B-12: 369 pg/mL (ref 211–911)

## 2022-04-09 LAB — HEMOGLOBIN A1C: Hgb A1c MFr Bld: 6 % (ref 4.6–6.5)

## 2022-04-09 NOTE — Progress Notes (Signed)
Established Patient Office Visit  Subjective   Patient ID: Ashley [name], female    DOB: [birth year]  Age: 57 y.o. MRN: [id number]  Chief Complaint  Patient presents with   Fatigue    Feels jittery at times with abdominal pain x 2 weeks.     HPI  Ashley [name]. Ashley [name] is a 57 year old female with past medical history of hypertension, GERD, TIA, diverticulitis and prediabetes.   She has a history of lower back pain and feels that radiates to her lower abdomen, like a "belt" and that radiates to her legs and feet. She denies any numbness and tinglings. She does have burning in her feet. She saw her neurologist yesterday and repeat MRI was discussed. She will be scheduled for another MRI. She saw physiologist and they will give her another steroid injection in her lower back next Friday. She had a CMP and urinalysis yesterday and it was negative.   Her symptoms have not affected her ROM. She is currently on Gabapentin but feels that it is not working. She discussed this with the neurologist and another medication was discussed however she does not know the name of it.  She denies any nausea, vomiting, dizziness, blood in stool or urine, constipation or diarrhea.   She had a steroid injection two weeks ago in her hip and is concerned that her blood sugar levels are elevated. Her CMP yesterday showed glucose was 109. She has been experiencing excess hunger and increased urination.   She has as history of GERD and restarted her pantoprazole 40 mg daily last week. She has gotten relief with that. She had been having experiencing a lot of gas, belching and upper abdominal pain and since restarting the pantoprazole, her symptoms have resolved.   She denies any vaginal burning, abdominal bleeding. She does have occasional night sweats and has a history of vaginal itching. She denies any hot flashes. She would like to get her estrogen levels checked due to the joint pain she is  having.  Patient Active Problem List   Diagnosis Date Noted   Acute breast pain 01/30/2022   Chronic pain of right knee 01/30/2022   Dark urine 01/30/2022   TMJ (temporomandibular joint syndrome) 09/07/2021   Diverticulitis of colon    Polyp of sigmoid colon    Urinary frequency 06/29/2021   Acute right flank pain 06/29/2021   Constipation 06/29/2021   Left lower quadrant abdominal pain 06/29/2021   History of diverticulitis 06/29/2021   Adjustment reaction with anxiety and depression 05/09/2021   Palpitations 10/04/2020   Prediabetes 06/15/2020   Chronic hand pain 05/10/2020   Neuropathy 12/13/2019   Lumbar stenosis without neurogenic claudication 12/09/2019   Cervical radiculitis 11/19/2019   Foraminal stenosis of cervical region 11/19/2019   Dizziness 10/07/2019   Chronic bilateral low back pain 09/10/2019   HSV (herpes simplex virus) anogenital infection 06/25/2019   Encounter for annual general medical examination with abnormal findings in adult 06/23/2019   Chronic neck pain 02/17/2019   Essential hypertension    History of TIA (transient ischemic attack)    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   Numbness and tingling 02/01/2019   Ganglion cyst of tendon sheath of left hand 11/13/2018   Acquired trigger finger 10/26/2018   Osteoarthritis 06/30/2018   Vitamin D deficiency 06/23/2018   Vitamin B 12 deficiency 06/23/2018   GERD (gastroesophageal reflux disease) 01/30/2018   Hyperlipidemia 03/19/2017   Allergic rhinitis 03/19/2017   Impingement syndrome of left shoulder  12/27/2014   Impingement syndrome of right shoulder 12/27/2014   Chronic right shoulder pain 11/29/2014   Past Medical History:  Diagnosis Date   Abdominal pain 06/23/2018   Acute midline low back pain without sciatica 09/10/2019   Adverse effect of drug 08/15/2019   Anemia    Bronchitis    2 years ago    [facility] arthritis 2019   base of left thumb    Constipation 04/07/2019   Discoloration of skin  05/17/2021   GERD (gastroesophageal reflux disease)    diet controlled , no meds   Hydrosalpinx    bilateral    Hyperlipidemia    recent dx - currently diet control - no meds   Hypertension    Hypothyroid 2003   radio active iodine - no meds   Left-sided weakness 02/12/2019   Ovarian cyst, left 02/23/2016   S/P shoulder surgery 02/28/2015   SVD (spontaneous vaginal delivery)    x 1   Urinary frequency 11/30/2018   Vitamin B 12 deficiency    Past Surgical History:  Procedure Laterality Date   CESAREAN SECTION     x 2   CHOLECYSTECTOMY  2008   COLONOSCOPY WITH PROPOFOL N/A 08/06/2021   Procedure: COLONOSCOPY WITH PROPOFOL;  Surgeon: [name], MD;  Location: [place];  Service: Endoscopy;  Laterality: N/A;   DILATION AND CURETTAGE OF UTERUS     X 2   ESOPHAGOGASTRODUODENOSCOPY (EGD) WITH PROPOFOL N/A 08/06/2021   Procedure: ESOPHAGOGASTRODUODENOSCOPY (EGD) WITH PROPOFOL;  Surgeon: [name], MD;  Location: [place];  Service: Endoscopy;  Laterality: N/A;   LAPAROSCOPIC BILATERAL SALPINGECTOMY Bilateral 07/29/2017   Procedure: LAPAROSCOPIC LEFT SALPINGOOPHRECTOMY: Partial right Salpingectomy;  Surgeon: Lavonia Drafts, MD;  Location: [place];  Service: Gynecology;  Laterality: Bilateral;   POLYPECTOMY  08/06/2021   Procedure: POLYPECTOMY;  Surgeon: [name], MD;  Location: [place];  Service: Endoscopy;;   SHOULDER SURGERY  2016   arthroscopic   TOTAL ABDOMINAL HYSTERECTOMY  2006   Fibroids, benign pathology; partial hysterectomy per patient    TUBAL LIGATION     WISDOM TOOTH EXTRACTION     at age 26 yr   WRIST ARTHROSCOPY Right 2007   Family History  Problem Relation Age of Onset   Hypertension Mother    Ovarian cancer Mother    Other Father        unknown medical history   Hypertension Maternal Grandmother    Heart failure Maternal Grandmother    Lung cancer Maternal Grandmother    Breast cancer  Maternal Aunt    Colon cancer Neg Hx    Colon polyps Neg Hx    Rectal cancer Neg Hx    Stomach cancer Neg Hx    Allergies  Allergen Reactions   Aczone [Dapsone] Swelling   Levaquin [Levofloxacin]     Presumed cause of joint aches      Review of Systems  Constitutional:  Negative for chills, fever and malaise/fatigue.  Eyes:  Positive for blurred vision.  Respiratory:  Negative for shortness of breath.   Cardiovascular:  Negative for chest pain.  Gastrointestinal:  Negative for blood in stool, constipation, diarrhea, nausea and vomiting.  Genitourinary:  Positive for frequency.  Musculoskeletal:  Positive for back pain and myalgias. Negative for falls.  Neurological:  Negative for dizziness, weakness and headaches.  Endo/Heme/Allergies:  Positive for polydipsia.  Psychiatric/Behavioral:  Negative for depression. The patient is nervous/anxious.       Objective:  BP 136/72   Pulse 90   Temp (!) 97.4 F (36.3 C) (Temporal)   Ht 5' 6"$  (1.676 m)   Wt 175 lb (79.4 kg)   SpO2 99%   BMI 28.25 kg/m  BP Readings from Last 3 Encounters:  04/09/22 136/72  01/30/22 136/72  01/07/22 132/87   Wt Readings from Last 3 Encounters:  04/09/22 175 lb (79.4 kg)  01/30/22 175 lb (79.4 kg)  01/07/22 172 lb (78 kg)      Physical Exam Vitals and nursing note reviewed.  Constitutional:      Appearance: Normal appearance.  Cardiovascular:     Rate and Rhythm: Normal rate and regular rhythm.     Pulses: Normal pulses.     Heart sounds: Normal heart sounds.  Pulmonary:     Effort: Pulmonary effort is normal.     Breath sounds: Normal breath sounds.  Abdominal:     General: Bowel sounds are normal.     Palpations: Abdomen is soft.     Tenderness: There is no abdominal tenderness. There is no right CVA tenderness or left CVA tenderness.  Musculoskeletal:        General: Normal range of motion.  Neurological:     Mental Status: She is alert and oriented to person, place, and  time.     Motor: No weakness.  Psychiatric:        Mood and Affect: Mood normal.        Behavior: Behavior normal.      No results found for any visits on 04/09/22.     The 10-year ASCVD risk score (Arnett DK, et al., 2019) is: 4.8%    Assessment & Plan:   Problem List Items Addressed This Visit       Nervous and Auditory   Neuropathy - Primary    Uncontrolled.  Followed by neurology.  Reviewed labs and notes from 04/08/22.   Continue Gabapentin and start nortriptyline as per neurology recommendation.   Will check labs to rule out metabolic cause.   MRI of lower back pending.      Relevant Orders   Follicle Stimulating Hormone   Luteinizing hormone   Estrogens, Total     Other   Vitamin B 12 deficiency   Relevant Orders   CBC   IBC + Ferritin   Vitamin B12   Chronic bilateral low back pain    Uncontrolled.   No red flags on physical exam.   Follow up with physiologist as scheduled for steroid injection.  MRI of lower back pain pending.         Prediabetes    Overall controlled. Recent steroid injection and current symptoms, will recheck A1C.   Repeat A1C pending.       Relevant Orders   Hemoglobin A1c   CBC   IBC + Ferritin    No follow-ups on file.    Tinnie Gens, BSN-RN, DNP STUDENT

## 2022-04-09 NOTE — Assessment & Plan Note (Addendum)
Uncontrolled.  Followed by neurology.  Reviewed labs and notes from 04/08/22.   Continue Gabapentin 600 mg BID and start nortriptyline 10-20 mg HS as per neurology recommendation.   Will check labs to rule out metabolic cause.   Await MRI results.   I evaluated patient, was consulted regarding treatment, and agree with assessment and plan per Tinnie Gens, RN, DNP student.   Allie Bossier, NP-C

## 2022-04-09 NOTE — Assessment & Plan Note (Addendum)
Uncontrolled.   No red flags on physical exam.  Start nortriptyline as prescribed.  Follow up with physiatry and neurology as scheduled.  Await MRI results.  I evaluated patient, was consulted regarding treatment, and agree with assessment and plan per Tinnie Gens, RN, DNP student.   Allie Bossier, NP-C

## 2022-04-09 NOTE — Assessment & Plan Note (Addendum)
Overall controlled. Given recent steroid injection and current symptoms, will recheck A1C.   Repeat A1C pending.   I evaluated patient, was consulted regarding treatment, and agree with assessment and plan per Tinnie Gens, RN, DNP student.   Allie Bossier, NP-C

## 2022-04-09 NOTE — Patient Instructions (Addendum)
Stop by the lab prior to leaving today. I will notify you of your results once received.   Try the nortriptyline as discussed with neurology.  Complete MRI.  It was a pleasure to see you today!

## 2022-04-09 NOTE — Progress Notes (Signed)
Subjective:    Patient ID: Ashley [name], female    DOB: 1966/01/17, 57 y.o.   MRN: [id number]  HPI  Ashley [name] is a very pleasant 57 y.o. female with a history of hypertension, GERD, neuropathy, osteoarthritis, prediabetes, allergic rhinitis, diverticulitis who presents today to discuss multiple symptoms.    Symptom onset two weeks ago with bilateral lower back pain with radiation to bilateral lower abdomen and down to bilateral lower extremities. Evaluated by neurology yesterday who ordered repeat MRI lumbar spine. CMP and UA were negative yesterday. Nortriptyline 10-20 mg was added to her regimen. Gabapentin may be switched to Lyrica. Her symptoms have become so bothersome that she's experienced increased anxiety. She will take Motrin 600 mg HS on occasion for severe pain episode.   She's also been evaluated by physiology who will complete a lumbar spine injection next week. She did receive a steroid injection to the hip 2 weeks ago, is concerned her glucose levels may be elevated.   She's also noticed excess hunger and increased urination. She's also increased her intake of water. She would like her hormone levels checked given her ongoing joint symptoms. She has completed menopause, history of hysterectomy.   She denies numbness/tingling. She is currently managed on gabapentin, doesn't feel that it is working well. Has discussed with neurology. She denies nausea, vomiting, diarrhea.   She has noticed increased gas, belching, and mid/upper abdominal pain. She contacted her GI provider who resumed her pantoprazole 40 mg daily, symptoms have resolved.     BP Readings from Last 3 Encounters:  04/09/22 136/72  01/30/22 136/72  01/07/22 132/87      Review of Systems  Constitutional:  Negative for fatigue and fever.  Gastrointestinal:  Negative for diarrhea, nausea and vomiting.  Endocrine: Positive for polydipsia and polyuria.  Musculoskeletal:  Positive for  arthralgias, back pain and myalgias.  Neurological:  Negative for dizziness, weakness and headaches.  Psychiatric/Behavioral:  The patient is nervous/anxious.          Past Medical History:  Diagnosis Date   Abdominal pain 06/23/2018   Acute midline low back pain without sciatica 09/10/2019   Adverse effect of drug 08/15/2019   Anemia    Bronchitis    2 years ago    [facility] arthritis 2019   base of left thumb    Constipation 04/07/2019   Discoloration of skin 05/17/2021   GERD (gastroesophageal reflux disease)    diet controlled , no meds   Hydrosalpinx    bilateral    Hyperlipidemia    recent dx - currently diet control - no meds   Hypertension    Hypothyroid 2003   radio active iodine - no meds   Left-sided weakness 02/12/2019   Ovarian cyst, left 02/23/2016   S/P shoulder surgery 02/28/2015   SVD (spontaneous vaginal delivery)    x 1   Urinary frequency 11/30/2018   Vitamin B 12 deficiency     Social History   Socioeconomic History   Marital status: Single    Spouse name: Not on file   Number of children: Not on file   Years of education: Not on file   Highest education level: Not on file  Occupational History   Not on file  Tobacco Use   Smoking status: Never   Smokeless tobacco: Never  Vaping Use   Vaping Use: Never used  Substance and Sexual Activity   Alcohol use: No   Drug use: No   Sexual activity: Yes  Partners: Male    Birth control/protection: Surgical    Comment: hysterectomy.  Other Topics Concern   Not on file  Social History Narrative   Single.   3 children.   Works at [employer] as a Designer, multimedia.   Enjoys relaxing, exercise, walking her dog.    Social Determinants of Health   Financial Resource Strain: Not on file  Food Insecurity: Not on file  Transportation Needs: Not on file  Physical Activity: Not on file  Stress: Not on file  Social Connections: Not on file  Intimate Partner Violence: Not on file    Past Surgical History:   Procedure Laterality Date   CESAREAN SECTION     x 2   CHOLECYSTECTOMY  2008   COLONOSCOPY WITH PROPOFOL N/A 08/06/2021   Procedure: COLONOSCOPY WITH PROPOFOL;  Surgeon: [name], MD;  Location: [place];  Service: Endoscopy;  Laterality: N/A;   DILATION AND CURETTAGE OF UTERUS     X 2   ESOPHAGOGASTRODUODENOSCOPY (EGD) WITH PROPOFOL N/A 08/06/2021   Procedure: ESOPHAGOGASTRODUODENOSCOPY (EGD) WITH PROPOFOL;  Surgeon: [name], MD;  Location: [place];  Service: Endoscopy;  Laterality: N/A;   LAPAROSCOPIC BILATERAL SALPINGECTOMY Bilateral 07/29/2017   Procedure: LAPAROSCOPIC LEFT SALPINGOOPHRECTOMY: Partial right Salpingectomy;  Surgeon: Lavonia Drafts, MD;  Location: [place];  Service: Gynecology;  Laterality: Bilateral;   POLYPECTOMY  08/06/2021   Procedure: POLYPECTOMY;  Surgeon: [name], MD;  Location: [place];  Service: Endoscopy;;   SHOULDER SURGERY  2016   arthroscopic   TOTAL ABDOMINAL HYSTERECTOMY  2006   Fibroids, benign pathology; partial hysterectomy per patient    TUBAL LIGATION     WISDOM TOOTH EXTRACTION     at age 28 yr   WRIST ARTHROSCOPY Right 2007    Family History  Problem Relation Age of Onset   Hypertension Mother    Ovarian cancer Mother    Other Father        unknown medical history   Hypertension Maternal Grandmother    Heart failure Maternal Grandmother    Lung cancer Maternal Grandmother    Breast cancer Maternal Aunt    Colon cancer Neg Hx    Colon polyps Neg Hx    Rectal cancer Neg Hx    Stomach cancer Neg Hx     Allergies  Allergen Reactions   Aczone [Dapsone] Swelling   Levaquin [Levofloxacin]     Presumed cause of joint aches    Current Outpatient Medications on File Prior to Visit  Medication Sig Dispense Refill   amLODipine (NORVASC) 5 MG tablet Take 1 tablet (5 mg total) by mouth daily. for blood pressure. 90 tablet 3   aspirin 81 MG EC tablet Take by mouth.      atorvastatin (LIPITOR) 10 MG tablet Take 1 tablet (10 mg total) by mouth daily. for cholesterol. 90 tablet 3   azelastine (ASTELIN) 0.1 % nasal spray Place into the nose.     fluticasone (FLONASE) 50 MCG/ACT nasal spray Place 1 spray into both nostrils 2 (two) times daily. 16 g 2   gabapentin (NEURONTIN) 600 MG tablet Take 1,200 mg by mouth daily.     pantoprazole (PROTONIX) 40 MG tablet Take 1 tablet (40 mg total) by mouth 2 (two) times daily. 60 tablet 1   cetirizine (ZYRTEC) 10 MG tablet as needed for allergies. (Patient not taking: Reported on 04/09/2022)     cyclobenzaprine (FLEXERIL) 5 MG tablet Take 1 tablet (5 mg total) by mouth  3 (three) times daily as needed for muscle spasms. (Patient not taking: Reported on 04/09/2022) 30 tablet 0   ezetimibe (ZETIA) 10 MG tablet Take 1 tablet (10 mg total) by mouth daily. (Patient not taking: Reported on 04/09/2022) 90 tablet 3   No current facility-administered medications on file prior to visit.    BP 136/72   Pulse 90   Temp (!) 97.4 F (36.3 C) (Temporal)   Ht 5' 6"$  (1.676 m)   Wt 175 lb (79.4 kg)   SpO2 99%   BMI 28.25 kg/m  Objective:   Physical Exam Cardiovascular:     Rate and Rhythm: Normal rate and regular rhythm.  Pulmonary:     Effort: Pulmonary effort is normal.     Breath sounds: Normal breath sounds.  Musculoskeletal:     Cervical back: Neck supple.  Skin:    General: Skin is warm and dry.  Psychiatric:        Mood and Affect: Mood normal.           Assessment & Plan:  Neuropathy Assessment & Plan: Uncontrolled.  Followed by neurology.  Reviewed labs and notes from 04/08/22.   Continue Gabapentin 600 mg BID and start nortriptyline 10-20 mg HS as per neurology recommendation.   Will check labs to rule out metabolic cause.   Await MRI results.   I evaluated patient, was consulted regarding treatment, and agree with assessment and plan per Tinnie Gens, RN, DNP student.   Allie Bossier, NP-C   Orders: -      Follicle stimulating hormone -     Luteinizing hormone -     Estrogens, total  Prediabetes Assessment & Plan: Overall controlled. Given recent steroid injection and current symptoms, will recheck A1C.   Repeat A1C pending.   I evaluated patient, was consulted regarding treatment, and agree with assessment and plan per Tinnie Gens, RN, DNP student.   Allie Bossier, NP-C   Orders: -     Hemoglobin A1c -     CBC -     IBC + Ferritin  Vitamin B 12 deficiency -     CBC -     IBC + Ferritin -     Vitamin B12  Chronic bilateral low back pain, unspecified whether sciatica present Assessment & Plan: Uncontrolled.   No red flags on physical exam.  Start nortriptyline as prescribed.  Follow up with physiatry and neurology as scheduled.  Await MRI results.  I evaluated patient, was consulted regarding treatment, and agree with assessment and plan per Tinnie Gens, RN, DNP student.   Allie Bossier, NP-C            Pleas Koch, NP

## 2022-04-15 LAB — ESTROGENS, TOTAL: Estrogen: 42 pg/mL

## 2022-04-16 ENCOUNTER — Other Ambulatory Visit: Payer: Self-pay | Admitting: Physician Assistant

## 2022-04-16 DIAGNOSIS — R208 Other disturbances of skin sensation: Secondary | ICD-10-CM

## 2022-04-16 DIAGNOSIS — M79602 Pain in left arm: Secondary | ICD-10-CM

## 2022-04-16 DIAGNOSIS — M5440 Lumbago with sciatica, unspecified side: Secondary | ICD-10-CM

## 2022-04-16 DIAGNOSIS — R531 Weakness: Secondary | ICD-10-CM

## 2022-04-16 DIAGNOSIS — M542 Cervicalgia: Secondary | ICD-10-CM

## 2022-04-16 DIAGNOSIS — R202 Paresthesia of skin: Secondary | ICD-10-CM

## 2022-04-17 ENCOUNTER — Ambulatory Visit
Admission: RE | Admit: 2022-04-17 | Discharge: 2022-04-17 | Disposition: A | Discharge status: Home / Self Care | Payer: Managed Care, Other (non HMO) | Source: Ambulatory Visit | Attending: Physician Assistant | Admitting: Physician Assistant

## 2022-04-17 DIAGNOSIS — M542 Cervicalgia: Secondary | ICD-10-CM

## 2022-04-17 DIAGNOSIS — M79602 Pain in left arm: Secondary | ICD-10-CM

## 2022-04-17 DIAGNOSIS — R208 Other disturbances of skin sensation: Secondary | ICD-10-CM

## 2022-04-17 DIAGNOSIS — M5440 Lumbago with sciatica, unspecified side: Secondary | ICD-10-CM

## 2022-04-17 DIAGNOSIS — R202 Paresthesia of skin: Secondary | ICD-10-CM

## 2022-04-17 DIAGNOSIS — R531 Weakness: Secondary | ICD-10-CM

## 2022-04-29 ENCOUNTER — Other Ambulatory Visit: Payer: Managed Care, Other (non HMO)

## 2022-05-14 ENCOUNTER — Encounter: Payer: Self-pay | Admitting: Obstetrics & Gynecology

## 2022-05-14 ENCOUNTER — Ambulatory Visit: Payer: Managed Care, Other (non HMO) | Admitting: Obstetrics & Gynecology

## 2022-05-14 VITALS — BP 151/92 | HR 101 | Wt 178.0 lb

## 2022-05-14 DIAGNOSIS — N951 Menopausal and female climacteric states: Secondary | ICD-10-CM | POA: Diagnosis not present

## 2022-05-14 MED ORDER — VENLAFAXINE HCL ER 75 MG PO CP24: 75.0000 mg | ORAL_CAPSULE | Freq: Every day | ORAL | 3 refills | Status: DC

## 2022-05-14 NOTE — Patient Instructions (Addendum)
For vaginal lubrication  Water based hypoallergenic lubricants  Hyaluronic acid vaginal moisturizer (over the counter, online, examples are Hyalo-GYN, Revaree)  Vaginal estrogen   Visit www.[email or web address].org  If none of these work, will consider transdermal (patch) estrogen

## 2022-05-14 NOTE — Progress Notes (Signed)
GYNECOLOGY OFFICE VISIT NOTE  History:   Ashley [name] is a 57 y.o. [phone] PMP female s/p hysterectomy for benign indications many years ago here today for discussion about management of menopausal symptoms. Reports worsening hot flashes, night sweats and insomnia. Also reports vaginal dryness, this is not very bothersome now as she is not sexually active currently. She denies any abnormal vaginal discharge, bleeding, pelvic pain or other concerns.    Past Medical History:  Diagnosis Date   Abdominal pain 06/23/2018   Acute midline low back pain without sciatica 09/10/2019   Adverse effect of drug 08/15/2019   Anemia    Bronchitis    2 years ago    [facility] arthritis 2019   base of left thumb    Constipation 04/07/2019   Discoloration of skin 05/17/2021   GERD (gastroesophageal reflux disease)    diet controlled , no meds   Hydrosalpinx    bilateral    Hyperlipidemia    recent dx - currently diet control - no meds   Hypertension    Hypothyroid 2003   radio active iodine - no meds   Left-sided weakness 02/12/2019   Ovarian cyst, left 02/23/2016   Urinary frequency 11/30/2018   Vitamin B 12 deficiency     Past Surgical History:  Procedure Laterality Date   CESAREAN SECTION     x 2   CHOLECYSTECTOMY  2008   COLONOSCOPY WITH PROPOFOL N/A 08/06/2021   Procedure: COLONOSCOPY WITH PROPOFOL;  Surgeon: [name], MD;  Location: [place];  Service: Endoscopy;  Laterality: N/A;   DILATION AND CURETTAGE OF UTERUS     X 2   ESOPHAGOGASTRODUODENOSCOPY (EGD) WITH PROPOFOL N/A 08/06/2021   Procedure: ESOPHAGOGASTRODUODENOSCOPY (EGD) WITH PROPOFOL;  Surgeon: [name], MD;  Location: [place];  Service: Endoscopy;  Laterality: N/A;   LAPAROSCOPIC BILATERAL SALPINGECTOMY Bilateral 07/29/2017   Procedure: LAPAROSCOPIC LEFT SALPINGOOPHRECTOMY: Partial right Salpingectomy;  Surgeon: Lavonia Drafts, MD;  Location: [place];   Service: Gynecology;  Laterality: Bilateral;   POLYPECTOMY  08/06/2021   Procedure: POLYPECTOMY;  Surgeon: [name], MD;  Location: [place];  Service: Endoscopy;;   SHOULDER SURGERY  2016   arthroscopic   TOTAL ABDOMINAL HYSTERECTOMY  2006   Fibroids, benign pathology   TUBAL LIGATION     WISDOM TOOTH EXTRACTION     at age 34 yr   WRIST ARTHROSCOPY Right 2007    The following portions of the patient's history were reviewed and updated as appropriate: allergies, current medications, past family history, past medical history, past social history, past surgical history and problem list.    Review of Systems:  Pertinent items noted in HPI and remainder of comprehensive ROS otherwise negative.  Physical Exam:  BP (!) 151/92   Pulse (!) 101   Wt 178 lb (80.7 kg)   BMI 28.73 kg/m  Vitals:   05/14/22 0904 05/14/22 0937  BP: (!) 150/83 (!) 151/92   CONSTITUTIONAL: Well-developed, well-nourished female in no acute distress.  HEENT:  Normocephalic, atraumatic. External right and left ear normal. No scleral icterus.  NECK: Normal range of motion, supple, no masses noted on observation SKIN: No rash noted. Not diaphoretic. No erythema. No pallor. MUSCULOSKELETAL: Normal range of motion. No edema noted. NEUROLOGIC: Alert and oriented to person, place, and time. Normal muscle tone coordination. No cranial nerve deficit noted. PSYCHIATRIC: Normal mood and affect. Normal behavior. Normal judgment and thought content. CARDIOVASCULAR: Normal heart rate noted RESPIRATORY: Effort and breath  sounds normal, no problems with respiration noted ABDOMEN: No masses noted. No other overt distention noted.   PELVIC: Deferred    Assessment and Plan:    1. Menopausal symptoms Patient with bothersome menopausal vasomotor symptoms. Discussed lifestyle interventions such as wearing light clothing, remaining in cool environments, having fan/air conditioner in the room, avoiding hot beverages  etc.  Discussed using hormone therapy and concerns about increased risk of heart disease, cerebrovascular disease, thromboembolic disease,  and breast cancer; especially worried about her HTN.  Also discussed other medical options such as Effexor and Neurontin; patient is already on Neurontin 600 mg qhs for other indications and I asked that she try 900 mg qhs.   Also discussed alternative therapies such as herbal remedies but cautioned that most of the products contained phytoestrogens (plant estrogens) in unregulated amounts which can have the same effects on the body as the pharmaceutical estrogen preparations.  Also referred her to www.menopause.org for other alternative options.  Patient opted for Effexor for now, can increase dosage to 150 mg daily in a couple of week if no response.  Once her HTN is controlled and if her symptoms are not controlled, can consider low dose transdermal estrogen (less side effect profile compared to oral formulations).  Also discussed her vaginal dryness; gave options of water based hypoallergenic lubricants, hyaluronic acid vaginal moisturizers or vaginal estrogen. She will research these and let us know what she decides to do. - venlafaxine XR (EFFEXOR-XR) 75 MG 24 hr capsule; Take 1 capsule (75 mg total) by mouth daily.  Dispense: 60 capsule; Refill: 3   Routine preventative health maintenance measures emphasized. Please refer to After Visit Summary for other counseling recommendations.   Return in about 1 month (around 06/14/2022) for Annual exam and follow up menopausal symptoms.    I spent 30 minutes dedicated to the care of this patient including pre-visit review of records, face to face time with the patient discussing her conditions and treatments and post visit orders.    Verita Schneiders, MD, [place] for [employer], [place]

## 2022-06-03 ENCOUNTER — Emergency Department: Payer: Managed Care, Other (non HMO)

## 2022-06-03 ENCOUNTER — Other Ambulatory Visit: Payer: Self-pay

## 2022-06-03 ENCOUNTER — Emergency Department
Admission: EM | Admit: 2022-06-03 | Discharge: 2022-06-03 | Disposition: A | Discharge status: Home / Self Care | Payer: Managed Care, Other (non HMO) | Attending: Emergency Medicine | Admitting: Emergency Medicine

## 2022-06-03 DIAGNOSIS — H538 Other visual disturbances: Secondary | ICD-10-CM | POA: Insufficient documentation

## 2022-06-03 DIAGNOSIS — R519 Headache, unspecified: Secondary | ICD-10-CM | POA: Diagnosis present

## 2022-06-03 DIAGNOSIS — G43809 Other migraine, not intractable, without status migrainosus: Secondary | ICD-10-CM | POA: Diagnosis not present

## 2022-06-03 DIAGNOSIS — R42 Dizziness and giddiness: Secondary | ICD-10-CM | POA: Insufficient documentation

## 2022-06-03 LAB — CBC
HCT: 41.5 % (ref 36.0–46.0)
Hemoglobin: 13.4 g/dL (ref 12.0–15.0)
MCH: 27.6 pg (ref 26.0–34.0)
MCHC: 32.3 g/dL (ref 30.0–36.0)
MCV: 85.6 fL (ref 80.0–100.0)
Platelets: 273 10*3/uL (ref 150–400)
RBC: 4.85 MIL/uL (ref 3.87–5.11)
RDW: 15.6 % — ABNORMAL HIGH (ref 11.5–15.5)
WBC: 4.4 10*3/uL (ref 4.0–10.5)
nRBC: 0 % (ref 0.0–0.2)

## 2022-06-03 LAB — URINALYSIS, ROUTINE W REFLEX MICROSCOPIC
Bilirubin Urine: NEGATIVE
Glucose, UA: NEGATIVE mg/dL
Hgb urine dipstick: NEGATIVE
Ketones, ur: NEGATIVE mg/dL
Leukocytes,Ua: NEGATIVE
Nitrite: NEGATIVE
Protein, ur: NEGATIVE mg/dL
Specific Gravity, Urine: 1.003 — ABNORMAL LOW (ref 1.005–1.030)
pH: 6 (ref 5.0–8.0)

## 2022-06-03 LAB — C-REACTIVE PROTEIN: CRP: <0.5 mg/dL (ref <1.0)

## 2022-06-03 LAB — BASIC METABOLIC PANEL
Anion gap: 11 (ref 5–15)
BUN: 12 mg/dL (ref 6–20)
CO2: 18 mmol/L — ABNORMAL LOW (ref 22–32)
Calcium: 9.4 mg/dL (ref 8.9–10.3)
Chloride: 106 mmol/L (ref 98–111)
Creatinine, Ser: 0.78 mg/dL (ref 0.44–1.00)
GFR, Estimated: >60 mL/min (ref >60)
Glucose, Bld: 105 mg/dL — ABNORMAL HIGH (ref 70–99)
Potassium: 3.9 mmol/L (ref 3.5–5.1)
Sodium: 135 mmol/L (ref 135–145)

## 2022-06-03 LAB — SEDIMENTATION RATE: Sed Rate: 10 mm/hr (ref 0–30)

## 2022-06-03 MED ORDER — METOCLOPRAMIDE HCL 5 MG/ML IJ SOLN
10.0000 mg | Freq: Once | INTRAMUSCULAR | Status: AC
  Administered 2022-06-03: 10 mg via INTRAVENOUS
  Filled 2022-06-03: qty 2

## 2022-06-03 MED ORDER — DIPHENHYDRAMINE HCL 50 MG/ML IJ SOLN
50.0000 mg | Freq: Once | INTRAMUSCULAR | Status: AC
  Administered 2022-06-03: 50 mg via INTRAVENOUS
  Filled 2022-06-03: qty 1

## 2022-06-03 MED ORDER — LORAZEPAM 2 MG/ML IJ SOLN
1.0000 mg | Freq: Once | INTRAMUSCULAR | Status: AC
  Administered 2022-06-03: 1 mg via INTRAVENOUS
  Filled 2022-06-03: qty 1

## 2022-06-03 NOTE — ED Triage Notes (Signed)
Pt c/o migraine x1 month. Pt states no hx of migraines. Pt bent over this morning and had a sharp pain in the right temple. Pt c/o dizziness and lightheadedness x1 month. Has an appointment to see PCP on the 16th but states she could not wait that long.

## 2022-06-03 NOTE — ED Notes (Signed)
Pt to MRI

## 2022-06-03 NOTE — ED Notes (Signed)
Pt to CT

## 2022-06-03 NOTE — ED Provider Notes (Signed)
[facility] Provider Note  Patient Contact: 3:34 PM (approximate)   History   Migraine   HPI  Ashley [name] is a 57 y.o. female with an unremarkable past medical history, presents to the emergency department with right-sided headache.  Patient reports that she has had this headache off and on for approximately 1 month but her symptoms have worsened in intensity over the past 2 weeks.  Patient states that she has associated lightheadedness and right-sided blurry vision with her headache.  She denies fever or chills.  No recent changes in her medications.  No recent falls or other mechanisms of trauma.  No weakness in the upper and lower extremities.      Physical Exam   Triage Vital Signs: ED Triage Vitals  Enc Vitals Group     BP 06/03/22 1403 (!) 151/103     Pulse Rate 06/03/22 1403 96     Resp 06/03/22 1403 18     Temp 06/03/22 1403 98.6 F (37 C)     Temp Source 06/03/22 1403 Oral     SpO2 06/03/22 1403 95 %     Weight 06/03/22 1404 175 lb (79.4 kg)     Height 06/03/22 1404 5\' 6"  (1.676 m)     Head Circumference --      Peak Flow --      Pain Score 06/03/22 1403 7     Pain Loc --      Pain Edu? --      Excl. in GC? --     Most recent vital signs: Vitals:   06/03/22 1920 06/03/22 2237  BP: 134/84 (!) 145/96  Pulse: 72 88  Resp: 17 16  Temp: 98.4 F (36.9 C) 98.3 F (36.8 C)  SpO2: 96% 96%     General: Alert and in no acute distress. Eyes:  PERRL. EOMI. Head: No acute traumatic findings ENT:      Nose: No congestion/rhinnorhea.      Mouth/Throat: Mucous membranes are moist. Neck: No stridor. No cervical spine tenderness to palpation. Cardiovascular:  Good peripheral perfusion Respiratory: Normal respiratory effort without tachypnea or retractions. Lungs CTAB. Good air entry to the bases with no decreased or absent breath sounds. Gastrointestinal: Bowel sounds 4 quadrants. Soft and nontender to palpation. No guarding or  rigidity. No palpable masses. No distention. No CVA tenderness. Musculoskeletal: Full range of motion to all extremities.  Neurologic:  No gross focal neurologic deficits are appreciated.  Skin:   No rash noted    ED Results / Procedures / Treatments   Labs (all labs ordered are listed, but only abnormal results are displayed) Labs Reviewed  BASIC METABOLIC PANEL - Abnormal; Notable for the following components:      Result Value   CO2 18 (*)    Glucose, Bld 105 (*)    All other components within normal limits  CBC - Abnormal; Notable for the following components:   RDW 15.6 (*)    All other components within normal limits  URINALYSIS, ROUTINE W REFLEX MICROSCOPIC - Abnormal; Notable for the following components:   Color, Urine STRAW (*)    APPearance CLEAR (*)    Specific Gravity, Urine 1.003 (*)    All other components within normal limits  SEDIMENTATION RATE  C-REACTIVE PROTEIN  CBG MONITORING, ED        RADIOLOGY  I personally viewed and evaluated these images as part of my medical decision making, as well as reviewing the written report by  the radiologist.  ED Provider Interpretation: MR brain and CT head unremarkable.                                                                                                        PROCEDURES:  Critical Care performed: No  Procedures   MEDICATIONS ORDERED IN ED: Medications  metoCLOPramide (REGLAN) injection 10 mg (10 mg Intravenous Given 06/03/22 1615)  diphenhydrAMINE (BENADRYL) injection 50 mg (50 mg Intravenous Given 06/03/22 1615)  LORazepam (ATIVAN) injection 1 mg (1 mg Intravenous Given 06/03/22 1917)     IMPRESSION / MDM / ASSESSMENT AND PLAN / ED COURSE  I reviewed the triage vital signs and the nursing notes.                              Assessment and plan:  Migraine:  58 year old female presents to the emergency department with an atypical headache for the past month.  Vital signs are reassuring at  triage.  On exam, patient was alert, active and nontoxic-appearing.  She endorsed worsening lightheadedness and right eye blurry vision over the past 2 weeks.  CBC and BMP reassuring.  CRP within range.  Urinalysis shows no signs of UTI.  CT head and MRI brain without contrast reassuring without acute abnormality.  Patient was given migraine cocktail and she reported that her headache resolved.  Recommended follow-up with primary care as needed.  All patient questions were answered.     FINAL CLINICAL IMPRESSION(S) / ED DIAGNOSES   Final diagnoses:  Other migraine without status migrainosus, not intractable     Rx / DC Orders   ED Discharge Orders     None        Note:  This document was prepared using Dragon voice recognition software and may include unintentional dictation errors.   Pia Mau [place], PA-C 06/03/22 2259    Corena Herter, MD 06/04/22 [phone]

## 2022-06-04 ENCOUNTER — Telehealth: Payer: Self-pay

## 2022-06-04 NOTE — Transitions of Care (Post Inpatient/ED Visit) (Signed)
   06/04/2022  Name: Jenitza Skelley MRN: [id number] DOB: 1965-05-29  Today's TOC FU Call Status: Today's TOC FU Call Status:: Unsuccessul Call (1st Attempt) Unsuccessful Call (1st Attempt) Date: 06/04/22  Attempted to reach the patient regarding the most recent Inpatient/ED visit.  Follow Up Plan: Additional outreach attempts will be made to reach the patient to complete the Transitions of Care (Post Inpatient/ED visit) call.   Signature Kandis Fantasia, LPN [facility] Health Advisor  l [facility] You Are. We Are. [employer] # [phone]

## 2022-06-06 NOTE — Transitions of Care (Post Inpatient/ED Visit) (Signed)
still having h/a; pt concerned might be BP elevated, some lightheadness on and off. Pt had FU appt with Mayra Reel NP on 06/11/22 at 9:20 AM; sooner appt was offered but pt could not take that appt due to work schedule.         06/06/2022  Name: Ashley [name] MRN: [id number] DOB: September 17, 1965  Today's TOC FU Call Status: Today's TOC FU Call Status:: Successful TOC FU Call Competed Unsuccessful Call (1st Attempt) Date: 06/04/22 [facility] FU Call Complete Date: 06/06/22  Transition Care Management Follow-up Telephone Call Date of Discharge: 06/03/22 Discharge Facility: [facility]) Type of Discharge: Emergency Department Reason for ED Visit: Other: (migraine) Neurologic Diagnosis:  (migraine) How have you been since you were released from the hospital?: Same (still having h/a; pt concerned might be BP elevated, some lightheadness on and off) Any questions or concerns?: Yes Patient Questions/Concerns:: still having h/a; pt concerned might be BP elevated, some lightheadness on and off Patient Questions/Concerns Addressed: Notified Provider of Patient Questions/Concerns  Items Reviewed: Did you receive and understand the discharge instructions provided?: Yes Medications obtained and verified?: Yes (Medications Reviewed) Any new allergies since your discharge?: No Dietary orders reviewed?: No Do you have support at home?: Yes People in Home: child(ren), adult Name of Support/Comfort Primary Source: Darrell  Home Care and Equipment/Supplies: Were Home Health Services Ordered?: No Any new equipment or medical supplies ordered?: No  Functional Questionnaire: Do you need assistance with bathing/showering or dressing?: No Do you need assistance with meal preparation?: No Do you need assistance with eating?: No Do you have difficulty maintaining continence: No Do you need assistance with getting out of bed/getting out of a chair/moving?: No Do you have  difficulty managing or taking your medications?: No  Follow up appointments reviewed: PCP Follow-up appointment confirmed?: Yes Date of PCP follow-up appointment?: 06/11/22 Follow-up Provider: Vernona Rieger NP [facility]ist [facility] Follow-up appointment confirmed?: NA Do you need transportation to your follow-up appointment?: No Do you understand care options if your condition(s) worsen?: Yes-patient verbalized understanding    SIGNATURE Lewanda Rife, LPN

## 2022-06-11 ENCOUNTER — Ambulatory Visit: Payer: Managed Care, Other (non HMO) | Admitting: Primary Care

## 2022-06-11 ENCOUNTER — Encounter: Payer: Self-pay | Admitting: Primary Care

## 2022-06-11 VITALS — BP 142/84 | HR 94 | Temp 98.6°F | Ht 66.0 in | Wt 175.0 lb

## 2022-06-11 DIAGNOSIS — I10 Essential (primary) hypertension: Secondary | ICD-10-CM | POA: Diagnosis not present

## 2022-06-11 DIAGNOSIS — R519 Headache, unspecified: Secondary | ICD-10-CM | POA: Insufficient documentation

## 2022-06-11 DIAGNOSIS — R1084 Generalized abdominal pain: Secondary | ICD-10-CM | POA: Diagnosis not present

## 2022-06-11 LAB — POC URINALSYSI DIPSTICK (AUTOMATED)
Bilirubin, UA: NEGATIVE
Blood, UA: NEGATIVE
Glucose, UA: NEGATIVE
Ketones, UA: NEGATIVE
Leukocytes, UA: NEGATIVE
Nitrite, UA: NEGATIVE
Protein, UA: POSITIVE — AB
Spec Grav, UA: 1.015 (ref 1.010–1.025)
Urobilinogen, UA: 0.2 E.U./dL
pH, UA: 6 (ref 5.0–8.0)

## 2022-06-11 MED ORDER — AMLODIPINE BESYLATE 5 MG PO TABS
10.0000 mg | ORAL_TABLET | Freq: Every day | ORAL | 0 refills | Status: DC
Start: 2022-06-11 — End: 2022-06-27

## 2022-06-11 MED ORDER — AMOXICILLIN-POT CLAVULANATE 875-125 MG PO TABS
1.0000 | ORAL_TABLET | Freq: Two times a day (BID) | ORAL | 0 refills | Status: DC
Start: 2022-06-11 — End: 2022-06-17

## 2022-06-11 NOTE — Assessment & Plan Note (Signed)
Above goal today, could also be the cause for her headaches.  Increase Amlodipine to 10 mg. She has plenty of the 5 mg tablets at home and will take 2 tablets daily.  Follow up in 2 weeks.

## 2022-06-11 NOTE — Progress Notes (Signed)
Subjective:    Patient ID: Ashley [name], female    DOB: [birth year], 57 y.o.   MRN: [id number]  Abdominal Pain Associated symptoms include headaches and nausea. Pertinent negatives include no constipation, diarrhea, dysuria, fever, hematuria or vomiting.    Ashley [name] is a very pleasant 57 y.o. female with a history of hypertension, GERD, osteoarthritis, hyperlipidemia, prediabetes, anxiety and depression, dizziness chronic joint pain who presents today for ED follow up and to discuss migraines and abdominal pain.   1) Migraines/Headaches:   She presented to [facility] ED on 06/03/22 with an intermittent headache x 1 month with worsening symptoms over the prior 2 weeks. Also with lightheadedness, right sided blurred vision. During her stay in the ED labs including CRP, CBC, BMP were negative. No evidence of cystitis. CT head and MRI brain were without acute abnormality. She was treated with Reglan, Benadryl, lorazepam, and IV fluids. She was discharged home later that day.  Since her discharge she continues to experience daily headaches which are located to the right temporal region and moves to the right frontal lobe and right occipital lobe. She does experience sensitivity to light with headaches.   She has no prior history of migraines and no family history of migraines. She is compliant to her amlodipine 5 mg daily. Her last eye exam was within the last year.   BP Readings from Last 3 Encounters:  06/11/22 (!) 142/84  06/03/22 (!) 145/96  05/14/22 (!) 151/92    2) Abdominal Pain: Acute onset one month ago. Pain is located to the bilateral upper and lower abdomen with radiation to the mid abdomen. She describes her pain as cramping, feels like it did when she had diverticulitis last year. She's also noticed intermittent nausea without vomiting.   Yesterday she noticed urinary frequency. She denies dysuria, constipation, diarrhea, bloody stools, hematuria, fevers.     Review of Systems  Constitutional:  Negative for fever.  Eyes:  Positive for photophobia.  Gastrointestinal:  Positive for abdominal pain and nausea. Negative for constipation, diarrhea and vomiting.  Genitourinary:  Negative for dysuria and hematuria.  Neurological:  Positive for headaches.         Past Medical History:  Diagnosis Date   Abdominal pain 06/23/2018   Acute breast pain 01/30/2022   Acute midline low back pain without sciatica 09/10/2019   Acute right flank pain 06/29/2021   Adverse effect of drug 08/15/2019   Anemia    Bronchitis    2 years ago    [facility] arthritis 2019   base of left thumb    Constipation 04/07/2019   Discoloration of skin 05/17/2021   Diverticulitis of colon    GERD (gastroesophageal reflux disease)    diet controlled , no meds   Hydrosalpinx    bilateral    Hyperlipidemia    recent dx - currently diet control - no meds   Hypertension    Hypothyroid 2003   radio active iodine - no meds   Left-sided weakness 02/12/2019   Ovarian cyst, left 02/23/2016   Urinary frequency 11/30/2018   Vitamin B 12 deficiency     Social History   Socioeconomic History   Marital status: Single    Spouse name: Not on file   Number of children: Not on file   Years of education: Not on file   Highest education level: Not on file  Occupational History   Not on file  Tobacco Use   Smoking status: Never   Smokeless tobacco:  Never  Vaping Use   Vaping Use: Never used  Substance and Sexual Activity   Alcohol use: No   Drug use: No   Sexual activity: Yes    Partners: Male    Birth control/protection: Surgical    Comment: hysterectomy.  Other Topics Concern   Not on file  Social History Narrative   Single.   3 children.   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   Enjoys relaxing, exercise, walking her dog.    Social Determinants of Health   Financial Resource Strain: Not on file  Food Insecurity: Not on file  Transportation Needs: Not on file   Physical Activity: Not on file  Stress: Not on file  Social Connections: Not on file  Intimate Partner Violence: Not on file    Past Surgical History:  Procedure Laterality Date   CESAREAN SECTION     x 2   CHOLECYSTECTOMY  2008   COLONOSCOPY WITH PROPOFOL N/A 08/06/2021   Procedure: COLONOSCOPY WITH PROPOFOL;  Surgeon: Midge Minium, MD;  Location[facility];  Service: Endoscopy;  Laterality: N/A;   DILATION AND CURETTAGE OF UTERUS     X 2   ESOPHAGOGASTRODUODENOSCOPY (EGD) WITH PROPOFOL N/A 08/06/2021   Procedure: ESOPHAGOGASTRODUODENOSCOPY (EGD) WITH PROPOFOL;  Surgeon: Midge Minium, MD;  Location: [facility];  Service: Endoscopy;  Laterality: N/A;   LAPAROSCOPIC BILATERAL SALPINGECTOMY Bilateral 07/29/2017   Procedure: LAPAROSCOPIC LEFT SALPINGOOPHRECTOMY: Partial right Salpingectomy;  Surgeon: Willodean Rosenthal, MD;  Location: [facility];  Service: Gynecology;  Laterality: Bilateral;   POLYPECTOMY  08/06/2021   Procedure: POLYPECTOMY;  Surgeon: Midge Minium, MD;  Location: [facility];  Service: Endoscopy;;   SHOULDER SURGERY  2016   arthroscopic   TOTAL ABDOMINAL HYSTERECTOMY  2006   Fibroids, benign pathology   TUBAL LIGATION     WISDOM TOOTH EXTRACTION     at age 38 yr   WRIST ARTHROSCOPY Right 2007    Family History  Problem Relation Age of Onset   Hypertension Mother    Ovarian cancer Mother    Other Father        unknown medical history   Hypertension Maternal Grandmother    Heart failure Maternal Grandmother    Lung cancer Maternal Grandmother    Breast cancer Maternal Aunt    Colon cancer Neg Hx    Colon polyps Neg Hx    Rectal cancer Neg Hx    Stomach cancer Neg Hx     Allergies  Allergen Reactions   Aczone [Dapsone] Swelling   Levaquin [Levofloxacin]     Presumed cause of joint aches    Current Outpatient Medications on File Prior to Visit  Medication Sig Dispense Refill   aspirin 81 MG EC tablet  Take by mouth.     azelastine (ASTELIN) 0.1 % nasal spray Place into the nose.     fluticasone (FLONASE) 50 MCG/ACT nasal spray Place 1 spray into both nostrils 2 (two) times daily. 16 g 2   gabapentin (NEURONTIN) 600 MG tablet Take 1,200 mg by mouth daily.     pantoprazole (PROTONIX) 40 MG tablet Take 1 tablet (40 mg total) by mouth 2 (two) times daily. 60 tablet 1   venlafaxine XR (EFFEXOR-XR) 75 MG 24 hr capsule Take 1 capsule (75 mg total) by mouth daily. 60 capsule 3   atorvastatin (LIPITOR) 10 MG tablet Take 1 tablet (10 mg total) by mouth daily. for cholesterol. (Patient not taking: Reported on 06/11/2022) 90 tablet 3  No current facility-administered medications on file prior to visit.    BP (!) 142/84   Pulse 94   Temp 98.6 F (37 C) (Temporal)   Ht 5\' 6"  (1.676 m)   Wt 175 lb (79.4 kg)   SpO2 100%   BMI 28.25 kg/m  Objective:   Physical Exam Constitutional:      General: She is not in acute distress. Cardiovascular:     Rate and Rhythm: Normal rate and regular rhythm.  Pulmonary:     Effort: Pulmonary effort is normal.     Breath sounds: Normal breath sounds.  Abdominal:     Tenderness: There is abdominal tenderness in the right upper quadrant, right lower quadrant and periumbilical area.  Musculoskeletal:     Cervical back: Neck supple.  Skin:    General: Skin is warm and dry.           Assessment & Plan:  Generalized abdominal pain Assessment & Plan: No alarm signs, but given prior history of diverticulitis, coupled with presentation, acute diverticulitis cannot be excluded.  Start Augmentin antibiotics. Take 1 tablet by mouth twice daily for 10 days.  Discussed clear liquid/bland diet.  Follow up in 2 weeks.  UA today negative.   Orders: -     POCT Urinalysis Dipstick (Automated) -     Amoxicillin-Pot Clavulanate; Take 1 tablet by mouth 2 (two) times daily.  Dispense: 20 tablet; Refill: 0  Essential hypertension Assessment & Plan: Above goal  today, could also be the cause for her headaches.  Increase Amlodipine to 10 mg. She has plenty of the 5 mg tablets at home and will take 2 tablets daily.  Follow up in 2 weeks.   Frequent headaches Assessment & Plan: Recent ED visit. Reviewed ED notes, labs, imaging.  Suspect hypertension could be contributing/causing.  Increase Amlodipine to 10 mg daily.   Follow up in 2 weeks   HTN (hypertension), malignant -     amLODIPine Besylate; Take 2 tablets (10 mg total) by mouth daily. for blood pressure.  Dispense: 180 tablet; Refill: 0        Doreene Nest, NP

## 2022-06-11 NOTE — Assessment & Plan Note (Signed)
No alarm signs, but given prior history of diverticulitis, coupled with presentation, acute diverticulitis cannot be excluded.  Start Augmentin antibiotics. Take 1 tablet by mouth twice daily for 10 days.  Discussed clear liquid/bland diet.  Follow up in 2 weeks.  UA today negative.

## 2022-06-11 NOTE — Assessment & Plan Note (Signed)
Recent ED visit. Reviewed ED notes, labs, imaging.  Suspect hypertension could be contributing/causing.  Increase Amlodipine to 10 mg daily.   Follow up in 2 weeks

## 2022-06-11 NOTE — Patient Instructions (Signed)
Start Augmentin antibiotics. Take 1 tablet by mouth twice daily for 10 days. Move to a clear liquid diet/bland diet.  Increase your amlodipine to two tablets (10 mg daily) for blood pressure.  Please schedule a follow up visit to meet back with me in 2-3 weeks for blood pressure check.   It was a pleasure to see you today!

## 2022-06-17 ENCOUNTER — Other Ambulatory Visit: Payer: Self-pay

## 2022-06-17 ENCOUNTER — Emergency Department: Payer: Managed Care, Other (non HMO)

## 2022-06-17 ENCOUNTER — Emergency Department
Admission: EM | Admit: 2022-06-17 | Discharge: 2022-06-17 | Disposition: A | Discharge status: Home / Self Care | Payer: Managed Care, Other (non HMO) | Attending: Emergency Medicine | Admitting: Emergency Medicine

## 2022-06-17 DIAGNOSIS — I10 Essential (primary) hypertension: Secondary | ICD-10-CM | POA: Insufficient documentation

## 2022-06-17 DIAGNOSIS — K5792 Diverticulitis of intestine, part unspecified, without perforation or abscess without bleeding: Secondary | ICD-10-CM

## 2022-06-17 DIAGNOSIS — E039 Hypothyroidism, unspecified: Secondary | ICD-10-CM | POA: Diagnosis not present

## 2022-06-17 DIAGNOSIS — R1031 Right lower quadrant pain: Secondary | ICD-10-CM | POA: Diagnosis present

## 2022-06-17 DIAGNOSIS — K5732 Diverticulitis of large intestine without perforation or abscess without bleeding: Secondary | ICD-10-CM | POA: Insufficient documentation

## 2022-06-17 LAB — COMPREHENSIVE METABOLIC PANEL
ALT: 15 U/L (ref 0–44)
AST: 26 U/L (ref 15–41)
Albumin: 4.1 g/dL (ref 3.5–5.0)
Alkaline Phosphatase: 82 U/L (ref 38–126)
Anion gap: 10 (ref 5–15)
BUN: 12 mg/dL (ref 6–20)
CO2: 23 mmol/L (ref 22–32)
Calcium: 10 mg/dL (ref 8.9–10.3)
Chloride: 105 mmol/L (ref 98–111)
Creatinine, Ser: 0.67 mg/dL (ref 0.44–1.00)
GFR, Estimated: >60 mL/min (ref >60)
Glucose, Bld: 89 mg/dL (ref 70–99)
Potassium: 4 mmol/L (ref 3.5–5.1)
Sodium: 138 mmol/L (ref 135–145)
Total Bilirubin: 0.6 mg/dL (ref 0.3–1.2)
Total Protein: 8 g/dL (ref 6.5–8.1)

## 2022-06-17 LAB — URINALYSIS, ROUTINE W REFLEX MICROSCOPIC
Bilirubin Urine: NEGATIVE
Glucose, UA: NEGATIVE mg/dL
Hgb urine dipstick: NEGATIVE
Ketones, ur: NEGATIVE mg/dL
Leukocytes,Ua: NEGATIVE
Nitrite: NEGATIVE
Protein, ur: NEGATIVE mg/dL
Specific Gravity, Urine: 1.019 (ref 1.005–1.030)
pH: 5 (ref 5.0–8.0)

## 2022-06-17 LAB — CBC
HCT: 43.3 % (ref 36.0–46.0)
Hemoglobin: 13.9 g/dL (ref 12.0–15.0)
MCH: 27.2 pg (ref 26.0–34.0)
MCHC: 32.1 g/dL (ref 30.0–36.0)
MCV: 84.7 fL (ref 80.0–100.0)
Platelets: 294 10*3/uL (ref 150–400)
RBC: 5.11 MIL/uL (ref 3.87–5.11)
RDW: 15.1 % (ref 11.5–15.5)
WBC: 4.3 10*3/uL (ref 4.0–10.5)
nRBC: 0 % (ref 0.0–0.2)

## 2022-06-17 LAB — LIPASE, BLOOD: Lipase: 32 U/L (ref 11–51)

## 2022-06-17 MED ORDER — CIPROFLOXACIN HCL 500 MG PO TABS
500.0000 mg | ORAL_TABLET | Freq: Once | ORAL | Status: AC
  Administered 2022-06-17: 500 mg via ORAL
  Filled 2022-06-17: qty 1

## 2022-06-17 MED ORDER — METRONIDAZOLE 500 MG PO TABS: 500.0000 mg | ORAL_TABLET | Freq: Three times a day (TID) | ORAL | 0 refills | Status: AC

## 2022-06-17 MED ORDER — CIPROFLOXACIN HCL 500 MG PO TABS: 500.0000 mg | ORAL_TABLET | Freq: Two times a day (BID) | ORAL | 0 refills | Status: AC

## 2022-06-17 MED ORDER — METRONIDAZOLE 500 MG PO TABS
500.0000 mg | ORAL_TABLET | Freq: Once | ORAL | Status: AC
  Administered 2022-06-17: 500 mg via ORAL
  Filled 2022-06-17: qty 1

## 2022-06-17 NOTE — ED Triage Notes (Signed)
Brought over from [facility]. Pt c/o right flank pain.  Diagnosed recently from PCP with diverticulitis and given antibiotics

## 2022-06-17 NOTE — ED Triage Notes (Signed)
Pt presents to ED from [facility] C/O RLQ pain X 4 days. Reports that the pain began in mid lower abdomen but has not moved to the RLQ.

## 2022-06-17 NOTE — ED Provider Notes (Signed)
[facility] Provider Note    Event Date/Time   First MD Initiated Contact with Patient 06/17/22 1644     (approximate)   History   Abdominal Pain   HPI  Ashley [name] is a 57 y.o. female with history of hyperlipidemia, hypothyroid, GERD, anemia, hypertension, diverticulitis and as listed in EMR presents to the emergency department for treatment and evaluation of right lower quadrant abdominal pain that intermittently radiates into her back.  She is currently being treated with Augmentin for abdominal pain that was felt to be related to diverticulitis.  She states that typically she does not have the back pain associated with flares of diverticulitis.  She denies noting blood in her urine or stool.  No known fever.  No history of kidney stone.  No dysuria or urinary frequency..      Physical Exam   Triage Vital Signs: ED Triage Vitals [06/17/22 1550]  Enc Vitals Group     BP (!) 155/87     Pulse Rate 97     Resp 14     Temp 98.1 F (36.7 C)     Temp Source Oral     SpO2 99 %     Weight 175 lb (79.4 kg)     Height  (1.676 m)     Head Circumference      Peak Flow      Pain Score 8     Pain Loc      Pain Edu?      Excl. in GC?     Most recent vital signs: Vitals:   06/17/22 1550  BP: (!) 155/87  Pulse: 97  Resp: 14  Temp: 98.1 F (36.7 C)  SpO2: 99%    General: Awake, no distress.  CV:  Good peripheral perfusion.  Resp:  Normal effort.  Abd:  No distention.  No rebound tenderness. Other:  No CVA tenderness.   ED Results / Procedures / Treatments   Labs (all labs ordered are listed, but only abnormal results are displayed) Labs Reviewed  URINALYSIS, ROUTINE W REFLEX MICROSCOPIC - Abnormal; Notable for the following components:      Result Value   Color, Urine YELLOW (*)    APPearance HAZY (*)    All other components within normal limits  LIPASE, BLOOD  COMPREHENSIVE METABOLIC PANEL  CBC     EKG  Not  indicated   RADIOLOGY  Image and radiology report reviewed and interpreted by me. Radiology report consistent with the same.  CT image renal stone study shows acute, uncomplicated diverticulitis in the mid sigmoid colon.  PROCEDURES:  Critical Care performed: No  Procedures   MEDICATIONS ORDERED IN ED:  Medications  ciprofloxacin (CIPRO) tablet 500 mg (500 mg Oral Given 06/17/22 1935)  metroNIDAZOLE (FLAGYL) tablet 500 mg (500 mg Oral Given 06/17/22 1935)     IMPRESSION / MDM / ASSESSMENT AND PLAN / ED COURSE   I have reviewed the triage note.  Differential diagnosis includes, but is not limited to, pyelonephritis, acute cystitis, kidney stone, diverticulitis, colitis  Patient's presentation is most consistent with acute complicated illness / injury requiring diagnostic workup.  57 year old female presenting to the emergency department for treatment and evaluation of abdominal pain.  See HPI for further details.  Lab studies and urinalysis are reassuring.  Plan will be to get a CT to rule out kidney stone.  Patient aware and agreeable to the plan.  CT shows uncomplicated diverticulitis in the mid sigmoid  colon.  Results reviewed with the patient.  Plan will be to change antibiotics to Cipro and Flagyl.  She states that she has done well on these in the past and feel that they work better than the Augmentin she has been taking.  She was encouraged to follow-up with her primary care provider or GI specialist if symptoms or not improving over the next few days.  If symptoms change or worsen she is unable to schedule appointment, she is to return to the emergency department.      FINAL CLINICAL IMPRESSION(S) / ED DIAGNOSES   Final diagnoses:  Diverticulitis     Rx / DC Orders   ED Discharge Orders          Ordered    ciprofloxacin (CIPRO) 500 MG tablet  2 times daily        06/17/22 1928    metroNIDAZOLE (FLAGYL) 500 MG tablet  3 times daily        06/17/22 1928              Note:  This document was prepared using Dragon voice recognition software and may include unintentional dictation errors.   Chinita Pester, FNP 06/17/22 2144    Sharman Cheek, MD 06/17/22 2230

## 2022-06-18 ENCOUNTER — Telehealth: Payer: Self-pay | Admitting: Primary Care

## 2022-06-18 ENCOUNTER — Telehealth: Payer: Self-pay

## 2022-06-18 NOTE — Telephone Encounter (Signed)
Patient returned call to our office, likely a f/u from ed visit. Would like a call back whenever possible. Please advise [phone]. Routed to caller and pcp pool.

## 2022-06-18 NOTE — Transitions of Care (Post Inpatient/ED Visit) (Signed)
   06/18/2022  Name: Madelina Sanda MRN: [id number] DOB: 30-Oct-1965  Today's TOC FU Call Status: Unsuccessful Call (1st Attempt) Date: 06/18/22  Attempted to reach the patient regarding the most recent Inpatient/ED visit.  Follow Up Plan: Additional outreach attempts will be made to reach the patient to complete the Transitions of Care (Post Inpatient/ED visit) call.   Signature Donnamarie Poag, CMA

## 2022-06-19 ENCOUNTER — Telehealth: Payer: Self-pay

## 2022-06-19 NOTE — Transitions of Care (Post Inpatient/ED Visit) (Signed)
   06/19/2022  Name: Ashley [name] MRN: [id number] DOB: September 16, 1965  Today's TOC FU Call Status: Today's TOC FU Call Status:: Successful TOC FU Call Competed Unsuccessful Call (1st Attempt) Date: 06/19/22  Transition Care Management Follow-up Telephone Call Date of Discharge: 06/17/22 Discharge Facility: [facility]) Type of Discharge: Emergency Department Reason for ED Visit: Other: (diverticulitis) How have you been since you were released from the hospital?: Same (been taking ibuprofen, but is not helping as much as she would like) Any questions or concerns?: No  Items Reviewed: Did you receive and understand the discharge instructions provided?: Yes Medications obtained and verified?: Yes (Medications Reviewed) (just picked them up from the pharmacy today) Any new allergies since your discharge?: No Dietary orders reviewed?: NA Do you have support at home?: Yes  Home Care and Equipment/Supplies: Were Home Health Services Ordered?: NA Any new equipment or medical supplies ordered?: NA  Functional Questionnaire: Do you need assistance with bathing/showering or dressing?: No Do you need assistance with meal preparation?: No Do you need assistance with eating?: No Do you have difficulty maintaining continence: No Do you need assistance with getting out of bed/getting out of a chair/moving?: No Do you have difficulty managing or taking your medications?: No  Follow up appointments reviewed: PCP Follow-up appointment confirmed?: No (patient has an appointment already scheduled for 4/30) Specialist Hospital Follow-up appointment confirmed?: NA Do you need transportation to your follow-up appointment?: No Do you understand care options if your condition(s) worsen?: Yes-patient verbalized understanding    SIGNATURE Elisha Ponder LPN [facility] AWV Team Direct dial:  [phone]

## 2022-06-19 NOTE — Transitions of Care (Post Inpatient/ED Visit) (Signed)
I spoke with pt and she is about the same as when seen in ED on 06/17/22. Pt just picked  up cipro and metronidazole this morning and will take meds as prescribed. Pt already has appt with Allayne Gitelman NP on 06/25/22 for BP FU and pt will keep that appt unless condition changes or worsens and then pt will contact [facility]. UC & ED precautions given and pt voiced understanding. Sending note to Allayne Gitelman NP.      06/19/2022  Name: Ashley [name] MRN: [id number] DOB: 15-Jan-1966  Today's TOC FU Call Status: Unsuccessful Call (1st Attempt) Date: 06/18/22 [facility] FU Call Complete Date: 06/19/22  Transition Care Management Follow-up Telephone Call Date of Discharge: 06/17/22 Discharge Facility: [facility]) Name of Other (Non[facility]) Discharge Facility: [facility] Type of Discharge: Emergency Department Reason for ED Visit: Other: (abd pain and diverticulitis) How have you been since you were released from the hospital?: Same (just picked up med today and has started cipro and metronidazole.) Any questions or concerns?: No  Items Reviewed: Did you receive and understand the discharge instructions provided?: Yes Medications obtained and verified?: Yes (Medications Reviewed) (pt just picked up cipro and metronidazole from pharmacy this morning and pt will take meds as prescribed.) Any new allergies since your discharge?: No Dietary orders reviewed?: Yes Type of Diet Ordered:: try to stay away from fried foods, spicy or greasy foods Do you have support at home?: Yes People in Home: child(ren), adult Name of Support/Comfort Primary Source: [facility] and Equipment/Supplies: Were Home Health Services Ordered?: NA Any new equipment or medical supplies ordered?: NA  Functional Questionnaire: Do you need assistance with bathing/showering or dressing?: No Do you need assistance with meal preparation?: No Do you need assistance with eating?: No Do you have  difficulty maintaining continence: No Do you need assistance with getting out of bed/getting out of a chair/moving?: No Do you have difficulty managing or taking your medications?: No  Follow up appointments reviewed: PCP Follow-up appointment confirmed?: Yes (pt already has appt scheduled for 06/25/22 at 10:20 with Allayne Gitelman NP for BP FU and pt will cb sooner if needed.) Date of PCP follow-up appointment?: 06/25/22 Follow-up Provider: Mayra Reel NP Specialist Hospital Follow-up appointment confirmed?: NA Do you need transportation to your follow-up appointment?: No Do you understand care options if your condition(s) worsen?: Yes-patient verbalized understanding    SIGNATURE Lewanda Rife, LPN

## 2022-06-21 NOTE — Telephone Encounter (Signed)
Per chart toc call completed on 4/24.  No further action needed at this time.

## 2022-06-25 ENCOUNTER — Ambulatory Visit: Payer: Managed Care, Other (non HMO) | Admitting: Obstetrics & Gynecology

## 2022-06-25 ENCOUNTER — Ambulatory Visit: Payer: Managed Care, Other (non HMO) | Admitting: Primary Care

## 2022-06-27 ENCOUNTER — Ambulatory Visit: Payer: Managed Care, Other (non HMO) | Admitting: Primary Care

## 2022-06-27 ENCOUNTER — Encounter: Payer: Self-pay | Admitting: Primary Care

## 2022-06-27 VITALS — BP 120/76 | HR 87 | Temp 97.6°F | Ht 66.0 in | Wt 177.0 lb

## 2022-06-27 DIAGNOSIS — I10 Essential (primary) hypertension: Secondary | ICD-10-CM | POA: Diagnosis not present

## 2022-06-27 DIAGNOSIS — Z8719 Personal history of other diseases of the digestive system: Secondary | ICD-10-CM | POA: Diagnosis not present

## 2022-06-27 DIAGNOSIS — R519 Headache, unspecified: Secondary | ICD-10-CM | POA: Diagnosis not present

## 2022-06-27 MED ORDER — AMLODIPINE BESYLATE 10 MG PO TABS
10.0000 mg | ORAL_TABLET | Freq: Every day | ORAL | 0 refills | Status: DC
Start: 2022-06-27 — End: 2022-08-19

## 2022-06-27 NOTE — Assessment & Plan Note (Signed)
Improved and at goal!  Continue amlodipine 10 mg daily. New Rx sent to pharmacy

## 2022-06-27 NOTE — Assessment & Plan Note (Signed)
Improving.  Continue amlodipine 10 mg daily. Continue to work with physiatry.

## 2022-06-27 NOTE — Patient Instructions (Signed)
You will either be contacted via phone regarding your referral to GI, or you may receive a letter on your MyChart portal from our referral team with instructions for scheduling an appointment. Please let us know if you have not been contacted by anyone within two weeks.  Continue taking 10 mg of amlodipine daily for blood pressure.  Have your HR department send paperwork for intermittent FMLA.  It was a pleasure to see you today!

## 2022-06-27 NOTE — Assessment & Plan Note (Addendum)
Recent ED visit. Labs, imaging, notes reviewed.  Exam today stable. Continue to advance diet as tolerated.  Referral placed to GI.  Agree to provided intermittent FMLA for her prior and ongoing doctors appointments.  She will need 1 day per week lasting 8 hours per event.  She will contact her HR department.

## 2022-06-27 NOTE — Progress Notes (Signed)
Subjective:    Patient ID: Ashley [name], female    DOB: Feb 25, 1966, 57 y.o.   MRN: [id number]  HPI  Ashley [name] is a very pleasant 57 y.o. female with a history of hypertension, GERD, neuropathy, osteoarthritis, diverticulitis, prediabetes, frequent headaches who presents today for follow-up of hypertension and ED follow-up of diverticulitis.  1) Hypertension: Currently managed on amlodipine 10 mg daily.  She was last evaluated on 06/11/2022 for ED follow-up of headaches, and during this visit her blood pressure was noted to be elevated during several prior visits.  Given this her amlodipine was increased from 5 mg daily to 10 mg daily.  Since her last visit with [place] she is compliant to amlodipine 10 mg (taking 2 of her 5 mg tablets). Her headaches have improved but she continues to notice some. She follows with physiatry who suggests that her headaches could be secondary to her cervical arthritis.   BP Readings from Last 3 Encounters:  06/27/22 120/76  06/17/22 (!) 155/87  06/11/22 (!) 142/84     2) Diverticulitis: Evaluated in our clinic on 06/11/2022 for generalized abdominal pain that was suspicious for acute diverticulitis.  Given her presentation and symptoms we initiated Augmentin twice daily x 10 days and advised clear liquid diet.  She presented to [facility] ED on 06/17/2022 for continued right lower quadrant abdominal pain with radiation to her back despite compliance to Augmentin.  She underwent CT abdomen/pelvis which revealed uncomplicated diverticulitis of the mid sigmoid colon.  Augmentin was discontinued and she was transition to Cipro and Flagyl orally.  She was discharged home later that day.  Since her ED visit she has completed her course of Flagyl and Cipro. She continues to experience right lower and upper abdominal pain which is much better overall. She has noticed some constipation. She is advancing her diet as tolerated.   She denies nausea, vomiting,  bloody stools. She's had two diverticulitis flares in total. She has not seen GI in at least one year.   She is needing intermittent FMLA for her absences from work and ongoing doctors appointments. She is out of work at least once weekly for appointments and has used up a lot of her vacation time and sick leave.    Review of Systems  Gastrointestinal:  Positive for abdominal pain and constipation. Negative for blood in stool, diarrhea, nausea and vomiting.  Musculoskeletal:  Positive for arthralgias.  Neurological:  Positive for headaches.         Past Medical History:  Diagnosis Date   Abdominal pain 06/23/2018   Acute breast pain 01/30/2022   Acute midline low back pain without sciatica 09/10/2019   Acute right flank pain 06/29/2021   Adverse effect of drug 08/15/2019   Anemia    Bronchitis    2 years ago    [facility] arthritis 2019   base of left thumb    Constipation 04/07/2019   Discoloration of skin 05/17/2021   Diverticulitis of colon    GERD (gastroesophageal reflux disease)    diet controlled , no meds   Hydrosalpinx    bilateral    Hyperlipidemia    recent dx - currently diet control - no meds   Hypertension    Hypothyroid 2003   radio active iodine - no meds   Left-sided weakness 02/12/2019   Ovarian cyst, left 02/23/2016   Urinary frequency 11/30/2018   Vitamin B 12 deficiency     Social History   Socioeconomic History   Marital  status: Single    Spouse name: Not on file   Number of children: Not on file   Years of education: Not on file   Highest education level: Not on file  Occupational History   Not on file  Tobacco Use   Smoking status: Never   Smokeless tobacco: Never  Vaping Use   Vaping Use: Never used  Substance and Sexual Activity   Alcohol use: No   Drug use: No   Sexual activity: Yes    Partners: Male    Birth control/protection: Surgical    Comment: hysterectomy.  Other Topics Concern   Not on file  Social History Narrative    Single.   3 children.   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   Enjoys relaxing, exercise, walking her dog.    Social Determinants of Health   Financial Resource Strain: Not on file  Food Insecurity: Not on file  Transportation Needs: Not on file  Physical Activity: Not on file  Stress: Not on file  Social Connections: Not on file  Intimate Partner Violence: Not on file    Past Surgical History:  Procedure Laterality Date   CESAREAN SECTION     x 2   CHOLECYSTECTOMY  2008   COLONOSCOPY WITH PROPOFOL N/A 08/06/2021   Procedure: COLONOSCOPY WITH PROPOFOL;  Surgeon: Midge Minium, MD;  Location[facility];  Service: Endoscopy;  Laterality: N/A;   DILATION AND CURETTAGE OF UTERUS     X 2   ESOPHAGOGASTRODUODENOSCOPY (EGD) WITH PROPOFOL N/A 08/06/2021   Procedure: ESOPHAGOGASTRODUODENOSCOPY (EGD) WITH PROPOFOL;  Surgeon: Midge Minium, MD;  Location: [facility];  Service: Endoscopy;  Laterality: N/A;   LAPAROSCOPIC BILATERAL SALPINGECTOMY Bilateral 07/29/2017   Procedure: LAPAROSCOPIC LEFT SALPINGOOPHRECTOMY: Partial right Salpingectomy;  Surgeon: Willodean Rosenthal, MD;  Location: [facility];  Service: Gynecology;  Laterality: Bilateral;   POLYPECTOMY  08/06/2021   Procedure: POLYPECTOMY;  Surgeon: Midge Minium, MD;  Location: [facility];  Service: Endoscopy;;   SHOULDER SURGERY  2016   arthroscopic   TOTAL ABDOMINAL HYSTERECTOMY  2006   Fibroids, benign pathology   TUBAL LIGATION     WISDOM TOOTH EXTRACTION     at age 59 yr   WRIST ARTHROSCOPY Right 2007    Family History  Problem Relation Age of Onset   Hypertension Mother    Ovarian cancer Mother    Other Father        unknown medical history   Hypertension Maternal Grandmother    Heart failure Maternal Grandmother    Lung cancer Maternal Grandmother    Breast cancer Maternal Aunt    Colon cancer Neg Hx    Colon polyps Neg Hx    Rectal cancer Neg Hx    Stomach cancer Neg Hx      Allergies  Allergen Reactions   Aczone [Dapsone] Swelling   Levaquin [Levofloxacin]     Presumed cause of joint aches    Current Outpatient Medications on File Prior to Visit  Medication Sig Dispense Refill   atorvastatin (LIPITOR) 10 MG tablet Take 1 tablet (10 mg total) by mouth daily. for cholesterol. 90 tablet 3   azelastine (ASTELIN) 0.1 % nasal spray Place into the nose.     fluticasone (FLONASE) 50 MCG/ACT nasal spray Place 1 spray into both nostrils 2 (two) times daily. 16 g 2   gabapentin (NEURONTIN) 600 MG tablet Take 1,200 mg by mouth daily.     pantoprazole (PROTONIX) 40 MG tablet Take  1 tablet (40 mg total) by mouth 2 (two) times daily. 60 tablet 1   venlafaxine XR (EFFEXOR-XR) 75 MG 24 hr capsule Take 1 capsule (75 mg total) by mouth daily. 60 capsule 3   aspirin 81 MG EC tablet Take by mouth.     No current facility-administered medications on file prior to visit.    BP 120/76   Pulse 87   Temp 97.6 F (36.4 C) (Temporal)   Ht 5\' 6"  (1.676 m)   Wt 177 lb (80.3 kg)   SpO2 98%   BMI 28.57 kg/m  Objective:   Physical Exam Constitutional:      General: She is not in acute distress.    Appearance: She is not ill-appearing.  Cardiovascular:     Rate and Rhythm: Normal rate and regular rhythm.  Pulmonary:     Effort: Pulmonary effort is normal.     Breath sounds: Normal breath sounds.  Abdominal:     General: Bowel sounds are normal.     Palpations: Abdomen is soft.     Tenderness: There is abdominal tenderness in the right lower quadrant and suprapubic area. There is no guarding.  Musculoskeletal:     Cervical back: Neck supple.  Skin:    General: Skin is warm and dry.           Assessment & Plan:  Essential hypertension Assessment & Plan: Improved and at goal!  Continue amlodipine 10 mg daily. New Rx sent to pharmacy  Orders: -     amLODIPine Besylate; Take 1 tablet (10 mg total) by mouth daily. for blood pressure.  Dispense: 90 tablet;  Refill: 0  History of diverticulitis of colon -     Ambulatory referral to Gastroenterology  History of diverticulitis Assessment & Plan: Recent ED visit. Labs, imaging, notes reviewed.  Exam today stable. Continue to advance diet as tolerated.  Referral placed to GI.  Agree to provided intermittent FMLA for her prior and ongoing doctors appointments.  She will need 1 day per week lasting 8 hours per event.  She will contact her HR department.   Frequent headaches Assessment & Plan: Improving.  Continue amlodipine 10 mg daily. Continue to work with physiatry.          Doreene Nest, NP

## 2022-07-01 DIAGNOSIS — B379 Candidiasis, unspecified: Secondary | ICD-10-CM

## 2022-07-02 MED ORDER — FLUCONAZOLE 150 MG PO TABS
150.0000 mg | ORAL_TABLET | Freq: Once | ORAL | 0 refills | Status: AC
Start: 2022-07-02 — End: 2022-07-02

## 2022-07-05 ENCOUNTER — Encounter: Payer: Self-pay | Admitting: Primary Care

## 2022-07-05 ENCOUNTER — Ambulatory Visit: Payer: Managed Care, Other (non HMO) | Admitting: Primary Care

## 2022-07-05 VITALS — BP 146/94 | HR 103 | Temp 99.0°F | Ht 66.0 in | Wt 177.0 lb

## 2022-07-05 DIAGNOSIS — R519 Headache, unspecified: Secondary | ICD-10-CM | POA: Diagnosis not present

## 2022-07-05 LAB — CBC
Platelets: 295 10*3/uL (ref 140–400)
RDW: 14.7 % (ref 11.0–15.0)
WBC: 4.2 10*3/uL (ref 3.8–10.8)

## 2022-07-05 MED ORDER — PREDNISONE 20 MG PO TABS
ORAL_TABLET | ORAL | 0 refills | Status: DC
Start: 2022-07-05 — End: 2022-07-12

## 2022-07-05 NOTE — Progress Notes (Signed)
Subjective:    Patient ID: Ashley [name], female    DOB: October 11, 1965, 57 y.o.   MRN: [id number]  HPI  Ashley [name] is a very pleasant 57 y.o. female with a history of hypertension, neuropathy, cervical radiculitis, foraminal stenosis of cervical region, prediabetes, TMJ who presents today to discuss recurrent headaches.   She was last evaluated on 06/27/22 for follow up of diverticulitis from an ED visit. During this visit she mentioned a 2 month history of recurrent headaches despite control of hypertension and an injection to the cervical spine for chronic neck pain. She had a pending appointment with her eye doctor for vision test.  Since her last visit she underwent her eye exam and was told that her vision was not causing her headaches. She underwent MRI brain in April 2024 for headaches which was negative for mass or other abnormality.   She has no prior history of migraines or recurrent headaches. She is compliant to amlodipine 10 mg daily.   Her headaches begin to the right temporal region with radiation to the right ear and occasionally with radiation to right occipital lobe. She has noticed right ear fullness, scalp tenderness, jaw pain, blurred vision. Her most recent headache began 3 days ago and has been persistent. Originally, her headaches were intermittent, but over the last few weeks her headaches have increased in frequency.   She's been taking Ibuprofen routinely with minimal improvement. Earlier this week her headache woke her up from sleep which was severe.   Her pain is worse with movement, especially with position changes. She denies photophobia and phonophobia, nausea.   BP Readings from Last 3 Encounters:  07/05/22 (!) 146/94  06/27/22 120/76  06/17/22 (!) 155/87       Review of Systems  Eyes:  Positive for visual disturbance. Negative for photophobia.  Gastrointestinal:  Negative for nausea.  Neurological:  Positive for headaches.          Past Medical History:  Diagnosis Date   Abdominal pain 06/23/2018   Acute breast pain 01/30/2022   Acute midline low back pain without sciatica 09/10/2019   Acute right flank pain 06/29/2021   Adverse effect of drug 08/15/2019   Anemia    Bronchitis    2 years ago    [facility] arthritis 2019   base of left thumb    Constipation 04/07/2019   Discoloration of skin 05/17/2021   Diverticulitis of colon    GERD (gastroesophageal reflux disease)    diet controlled , no meds   Hydrosalpinx    bilateral    Hyperlipidemia    recent dx - currently diet control - no meds   Hypertension    Hypothyroid 2003   radio active iodine - no meds   Left-sided weakness 02/12/2019   Ovarian cyst, left 02/23/2016   Urinary frequency 11/30/2018   Vitamin B 12 deficiency     Social History   Socioeconomic History   Marital status: Single    Spouse name: Not on file   Number of children: Not on file   Years of education: Not on file   Highest education level: Not on file  Occupational History   Not on file  Tobacco Use   Smoking status: Never   Smokeless tobacco: Never  Vaping Use   Vaping Use: Never used  Substance and Sexual Activity   Alcohol use: No   Drug use: No   Sexual activity: Yes    Partners: Male    Birth  control/protection: Surgical    Comment: hysterectomy.  Other Topics Concern   Not on file  Social History Narrative   Single.   3 children.   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   Enjoys relaxing, exercise, walking her dog.    Social Determinants of Health   Financial Resource Strain: Not on file  Food Insecurity: Not on file  Transportation Needs: Not on file  Physical Activity: Not on file  Stress: Not on file  Social Connections: Not on file  Intimate Partner Violence: Not on file    Past Surgical History:  Procedure Laterality Date   CESAREAN SECTION     x 2   CHOLECYSTECTOMY  2008   COLONOSCOPY WITH PROPOFOL N/A 08/06/2021   Procedure: COLONOSCOPY WITH  PROPOFOL;  Surgeon: Midge Minium, MD;  Location: [facility];  Service: Endoscopy;  Laterality: N/A;   DILATION AND CURETTAGE OF UTERUS     X 2   ESOPHAGOGASTRODUODENOSCOPY (EGD) WITH PROPOFOL N/A 08/06/2021   Procedure: ESOPHAGOGASTRODUODENOSCOPY (EGD) WITH PROPOFOL;  Surgeon: Midge Minium, MD;  Location: [facility];  Service: Endoscopy;  Laterality: N/A;   LAPAROSCOPIC BILATERAL SALPINGECTOMY Bilateral 07/29/2017   Procedure: LAPAROSCOPIC LEFT SALPINGOOPHRECTOMY: Partial right Salpingectomy;  Surgeon: Willodean Rosenthal, MD;  Location: [facility];  Service: Gynecology;  Laterality: Bilateral;   POLYPECTOMY  08/06/2021   Procedure: POLYPECTOMY;  Surgeon: Midge Minium, MD;  Location: [facility];  Service: Endoscopy;;   SHOULDER SURGERY  2016   arthroscopic   TOTAL ABDOMINAL HYSTERECTOMY  2006   Fibroids, benign pathology   TUBAL LIGATION     WISDOM TOOTH EXTRACTION     at age 14 yr   WRIST ARTHROSCOPY Right 2007    Family History  Problem Relation Age of Onset   Hypertension Mother    Ovarian cancer Mother    Other Father        unknown medical history   Hypertension Maternal Grandmother    Heart failure Maternal Grandmother    Lung cancer Maternal Grandmother    Breast cancer Maternal Aunt    Colon cancer Neg Hx    Colon polyps Neg Hx    Rectal cancer Neg Hx    Stomach cancer Neg Hx     Allergies  Allergen Reactions   Aczone [Dapsone] Swelling   Levaquin [Levofloxacin]     Presumed cause of joint aches    Current Outpatient Medications on File Prior to Visit  Medication Sig Dispense Refill   amLODipine (NORVASC) 10 MG tablet Take 1 tablet (10 mg total) by mouth daily. for blood pressure. 90 tablet 0   aspirin 81 MG EC tablet Take by mouth.     atorvastatin (LIPITOR) 10 MG tablet Take 1 tablet (10 mg total) by mouth daily. for cholesterol. 90 tablet 3   azelastine (ASTELIN) 0.1 % nasal spray Place into the nose.      fluticasone (FLONASE) 50 MCG/ACT nasal spray Place 1 spray into both nostrils 2 (two) times daily. 16 g 2   gabapentin (NEURONTIN) 600 MG tablet Take 1,200 mg by mouth daily.     pantoprazole (PROTONIX) 40 MG tablet Take 1 tablet (40 mg total) by mouth 2 (two) times daily. 60 tablet 1   venlafaxine XR (EFFEXOR-XR) 75 MG 24 hr capsule Take 1 capsule (75 mg total) by mouth daily. 60 capsule 3   No current facility-administered medications on file prior to visit.    BP (!) 146/94   Pulse (!) 103  Temp 99 F (37.2 C) (Temporal)   Ht 5\' 6"  (1.676 m)   Wt 177 lb (80.3 kg)   SpO2 99%   BMI 28.57 kg/m  Objective:   Physical Exam HENT:     Right Ear: Tympanic membrane and ear canal normal.     Left Ear: Tympanic membrane and ear canal normal.  Eyes:     Extraocular Movements: Extraocular movements intact.  Cardiovascular:     Rate and Rhythm: Normal rate and regular rhythm.     Comments: Tenderness to right temporal region Pulmonary:     Effort: Pulmonary effort is normal.     Breath sounds: Normal breath sounds.  Musculoskeletal:     Cervical back: Neck supple.  Skin:    General: Skin is warm and dry.  Neurological:     Cranial Nerves: No cranial nerve deficit.     Sensory: No sensory deficit.           Assessment & Plan:  Frequent headaches Assessment & Plan: Suspicious for temporal arteritis.  MRI brain negative. Symptoms do not represent migraines. Optometry ruled out eye involvement. Neck pain has significantly improved with steroid injection. Headaches have persisted despite better blood pressure control.  CBC and sed rate ordered and pending. Will likely need referral to ENT for temporal artery biopsy. She does have an ENT provider.  Await results.  Start prednisone 60 mg daily x 3 days, then 40 mg x 3 days then 20 mg x 3 days.  May need to extend higher dose.  She will update early next week.  Orders: -     predniSONE; Take 3 tablets by mouth in the  morning for three days, then 2 tablets for three days, then 1 tablet for three days.  Dispense: 18 tablet; Refill: 0 -     Sedimentation rate -     CBC -     Comprehensive metabolic panel        Doreene Nest, NP

## 2022-07-05 NOTE — Assessment & Plan Note (Addendum)
Suspicious for temporal arteritis.  MRI brain negative. Symptoms do not represent migraines. Optometry ruled out eye involvement. Neck pain has significantly improved with steroid injection. Headaches have persisted despite better blood pressure control.  CBC and sed rate ordered and pending. Will likely need referral to ENT for temporal artery biopsy. She does have an ENT provider.  Await results.  Start prednisone 60 mg daily x 3 days, then 40 mg x 3 days then 20 mg x 3 days.  May need to extend higher dose.  She will update early next week.

## 2022-07-05 NOTE — Patient Instructions (Signed)
Stop by the lab prior to leaving today. I will notify you of your results once received.   Start prednisone. Take 3 tablets every morning for three days, then 2 tablets for three days, then 1 tablet for three days.  Please update me early next week!

## 2022-07-06 LAB — COMPREHENSIVE METABOLIC PANEL
AG Ratio: 1.4 (calc) (ref 1.0–2.5)
ALT: 14 U/L (ref 6–29)
AST: 22 U/L (ref 10–35)
Albumin: 4.3 g/dL (ref 3.6–5.1)
Alkaline phosphatase (APISO): 81 U/L (ref 37–153)
BUN: 12 mg/dL (ref 7–25)
CO2: 21 mmol/L (ref 20–32)
Calcium: 10.1 mg/dL (ref 8.6–10.4)
Chloride: 103 mmol/L (ref 98–110)
Creat: 0.77 mg/dL (ref 0.50–1.03)
Globulin: 3.1 g/dL (calc) (ref 1.9–3.7)
Glucose, Bld: 92 mg/dL (ref 65–99)
Potassium: 4.4 mmol/L (ref 3.5–5.3)
Sodium: 138 mmol/L (ref 135–146)
Total Bilirubin: 0.4 mg/dL (ref 0.2–1.2)
Total Protein: 7.4 g/dL (ref 6.1–8.1)

## 2022-07-06 LAB — CBC
HCT: 40.9 % (ref 35.0–45.0)
Hemoglobin: 13.5 g/dL (ref 11.7–15.5)
MCH: 27.5 pg (ref 27.0–33.0)
MCHC: 33 g/dL (ref 32.0–36.0)
MCV: 83.3 fL (ref 80.0–100.0)
MPV: 11.1 fL (ref 7.5–12.5)
RBC: 4.91 10*6/uL (ref 3.80–5.10)

## 2022-07-06 LAB — SEDIMENTATION RATE: Sed Rate: 6 mm/h (ref 0–30)

## 2022-07-08 ENCOUNTER — Ambulatory Visit: Payer: Managed Care, Other (non HMO) | Admitting: Physician Assistant

## 2022-07-08 ENCOUNTER — Encounter: Payer: Self-pay | Admitting: Physician Assistant

## 2022-07-08 VITALS — BP 149/93 | HR 87 | Temp 98.6°F | Ht 66.0 in | Wt 177.0 lb

## 2022-07-08 DIAGNOSIS — K219 Gastro-esophageal reflux disease without esophagitis: Secondary | ICD-10-CM | POA: Diagnosis not present

## 2022-07-08 DIAGNOSIS — K5732 Diverticulitis of large intestine without perforation or abscess without bleeding: Secondary | ICD-10-CM | POA: Diagnosis not present

## 2022-07-08 MED ORDER — CIPROFLOXACIN HCL 500 MG PO TABS: 500.0000 mg | ORAL_TABLET | Freq: Two times a day (BID) | ORAL | 0 refills | Status: AC

## 2022-07-08 MED ORDER — METRONIDAZOLE 500 MG PO TABS: 500.0000 mg | ORAL_TABLET | Freq: Two times a day (BID) | ORAL | 0 refills | Status: AC

## 2022-07-08 NOTE — Patient Instructions (Signed)
Diverticulitis  Diverticulitis happens when poop (stool) and bacteria get trapped in small pouches in the colon called diverticula. These pouches may form if you have a condition called diverticulosis. When the poop and bacteria get trapped, it can cause an infection and inflammation. Diverticulitis may cause severe stomach pain and diarrhea. It can also lead to tissue damage in your colon. This can cause bleeding or blockage. In some cases, the diverticula may burst (rupture). This can cause infected poop to go into other parts of your abdomen. What are the causes? This condition is caused by poop getting trapped in the diverticula. This allows bacteria to grow. It can lead to inflammation and infection. What increases the risk? You are more likely to get this condition if you have diverticulosis. You are also more at risk if: You are overweight or obese. You do not get enough exercise. You drink alcohol. You smoke. You eat a lot of red meat, such as beef, pork, or lamb. You do not get enough fiber. Foods high in fiber include fruits, vegetables, beans, nuts, and whole grains. You are over 40 years of age. What are the signs or symptoms? Symptoms of this condition may include: Pain and tenderness in the abdomen. This pain is often felt on the left side but may occur in other spots. Fever and chills. Nausea and vomiting. Cramping. Bloating. Changes in how often you poop. Blood in your poop. How is this diagnosed? This condition is diagnosed based on your medical history and a physical exam. You may also have tests done to make sure there is nothing else causing your condition. These tests may include: Blood tests. Tests done on your pee (urine). A CT scan of the abdomen. You may need to have a colonoscopy. This is an exam to look at your whole large intestine. During the exam, a tube is put into the opening of your butt (anus) and then moved into your rectum, colon, and other parts of  the large intestine. This exam is done to look at the diverticula. It can also see if there is something else that may be causing your symptoms. How is this treated? Most cases are mild and can be treated at home. You may be told to: Take over-the-counter pain medicine. Only eat and drink clear liquids. Take antibiotics. Rest. More severe cases may need to be treated at a hospital. Treatment may include: Not eating or drinking. Taking pain medicines. Getting antibiotics through an IV. Getting fluids and nutrition through an IV. Surgery. Follow these instructions at home: Medicines Take over-the-counter and prescription medicines only as told by your health care provider. These include fiber supplements, probiotics, and medicines to soften your poop (stool softeners). If you were prescribed antibiotics, take them as told by your provider. Do not stop using the antibiotic even if you start to feel better. Ask your provider if the medicine prescribed to you requires you to avoid driving or using machinery. Eating and drinking  Follow the diet told by your provider. You may need to only eat and drink liquids. After your symptoms get better, you may be able to return to a more normal diet. You may be told to eat at least 25 grams (25 g) of fiber each day. Fiber makes it easier to poop. Healthy sources of fiber include: Berries. One cup has 4-8 g of fiber. Beans or lentils. One-half cup has 5-8 g of fiber. Green vegetables. One cup has 4 g of fiber. Avoid eating red meat.   General instructions Do not use any products that contain nicotine or tobacco. These products include cigarettes, chewing tobacco, and vaping devices, such as e-cigarettes. If you need help quitting, ask your provider. Exercise for at least 30 minutes, 3 times a week. Exercise hard enough to raise your heart rate and break a sweat. Contact a health care provider if: Your pain gets worse. Your pooping does not go back to  normal. Your symptoms do not get better with treatment. Your symptoms get worse all of a sudden. You have a fever. You vomit more than one time. Your poop is bloody, black, or tarry. This information is not intended to replace advice given to you by your health care provider. Make sure you discuss any questions you have with your health care provider. Document Revised: 11/08/2021 Document Reviewed: 11/08/2021 Elsevier Patient Education  2023 Elsevier Inc.  

## 2022-07-08 NOTE — Progress Notes (Signed)
Celso Amy, PA-C [address]  [address]  [place], [place] [address]  Main: [phone]  Fax: [phone]   Primary Care Physician: Doreene Nest, NP  Primary Gastroenterologist:  Dr. Midge Minium   Chief Complaint  Patient presents with   New Patient (Initial Visit)    HX diverticulitis    HPI: Ashley [name] is a 57 y.o. female presents for ED follow-up of acute uncomplicated sigmoid diverticulitis.  She went to [facility] ED 06/17/2022.  CT abdomen and pelvis without contrast 06/17/2022 showed acute uncomplicated diverticulitis in the mid sigmoid colon.  No obstruction or perforation.  She was treated with Cipro 500 twice daily and Flagyl 500 3 times daily.  Last colonoscopy done 08/06/2021 by Dr. 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  5-year repeat.  EGD done 08/06/2021 showed mild gastritis, and a single umbilical lesion in the gastric antrum biopsied.  Biopsies showed mild chronic gastritis.  Negative for H. pylori and dysplasia.  Patient was treated for diverticulitis 07/2021 (diagnosed on CT scan).  She finished Cipro and Flagyl 1 week ago.  Abdominal pain improved on antibiotics.  She still has some mild residual lower abdominal pain and tenderness.  Denies fever, chills, nausea, vomiting.  Tried Colace stool softener for constipation with little benefit.  She has history of acid reflux.  Last saw Dr. Servando Snare for GERD 07/2021.  She is currently taking Protonix 40 mg daily which is not controlling her reflux.  Labs 07/05/2022 showed Normal CBC, hemoglobin 13.5, white count 4.2.  Normal CMP & Sed Rate.  Recent normal lipase.   Current Outpatient Medications  Medication Sig Dispense Refill   amLODipine (NORVASC) 10 MG tablet Take 1 tablet (10 mg total) by mouth daily. for blood pressure. 90 tablet 0   aspirin 81 MG EC tablet Take by mouth.      atorvastatin (LIPITOR) 10 MG tablet Take 1 tablet (10 mg total) by mouth daily. for cholesterol. 90 tablet 3   azelastine (ASTELIN) 0.1 % nasal spray Place into the nose.     ciprofloxacin (CIPRO) 500 MG tablet Take 1 tablet (500 mg total) by mouth 2 (two) times daily for 7 days. 14 tablet 0   fluticasone (FLONASE) 50 MCG/ACT nasal spray Place 1 spray into both nostrils 2 (two) times daily. 16 g 2   gabapentin (NEURONTIN) 600 MG tablet Take 1,200 mg by mouth daily.     metroNIDAZOLE (FLAGYL) 500 MG tablet Take 1 tablet (500 mg total) by mouth 2 (two) times daily for 7 days. 14 tablet 0   pantoprazole (PROTONIX) 40 MG tablet Take 1 tablet (40 mg total) by mouth 2 (two) times daily. 60 tablet 1   predniSONE (DELTASONE) 20 MG tablet Take 3 tablets by mouth in the morning for three days, then 2 tablets for three days, then 1 tablet for three days. 18 tablet 0   venlafaxine XR (EFFEXOR-XR) 75 MG 24 hr capsule Take 1 capsule (75 mg total) by mouth daily. 60 capsule 3   No current facility-administered medications for this visit.    Allergies as of 07/08/2022 - Review Complete 07/08/2022  Allergen Reaction Noted   Aczone [dapsone] Swelling 07/24/2021   Levaquin [levofloxacin]  08/12/2019      ROS:  General: Negative for anorexia, weight loss, fever, chills, fatigue, weakness. ENT: Negative for hoarseness, difficulty swallowing , nasal congestion. CV: Negative for chest pain, angina,  palpitations, dyspnea on exertion, peripheral edema.  Respiratory: Negative for dyspnea at rest, dyspnea on exertion, cough, sputum, wheezing.  GI: See history of present illness. GU:  Negative for dysuria, hematuria, urinary incontinence, urinary frequency, nocturnal urination.  Endo: Negative for unusual weight change.    Physical Examination:   BP (!) 149/93   Pulse 87   Temp 98.6 F (37 C)   Ht 5\' 6"  (1.676 m)   Wt 177 lb (80.3 kg)   BMI 28.57 kg/m   General: Well-nourished, well-developed in no  acute distress.  Eyes: No icterus. Conjunctivae pink. Mouth: Oropharyngeal mucosa moist and pink , no lesions erythema or exudate. Lungs: Clear to auscultation bilaterally. Non-labored. Heart: Regular rate and rhythm, no murmurs rubs or gallops.  Abdomen: Bowel sounds are normal, nondistended, no hepatosplenomegaly or masses, no abdominal bruits or hernia; There is mild RLQ, RUQ and Epigastric Tenderness.  NO LLQ tenderness.  No rebound or guarding.   Extremities: No lower extremity edema. No clubbing or deformities. Neuro: Alert and oriented x 3.  Grossly intact. Skin: Warm and dry, no jaundice.   Psych: Alert and cooperative, normal mood and affect.   Imaging Studies: CT Renal Stone Study  Result Date: 06/17/2022 CLINICAL DATA:  Right lower quadrant pain for four days. Patient states the pain began in the mid lower abdomen. EXAM: CT ABDOMEN AND PELVIS WITHOUT CONTRAST TECHNIQUE: Multidetector CT imaging of the abdomen and pelvis was performed following the standard protocol without IV contrast. RADIATION DOSE REDUCTION: This exam was performed according to the departmental dose-optimization program which includes automated exposure control, adjustment of the mA and/or kV according to patient size and/or use of iterative reconstruction technique. COMPARISON:  CT stone protocol 06/29/2021, CT abdomen pelvis 03/29/2021 FINDINGS: Lower chest: No acute abnormality. Hepatobiliary: No focal liver abnormality is seen. Status post cholecystectomy. No biliary dilatation. Pancreas: Unremarkable. No pancreatic ductal dilatation or surrounding inflammatory changes. Spleen: Normal in size without focal abnormality. Adrenals/Urinary Tract: Adrenal glands are unremarkable. Kidneys are normal, without renal calculi, focal lesion, or hydronephrosis. Bladder is unremarkable. Stomach/Bowel: Stomach is within normal limits. Appendix appears normal. Diverticulosis of the descending and sigmoid colon. Short segment of bowel  wall thickening in the mid sigmoid colon secondary to an inflammed diverticulum (series 2, image 75; series 5, image 43). Mild pericolonic fat stranding. No bowel obstruction or other inflammatory changes in the small bowel or elsewhere in the colon. Vascular/Lymphatic: Mild aortic atherosclerosis. No enlarged abdominal or pelvic lymph nodes. Reproductive: Status post hysterectomy. No adnexal masses. Other: No abdominal wall hernia or abnormality. No abdominopelvic ascites. Musculoskeletal: No acute or significant osseous findings. IMPRESSION: 1. Acute uncomplicated diverticulitis in the mid sigmoid colon. 2. Mild aortic atherosclerosis. Aortic Atherosclerosis (ICD10-I70.0). Electronically Signed   By: Sherron Ales M.D.   On: 06/17/2022 17:26    Assessment and Plan:   Evi Pollok is a 57 y.o. y/o female returns for follow-up of acute uncomplicated sigmoid diverticulitis.  Second episode confirmed on CT.  First episode was 07/2021.  Abdominal pain has greatly improved on Cipro and Flagyl.  She still has some mild residual pain.  I am treating with antibiotics for 1 more week to ensure resolution. Also giving treatment for mild constipation and adjusting treatment for acid reflux.  She had negative colonoscopy and EGD 07/2021, up-to-date.  It is very interesting that she has right side abdominal pain, yet CT showed sigmoid colon diverticulitis with no perforation or abscess.  Her pain has always been in the  right lower quadrant.  She has not had pain in the left lower quadrant.  Diverticulitis - Improved, yet not resolved  Rx Cipro 500mg  BID x 7 more days, #14, No refills.  Rx Flagyl 500mg  TID x 7 more days, #21, No refills.  Patient education given regarding diverticulitis and diverticulosis.  Constipation - Mild  Start Metamucil daily.  Start MiraLAX 1 capful once daily.  High-fiber diet, 64 ounces of water daily.   3.   GERD  Continue pantoprazole 40 Mg once daily every morning 30 minutes  before breakfast.  Add famotidine 20 mg once daily before bedtime.  Recommend Lifestyle Modifications to prevent Acid Reflux.  Rec. Avoid coffee, sodas, peppermint, citrus fruits, and spicey foods.  Avoid eating 2-3 hours before bedtime.     Celso Amy, PA-C  Follow up As Needed if symptoms worsen or persist.

## 2022-07-12 DIAGNOSIS — R519 Headache, unspecified: Secondary | ICD-10-CM

## 2022-07-12 MED ORDER — PREDNISONE 20 MG PO TABS
ORAL_TABLET | ORAL | 0 refills | Status: DC
Start: 2022-07-12 — End: 2022-08-19

## 2022-07-16 ENCOUNTER — Telehealth: Payer: Self-pay

## 2022-07-16 MED ORDER — AMOXICILLIN-POT CLAVULANATE 875-125 MG PO TABS: 1.0000 | ORAL_TABLET | Freq: Two times a day (BID) | ORAL | 0 refills | Status: DC

## 2022-07-16 NOTE — Telephone Encounter (Signed)
Patient verbalized understanding. She states her pain is sever now and she is headed to the ER now.

## 2022-07-16 NOTE — Telephone Encounter (Signed)
Sent medication to the pharmacy. Called and left a message for call back  

## 2022-07-16 NOTE — Telephone Encounter (Signed)
Patient was seen on 07/08/2022 for Diverticulitis. She is taking the Flagyl and the Cipro and the symptoms are not improving. She states she is having sharp pain all the way across her stomach and radiates to her right side. She states the pain is constant and sharp. She is having nausea but no vomiting. Has had some constipation. Denies any rectal bleeding. Had a bowel movement yesterday. Patient states she wants to know what she needs to do. She called to make a appointment with you but informed her you did not have a appointment till June so I would send you a message to see what you recommend. She states she will leave work and do what ever is needed

## 2022-07-17 ENCOUNTER — Telehealth: Payer: Self-pay

## 2022-07-17 NOTE — Transitions of Care (Post Inpatient/ED Visit) (Signed)
   07/17/2022  Name: Ashley [name] MRN: [id number] DOB: May 23, 1965  Today's TOC FU Call Status: Today's TOC FU Call Status:: Unsuccessul Call (1st Attempt) Unsuccessful Call (1st Attempt) Date: 07/17/22  Attempted to reach the patient regarding the most recent Inpatient/ED visit.  Follow Up Plan: Additional outreach attempts will be made to reach the patient to complete the Transitions of Care (Post Inpatient/ED visit) call.   Signature Elisha Ponder LPN [facility] AWV Team Direct dial:  [phone]

## 2022-07-18 NOTE — Transitions of Care (Post Inpatient/ED Visit) (Signed)
Unable to reach pt by phone and left v/m requesting pt call [phone].       07/18/2022  Name: Ashley [name] MRN: [id number] DOB: April 02, 1965  Today's TOC FU Call Status: Today's TOC FU Call Status:: Unsuccessful Call (2nd Attempt) Unsuccessful Call (1st Attempt) Date: 07/17/22 Unsuccessful Call (2nd Attempt) Date: 07/18/22  Attempted to reach the patient regarding the most recent Inpatient/ED visit.  Follow Up Plan: Additional outreach attempts will be made to reach the patient to complete the Transitions of Care (Post Inpatient/ED visit) call.   Signature Lewanda Rife, LPN

## 2022-07-19 NOTE — Transitions of Care (Post Inpatient/ED Visit) (Signed)
I spoke with pt who was seen at [facility] ED on 07/16/22 and pt said she is feeling better; pt was told could be diverticulitis or gastritis and pt has appt at [facility] with Celso Amy PA on 08/06/22 with UC & ED precautions given and pt voiced understanding. Pt will call [facility] if FU needed with PCP. pt did not receive any new meds at ED and pt has no questions about meds presently taking. Sending note to Allayne Gitelman NP.         07/19/2022  Name: Ashley [name] MRN: [id number] DOB: 08/05/1965  Today's TOC FU Call Status: Today's TOC FU Call Status:: Unsuccessful Call (2nd Attempt) Unsuccessful Call (1st Attempt) Date: 07/17/22 Unsuccessful Call (2nd Attempt) Date: 07/18/22 [facility] FU Call Complete Date: 07/19/22  Transition Care Management Follow-up Telephone Call Date of Discharge: 07/16/22 Discharge Facility: Other [facility]) ([facility]) Name of Other ([facility]) Discharge Facility: [facility] Type of Discharge: Emergency Department Reason for ED Visit: Other: (abd pain; diverticulitis; gastritis) How have you been since you were released from the hospital?: Better Any questions or concerns?: No  Items Reviewed: Did you receive and understand the discharge instructions provided?: Yes Medications obtained,verified, and reconciled?: Yes (Medications Reviewed) (pt did not receive any new meds at ED and pt has no questions about meds presently taking.) Any new allergies since your discharge?: No Dietary orders reviewed?: NA Do you have support at home?: Yes People in Home: child(ren), adult Name of Support/Comfort Primary Source: Darrell  Medications Reviewed Today: Medications Reviewed Today     Reviewed by Celso Amy, PA-C (Physician Assistant) on 07/08/22 at 1651  Med List Status: <None>   Medication Order Taking? Sig Documenting Provider Last Dose Status Informant  amLODipine (NORVASC) 10 MG tablet [id number] Yes Take 1 tablet (10 mg total) by mouth daily. for blood pressure.  Doreene Nest, NP Taking Active   aspirin 81 MG EC tablet [id number] Yes Take by mouth. [provider] Taking Active   atorvastatin (LIPITOR) 10 MG tablet [id number] Yes Take 1 tablet (10 mg total) by mouth daily. for cholesterol. Doreene Nest, NP Taking Active   azelastine (ASTELIN) 0.1 % nasal spray [id number] Yes Place into the nose. [provider] Taking Active   ciprofloxacin (CIPRO) 500 MG tablet [id number] Yes Take 1 tablet (500 mg total) by mouth 2 (two) times daily for 7 days. Celso Amy, PA-C  Active   fluticasone [facility]) 50 MCG/ACT nasal spray [id number] Yes Place 1 spray into both nostrils 2 (two) times daily. Tower, Audrie Gallus, MD Taking Active   gabapentin (NEURONTIN) 600 MG tablet [id number] Yes Take 1,200 mg by mouth daily. [provider] Taking Active   metroNIDAZOLE (FLAGYL) 500 MG tablet [id number] Yes Take 1 tablet (500 mg total) by mouth 2 (two) times daily for 7 days. Celso Amy, PA-C  Active   pantoprazole (PROTONIX) 40 MG tablet [id number] Yes Take 1 tablet (40 mg total) by mouth 2 (two) times daily. Arnaldo Natal, MD Taking Active   predniSONE (DELTASONE) 20 MG tablet [id number] Yes Take 3 tablets by mouth in the morning for three days, then 2 tablets for three days, then 1 tablet for three days. Doreene Nest, NP Taking Active   venlafaxine XR (EFFEXOR-XR) 75 MG 24 hr capsule [id number] Yes Take 1 capsule (75 mg total) by mouth daily. Tereso Newcomer, MD Taking Active             Home Care and Equipment/Supplies: Were Home Health Services  Ordered?: NA Any new equipment or medical supplies ordered?: NA  Functional Questionnaire: Do you need assistance with bathing/showering or dressing?: No Do you need assistance with meal preparation?: No Do you need assistance with eating?: No Do you have difficulty maintaining continence: No Do you need assistance with getting out of bed/getting out of a chair/moving?: No Do you have  difficulty managing or taking your medications?: No  Follow up appointments reviewed: PCP Follow-up appointment confirmed?: NA Specialist Hospital Follow-up appointment confirmed?: Yes Date of Specialist follow-up appointment?: 08/06/22 Follow-Up Specialty Provider:: [facility]; Lora Havens PA Do you need transportation to your follow-up appointment?: No Do you understand care options if your condition(s) worsen?: Yes-patient verbalized understanding    SIGNATURE Lewanda Rife, LPN

## 2022-07-19 NOTE — Telephone Encounter (Signed)
Noted, sorry to hear about this. Okay to proceed with GI follow-up as scheduled.

## 2022-08-06 ENCOUNTER — Ambulatory Visit: Payer: Managed Care, Other (non HMO) | Admitting: Physician Assistant

## 2022-08-19 ENCOUNTER — Encounter: Payer: Self-pay | Admitting: Primary Care

## 2022-08-19 ENCOUNTER — Ambulatory Visit (INDEPENDENT_AMBULATORY_CARE_PROVIDER_SITE_OTHER): Payer: Managed Care, Other (non HMO) | Admitting: Primary Care

## 2022-08-19 VITALS — BP 146/86 | HR 95 | Temp 97.2°F | Ht 66.0 in | Wt 176.0 lb

## 2022-08-19 DIAGNOSIS — I10 Essential (primary) hypertension: Secondary | ICD-10-CM | POA: Diagnosis not present

## 2022-08-19 DIAGNOSIS — R053 Chronic cough: Secondary | ICD-10-CM

## 2022-08-19 DIAGNOSIS — M159 Polyosteoarthritis, unspecified: Secondary | ICD-10-CM | POA: Diagnosis not present

## 2022-08-19 NOTE — Patient Instructions (Signed)
Start an antihistamine once daily such as loratadine (Claritin), cetrizine (Zyrtec), fexofenadine (Allegra).  Add montelukast (Singulair) 10 mg at bedtime if symptoms persist after 1 week.  Increase your amlodipine by taking 5 mg twice daily for blood pressure.  It was a pleasure to see you today!

## 2022-08-19 NOTE — Assessment & Plan Note (Signed)
Exam and HPI without evidence for sick symptoms.  Start antihistamine daily x 1 to 2 weeks. Add Singulair 10 mg at bedtime if symptoms persist. Continue pantoprazole 40 mg twice daily.

## 2022-08-19 NOTE — Progress Notes (Signed)
Subjective:    Patient ID: Ashley [name], female    DOB: 1965/04/28, 57 y.o.   MRN: [id number]  Cough Associated symptoms include postnasal drip. Pertinent negatives include no chills or fever.    Ashley [name] is a very pleasant 57 y.o. female with a history of hypertension, allergic rhinitis, tinnitus of ears, prediabetes, frequent headaches who presents today to discuss cough. She is also noted to be hypertensive.  She presented to [facility] urgent care on 07/25/2022 for a 10-day history of intermittent cough that was productive of yellow sputum.  She was diagnosed with viral URI with cough, treated with Tessalon Perles 200 mg daily and Singulair 10 mg daily.  Blood pressure was noted to be above goal despite compliance to amlodipine 10 mg daily. She was treated with Augmentin 10 days prior for acute diverticulitis.   Today she continues to notice a mostly dry cough that has now been present for about 1 month. She notices post nasal drip. She never began taking Singulair 10 mg. She is not taking an antihistamine. She has an appointment scheduled with ENT for TMJ. She is compliant to amlodipine but is only taking 5 mg once daily she noticed bilateral ankle edema. She is checking BP at home which is running mid to high 130's/80's.   She denies chills, fevers, sick symptoms.  She is also needing a handicap placard for chronic osteoarthritis.   BP Readings from Last 3 Encounters:  08/19/22 (!) 146/86  07/08/22 (!) 149/93  07/05/22 (!) 146/94      Review of Systems  Constitutional:  Negative for chills and fever.  HENT:  Positive for postnasal drip. Negative for congestion.   Respiratory:  Positive for cough.          Past Medical History:  Diagnosis Date   Abdominal pain 06/23/2018   Acute breast pain 01/30/2022   Acute midline low back pain without sciatica 09/10/2019   Acute right flank pain 06/29/2021   Adverse effect of drug 08/15/2019   Anemia     Bronchitis    2 years ago    [facility] arthritis 2019   base of left thumb    Constipation 04/07/2019   Discoloration of skin 05/17/2021   Diverticulitis of colon    GERD (gastroesophageal reflux disease)    diet controlled , no meds   Hydrosalpinx    bilateral    Hyperlipidemia    recent dx - currently diet control - no meds   Hypertension    Hypothyroid 2003   radio active iodine - no meds   Left-sided weakness 02/12/2019   Ovarian cyst, left 02/23/2016   Urinary frequency 11/30/2018   Vitamin B 12 deficiency     Social History   Socioeconomic History   Marital status: Single    Spouse name: Not on file   Number of children: Not on file   Years of education: Not on file   Highest education level: Not on file  Occupational History   Not on file  Tobacco Use   Smoking status: Never   Smokeless tobacco: Never  Vaping Use   Vaping Use: Never used  Substance and Sexual Activity   Alcohol use: No   Drug use: No   Sexual activity: Yes    Partners: Male    Birth control/protection: Surgical    Comment: hysterectomy.  Other Topics Concern   Not on file  Social History Narrative   Single.   3 children.   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   Enjoys relaxing, exercise, walking her dog.    Social Determinants of Health   Financial Resource Strain: Not on file  Food Insecurity: Not on file  Transportation Needs: Not on file  Physical Activity: Not on file  Stress: Not on file  Social Connections: Not on file  Intimate Partner Violence: Not on file    Past Surgical History:  Procedure Laterality Date   CESAREAN SECTION     x 2   CHOLECYSTECTOMY  2008   COLONOSCOPY WITH PROPOFOL N/A 08/06/2021   Procedure: COLONOSCOPY WITH PROPOFOL;  Surgeon: Midge Minium, MD;  Location[facility];  Service: Endoscopy;  Laterality: N/A;   DILATION AND CURETTAGE OF UTERUS     X 2   ESOPHAGOGASTRODUODENOSCOPY (EGD) WITH PROPOFOL N/A 08/06/2021   Procedure:  ESOPHAGOGASTRODUODENOSCOPY (EGD) WITH PROPOFOL;  Surgeon: Midge Minium, MD;  Location: [facility];  Service: Endoscopy;  Laterality: N/A;   LAPAROSCOPIC BILATERAL SALPINGECTOMY Bilateral 07/29/2017   Procedure: LAPAROSCOPIC LEFT SALPINGOOPHRECTOMY: Partial right Salpingectomy;  Surgeon: Willodean Rosenthal, MD;  Location: [facility];  Service: Gynecology;  Laterality: Bilateral;   POLYPECTOMY  08/06/2021   Procedure: POLYPECTOMY;  Surgeon: Midge Minium, MD;  Location: [facility];  Service: Endoscopy;;   SHOULDER SURGERY  2016   arthroscopic   TOTAL ABDOMINAL HYSTERECTOMY  2006   Fibroids, benign pathology   TUBAL LIGATION     WISDOM TOOTH EXTRACTION     at age 2 yr   WRIST ARTHROSCOPY Right 2007    Family History  Problem Relation Age of Onset   Hypertension Mother    Ovarian cancer Mother    Other Father        unknown medical history   Hypertension Maternal Grandmother    Heart failure Maternal Grandmother    Lung cancer Maternal Grandmother    Breast cancer Maternal Aunt    Colon cancer Neg Hx    Colon polyps Neg Hx    Rectal cancer Neg Hx    Stomach cancer Neg Hx     Allergies  Allergen Reactions   Aczone [Dapsone] Swelling   Levaquin [Levofloxacin]     Presumed cause of joint aches    Current Outpatient Medications on File Prior to Visit  Medication Sig Dispense Refill   amLODipine (NORVASC) 10 MG tablet Take 10 mg by mouth daily.     aspirin 81 MG EC tablet Take by mouth.     atorvastatin (LIPITOR) 10 MG tablet Take 1 tablet (10 mg total) by mouth daily. for cholesterol. 90 tablet 3   azelastine (ASTELIN) 0.1 % nasal spray Place into the nose.     fluticasone (FLONASE) 50 MCG/ACT nasal spray Place 1 spray into both nostrils 2 (two) times daily. 16 g 2   gabapentin (NEURONTIN) 600 MG tablet Take 1,200 mg by mouth daily.     pantoprazole (PROTONIX) 40 MG tablet Take 1 tablet (40 mg total) by mouth 2 (two) times daily. 60 tablet  1   venlafaxine XR (EFFEXOR-XR) 75 MG 24 hr capsule Take 1 capsule (75 mg total) by mouth daily. 60 capsule 3   No current facility-administered medications on file prior to visit.    BP (!) 146/86   Pulse 95   Temp (!) 97.2 F (36.2 C) (Temporal)   Ht 5\' 6"  (1.676 m)   Wt 176 lb (79.8 kg)   SpO2 99%   BMI 28.41 kg/m  Objective:   Physical Exam Cardiovascular:  Rate and Rhythm: Normal rate and regular rhythm.  Pulmonary:     Effort: Pulmonary effort is normal.     Breath sounds: Normal breath sounds.  Musculoskeletal:     Cervical back: Neck supple.  Skin:    General: Skin is warm and dry.           Assessment & Plan:  Persistent cough for 3 weeks or longer Assessment & Plan: Exam and HPI without evidence for sick symptoms.  Start antihistamine daily x 1 to 2 weeks. Add Singulair 10 mg at bedtime if symptoms persist. Continue pantoprazole 40 mg twice daily.   Osteoarthritis of multiple joints, unspecified osteoarthritis type Assessment & Plan: Agree to provide handicap placard for ongoing joint pain.   Essential hypertension Assessment & Plan: Above goal as she is not taking amlodipine 10 mg due to ankle edema.  She will increase her amlodipine by taking 5 mg twice daily.  She has plenty of the 10 mg tablets and will cut them in half. She will update if blood pressure remains at or above 135/90 consistently.         Doreene Nest, NP

## 2022-08-19 NOTE — Assessment & Plan Note (Signed)
Above goal as she is not taking amlodipine 10 mg due to ankle edema.  She will increase her amlodipine by taking 5 mg twice daily.  She has plenty of the 10 mg tablets and will cut them in half. She will update if blood pressure remains at or above 135/90 consistently.

## 2022-08-19 NOTE — Assessment & Plan Note (Signed)
Agree to provide handicap placard for ongoing joint pain.

## 2022-08-21 ENCOUNTER — Ambulatory Visit: Payer: Managed Care, Other (non HMO) | Admitting: Physician Assistant

## 2022-08-22 ENCOUNTER — Ambulatory Visit
Admission: EM | Admit: 2022-08-22 | Discharge: 2022-08-22 | Disposition: A | Discharge status: Home / Self Care | Payer: Managed Care, Other (non HMO) | Attending: Physician Assistant | Admitting: Physician Assistant

## 2022-08-22 DIAGNOSIS — H5789 Other specified disorders of eye and adnexa: Secondary | ICD-10-CM

## 2022-08-22 DIAGNOSIS — R21 Rash and other nonspecific skin eruption: Secondary | ICD-10-CM

## 2022-08-22 MED ORDER — METHYLPREDNISOLONE 4 MG PO TBPK: ORAL_TABLET | ORAL | 0 refills | Status: DC

## 2022-08-22 NOTE — ED Provider Notes (Signed)
[facility]    CSN: [id number] Arrival date & time: 08/22/22  1156      History   Chief Complaint Chief Complaint  Patient presents with   Allergic Reaction   Facial Swelling    HPI Ashley [name] is a 57 y.o. female with history of GERD, hypertension, hypothyroidism allergic rhinitis, anemia, hyperlipidemia, chronic back pain, frequent headaches, chronic cough, and OA of multiple joints.   Patient seen by PCP 3 days ago for concerns about >3 week history of cough.  She was noted to have been seen at [facility] urgent care on 07/25/2022.  She was given Lawyer and Singulair.  She was also treated with Augmentin for possible diverticulitis.  Reporting 1 month history of cough that is mostly dry, postnasal drip.  She is taking the Singulair.  Reported not taking any antihistamines when she was seen by PCP 3 days ago.  Patient had an appointment with ENT specialist yesterday and says they squirted something in her nose.  Today she noticed swelling of her eyelids and a rash between her eyes and nose.  The rash is itchy but not painful.  Denies taking any Benadryl.  She reports that she did dye her hair yesterday but does not have any scalp irritation and has used the same dye before without any issues.  She is unsure as to what has caused her potential allergic reaction today.  HPI  Past Medical History:  Diagnosis Date   Abdominal pain 06/23/2018   Acute breast pain 01/30/2022   Acute midline low back pain without sciatica 09/10/2019   Acute right flank pain 06/29/2021   Adverse effect of drug 08/15/2019   Anemia    Bronchitis    2 years ago    [facility] arthritis 2019   base of left thumb    Constipation 04/07/2019   Discoloration of skin 05/17/2021   Diverticulitis of colon    GERD (gastroesophageal reflux disease)    diet controlled , no meds   Hydrosalpinx    bilateral    Hyperlipidemia    recent dx - currently diet control - no meds   Hypertension     Hypothyroid 2003   radio active iodine - no meds   Left-sided weakness 02/12/2019   Ovarian cyst, left 02/23/2016   Urinary frequency 11/30/2018   Vitamin B 12 deficiency     Patient Active Problem List   Diagnosis Date Noted   Frequent headaches 06/11/2022   Chronic pain of right knee 01/30/2022   TMJ (temporomandibular joint syndrome) 09/07/2021   Polyp of sigmoid colon    Left lower quadrant abdominal pain 06/29/2021   History of diverticulitis 06/29/2021   Adjustment reaction with anxiety and depression 05/09/2021   Osteoarthritis of carpometacarpal (CMC) joint of thumb 04/09/2021   Pain of right thumb 04/09/2021   Persistent cough for 3 weeks or longer 10/04/2020   Palpitations 10/04/2020   Prediabetes 06/15/2020   Chronic hand pain 05/10/2020   Neuropathy 12/13/2019   Lumbar stenosis without neurogenic claudication 12/09/2019   Cervical radiculitis 11/19/2019   Foraminal stenosis of cervical region 11/19/2019   Dizziness 10/07/2019   Chronic bilateral low back pain 09/10/2019   HSV (herpes simplex virus) anogenital infection 06/25/2019   Encounter for annual general medical examination with abnormal findings in adult 06/23/2019   Chronic neck pain 02/17/2019   Essential hypertension    History of TIA (transient ischemic attack)    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   Numbness and  tingling 02/01/2019   Ganglion cyst of tendon sheath of left hand 11/13/2018   Acquired trigger finger 10/26/2018   Osteoarthritis 06/30/2018   Vitamin D deficiency 06/23/2018   Vitamin B 12 deficiency 06/23/2018   Generalized abdominal pain 06/23/2018   GERD (gastroesophageal reflux disease) 01/30/2018   Hyperlipidemia 03/19/2017   Allergic rhinitis 03/19/2017   Injury of right shoulder 04/18/2015   Impingement syndrome of left shoulder 12/27/2014   Impingement syndrome of right shoulder 12/27/2014   Chronic right shoulder pain 11/29/2014    Past Surgical History:  Procedure  Laterality Date   CESAREAN SECTION     x 2   CHOLECYSTECTOMY  2008   COLONOSCOPY WITH PROPOFOL N/A 08/06/2021   Procedure: COLONOSCOPY WITH PROPOFOL;  Surgeon: Midge Minium, MD;  Location: [facility];  Service: Endoscopy;  Laterality: N/A;   DILATION AND CURETTAGE OF UTERUS     X 2   ESOPHAGOGASTRODUODENOSCOPY (EGD) WITH PROPOFOL N/A 08/06/2021   Procedure: ESOPHAGOGASTRODUODENOSCOPY (EGD) WITH PROPOFOL;  Surgeon: Midge Minium, MD;  Location: [facility];  Service: Endoscopy;  Laterality: N/A;   LAPAROSCOPIC BILATERAL SALPINGECTOMY Bilateral 07/29/2017   Procedure: LAPAROSCOPIC LEFT SALPINGOOPHRECTOMY: Partial right Salpingectomy;  Surgeon: Willodean Rosenthal, MD;  Location: [facility];  Service: Gynecology;  Laterality: Bilateral;   POLYPECTOMY  08/06/2021   Procedure: POLYPECTOMY;  Surgeon: Midge Minium, MD;  Location: [facility];  Service: Endoscopy;;   SHOULDER SURGERY  2016   arthroscopic   TOTAL ABDOMINAL HYSTERECTOMY  2006   Fibroids, benign pathology   TUBAL LIGATION     WISDOM TOOTH EXTRACTION     at age 57 yr   WRIST ARTHROSCOPY Right 2007    OB History     Gravida  5   Para  3   Term  3   Preterm  0   AB  2   Living  3      SAB  2   IAB  0   Ectopic  0   Multiple  0   Live Births  3            Home Medications    Prior to Admission medications   Medication Sig Start Date End Date Taking? Authorizing Provider  amLODipine (NORVASC) 10 MG tablet Take 10 mg by mouth daily.   Yes [provider]  aspirin 81 MG EC tablet Take by mouth. 09/16/19  Yes [provider]  atorvastatin (LIPITOR) 10 MG tablet Take 1 tablet (10 mg total) by mouth daily. for cholesterol. 06/26/21  Yes Doreene Nest, NP  azelastine (ASTELIN) 0.1 % nasal spray Place into the nose. 03/11/19  Yes [provider]  fluticasone (FLONASE) 50 MCG/ACT nasal spray Place 1 spray into both nostrils 2 (two) times  daily. 11/19/19  Yes Tower, Audrie Gallus, MD  gabapentin (NEURONTIN) 600 MG tablet Take 1,200 mg by mouth daily. 01/25/20  Yes [provider]  methylPREDNISolone (MEDROL DOSEPAK) 4 MG TBPK tablet Take po according to dosepack 08/22/22  Yes Eusebio Friendly B, PA-C  pantoprazole (PROTONIX) 40 MG tablet Take 1 tablet (40 mg total) by mouth 2 (two) times daily. 01/07/22 01/07/23 Yes Arnaldo Natal, MD  venlafaxine XR (EFFEXOR-XR) 75 MG 24 hr capsule Take 1 capsule (75 mg total) by mouth daily. 05/14/22  Yes Anyanwu, Jethro Bastos, MD    Family History Family History  Problem Relation Age of Onset   Hypertension Mother    Ovarian cancer Mother    Other Father  unknown medical history   Hypertension Maternal Grandmother    Heart failure Maternal Grandmother    Lung cancer Maternal Grandmother    Breast cancer Maternal Aunt    Colon cancer Neg Hx    Colon polyps Neg Hx    Rectal cancer Neg Hx    Stomach cancer Neg Hx     Social History Social History   Tobacco Use   Smoking status: Never   Smokeless tobacco: Never  Vaping Use   Vaping Use: Never used  Substance Use Topics   Alcohol use: No   Drug use: No     Allergies   Aczone [dapsone] and Levaquin [levofloxacin]   Review of Systems Review of Systems  Constitutional:  Negative for chills, diaphoresis, fatigue and fever.  HENT:  Positive for congestion, facial swelling and postnasal drip. Negative for ear pain, rhinorrhea, sinus pressure, sinus pain and sore throat.   Eyes:  Positive for itching. Negative for redness.  Respiratory:  Positive for cough. Negative for shortness of breath.   Gastrointestinal:  Negative for abdominal pain, nausea and vomiting.  Musculoskeletal:  Negative for arthralgias and myalgias.  Skin:  Positive for rash.  Neurological:  Negative for weakness and headaches.  Hematological:  Negative for adenopathy.     Physical Exam Triage Vital Signs ED Triage Vitals  Enc Vitals Group     BP       Pulse      Resp      Temp      Temp src      SpO2      Weight      Height      Head Circumference      Peak Flow      Pain Score      Pain Loc      Pain Edu?      Excl. in GC?    No data found.  Updated Vital Signs BP (!) 150/101 (BP Location: Left Arm)   Pulse 84   Temp 99.4 F (37.4 C) (Oral)   Ht 5\' 6"  (1.676 m)   Wt 175 lb (79.4 kg)   SpO2 100%   BMI 28.25 kg/m    Physical Exam Vitals and nursing note reviewed.  Constitutional:      General: She is not in acute distress.    Appearance: Normal appearance. She is not ill-appearing or toxic-appearing.  HENT:     Head: Normocephalic and atraumatic.     Nose: Nose normal.     Mouth/Throat:     Mouth: Mucous membranes are moist.     Pharynx: Oropharynx is clear.  Eyes:     General: No scleral icterus.       Right eye: No discharge.        Left eye: No discharge.     Conjunctiva/sclera:     Right eye: Right conjunctiva is injected (mild).     Left eye: Left conjunctiva is injected (mild).     Comments: Mild edema of bilateral upper and lower eyelids  Cardiovascular:     Rate and Rhythm: Normal rate and regular rhythm.     Heart sounds: Normal heart sounds.  Pulmonary:     Effort: Pulmonary effort is normal. No respiratory distress.     Breath sounds: Normal breath sounds.  Musculoskeletal:     Cervical back: Neck supple.  Skin:    General: Skin is dry.     Findings: Rash (faint erythematous papular rash between eyes/sides of nose)  present.  Neurological:     General: No focal deficit present.     Mental Status: She is alert. Mental status is at baseline.     Motor: No weakness.     Gait: Gait normal.  Psychiatric:        Mood and Affect: Mood normal.        Behavior: Behavior normal.        Thought Content: Thought content normal.      UC Treatments / Results  Labs (all labs ordered are listed, but only abnormal results are displayed) Labs Reviewed - No data to  display  EKG   Radiology No results found.  Procedures Procedures (including critical care time)  Medications Ordered in UC Medications - No data to display  Initial Impression / Assessment and Plan / UC Course  I have reviewed the triage vital signs and the nursing notes.  Pertinent labs & imaging results that were available during my care of the patient were reviewed by me and considered in my medical decision making (see chart for details).   57 year old female presents for bilateral eye lid swelling and rash between eyes upon waking this morning.  She reports she was seen by ENT specialist yesterday for concerns about chronic cough, TMJ and sinus problems.  Reports they used a special spray in her nose and she thinks she could have had an allergic reaction to it.  She is not sure.  She is not taking any antihistamines.  On exam she does have mild edema bilateral upper and lower eyelids, worse on the right side and she also has papular mildly erythematous rash noted to face.  Mild injection of bilateral conjunctiva.  Symptoms are consistent with allergies.  Advised her to start Benadryl and cryotherapy.  Sent corticosteroid taper.  Reviewed close monitoring, return and ER precautions.   Final Clinical Impressions(s) / UC Diagnoses   Final diagnoses:  Eye swelling, bilateral  Facial rash     Discharge Instructions      -Start taking Benadryl 25 mg every 6 hours until symptoms resolved.  Then you should think about taking a daily antihistamine such as Claritin. - Ice your eyes every few hours to help with swelling. - Start the corticosteroid taper.  This does have the ability to raise your blood pressure so keep an eye on your blood pressure.  May apply hydrocortisone cream very sparingly to the rash areas. - Go to ER if you have any significant worsening of your swelling.     ED Prescriptions     Medication Sig Dispense Auth. Provider   methylPREDNISolone (MEDROL  DOSEPAK) 4 MG TBPK tablet Take po according to dosepack 21 tablet Shirlee Latch, PA-C      PDMP not reviewed this encounter.   Shirlee Latch, PA-C 08/22/22 1230

## 2022-08-22 NOTE — ED Triage Notes (Signed)
Pt c/o right eye swelling, facial swelling, and bumps forming around eyes x1day  Pt saw her ENT doctor yesterday and was given a nasal spray. Pt also dyed her hair last night   Pt states that her eyes are itching  Pt denies any throat swelling or trouble breathing.   Pt denies any trouble eating or swallowing.

## 2022-08-22 NOTE — Discharge Instructions (Addendum)
-  Start taking Benadryl 25 mg every 6 hours until symptoms resolved.  Then you should think about taking a daily antihistamine such as Claritin. - Ice your eyes every few hours to help with swelling. - Start the corticosteroid taper.  This does have the ability to raise your blood pressure so keep an eye on your blood pressure.  May apply hydrocortisone cream very sparingly to the rash areas. - Go to ER if you have any significant worsening of your swelling.

## 2022-09-02 ENCOUNTER — Encounter: Payer: Self-pay | Admitting: *Deleted

## 2022-09-02 ENCOUNTER — Encounter: Payer: Self-pay | Admitting: Primary Care

## 2022-09-02 ENCOUNTER — Ambulatory Visit (INDEPENDENT_AMBULATORY_CARE_PROVIDER_SITE_OTHER): Payer: Managed Care, Other (non HMO) | Admitting: Primary Care

## 2022-09-02 VITALS — BP 128/76 | HR 90 | Temp 97.3°F | Ht 66.0 in | Wt 173.0 lb

## 2022-09-02 DIAGNOSIS — Z8719 Personal history of other diseases of the digestive system: Secondary | ICD-10-CM

## 2022-09-02 DIAGNOSIS — R1084 Generalized abdominal pain: Secondary | ICD-10-CM

## 2022-09-02 MED ORDER — CIPROFLOXACIN HCL 500 MG PO TABS
500.00 mg | ORAL_TABLET | Freq: Two times a day (BID) | ORAL | 0 refills | Status: AC
Start: 2022-09-02 — End: 2022-09-12

## 2022-09-02 MED ORDER — METRONIDAZOLE 500 MG PO TABS
500.00 mg | ORAL_TABLET | Freq: Three times a day (TID) | ORAL | 0 refills | Status: AC
Start: 2022-09-02 — End: 2022-09-12

## 2022-09-02 NOTE — Progress Notes (Signed)
Subjective:    Patient ID: Ashley [name], female    DOB: 1965/11/23, 57 y.o.   MRN: [id number]  Abdominal Pain Associated symptoms include constipation. Pertinent negatives include no diarrhea, fever or vomiting.    Ashley [name] is a very pleasant 57 y.o. female with a history of acute diverticulitis, GERD, hypertension, frequent headaches who presents today to discuss abdominal pain.   Her pain began to the epigastric region about one week ago with a burning sensation. This his typically how her diverticulitis flares have begun. The burning sensation moved throughout her abdomen. About two days ago she began noticing pain to the mid lower abdomen with radiation to bilateral lower and lateral abdomen.   She's been on a clear liquid diet for the last few days with a slight improvement in her symptoms. She's eating a bland diet now to help facilitate a bowel movement for her chronic constipation.    She denies diarrhea, fevers, vomiting, bloody stools. She does struggle with chronic constipation.   She resumed her pantoprazole 40 mg once daily about 5-6 days ago. She's also been taking Rolaids and Tums with improvement in her GERD. She avoids fried/fatty foods, and any trigger foods.   Her last diverticulitis flare was in late April 2024, uncomplicated, but without resolve. She was treated last on 07/08/22 with Cipro and Flagyl course per GI, completed a 7 day course total. She would also like a new referral to see GI through [facility].   Review of Systems  Constitutional:  Negative for fever.  Gastrointestinal:  Positive for abdominal pain and constipation. Negative for blood in stool, diarrhea and vomiting.       Esophageal burning          Past Medical History:  Diagnosis Date   Abdominal pain 06/23/2018   Acute breast pain 01/30/2022   Acute midline low back pain without sciatica 09/10/2019   Acute right flank pain 06/29/2021   Adverse effect of drug  08/15/2019   Anemia    Bronchitis    2 years ago    [place] arthritis 2019   base of left thumb    Constipation 04/07/2019   Discoloration of skin 05/17/2021   Diverticulitis of colon    GERD (gastroesophageal reflux disease)    diet controlled , no meds   Hydrosalpinx    bilateral    Hyperlipidemia    recent dx - currently diet control - no meds   Hypertension    Hypothyroid 2003   radio active iodine - no meds   Left-sided weakness 02/12/2019   Ovarian cyst, left 02/23/2016   Urinary frequency 11/30/2018   Vitamin B 12 deficiency     Social History   Socioeconomic History   Marital status: Single    Spouse name: Not on file   Number of children: Not on file   Years of education: Not on file   Highest education level: Not on file  Occupational History   Not on file  Tobacco Use   Smoking status: Never   Smokeless tobacco: Never  Vaping Use   Vaping Use: Never used  Substance and Sexual Activity   Alcohol use: No   Drug use: No   Sexual activity: Yes    Partners: Male    Birth control/protection: Surgical    Comment: hysterectomy.  Other Topics Concern   Not on file  Social History Narrative   Single.   3 children.   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   Enjoys relaxing, exercise, walking her dog.    Social Determinants of Health   Financial Resource Strain: Not on file  Food Insecurity: Not on file  Transportation Needs: Not on file  Physical Activity: Not on file  Stress: Not on file  Social Connections: Not on file  Intimate Partner Violence: Not on file    Past Surgical History:  Procedure Laterality Date   CESAREAN SECTION     x 2   CHOLECYSTECTOMY  2008   COLONOSCOPY WITH PROPOFOL N/A 08/06/2021   Procedure: COLONOSCOPY WITH PROPOFOL;  Surgeon: Midge Minium, MD;  Location: [facility];  Service: Endoscopy;  Laterality: N/A;   DILATION AND CURETTAGE OF UTERUS     X 2   ESOPHAGOGASTRODUODENOSCOPY (EGD) WITH PROPOFOL N/A 08/06/2021   Procedure:  ESOPHAGOGASTRODUODENOSCOPY (EGD) WITH PROPOFOL;  Surgeon: Midge Minium, MD;  Location: [facility];  Service: Endoscopy;  Laterality: N/A;   LAPAROSCOPIC BILATERAL SALPINGECTOMY Bilateral 07/29/2017   Procedure: LAPAROSCOPIC LEFT SALPINGOOPHRECTOMY: Partial right Salpingectomy;  Surgeon: Willodean Rosenthal, MD;  Location: [facility] [place];  Service: Gynecology;  Laterality: Bilateral;   POLYPECTOMY  08/06/2021   Procedure: POLYPECTOMY;  Surgeon: Midge Minium, MD;  Location: [facility];  Service: Endoscopy;;   SHOULDER SURGERY  2016   arthroscopic   TOTAL ABDOMINAL HYSTERECTOMY  2006   Fibroids, benign pathology   TUBAL LIGATION     WISDOM TOOTH EXTRACTION     at age 33 yr   WRIST ARTHROSCOPY Right 2007    Family History  Problem Relation Age of Onset   Hypertension Mother    Ovarian cancer Mother    Other Father        unknown medical history   Hypertension Maternal Grandmother    Heart failure Maternal Grandmother    Lung cancer Maternal Grandmother    Breast cancer Maternal Aunt    Colon cancer Neg Hx    Colon polyps Neg Hx    Rectal cancer Neg Hx    Stomach cancer Neg Hx     Allergies  Allergen Reactions   Aczone [Dapsone] Swelling   Levaquin [Levofloxacin]     Presumed cause of joint aches    Current Outpatient Medications on File Prior to Visit  Medication Sig Dispense Refill   amLODipine (NORVASC) 10 MG tablet Take 10 mg by mouth daily.     aspirin 81 MG EC tablet Take by mouth.     atorvastatin (LIPITOR) 10 MG tablet Take 1 tablet (10 mg total) by mouth daily. for cholesterol. 90 tablet 3   azelastine (ASTELIN) 0.1 % nasal spray Place into the nose.     fluticasone (FLONASE) 50 MCG/ACT nasal spray Place 1 spray into both nostrils 2 (two) times daily. 16 g 2   gabapentin (NEURONTIN) 600 MG tablet Take 1,200 mg by mouth daily.     pantoprazole (PROTONIX) 40 MG tablet Take 1 tablet (40 mg total) by mouth 2 (two) times daily. 60 tablet  1   venlafaxine XR (EFFEXOR-XR) 75 MG 24 hr capsule Take 1 capsule (75 mg total) by mouth daily. (Patient not taking: Reported on 09/02/2022) 60 capsule 3   No current facility-administered medications on file prior to visit.    BP 128/76   Pulse 90   Temp (!) 97.3 F (36.3 C) (Temporal)   Ht 5\' 6"  (1.676 m)   Wt 173 lb (78.5 kg)   SpO2 98%   BMI 27.92 kg/m  Objective:   Physical Exam Constitutional:  General: She is not in acute distress. Cardiovascular:     Rate and Rhythm: Normal rate.  Pulmonary:     Effort: Pulmonary effort is normal.  Abdominal:     Tenderness: There is abdominal tenderness in the right upper quadrant, right lower quadrant, periumbilical area, suprapubic area and left lower quadrant. There is no guarding.  Neurological:     Mental Status: She is alert.           Assessment & Plan:  Generalized abdominal pain Assessment & Plan: Presume acute diverticulitis given presentation and symptoms.  No apparent complication at this point.  Treat with Ciprofloxacin 500 mg BID and metronidazole 500 mg TID x 10 days. Discussed to complete entire course.  New referral to GI placed per patient request.  Strict return precautions provided. Will plan on CT scan if symptoms are not improving. Continue pantoprazole 40 mg daily.  Orders: -     Ciprofloxacin HCl; Take 1 tablet (500 mg total) by mouth 2 (two) times daily for 10 days.  Dispense: 20 tablet; Refill: 0 -     metroNIDAZOLE; Take 1 tablet (500 mg total) by mouth 3 (three) times daily for 10 days.  Dispense: 30 tablet; Refill: 0  History of diverticulitis Assessment & Plan: Acute flare presumed today, will treat.  New referral placed to GI per patient request.   Orders: -     Ambulatory referral to Gastroenterology        Doreene Nest, NP

## 2022-09-02 NOTE — Assessment & Plan Note (Signed)
Acute flare presumed today, will treat.  New referral placed to GI per patient request.

## 2022-09-02 NOTE — Patient Instructions (Signed)
Start Cipro antibiotics. Take 1 tablet by mouth twice daily for 10 days.  Start Flagyl antibiotics. Take 1 tablet by mouth three times daily for 10 days.  You will either be contacted via phone regarding your referral to GI, or you may receive a letter on your MyChart portal from our referral team with instructions for scheduling an appointment. Please let us know if you have not been contacted by anyone within two weeks.  Remain on a bland diet.  Continue pantoprazole 40 mg daily for heartburn.  Continue to work on increasing fiber once your flare resolves.   It was a pleasure to see you today!

## 2022-09-02 NOTE — Assessment & Plan Note (Signed)
Presume acute diverticulitis given presentation and symptoms.  No apparent complication at this point.  Treat with Ciprofloxacin 500 mg BID and metronidazole 500 mg TID x 10 days. Discussed to complete entire course.  New referral to GI placed per patient request.  Strict return precautions provided. Will plan on CT scan if symptoms are not improving. Continue pantoprazole 40 mg daily.

## 2022-10-16 ENCOUNTER — Ambulatory Visit: Payer: Managed Care, Other (non HMO) | Admitting: Physician Assistant

## 2022-11-06 ENCOUNTER — Ambulatory Visit: Payer: Managed Care, Other (non HMO) | Admitting: Primary Care

## 2022-11-07 ENCOUNTER — Ambulatory Visit (INDEPENDENT_AMBULATORY_CARE_PROVIDER_SITE_OTHER): Payer: Managed Care, Other (non HMO) | Admitting: Primary Care

## 2022-11-07 ENCOUNTER — Encounter: Payer: Self-pay | Admitting: Primary Care

## 2022-11-07 VITALS — BP 150/80 | HR 110 | Temp 97.5°F | Ht 66.0 in | Wt 173.0 lb

## 2022-11-07 DIAGNOSIS — M5441 Lumbago with sciatica, right side: Secondary | ICD-10-CM

## 2022-11-07 DIAGNOSIS — R42 Dizziness and giddiness: Secondary | ICD-10-CM | POA: Diagnosis not present

## 2022-11-07 DIAGNOSIS — M5412 Radiculopathy, cervical region: Secondary | ICD-10-CM | POA: Diagnosis not present

## 2022-11-07 DIAGNOSIS — R519 Headache, unspecified: Secondary | ICD-10-CM | POA: Diagnosis not present

## 2022-11-07 DIAGNOSIS — I10 Essential (primary) hypertension: Secondary | ICD-10-CM

## 2022-11-07 DIAGNOSIS — G8929 Other chronic pain: Secondary | ICD-10-CM

## 2022-11-07 DIAGNOSIS — Z113 Encounter for screening for infections with a predominantly sexual mode of transmission: Secondary | ICD-10-CM

## 2022-11-07 NOTE — Assessment & Plan Note (Signed)
Above goal today which she attributes to her pain. She will continue to monitor.  Continue amlodipine 10 mg daily.

## 2022-11-07 NOTE — Progress Notes (Signed)
Subjective:    Patient ID: Ashley [name], female    DOB: Aug 17, 1965, 57 y.o.   MRN: [id number]  Dizziness Associated symptoms include abdominal pain, headaches and nausea.    Ashley [name] is a very pleasant 57 y.o. female with a history of recurrent diverticulitis, osteoarthritis, TIA, chronic back pain, chronic neck pain, hypertension, prediabetes, frequent headaches, tinnitus in both ears, dizziness who presents today to discuss dizziness and FMLA.  She would like STD testing.  She has no symptoms but has recently entered a new relationship.  Symptoms are chronic, but over the last 1 month she's noticed increased dizziness, lightheadedness, nausea which have been intermittent. She describes her symptoms "like a darkness and heaviness" She feels her symptoms when she's sitting still looking at her computer screen or TV.  Her symptoms last for less than 20 minutes. She does not notice her symptoms when active or ambulating.   She underwent MR brain in April 2024 which did not show an acute process,   Currently on treatment for acute diverticulitis flare and is taking Augmentin 875-125 mg twice daily per Dr. Norma Fredrickson. She is on day five of treatment.  She is managed on amlodipine 10 mg daily for hypertension. She believes her BP is elevated today because of her ongoing back and neck pain.   Her headaches have returned as of 3 weeks ago, provoked with movement of her neck. She completed an injection of her cervical spine 1 month ago, headaches resolved. She is considering a nerve ablation and will receive a nerve block next week per physical medicine.  She continues to experience chronic back pain, intermittent. She received an injection of her lumbar spine about 2 weeks ago which helped but did not last long. Following with physical medicine.  She is needing intermittent FMLA for ongoing doctors appointments 1-2 days weekly lasting 8 hours each appointment.   BP Readings  from Last 3 Encounters:  11/07/22 (!) 150/80  09/02/22 128/76  08/22/22 (!) 150/101      Review of Systems  Gastrointestinal:  Positive for abdominal pain and nausea.  Neurological:  Positive for dizziness, light-headedness and headaches.         Past Medical History:  Diagnosis Date   Abdominal pain 06/23/2018   Acute breast pain 01/30/2022   Acute midline low back pain without sciatica 09/10/2019   Acute right flank pain 06/29/2021   Adverse effect of drug 08/15/2019   Anemia    Bronchitis    2 years ago    [facility] arthritis 2019   base of left thumb    Constipation 04/07/2019   Discoloration of skin 05/17/2021   Diverticulitis of colon    GERD (gastroesophageal reflux disease)    diet controlled , no meds   Hydrosalpinx    bilateral    Hyperlipidemia    recent dx - currently diet control - no meds   Hypertension    Hypothyroid 2003   radio active iodine - no meds   Left-sided weakness 02/12/2019   Ovarian cyst, left 02/23/2016   Urinary frequency 11/30/2018   Vitamin B 12 deficiency     Social History   Socioeconomic History   Marital status: Single    Spouse name: Not on file   Number of children: Not on file   Years of education: Not on file   Highest education level: Not on file  Occupational History   Not on file  Tobacco Use   Smoking status: Never  Smokeless tobacco: Never  Vaping Use   Vaping status: Never Used  Substance and Sexual Activity   Alcohol use: No   Drug use: No   Sexual activity: Yes    Partners: Male    Birth control/protection: Surgical    Comment: hysterectomy.  Other Topics Concern   Not on file  Social History Narrative   Single.   3 children.   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   Enjoys relaxing, exercise, walking her dog.    Social Determinants of Health   Financial Resource Strain: Not on file  Food Insecurity: Not on file  Transportation Needs: Not on file  Physical Activity: Not on file  Stress: Not on file   Social Connections: Not on file  Intimate Partner Violence: Not on file    Past Surgical History:  Procedure Laterality Date   CESAREAN SECTION     x 2   CHOLECYSTECTOMY  2008   COLONOSCOPY WITH PROPOFOL N/A 08/06/2021   Procedure: COLONOSCOPY WITH PROPOFOL;  Surgeon: Midge Minium, MD;  Location: [facility];  Service: Endoscopy;  Laterality: N/A;   DILATION AND CURETTAGE OF UTERUS     X 2   ESOPHAGOGASTRODUODENOSCOPY (EGD) WITH PROPOFOL N/A 08/06/2021   Procedure: ESOPHAGOGASTRODUODENOSCOPY (EGD) WITH PROPOFOL;  Surgeon: Midge Minium, MD;  Location: [facility];  Service: Endoscopy;  Laterality: N/A;   LAPAROSCOPIC BILATERAL SALPINGECTOMY Bilateral 07/29/2017   Procedure: LAPAROSCOPIC LEFT SALPINGOOPHRECTOMY: Partial right Salpingectomy;  Surgeon: Willodean Rosenthal, MD;  Location: [facility];  Service: Gynecology;  Laterality: Bilateral;   POLYPECTOMY  08/06/2021   Procedure: POLYPECTOMY;  Surgeon: Midge Minium, MD;  Location: [facility];  Service: Endoscopy;;   SHOULDER SURGERY  2016   arthroscopic   TOTAL ABDOMINAL HYSTERECTOMY  2006   Fibroids, benign pathology   TUBAL LIGATION     WISDOM TOOTH EXTRACTION     at age 34 yr   WRIST ARTHROSCOPY Right 2007    Family History  Problem Relation Age of Onset   Hypertension Mother    Ovarian cancer Mother    Other Father        unknown medical history   Hypertension Maternal Grandmother    Heart failure Maternal Grandmother    Lung cancer Maternal Grandmother    Breast cancer Maternal Aunt    Colon cancer Neg Hx    Colon polyps Neg Hx    Rectal cancer Neg Hx    Stomach cancer Neg Hx     Allergies  Allergen Reactions   Aczone [Dapsone] Swelling   Levaquin [Levofloxacin]     Presumed cause of joint aches    Current Outpatient Medications on File Prior to Visit  Medication Sig Dispense Refill   amLODipine (NORVASC) 10 MG tablet Take 10 mg by mouth daily.      amoxicillin-clavulanate (AUGMENTIN) 875-125 MG tablet Take by mouth.     aspirin 81 MG EC tablet Take by mouth.     atorvastatin (LIPITOR) 10 MG tablet Take 1 tablet (10 mg total) by mouth daily. for cholesterol. 90 tablet 3   azelastine (ASTELIN) 0.1 % nasal spray Place into the nose.     fluticasone (FLONASE) 50 MCG/ACT nasal spray Place 1 spray into both nostrils 2 (two) times daily. 16 g 2   gabapentin (NEURONTIN) 600 MG tablet Take 1,200 mg by mouth daily.     pantoprazole (PROTONIX) 40 MG tablet Take 1 tablet (40 mg total) by mouth 2 (two) times daily. 60 tablet  1   venlafaxine XR (EFFEXOR-XR) 75 MG 24 hr capsule Take 1 capsule (75 mg total) by mouth daily. 60 capsule 3   No current facility-administered medications on file prior to visit.    BP (!) 150/80   Pulse (!) 110   Temp (!) 97.5 F (36.4 C) (Temporal)   Ht 5\' 6"  (1.676 m)   Wt 173 lb (78.5 kg)   SpO2 100%   BMI 27.92 kg/m  Objective:   Physical Exam Cardiovascular:     Rate and Rhythm: Normal rate and regular rhythm.  Pulmonary:     Effort: Pulmonary effort is normal.     Breath sounds: Normal breath sounds.  Abdominal:     General: Bowel sounds are normal.     Palpations: Abdomen is soft.  Musculoskeletal:     Cervical back: Neck supple.  Skin:    General: Skin is warm and dry.           Assessment & Plan:  Frequent headaches Assessment & Plan: Deteriorated. Seem to be provoked by cervical spine.  Continue follow-up with physical medicine.  Agree to provide intermittent FMLA for multiple doctors appointments.  Paperwork completed today and handed to patient.  Orders: -     CT HEAD WO CONTRAST ( ); Future  Dizziness Assessment & Plan: Unclear etiology.  She does have a lot going on healthwise. Given history of TIA, CT head without contrast ordered and pending.  We did discuss bluelight glasses when looking at screens. Closely monitor blood pressure.    Orders: -     CT HEAD WO  CONTRAST ( ); Future  Cervical radiculitis Assessment & Plan: Continued.  Could be trigger for headaches. Following with physical medicine, reviewed office notes from September 2024.    Screening examination for STI Assessment & Plan: Asymptomatic.  STD labs ordered and pending.  Orders: -     RPR -     Hepatitis C antibody -     Herpes Simplex Virus 1 and 2 (IgG), with Reflex to HSV-2 Inhibition -     HIV Antibody (routine testing w rflx) -     C. trachomatis/N. gonorrhoeae RNA -     Trichomonas vaginalis, RNA  Essential hypertension Assessment & Plan: Above goal today which she attributes to her pain. She will continue to monitor.  Continue amlodipine 10 mg daily.   Chronic bilateral low back pain with right-sided sciatica Assessment & Plan: Following with physical medicine. Continue injections.         Doreene Nest, NP

## 2022-11-07 NOTE — Assessment & Plan Note (Addendum)
Continued.  Could be trigger for headaches. Following with physical medicine, reviewed office notes from September 2024.

## 2022-11-07 NOTE — Assessment & Plan Note (Signed)
Unclear etiology.  She does have a lot going on healthwise. Given history of TIA, CT head without contrast ordered and pending.  We did discuss bluelight glasses when looking at screens. Closely monitor blood pressure.

## 2022-11-07 NOTE — Assessment & Plan Note (Signed)
Deteriorated. Seem to be provoked by cervical spine.  Continue follow-up with physical medicine.  Agree to provide intermittent FMLA for multiple doctors appointments.  Paperwork completed today and handed to patient.

## 2022-11-07 NOTE — Assessment & Plan Note (Signed)
Asymptomatic.  STD labs ordered and pending.

## 2022-11-07 NOTE — Assessment & Plan Note (Signed)
Following with physical medicine. Continue injections.

## 2022-11-07 NOTE — Patient Instructions (Addendum)
You will either be contacted via phone regarding your CT scan, or you may receive a letter on your MyChart portal from our referral team with instructions for scheduling an appointment. Please let us know if you have not been contacted by anyone within two weeks.  Stop by the lab prior to leaving today. I will notify you of your results once received.   It was a pleasure to see you today!

## 2022-11-12 ENCOUNTER — Encounter: Payer: Self-pay | Admitting: *Deleted

## 2022-11-14 LAB — C. TRACHOMATIS/N. GONORRHOEAE RNA
C. trachomatis RNA, TMA: NOT DETECTED
N. gonorrhoeae RNA, TMA: NOT DETECTED

## 2022-11-14 LAB — TRICHOMONAS VAGINALIS, PROBE AMP: Trichomonas vaginalis RNA: NOT DETECTED

## 2022-11-14 LAB — HERPES SIMPLEX VIRUS 1 AND 2 (IGG),REFLEX HSV-2 INHIBITION
HAV 1 IGG,TYPE SPECIFIC AB: 3.61 index — ABNORMAL HIGH
HSV 2 IGG,TYPE SPECIFIC AB: >23 index — ABNORMAL HIGH

## 2022-11-14 LAB — HEPATITIS C ANTIBODY: Hepatitis C Ab: NONREACTIVE

## 2022-11-14 LAB — HSV 2 INHIBITION: HSV 2 IGG INHIBITION,IA: POSITIVE — AB

## 2022-11-14 LAB — HIV ANTIBODY (ROUTINE TESTING W REFLEX): HIV 1&2 Ab, 4th Generation: NONREACTIVE

## 2022-11-14 LAB — RPR: RPR Ser Ql: NONREACTIVE

## 2022-11-21 ENCOUNTER — Telehealth: Payer: Self-pay | Admitting: Primary Care

## 2022-11-21 NOTE — Telephone Encounter (Signed)
Called and verified with patient the order does need to go to [facility]. Faxed order through Epic.

## 2022-11-21 NOTE — Telephone Encounter (Signed)
[employer] called stating [facility] is asking if Ashley [name] can fax over the order for the pt's CT scan w/o contrast to their office? Fax # is [phone]. Call back # [phone]

## 2022-11-25 ENCOUNTER — Ambulatory Visit: Payer: Managed Care, Other (non HMO)

## 2022-11-27 ENCOUNTER — Encounter: Payer: Self-pay | Admitting: Primary Care

## 2022-11-29 ENCOUNTER — Emergency Department
Admission: EM | Admit: 2022-11-29 | Discharge: 2022-11-30 | Disposition: A | Discharge status: Home / Self Care | Payer: Managed Care, Other (non HMO) | Attending: Emergency Medicine | Admitting: Emergency Medicine

## 2022-11-29 ENCOUNTER — Emergency Department: Payer: Managed Care, Other (non HMO)

## 2022-11-29 ENCOUNTER — Other Ambulatory Visit: Payer: Self-pay

## 2022-11-29 DIAGNOSIS — K5732 Diverticulitis of large intestine without perforation or abscess without bleeding: Secondary | ICD-10-CM | POA: Insufficient documentation

## 2022-11-29 DIAGNOSIS — K5792 Diverticulitis of intestine, part unspecified, without perforation or abscess without bleeding: Secondary | ICD-10-CM

## 2022-11-29 DIAGNOSIS — R1030 Lower abdominal pain, unspecified: Secondary | ICD-10-CM | POA: Diagnosis present

## 2022-11-29 LAB — CBC
HCT: 40.3 % (ref 36.0–46.0)
Hemoglobin: 13.3 g/dL (ref 12.0–15.0)
MCH: 27.2 pg (ref 26.0–34.0)
MCHC: 33 g/dL (ref 30.0–36.0)
MCV: 82.4 fL (ref 80.0–100.0)
Platelets: 280 10*3/uL (ref 150–400)
RBC: 4.89 MIL/uL (ref 3.87–5.11)
RDW: 15.5 % (ref 11.5–15.5)
WBC: 5.9 10*3/uL (ref 4.0–10.5)
nRBC: 0 % (ref 0.0–0.2)

## 2022-11-29 LAB — LIPASE, BLOOD: Lipase: 24 U/L (ref 11–51)

## 2022-11-29 LAB — COMPREHENSIVE METABOLIC PANEL
ALT: 15 U/L (ref 0–44)
AST: 24 U/L (ref 15–41)
Albumin: 4.1 g/dL (ref 3.5–5.0)
Alkaline Phosphatase: 85 U/L (ref 38–126)
Anion gap: 11 (ref 5–15)
BUN: 11 mg/dL (ref 6–20)
CO2: 25 mmol/L (ref 22–32)
Calcium: 9.8 mg/dL (ref 8.9–10.3)
Chloride: 102 mmol/L (ref 98–111)
Creatinine, Ser: 0.6 mg/dL (ref 0.44–1.00)
GFR, Estimated: >60 mL/min (ref >60)
Glucose, Bld: 89 mg/dL (ref 70–99)
Potassium: 4 mmol/L (ref 3.5–5.1)
Sodium: 138 mmol/L (ref 135–145)
Total Bilirubin: 0.6 mg/dL (ref 0.3–1.2)
Total Protein: 7.9 g/dL (ref 6.5–8.1)

## 2022-11-29 LAB — URINALYSIS, ROUTINE W REFLEX MICROSCOPIC
Bilirubin Urine: NEGATIVE
Glucose, UA: NEGATIVE mg/dL
Hgb urine dipstick: NEGATIVE
Ketones, ur: NEGATIVE mg/dL
Leukocytes,Ua: NEGATIVE
Nitrite: NEGATIVE
Protein, ur: NEGATIVE mg/dL
Specific Gravity, Urine: 1.006 (ref 1.005–1.030)
pH: 6 (ref 5.0–8.0)

## 2022-11-29 MED ORDER — SENNA 8.6 MG PO TABS: 1.0000 | ORAL_TABLET | Freq: Every day | ORAL | 0 refills | Status: AC

## 2022-11-29 MED ORDER — METRONIDAZOLE 500 MG PO TABS: 500.0000 mg | ORAL_TABLET | Freq: Two times a day (BID) | ORAL | 0 refills | Status: DC

## 2022-11-29 MED ORDER — METRONIDAZOLE 500 MG/100ML IV SOLN
500.0000 mg | Freq: Once | INTRAVENOUS | Status: AC
  Administered 2022-11-29: 500 mg via INTRAVENOUS
  Filled 2022-11-29: qty 100

## 2022-11-29 MED ORDER — CIPROFLOXACIN HCL 500 MG PO TABS: 500.0000 mg | ORAL_TABLET | Freq: Two times a day (BID) | ORAL | 0 refills | Status: AC

## 2022-11-29 MED ORDER — IOHEXOL 300 MG/ML  SOLN
100.0000 mL | Freq: Once | INTRAMUSCULAR | Status: AC | PRN
  Administered 2022-11-29: 100 mL via INTRAVENOUS

## 2022-11-29 MED ORDER — CIPROFLOXACIN IN D5W 200 MG/100ML IV SOLN
200.0000 mg | Freq: Once | INTRAVENOUS | Status: AC
  Administered 2022-11-29: 200 mg via INTRAVENOUS
  Filled 2022-11-29: qty 100

## 2022-11-29 NOTE — ED Notes (Signed)
Patient transported to CT 

## 2022-11-29 NOTE — ED Triage Notes (Addendum)
Pt comes with c/o mid lower belly pain that started yesterday. Pt states some nausea. Pt states more frequent urination and some pain. Pt states 3 BM today with 1st being normal then the other two were diarrhea.

## 2022-11-29 NOTE — ED Provider Notes (Signed)
[facility] Provider Note  Patient Contact: 5:51 PM (approximate)   History   Abdominal Pain  HPI  Ashley [name] is a 57 y.o. female who presents the emergency department with sharp lower abdominal pain.  Patient has a history of diverticulitis, states that her pain is typically originating in her pelvis when she has a flare.  Patient was seen roughly a month ago and did have acute uncomplicated diverticulitis.  Patient has had a return of pain that is worse than her previous episodes.  No fevers, chills.  Patient has had some diarrhea.  Denies any blood.  Patient denies any urinary changes.     Physical Exam   Triage Vital Signs: ED Triage Vitals [11/29/22 1309]  Encounter Vitals Group     BP (!) 146/98     Systolic BP Percentile      Diastolic BP Percentile      Pulse Rate 87     Resp 19     Temp 98 F (36.7 C)     Temp src      SpO2 100 %     Weight 173 lb (78.5 kg)     Height 5\' 6"  (1.676 m)     Head Circumference      Peak Flow      Pain Score 8     Pain Loc      Pain Education      Exclude from Growth Chart     Most recent vital signs: Vitals:   11/29/22 1927 11/29/22 2250  BP:  118/66  Pulse:  83  Resp:  17  Temp: 98.6 F (37 C)   SpO2:  100%     General: Alert and in no acute distress.  Cardiovascular:  Good peripheral perfusion Respiratory: Normal respiratory effort without tachypnea or retractions. Lungs CTAB.  Gastrointestinal: Bowel sounds 4 quadrants.  Soft to palpation but diffusely tender along the lower abdomen.  Positive guarding.. No  rigidity. No palpable masses. No distention. No CVA tenderness. Musculoskeletal: Full range of motion to all extremities.  Neurologic:  No gross focal neurologic deficits are appreciated.  Skin:   No rash noted Other:   ED Results / Procedures / Treatments   Labs (all labs ordered are listed, but only abnormal results are displayed) Labs Reviewed  URINALYSIS, ROUTINE W  REFLEX MICROSCOPIC - Abnormal; Notable for the following components:      Result Value   Color, Urine YELLOW (*)    APPearance CLEAR (*)    All other components within normal limits  LIPASE, BLOOD  COMPREHENSIVE METABOLIC PANEL  CBC     EKG     RADIOLOGY  I personally viewed, evaluated, and interpreted these images as part of my medical decision making, as well as reviewing the written report by the radiologist.  ED Provider Interpretation: Findings consistent with diverticulitis without evidence of abscess or perforation.  CT ABDOMEN PELVIS W CONTRAST  Result Date: 11/29/2022 CLINICAL DATA:  Abdominal pain, acute, nonlocalized Pt comes with c/o mid lower belly pain that started yesterday. Pt states some nausea. Pt states more frequent urination and some pain. Pt states 3 BM today with 1st being normal then the other two were diarrhea. EXAM: CT ABDOMEN AND PELVIS WITH CONTRAST TECHNIQUE: Multidetector CT imaging of the abdomen and pelvis was performed using the standard protocol following bolus administration of intravenous contrast. RADIATION DOSE REDUCTION: This exam was performed according to the departmental dose-optimization program which includes automated exposure control,  adjustment of the mA and/or kV according to patient size and/or use of iterative reconstruction technique. CONTRAST:  OMNIPAQUE IOHEXOL 300 MG/ML  SOLN COMPARISON:  CT renal 06/29/2021 FINDINGS: Lower chest: No acute abnormality. Hepatobiliary: No focal liver abnormality. Status post cholecystectomy. No biliary dilatation. Pancreas: No focal lesion. Normal pancreatic contour. No surrounding inflammatory changes. No main pancreatic ductal dilatation. Spleen: Normal in size without focal abnormality. Adrenals/Urinary Tract: No adrenal nodule bilaterally. Bilateral kidneys enhance symmetrically. No hydronephrosis. No hydroureter. The urinary bladder is unremarkable. On delayed imaging, there is no urothelial wall  thickening and there are no filling defects in the opacified portions of the bilateral collecting systems or ureters. Stomach/Bowel: Stomach is within normal limits. No evidence of bowel wall thickening or dilatation. Long segment of sigmoid colon demonstrating bowel wall thickening and pericolonic fat stranding most prominent along a focal diverticula (2:69). Colonic diverticulosis. No intramural abscess formation. Appendix appears normal. Vascular/Lymphatic: No abdominal aorta or iliac aneurysm. Mild atherosclerotic plaque of the aorta and its branches. No abdominal, pelvic, or inguinal lymphadenopathy. Reproductive: Status post hysterectomy. No adnexal masses. Other: Small volume simple free pelvic fluid. No intraperitoneal free gas. No organized fluid collection. Musculoskeletal: Tiny fat containing umbilical hernia. No suspicious lytic or blastic osseous lesions. No acute displaced fracture. IMPRESSION: 1. Colonic diverticulosis with uncomplicated sigmoid acute diverticulitis. Recommend colonoscopy status post treatment and status post complete resolution of inflammatory changes to exclude an underlying lesion. 2. Small volume simple free fluid pelvic ascites. 3. Status post cholecystectomy and hysterectomy. Electronically Signed   By: Tish Frederickson M.D.   On: 11/29/2022 20:52    PROCEDURES:  Critical Care performed: No  Procedures   MEDICATIONS ORDERED IN ED: Medications  ciprofloxacin (CIPRO) IVPB 200 mg (has no administration in time range)  metroNIDAZOLE (FLAGYL) IVPB 500 mg (500 mg Intravenous New Bag/Given 11/29/22 2249)  iohexol (OMNIPAQUE) 300 MG/ML solution 100 mL (100 mLs Intravenous Contrast Given 11/29/22 1830)     IMPRESSION / MDM / ASSESSMENT AND PLAN / ED COURSE  I reviewed the triage vital signs and the nursing notes.                                 Differential diagnosis includes, but is not limited to, colitis, diverticulitis, UTI, pyelonephritis,  nephrolithiasis   Patient's presentation is most consistent with acute presentation with potential threat to life or bodily function.   Patient's diagnosis is consistent with diverticulitis.  Patient presents with lower abdominal pain.  History of diverticulitis.  Patient states that she has had no blood in in her stool.  She did have lower pelvic pain which is her typical area of symptoms.  Negative urinalysis, labs are overall reassuring.  Given the tenderness, patient had CT scan which reveals diverticulitis without evidence of perforation or abscess.  Antibiotics administered here in the emergency department.  Will prescribe Cipro and Flagyl for the patient at home.  Recommend following up with GI for colonoscopy after symptoms have resolved..  Patient is given ED precautions to return to the ED for any worsening or new symptoms.     FINAL CLINICAL IMPRESSION(S) / ED DIAGNOSES   Final diagnoses:  Diverticulitis     Rx / DC Orders   ED Discharge Orders          Ordered    ciprofloxacin (CIPRO) 500 MG tablet  2 times daily        11/29/22 2255  metroNIDAZOLE (FLAGYL) 500 MG tablet  2 times daily        11/29/22 2255             Note:  This document was prepared using Dragon voice recognition software and may include unintentional dictation errors.   Racheal Patches, PA-C 11/29/22 2255    Dionne Bucy, MD 12/01/22 [phone]

## 2022-12-02 ENCOUNTER — Telehealth: Payer: Self-pay

## 2022-12-02 NOTE — Transitions of Care (Post Inpatient/ED Visit) (Unsigned)
I spoke with pt; pt seen [facility] ED on 11/30/22; pt had lower abd pain and was dx with diverticvulitis; pt is taking abxs and will finish both meds. Pt is feeling better than when seen in ED.  pt said still slight swelling underskin where had IV; no redness or pain. offered pt appt to FU but pt said will give few more days and if not better pt will call [facility] for appt. Pt is avoiding nuts, seeds  and fresh fruit and vegetables that are raw. Sending note to Allayne Gitelman NP.   Name: Ashley [name] MRN: [id number] DOB: May 03, 1965  Today's TOC FU Call Status: Today's TOC FU Call Status:: Successful TOC FU Call Completed TOC FU Call Complete Date: 12/02/22 Patient's Name and Date of Birth confirmed.  Transition Care Management Follow-up Telephone Call Date of Discharge: 11/30/22 Discharge Facility: [facility] Type of Discharge: Emergency Department Reason for ED Visit: Other: (pt seen [facility] ED on 11/30/22 with lower abd pain dx with diverticulitis) How have you been since you were released from the hospital?: Better Any questions or concerns?: Yes Patient Questions/Concerns:: pt said still slight swelling underskin where had IV; no redfness orpain. offered pt appt to FU but pt said will give few more days and if not better pt will call [facility] for appt. Patient Questions/Concerns Addressed: Notified Provider of Patient Questions/Concerns  Items Reviewed: Did you receive and understand the discharge instructions provided?: Yes Medications obtained,verified, and reconciled?: Yes (Medications Reviewed) Any new allergies since your discharge?: No Dietary orders reviewed?: Yes Type of Diet Ordered:: not to eat nuts or raw vegetables oir fruits Do you have support at home?: Yes People in Home: child(ren), adult Name of Support/Comfort Primary Source: Darrell  Medications Reviewed Today: Medications Reviewed Today   Medications were not reviewed in this encounter      Home Care and Equipment/Supplies: Were Home Health Services Ordered?: NA Any new equipment or medical supplies ordered?: NA  Functional Questionnaire: Do you need assistance with bathing/showering or dressing?: No Do you need assistance with meal preparation?: No Do you need assistance with eating?: No Do you have difficulty maintaining continence: No Do you need assistance with getting out of bed/getting out of a chair/moving?: No Do you have difficulty managing or taking your medications?: No  Follow up appointments reviewed: Specialist Hospital Follow-up appointment confirmed?: Yes Date of Specialist follow-up appointment?:  (pt does nothave date with her now;pt knows in Feb 20235 and will be on wait list also.) Follow-Up Specialty Provider:: [facility] Do you need transportation to your follow-up appointment?: No Do you understand care options if your condition(s) worsen?: Yes-patient verbalized understanding    SIGNATURE Lewanda Rife, LPN

## 2022-12-03 NOTE — Telephone Encounter (Signed)
Noted  

## 2022-12-03 NOTE — Telephone Encounter (Signed)
Patient returned call.I relayed Ashley [name] message to her,and she said when she spoke with Ashley [name] ,she let her know that she already contacted her GI doctor and she has an appointment with them soon.

## 2022-12-03 NOTE — Telephone Encounter (Signed)
Unable to reach patient. Left voicemail to return call to our office.   

## 2022-12-03 NOTE — Telephone Encounter (Signed)
Noted. Please let her know that I recommend that she contact her GI doctor to let them know that she was in the ED again for diverticulitis.

## 2022-12-12 ENCOUNTER — Encounter: Payer: Self-pay | Admitting: Primary Care

## 2022-12-12 ENCOUNTER — Ambulatory Visit: Payer: Managed Care, Other (non HMO) | Admitting: Primary Care

## 2022-12-12 VITALS — BP 124/80 | HR 86 | Temp 98.3°F | Ht 66.0 in | Wt 173.0 lb

## 2022-12-12 DIAGNOSIS — M79602 Pain in left arm: Secondary | ICD-10-CM | POA: Diagnosis not present

## 2022-12-12 NOTE — Patient Instructions (Signed)
Medications such as Ibuprofen or naproxen can help with the pain and swelling.  You can try a compression sock to the site.  Please update me in 1 week for no reduction in swelling or if you develop new symptoms.  It was a pleasure to see you today!

## 2022-12-12 NOTE — Assessment & Plan Note (Signed)
Exam and HPI representative of effects from IV infiltration/phlebitis.  No reason to suspect blood clot at this point. No infection present.  Discussed treatment with antiinflammatories, compression, etc. She will update in 1 week if there has been no improvement.  Consider venous US.

## 2022-12-12 NOTE — Progress Notes (Signed)
Subjective:    Patient ID: Ashley [name], female    DOB: 21-Jan-1966, 57 y.o.   MRN: [id number]  HPI  Ashley [name] is a very pleasant 57 y.o. female with a history of recurrent diverticulitis, osteoarthritis, foraminal stenosis of cervical spine, impingement syndrome of bilateral shoulders, TIA, chronic low back pain, frequent headaches who presents today to discuss upper extremity pain.  Evaluated at [facility] ED on 11/29/2022 for abdominal pain.  Was found to have acute diverticulitis.  She was treated with IV ciprofloxacin and metronidazole and discharged home with prescriptions for continuation.  Since her ED visit the IV site to the left lateral forearm has become tender. One week ago she began noticing swelling and bruising distal to the left antecubital region. She's also noticed radiating pain from the left lateral forearm up distal to the left antecubital region.   During her CT abdomen/pelvis she felt an immediate burning sensation to the IV site when they injected the IV contrast. After the CT scan she received IV metronidazole, IV Cipro, and IV fluids. She believes the CT technician had to use a smaller gauge needle to the IV site (left lateral forearm) because of her hydration.   Since her swelling and symptoms began 1 week ago, she's not noticed improvement.    Review of Systems  Constitutional:  Negative for fever.  Skin:  Positive for color change.       Soft tissue swelling  Neurological:  Positive for numbness.         Past Medical History:  Diagnosis Date   Abdominal pain 06/23/2018   Acute breast pain 01/30/2022   Acute midline low back pain without sciatica 09/10/2019   Acute right flank pain 06/29/2021   Adverse effect of drug 08/15/2019   Anemia    Bronchitis    2 years ago    [facility] arthritis 2019   base of left thumb    Constipation 04/07/2019   Discoloration of skin 05/17/2021   Diverticulitis of colon    GERD (gastroesophageal reflux  disease)    diet controlled , no meds   Hydrosalpinx    bilateral    Hyperlipidemia    recent dx - currently diet control - no meds   Hypertension    Hypothyroid 2003   radio active iodine - no meds   Left-sided weakness 02/12/2019   Ovarian cyst, left 02/23/2016   Urinary frequency 11/30/2018   Vitamin B 12 deficiency     Social History   Socioeconomic History   Marital status: Single    Spouse name: Not on file   Number of children: Not on file   Years of education: Not on file   Highest education level: Not on file  Occupational History   Not on file  Tobacco Use   Smoking status: Never   Smokeless tobacco: Never  Vaping Use   Vaping status: Never Used  Substance and Sexual Activity   Alcohol use: No   Drug use: No   Sexual activity: Yes    Partners: Male    Birth control/protection: Surgical    Comment: hysterectomy.  Other Topics Concern   Not on file  Social History Narrative   Single.   3 children.   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   Enjoys relaxing, exercise, walking her dog.    Social Determinants of Health   Financial Resource Strain: Not on file  Food Insecurity: Not on file  Transportation Needs: Not on file  Physical Activity: Not on file  Stress: Not on file  Social Connections: Unknown (11/21/2022)   Received from [facility]   Social Network    Social Network: Not on file  Intimate Partner Violence: Unknown (11/21/2022)   Received from [facility]   HITS    Physically Hurt: Not on file    Insult or Talk Down To: Not on file    Threaten Physical Harm: Not on file    Scream or Curse: Not on file    Past Surgical History:  Procedure Laterality Date   CESAREAN SECTION     x 2   CHOLECYSTECTOMY  2008   COLONOSCOPY WITH PROPOFOL N/A 08/06/2021   Procedure: COLONOSCOPY WITH PROPOFOL;  Surgeon: Midge Minium, MD;  Location: [facility];  Service: Endoscopy;  Laterality: N/A;   DILATION AND CURETTAGE OF UTERUS     X 2    ESOPHAGOGASTRODUODENOSCOPY (EGD) WITH PROPOFOL N/A 08/06/2021   Procedure: ESOPHAGOGASTRODUODENOSCOPY (EGD) WITH PROPOFOL;  Surgeon: Midge Minium, MD;  Location: [facility];  Service: Endoscopy;  Laterality: N/A;   LAPAROSCOPIC BILATERAL SALPINGECTOMY Bilateral 07/29/2017   Procedure: LAPAROSCOPIC LEFT SALPINGOOPHRECTOMY: Partial right Salpingectomy;  Surgeon: Willodean Rosenthal, MD;  Location: [facility];  Service: Gynecology;  Laterality: Bilateral;   POLYPECTOMY  08/06/2021   Procedure: POLYPECTOMY;  Surgeon: Midge Minium, MD;  Location: [facility];  Service: Endoscopy;;   SHOULDER SURGERY  2016   arthroscopic   TOTAL ABDOMINAL HYSTERECTOMY  2006   Fibroids, benign pathology   TUBAL LIGATION     WISDOM TOOTH EXTRACTION     at age 89 yr   WRIST ARTHROSCOPY Right 2007    Family History  Problem Relation Age of Onset   Hypertension Mother    Ovarian cancer Mother    Other Father        unknown medical history   Hypertension Maternal Grandmother    Heart failure Maternal Grandmother    Lung cancer Maternal Grandmother    Breast cancer Maternal Aunt    Colon cancer Neg Hx    Colon polyps Neg Hx    Rectal cancer Neg Hx    Stomach cancer Neg Hx     Allergies  Allergen Reactions   Aczone [Dapsone] Swelling   Levaquin [Levofloxacin]     Presumed cause of joint aches    Current Outpatient Medications on File Prior to Visit  Medication Sig Dispense Refill   amLODipine (NORVASC) 10 MG tablet Take 10 mg by mouth daily.     aspirin 81 MG EC tablet Take by mouth.     atorvastatin (LIPITOR) 10 MG tablet Take 1 tablet (10 mg total) by mouth daily. for cholesterol. 90 tablet 3   azelastine (ASTELIN) 0.1 % nasal spray Place into the nose.     fluticasone (FLONASE) 50 MCG/ACT nasal spray Place 1 spray into both nostrils 2 (two) times daily. 16 g 2   gabapentin (NEURONTIN) 600 MG tablet Take 1,200 mg by mouth daily.     metroNIDAZOLE (FLAGYL) 500 MG  tablet Take 1 tablet (500 mg total) by mouth 2 (two) times daily. 14 tablet 0   pantoprazole (PROTONIX) 40 MG tablet Take 1 tablet (40 mg total) by mouth 2 (two) times daily. 60 tablet 1   senna (SENOKOT) 8.6 MG TABS tablet Take 1 tablet (8.6 mg total) by mouth daily. 30 tablet 0   No current facility-administered medications on file prior to visit.    BP 124/80 (BP Location: Right Arm,  Patient Position: Sitting, Cuff Size: Large)   Pulse 86   Temp 98.3 F (36.8 C) (Oral)   Ht 5\' 6"  (1.676 m)   Wt 173 lb (78.5 kg)   SpO2 99%   BMI 27.92 kg/m  Objective:   Physical Exam Constitutional:      General: She is not in acute distress. Cardiovascular:     Rate and Rhythm: Normal rate.     Pulses:          Radial pulses are 2+ on the left side.  Skin:    General: Skin is warm and dry.     Findings: No erythema.     Comments: Mild soft tissue swelling with mild bruising to anterior left upper extremity distal to antecubital region. Tender.            Assessment & Plan:  Upper extremity pain, anterior, left Assessment & Plan: Exam and HPI representative of effects from IV infiltration/phlebitis.  No reason to suspect blood clot at this point. No infection present.  Discussed treatment with antiinflammatories, compression, etc. She will update in 1 week if there has been no improvement.  Consider venous US.         Doreene Nest, NP

## 2022-12-20 DIAGNOSIS — A609 Anogenital herpesviral infection, unspecified: Secondary | ICD-10-CM

## 2022-12-20 MED ORDER — VALACYCLOVIR HCL 1 G PO TABS
ORAL_TABLET | ORAL | 0 refills | Status: DC
Start: 2022-12-20 — End: 2023-03-06

## 2022-12-20 NOTE — Telephone Encounter (Signed)
Per your note on 06/26/21:  Infrequent outbreaks.   Continue Valtrex 1000 mg PRN.

## 2023-01-08 DIAGNOSIS — K219 Gastro-esophageal reflux disease without esophagitis: Secondary | ICD-10-CM

## 2023-01-08 DIAGNOSIS — E785 Hyperlipidemia, unspecified: Secondary | ICD-10-CM

## 2023-01-08 MED ORDER — ATORVASTATIN CALCIUM 10 MG PO TABS: 10.0000 mg | ORAL_TABLET | Freq: Every day | ORAL | 0 refills | Status: DC

## 2023-01-08 MED ORDER — PANTOPRAZOLE SODIUM 40 MG PO TBEC
40.0000 mg | DELAYED_RELEASE_TABLET | Freq: Two times a day (BID) | ORAL | 0 refills | Status: DC
Start: 2023-01-08 — End: 2023-06-20

## 2023-01-20 ENCOUNTER — Other Ambulatory Visit (HOSPITAL_COMMUNITY): Payer: Self-pay

## 2023-01-28 ENCOUNTER — Telehealth: Payer: Self-pay

## 2023-01-28 ENCOUNTER — Other Ambulatory Visit (HOSPITAL_COMMUNITY): Payer: Self-pay

## 2023-01-28 NOTE — Telephone Encounter (Signed)
Ashley [name], can you check on the PA for her pantoprazole heartburn medication?

## 2023-01-28 NOTE — Telephone Encounter (Signed)
*  Primary  Pharmacy Patient Advocate Encounter   Received notification from CoverMyMeds that prior authorization for Pantoprazole Sodium 40MG  dr tablets  is required/requested.   Insurance verification completed.   The patient is insured through [employer] .   Per test claim: PA required; PA submitted to above mentioned insurance via CoverMyMeds Key/confirmation #/EOC [id number] Status is pending

## 2023-01-31 NOTE — Telephone Encounter (Signed)
Pharmacy Patient Advocate Encounter  Received notification from [employer] that Prior Authorization for Pantoprazole has been APPROVED from 12/003/2024 to 01/28/2024   PA #/Case ID/Reference #: [id number]

## 2023-01-31 NOTE — Telephone Encounter (Signed)
PA has been approved and documented in separate telephone encounter dated 01/28/2023. Thank you

## 2023-02-07 ENCOUNTER — Ambulatory Visit: Payer: Managed Care, Other (non HMO)

## 2023-02-07 DIAGNOSIS — K31A11 Gastric intestinal metaplasia without dysplasia, involving the antrum: Secondary | ICD-10-CM

## 2023-02-07 DIAGNOSIS — K3189 Other diseases of stomach and duodenum: Secondary | ICD-10-CM

## 2023-02-10 ENCOUNTER — Encounter: Payer: Self-pay | Admitting: *Deleted

## 2023-02-17 NOTE — Telephone Encounter (Signed)
Printed papwerwork and placed in [place] inbox to complete upon return.   This paperwork cannot be faxed it has to be picked up, spoke with patient she plans to pick up ppw early on 12/26 to still have time to submit.

## 2023-02-18 NOTE — Telephone Encounter (Signed)
Completed paperwork and placed in Kelli's inbox. 

## 2023-02-20 ENCOUNTER — Telehealth: Payer: Self-pay | Admitting: Primary Care

## 2023-02-20 NOTE — Telephone Encounter (Signed)
Type of forms received: handicap parking placard  Routed to: [employer]   Paperwork received by : Audree Camel    Individual made aware of 3-5 business day turn around (Y/N): Y   Form completed and patient made aware of charges(Y/N): Y    Faxed to : pt requested ppw be uploaded to [employer]. If not, pt states she'll pick up ppw tomorrow.   Form location:  ppw handed to [facility].

## 2023-02-20 NOTE — Telephone Encounter (Signed)
Completed and placed in Kelli's inbox. 

## 2023-02-20 NOTE — Telephone Encounter (Signed)
Form placed up front with reception.  Patient notified.

## 2023-02-24 NOTE — Telephone Encounter (Signed)
Patient picked up ppwk.

## 2023-03-06 ENCOUNTER — Encounter: Payer: Self-pay | Admitting: Obstetrics & Gynecology

## 2023-03-06 ENCOUNTER — Ambulatory Visit (INDEPENDENT_AMBULATORY_CARE_PROVIDER_SITE_OTHER): Payer: Managed Care, Other (non HMO) | Admitting: Obstetrics & Gynecology

## 2023-03-06 ENCOUNTER — Other Ambulatory Visit (HOSPITAL_COMMUNITY)
Admission: RE | Admit: 2023-03-06 | Discharge: 2023-03-06 | Disposition: A | Discharge status: Home / Self Care | Payer: Managed Care, Other (non HMO) | Source: Ambulatory Visit | Attending: Obstetrics & Gynecology | Admitting: Obstetrics & Gynecology

## 2023-03-06 VITALS — BP 176/106 | HR 85 | Ht 66.0 in | Wt 172.0 lb

## 2023-03-06 DIAGNOSIS — N898 Other specified noninflammatory disorders of vagina: Secondary | ICD-10-CM

## 2023-03-06 DIAGNOSIS — A6004 Herpesviral vulvovaginitis: Secondary | ICD-10-CM | POA: Diagnosis not present

## 2023-03-06 DIAGNOSIS — Z1339 Encounter for screening examination for other mental health and behavioral disorders: Secondary | ICD-10-CM | POA: Diagnosis not present

## 2023-03-06 DIAGNOSIS — M549 Dorsalgia, unspecified: Secondary | ICD-10-CM | POA: Diagnosis not present

## 2023-03-06 DIAGNOSIS — Z01419 Encounter for gynecological examination (general) (routine) without abnormal findings: Secondary | ICD-10-CM

## 2023-03-06 DIAGNOSIS — B9689 Other specified bacterial agents as the cause of diseases classified elsewhere: Secondary | ICD-10-CM

## 2023-03-06 DIAGNOSIS — N760 Acute vaginitis: Secondary | ICD-10-CM

## 2023-03-06 DIAGNOSIS — Z1231 Encounter for screening mammogram for malignant neoplasm of breast: Secondary | ICD-10-CM | POA: Diagnosis not present

## 2023-03-06 LAB — POCT URINALYSIS DIPSTICK
Bilirubin, UA: NEGATIVE
Glucose, UA: NEGATIVE
Ketones, UA: NEGATIVE
Leukocytes, UA: NEGATIVE
Nitrite, UA: NEGATIVE
Protein, UA: NEGATIVE
Spec Grav, UA: 1.02 (ref 1.010–1.025)
Urobilinogen, UA: 0.2 U/dL
pH, UA: 6.5 (ref 5.0–8.0)

## 2023-03-06 MED ORDER — VALACYCLOVIR HCL 500 MG PO TABS
500.0000 mg | ORAL_TABLET | Freq: Every day | ORAL | 4 refills | Status: AC
Start: 2023-03-06 — End: ?

## 2023-03-06 NOTE — Progress Notes (Signed)
 GYNECOLOGY ANNUAL PREVENTATIVE CARE ENCOUNTER NOTE  History:     Ashley [name] is a 58 y.o. [phone] PMP female  with history of total hysterectomy in 2006 for benign fibroid here for a routine annual gynecologic exam.  Current complaints: vaginal discharge without irritation last week, desires vaginitis evaluation. Wants refill of her Valtrex  for recurrent genital HSV episodes.  Also desires urinalysis.  Also desires annual labs (usual labs not checked by her PCP in 11/2022 ) and STI screen.  Denies abnormal vaginal bleeding, pelvic pain, problems with intercourse or other gynecologic concerns.    Gynecologic History No LMP recorded. Patient has had a hysterectomy. Last Mammogram: 03/18/2022.  Result was normal Last Colonoscopy: 08/06/2021.  Result was normal  Obstetric History OB History  Gravida Para Term Preterm AB Living  5 3 3  0 2 3  SAB IAB Ectopic Multiple Live Births  2 0 0 0 3    # Outcome Date GA Lbr Len/2nd Weight Sex Type Anes PTL Lv  5 Term      Vag-Spont   LIV  4 Term      CS-Unspec   LIV  3 Term      CS-Unspec   LIV  2 SAB      SAB     1 SAB      SAB       Past Medical History:  Diagnosis Date   Abdominal pain 06/23/2018   Acute breast pain 01/30/2022   Acute midline low back pain without sciatica 09/10/2019   Acute right flank pain 06/29/2021   Adverse effect of drug 08/15/2019   Anemia    Bronchitis    2 years ago    [facility] arthritis 2019   base of left thumb    Constipation 04/07/2019   Discoloration of skin 05/17/2021   Diverticulitis of colon    GERD (gastroesophageal reflux disease)    diet controlled , no meds   Hydrosalpinx    bilateral    Hyperlipidemia    recent dx - currently diet control - no meds   Hypertension    Hypothyroid 2003   radio active iodine - no meds   Left-sided weakness 02/12/2019   Ovarian cyst, left 02/23/2016   Urinary frequency 11/30/2018   Vitamin B 12 deficiency     Past Surgical History:  Procedure  Laterality Date   CESAREAN SECTION     x 2   CHOLECYSTECTOMY  2008   COLONOSCOPY WITH PROPOFOL  N/A 08/06/2021   Procedure: COLONOSCOPY WITH PROPOFOL ;  Surgeon: Jinny Carmine, MD;  Location: [facility];  Service: Endoscopy;  Laterality: N/A;   DILATION AND CURETTAGE OF UTERUS     X 2   ESOPHAGOGASTRODUODENOSCOPY (EGD) WITH PROPOFOL  N/A 08/06/2021   Procedure: ESOPHAGOGASTRODUODENOSCOPY (EGD) WITH PROPOFOL ;  Surgeon: Jinny Carmine, MD;  Location: [facility];  Service: Endoscopy;  Laterality: N/A;   LAPAROSCOPIC BILATERAL SALPINGECTOMY Bilateral 07/29/2017   Procedure: LAPAROSCOPIC LEFT SALPINGOOPHRECTOMY: Partial right Salpingectomy;  Surgeon: Corene Coy, MD;  Location: [facility] [place];  Service: Gynecology;  Laterality: Bilateral;   POLYPECTOMY  08/06/2021   Procedure: POLYPECTOMY;  Surgeon: Jinny Carmine, MD;  Location: [facility];  Service: Endoscopy;;   SHOULDER SURGERY  2016   arthroscopic   TOTAL ABDOMINAL HYSTERECTOMY  2006   Fibroids, benign pathology   TUBAL LIGATION     WISDOM TOOTH EXTRACTION     at age 84 yr   WRIST ARTHROSCOPY Right 2007    Current  Outpatient Medications on File Prior to Visit  Medication Sig Dispense Refill   amLODipine  (NORVASC ) 10 MG tablet Take 10 mg by mouth daily.     aspirin  81 MG EC tablet Take by mouth.     azelastine  (ASTELIN ) 0.1 % nasal spray Place into the nose.     fluticasone  (FLONASE ) 50 MCG/ACT nasal spray Place 1 spray into both nostrils 2 (two) times daily. 16 g 2   gabapentin (NEURONTIN) 600 MG tablet Take 1,200 mg by mouth daily.     atorvastatin  (LIPITOR) 10 MG tablet Take 1 tablet (10 mg total) by mouth daily. for cholesterol. 90 tablet 0   metroNIDAZOLE  (FLAGYL ) 500 MG tablet Take 1 tablet (500 mg total) by mouth 2 (two) times daily. (Patient not taking: Reported on 03/06/2023) 14 tablet 0   pantoprazole  (PROTONIX ) 40 MG tablet Take 1 tablet (40 mg total) by mouth 2 (two) times daily.  for heartburn. 180 tablet 0   No current facility-administered medications on file prior to visit.    Allergies  Allergen Reactions   Aczone [Dapsone] Swelling   Levaquin  [Levofloxacin ]     Presumed cause of joint aches    Social History:  reports that she has never smoked. She has never used smokeless tobacco. She reports that she does not drink alcohol and does not use drugs.  Family History  Problem Relation Age of Onset   Hypertension Mother    Ovarian cancer Mother    Other Father        unknown medical history   Hypertension Maternal Grandmother    Heart failure Maternal Grandmother    Lung cancer Maternal Grandmother    Breast cancer Maternal Aunt    Colon cancer Neg Hx    Colon polyps Neg Hx    Rectal cancer Neg Hx    Stomach cancer Neg Hx     The following portions of the patient's history were reviewed and updated as appropriate: allergies, current medications, past family history, past medical history, past social history, past surgical history and problem list.  Review of Systems Pertinent items noted in HPI and remainder of comprehensive ROS otherwise negative.  Physical Exam:  BP (!) 176/106   Pulse 85   Ht 5' 6 (1.676 m)   Wt 172 lb (78 kg)   BMI 27.76 kg/m  Vitals:   03/06/23 1033 03/06/23 1126  BP: (!) 173/103 (!) 176/106   CONSTITUTIONAL: Well-developed, well-nourished female in no acute distress.  HENT:  Normocephalic, atraumatic, External right and left ear normal.  EYES: Conjunctivae and EOM are normal. Pupils are equal, round, and reactive to light. No scleral icterus.  NECK: Normal range of motion, supple, no masses.  Normal thyroid .  SKIN: Skin is warm and dry. No rash noted. Not diaphoretic. No erythema. No pallor. MUSCULOSKELETAL: Normal range of motion. No tenderness.  No cyanosis, clubbing, or edema. NEUROLOGIC: Alert and oriented to person, place, and time. Normal reflexes, muscle tone coordination.  PSYCHIATRIC: Normal mood and  affect. Normal behavior. Normal judgment and thought content. CARDIOVASCULAR: Normal heart rate noted, regular rhythm RESPIRATORY: Clear to auscultation bilaterally. Effort and breath sounds normal, no problems with respiration noted. BREASTS: Symmetric in size. No masses, tenderness, skin changes, nipple drainage, or lymphadenopathy bilaterally. Performed in the presence of a chaperone. ABDOMEN: Soft, no distention noted.  No tenderness, rebound or guarding.  PELVIC:  Normal appearing external genitalia; normal appearing vaginal mucosa and well-healed cuff. White discharge noted, sample obtained. No tenderness on exam.  Performed  in the presence of a chaperone.  Results for orders placed or performed in visit on 03/06/23 (from the past 24 hours)  POCT Urinalysis Dipstick     Status: None   Collection Time: 03/06/23 10:42 AM  Result Value Ref Range   Color, UA     Clarity, UA     Glucose, UA Negative Negative   Bilirubin, UA Neg    Ketones, UA neg    Spec Grav, UA 1.020 1.010 - 1.025   Blood, UA trace    pH, UA 6.5 5.0 - 8.0   Protein, UA Negative Negative   Urobilinogen, UA 0.2 0.2 or 1.0 E.U./dL   Nitrite, UA Neg    Leukocytes, UA Negative Negative   Appearance     Odor      Assessment and Plan:    1. Vaginal discharge - Cervicovaginal ancillary only done, will follow up results and manage accordingly.  2. Recurrent herpetic vulvovaginitis Discussed suppression therapy and then increasing dosage as needed for outbreaks, she agreed. This was prescribed. - valACYclovir  (VALTREX ) 500 MG tablet; Take 1 tablet (500 mg total) by mouth daily. For suppression.  Take two tablets for 5 days in the event of an outbreak.  Dispense: 90 tablet; Refill: 4  3. Breast cancer screening by mammogram Mammogram scheduled for breast cancer screening. - MM 3D SCREENING MAMMOGRAM BILATERAL BREAST; Future  4. Well woman exam with routine gynecological exam (Primary) - Lipid panel - TSH Rfx on  Abnormal to Free T4 - Hemoglobin A1c - POCT Urinalysis Dipstick > negative Will follow up results of lab and manage accordingly. Colon cancer screening is up to date. Routine preventative health maintenance measures emphasized. Please refer to After Visit Summary for other counseling recommendations.      GLORIS HUGGER, MD, FACOG Obstetrician & Gynecologist, [facility] for [employer], [facility]

## 2023-03-07 LAB — CERVICOVAGINAL ANCILLARY ONLY
Bacterial Vaginitis (gardnerella): POSITIVE — AB
Candida Glabrata: NEGATIVE
Candida Vaginitis: NEGATIVE
Comment: NEGATIVE
Comment: NEGATIVE
Comment: NEGATIVE

## 2023-03-07 LAB — HEMOGLOBIN A1C
Est. average glucose Bld gHb Est-mCnc: 117 mg/dL
Hgb A1c MFr Bld: 5.7 % — ABNORMAL HIGH (ref 4.8–5.6)

## 2023-03-07 LAB — LIPID PANEL
Chol/HDL Ratio: 3.1 ratio (ref 0.0–4.4)
Cholesterol, Total: 283 mg/dL — ABNORMAL HIGH (ref 100–199)
HDL: 90 mg/dL (ref >39)
LDL Chol Calc (NIH): 183 mg/dL — ABNORMAL HIGH (ref 0–99)
Triglycerides: 68 mg/dL (ref 0–149)
VLDL Cholesterol Cal: 10 mg/dL (ref 5–40)

## 2023-03-07 LAB — TSH RFX ON ABNORMAL TO FREE T4: TSH: 1.8 u[IU]/mL (ref 0.450–4.500)

## 2023-03-07 MED ORDER — METRONIDAZOLE 500 MG PO TABS
500.0000 mg | ORAL_TABLET | Freq: Two times a day (BID) | ORAL | 0 refills | Status: AC
Start: 2023-03-07 — End: 2023-03-14

## 2023-03-07 NOTE — Addendum Note (Signed)
 Addended by: Jaynie Collins A on: 03/07/2023 04:07 PM   Modules accepted: Orders

## 2023-03-25 ENCOUNTER — Ambulatory Visit
Admission: RE | Admit: 2023-03-25 | Discharge: 2023-03-25 | Disposition: A | Discharge status: Home / Self Care | Payer: Managed Care, Other (non HMO) | Source: Ambulatory Visit | Attending: Obstetrics & Gynecology | Admitting: Obstetrics & Gynecology

## 2023-03-25 DIAGNOSIS — Z1231 Encounter for screening mammogram for malignant neoplasm of breast: Secondary | ICD-10-CM

## 2023-03-27 ENCOUNTER — Encounter: Payer: Self-pay | Admitting: Obstetrics & Gynecology

## 2023-04-30 IMAGING — MG MM DIGITAL SCREENING BILAT W/ TOMO AND CAD
8 series · 8 of 24 positions shown · non-contrast
Comparison: Previous exam(s).

CLINICAL DATA: Screening.

EXAM:
DIGITAL SCREENING BILATERAL MAMMOGRAM WITH TOMOSYNTHESIS AND CAD
TECHNIQUE: Bilateral screening digital craniocaudal and mediolateral oblique
mammograms were obtained. Bilateral screening digital breast
tomosynthesis was performed. The images were evaluated with
computer-aided detection.

[L MLO synth-2D]
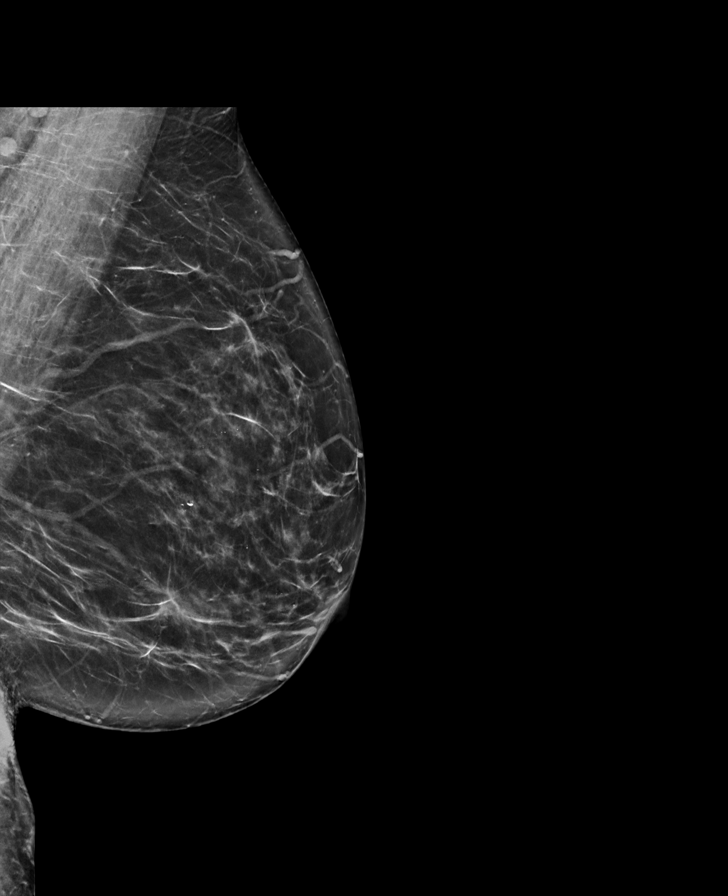

[R MLO synth-2D]
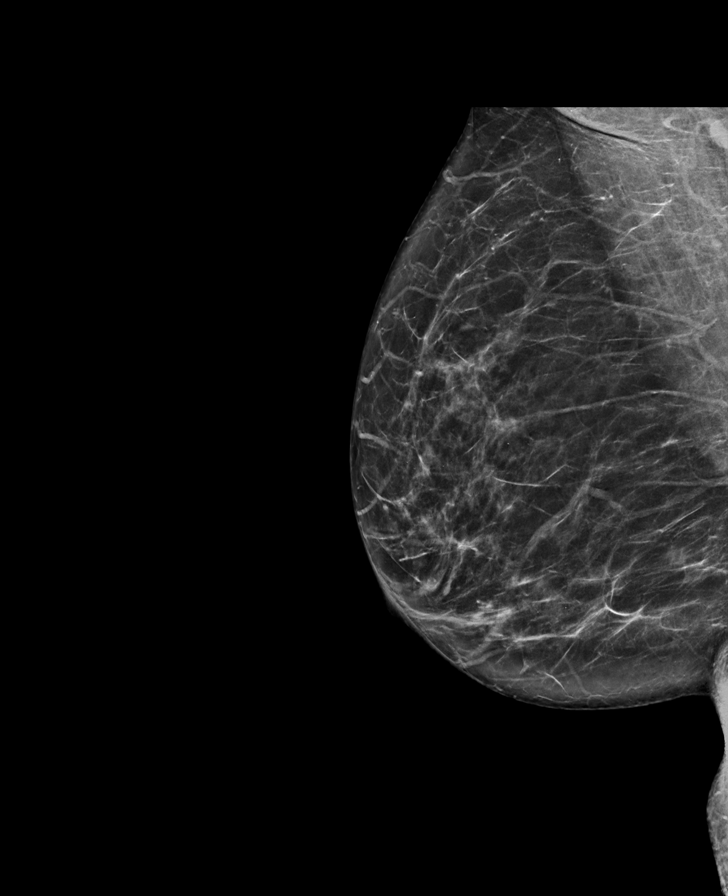

[L CC synth-2D]
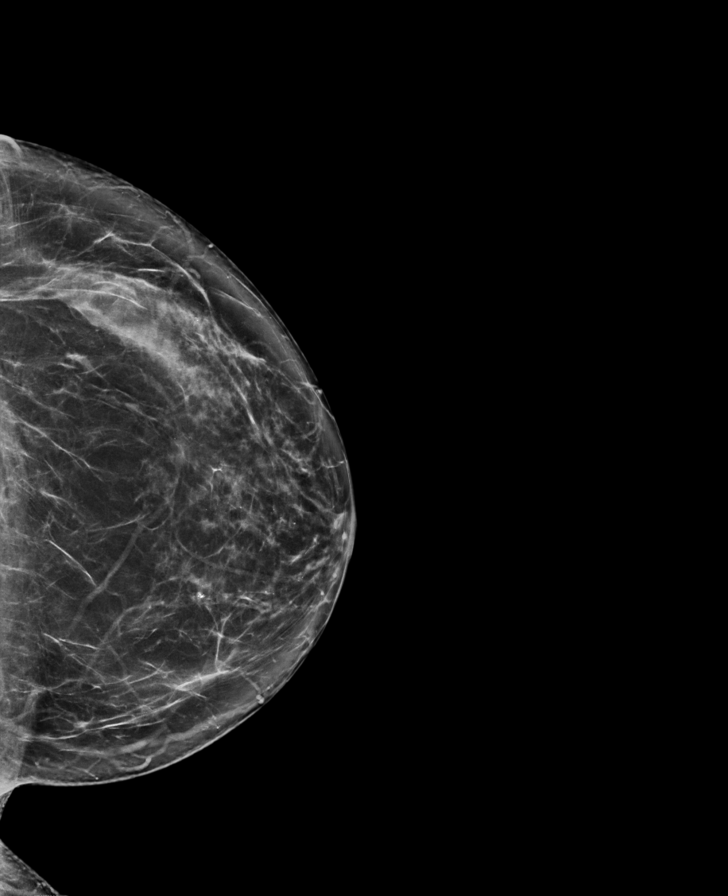

[R CC synth-2D]
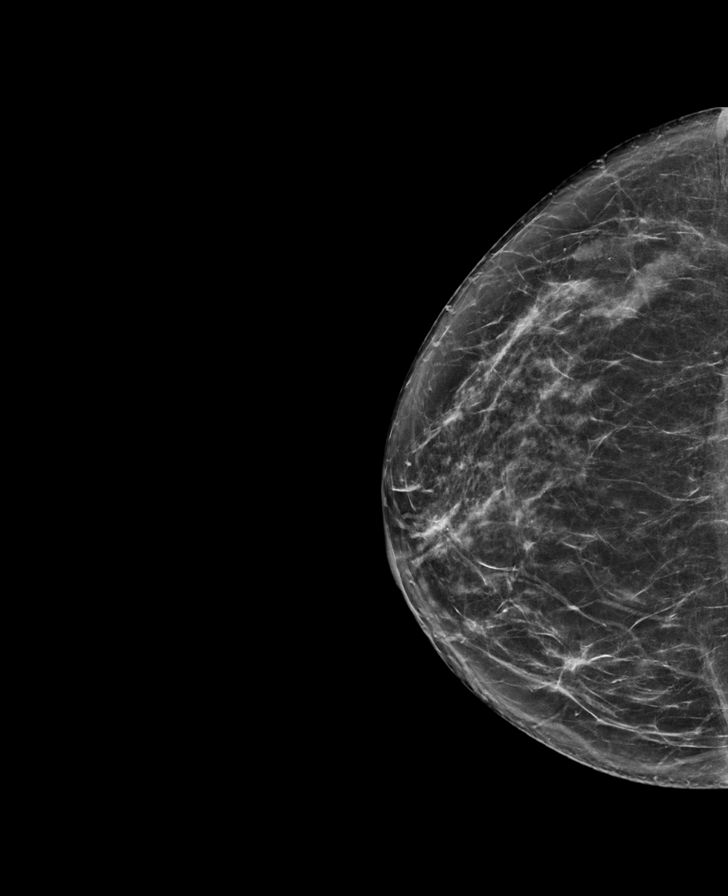

[R CC tomo · tomo slice 38/75.0]
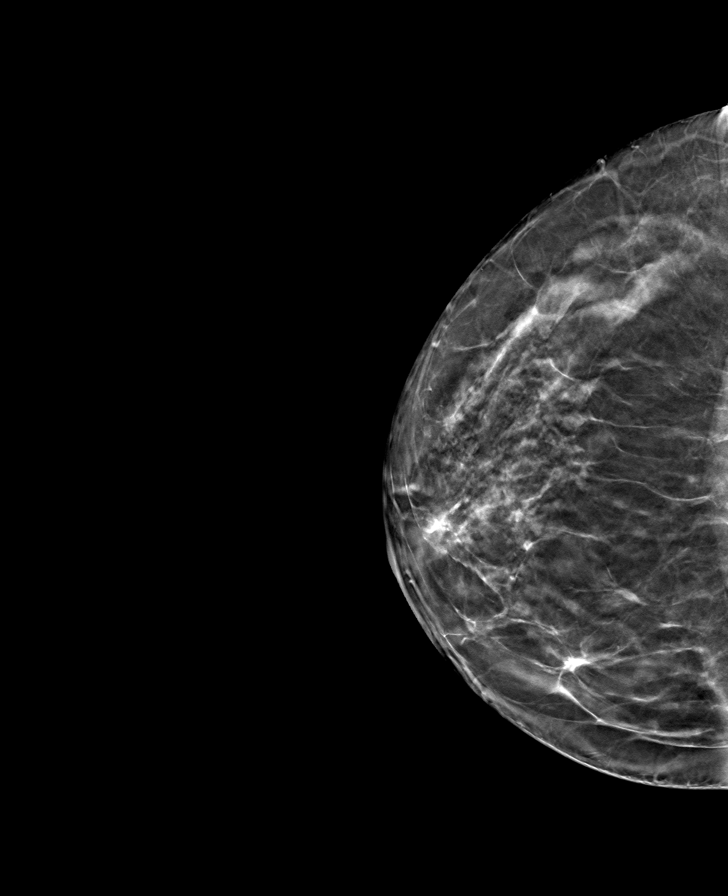

[L MLO tomo · tomo slice 40/79.0]
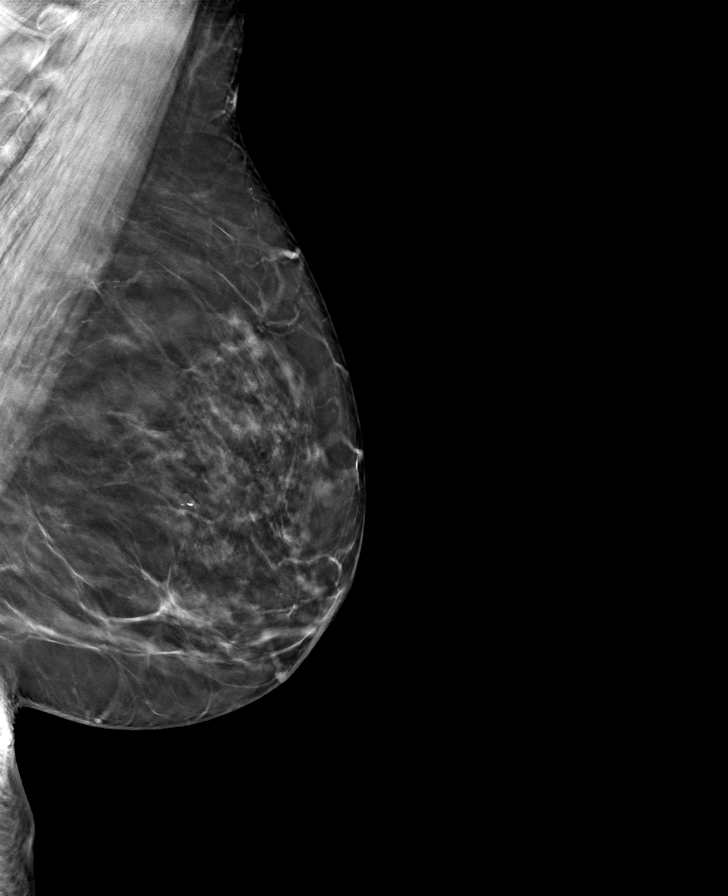

[R MLO tomo · tomo slice 39/77.0]
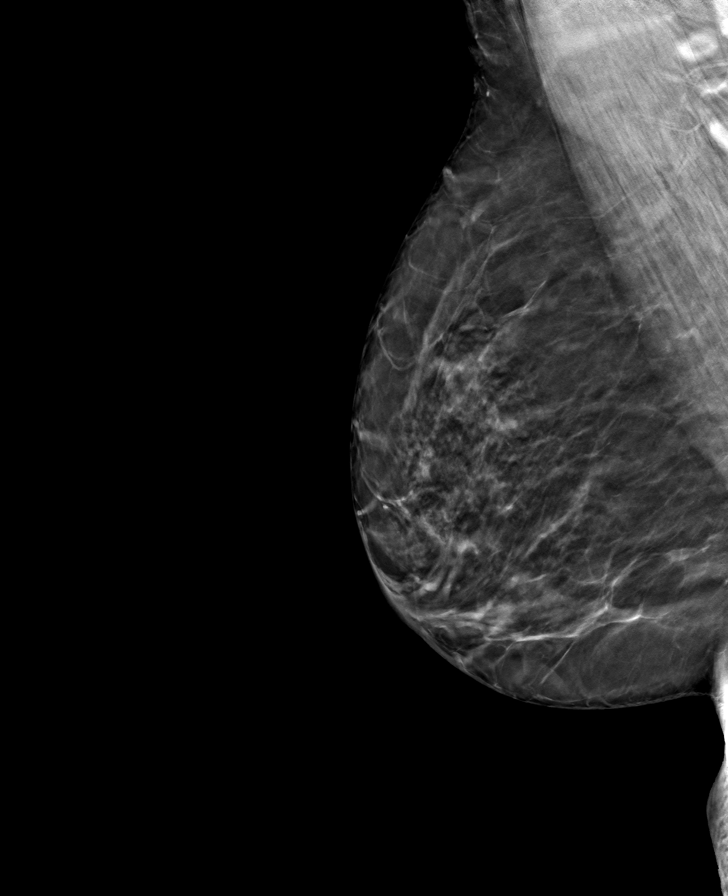

[L CC tomo · tomo slice 39/78.0]
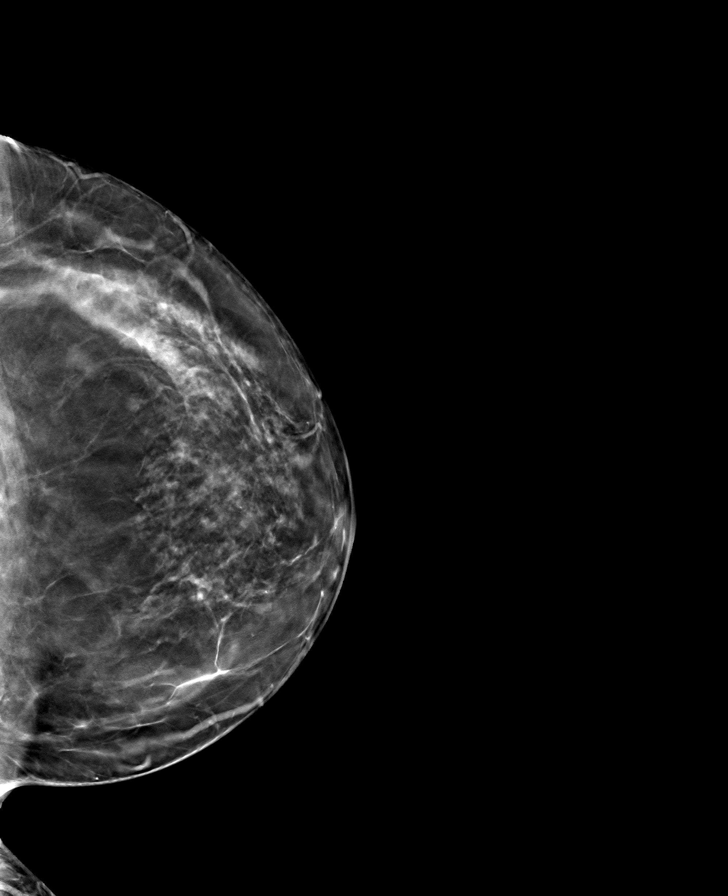

[8 of 24 positions shown; findings below may reference images not displayed]

ACR Breast Density Category b: There are scattered areas of
fibroglandular density.
FINDINGS: There are no findings suspicious for malignancy.
IMPRESSION: No mammographic evidence of malignancy. A result letter of this
screening mammogram will be mailed directly to the patient.

RECOMMENDATION:
Screening mammogram in one year. (Code:51-O-LD2)

BI-RADS CATEGORY  1: Negative.

## 2023-05-19 ENCOUNTER — Telehealth: Payer: Self-pay

## 2023-05-19 NOTE — Transitions of Care (Post Inpatient/ED Visit) (Signed)
 Unable to reach patient by phone and left v/m requesting call back at [phone].           05/19/2023  Name: Ashley [name] MRN: [id number] DOB: [birth year]  Today's TOC FU Call Status: Today's TOC FU Call Status:: Unsuccessful Call (1st Attempt)  Attempted to reach the patient regarding the most recent Inpatient/ED visit.  Follow Up Plan: Additional outreach attempts will be made to reach the patient to complete the Transitions of Care (Post Inpatient/ED visit) call.   Signature Lewanda Rife, LPN

## 2023-05-20 NOTE — Transitions of Care (Post Inpatient/ED Visit) (Signed)
 Unable to reach patient by phone and left v/m requesting call back at [phone].       05/20/2023  Name: Ashley [name] MRN: [id number] DOB: 1965/04/18  Today's TOC FU Call Status: Today's TOC FU Call Status:: Unsuccessful Call (2nd Attempt) Unsuccessful Call (2nd Attempt) Date: 05/20/23  Attempted to reach the patient regarding the most recent Inpatient/ED visit.  Follow Up Plan: Additional outreach attempts will be made to reach the patient to complete the Transitions of Care (Post Inpatient/ED visit) call.   Signature Lewanda Rife, LPN

## 2023-05-21 NOTE — Transitions of Care (Post Inpatient/ED Visit) (Signed)
 Unable to reach patient by phone and left v/m requesting call back at [phone]. This is the third call to pt; Pt already has made appt thru my chart with Allayne Gitelman NP on 05/30/23 at 11:40 for non urgent rt side pain. Sending note to Allayne Gitelman NP.       05/21/2023  Name: Ashley [name] MRN: [id number] DOB: [birth year]  Today's TOC FU Call Status: Today's TOC FU Call Status:: Unsuccessful Call (3rd Attempt) Unsuccessful Call (2nd Attempt) Date: 05/20/23 Unsuccessful Call (3rd Attempt) Date: 05/21/23  Attempted to reach the patient regarding the most recent Inpatient/ED visit.  Follow Up Plan: No further outreach attempts will be made at this time. We have been unable to contact the patient.  Signature Lewanda Rife, LPN

## 2023-05-21 NOTE — Telephone Encounter (Signed)
 Noted.

## 2023-05-30 ENCOUNTER — Ambulatory Visit: Admitting: Primary Care

## 2023-05-30 ENCOUNTER — Ambulatory Visit
Admission: RE | Admit: 2023-05-30 | Discharge: 2023-05-30 | Disposition: A | Discharge status: Home / Self Care | Source: Ambulatory Visit | Attending: Primary Care | Admitting: Primary Care

## 2023-05-30 VITALS — BP 138/86 | HR 79 | Temp 97.7°F | Ht 66.0 in | Wt 174.0 lb

## 2023-05-30 DIAGNOSIS — M25551 Pain in right hip: Secondary | ICD-10-CM

## 2023-05-30 DIAGNOSIS — G8929 Other chronic pain: Secondary | ICD-10-CM

## 2023-05-30 DIAGNOSIS — R350 Frequency of micturition: Secondary | ICD-10-CM | POA: Diagnosis not present

## 2023-05-30 LAB — POC URINALSYSI DIPSTICK (AUTOMATED)
Bilirubin, UA: NEGATIVE
Blood, UA: NEGATIVE
Glucose, UA: NEGATIVE
Ketones, UA: NEGATIVE
Leukocytes, UA: NEGATIVE
Nitrite, UA: NEGATIVE
Protein, UA: NEGATIVE
Spec Grav, UA: 1.01 (ref 1.010–1.025)
Urobilinogen, UA: 0.2 U/dL
pH, UA: 7 (ref 5.0–8.0)

## 2023-05-30 NOTE — Progress Notes (Signed)
 Subjective:    Patient ID: Ashley [name], female    DOB: 05-27-1965, 58 y.o.   MRN: [id number]  HPI  Ashley [name] is a very pleasant 58 y.o. female with a history of hypertension, neuropathy, chronic neck pain, chronic low back pain, chronic bilateral shoulder pain, osteoarthritis, chronic hand pain, diverticulitis who presents today to discuss several concerns.  1) Chronic Right Hip Pain: Chronic for 2 years, worse over the last 6 months. Following with physical medicine who referred her to physical therapy, doesn't feel like PT is helping. He underwent hip injection on 03/28/23, initially experienced 100% improvement but pain increased after working 7 consecutive days.   She cannot have another injection until late April 2025. She has never undergone imaging of her right hip.   2) Abdominal Pain/Urinary Frequency: Evaluated by GI yesterday for evaluation of diverticulitis. Diagnosed with mild diverticulitis and was prescribed Augmentin 875-125 BID x 14 days. She has yet to start this prescription.   Symptom onset 2-3 days ago with increased urinary frequency. She's also noticed right sided pelvic pressure. She denies hematuria, dysuria, fevers, flank pain, vaginal discharge/itching. She does drink a lot of liquids before bedtime, denies excessive caffeine consumption.   BP Readings from Last 3 Encounters:  05/30/23 138/86  03/06/23 (!) 176/106  12/12/22 124/80      Review of Systems  Constitutional:  Negative for chills and fever.  Gastrointestinal:  Positive for abdominal pain.  Genitourinary:  Positive for frequency. Negative for dysuria, flank pain and vaginal discharge.  Musculoskeletal:  Positive for arthralgias, back pain and myalgias.         Past Medical History:  Diagnosis Date   Abdominal pain 06/23/2018   Acute breast pain 01/30/2022   Acute midline low back pain without sciatica 09/10/2019   Acute right flank pain 06/29/2021   Adverse effect  of drug 08/15/2019   Anemia    Bronchitis    2 years ago    [facility] arthritis 2019   base of left thumb    Constipation 04/07/2019   Discoloration of skin 05/17/2021   Diverticulitis of colon    GERD (gastroesophageal reflux disease)    diet controlled , no meds   Hydrosalpinx    bilateral    Hyperlipidemia    recent dx - currently diet control - no meds   Hypertension    Hypothyroid 2003   radio active iodine - no meds   Left-sided weakness 02/12/2019   Ovarian cyst, left 02/23/2016   Urinary frequency 11/30/2018   Vitamin B 12 deficiency     Social History   Socioeconomic History   Marital status: Single    Spouse name: Not on file   Number of children: Not on file   Years of education: Not on file   Highest education level: Not on file  Occupational History   Not on file  Tobacco Use   Smoking status: Never   Smokeless tobacco: Never  Vaping Use   Vaping status: Never Used  Substance and Sexual Activity   Alcohol use: No   Drug use: No   Sexual activity: Yes    Partners: Male    Birth control/protection: Surgical    Comment: hysterectomy.  Other Topics Concern   Not on file  Social History Narrative   Single.   3 children.   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   Enjoys relaxing, exercise, walking her dog.    Social Drivers of Dispensing optician  Resource Strain: Not on file  Food Insecurity: Not on file  Transportation Needs: Not on file  Physical Activity: Not on file  Stress: Not on file  Social Connections: Unknown (11/21/2022)   Received from [facility]   Social Network    Social Network: Not on file  Intimate Partner Violence: Unknown (11/21/2022)   Received from [facility]   HITS    Physically Hurt: Not on file    Insult or Talk Down To: Not on file    Threaten Physical Harm: Not on file    Scream or Curse: Not on file    Past Surgical History:  Procedure Laterality Date   CESAREAN SECTION     x 2   CHOLECYSTECTOMY  2008   COLONOSCOPY WITH  PROPOFOL N/A 08/06/2021   Procedure: COLONOSCOPY WITH PROPOFOL;  Surgeon: Midge Minium, MD;  Location: [facility];  Service: Endoscopy;  Laterality: N/A;   DILATION AND CURETTAGE OF UTERUS     X 2   ESOPHAGOGASTRODUODENOSCOPY (EGD) WITH PROPOFOL N/A 08/06/2021   Procedure: ESOPHAGOGASTRODUODENOSCOPY (EGD) WITH PROPOFOL;  Surgeon: Midge Minium, MD;  Location: [facility];  Service: Endoscopy;  Laterality: N/A;   LAPAROSCOPIC BILATERAL SALPINGECTOMY Bilateral 07/29/2017   Procedure: LAPAROSCOPIC LEFT SALPINGOOPHRECTOMY: Partial right Salpingectomy;  Surgeon: Willodean Rosenthal, MD;  Location: [facility];  Service: Gynecology;  Laterality: Bilateral;   POLYPECTOMY  08/06/2021   Procedure: POLYPECTOMY;  Surgeon: Midge Minium, MD;  Location: [facility];  Service: Endoscopy;;   SHOULDER SURGERY  2016   arthroscopic   TOTAL ABDOMINAL HYSTERECTOMY  2006   Fibroids, benign pathology   TUBAL LIGATION     WISDOM TOOTH EXTRACTION     at age 25 yr   WRIST ARTHROSCOPY Right 2007    Family History  Problem Relation Age of Onset   Hypertension Mother    Ovarian cancer Mother    Other Father        unknown medical history   Hypertension Maternal Grandmother    Heart failure Maternal Grandmother    Lung cancer Maternal Grandmother    Breast cancer Maternal Aunt    Colon cancer Neg Hx    Colon polyps Neg Hx    Rectal cancer Neg Hx    Stomach cancer Neg Hx     Allergies  Allergen Reactions   Aczone [Dapsone] Swelling   Levaquin [Levofloxacin]     Presumed cause of joint aches    Current Outpatient Medications on File Prior to Visit  Medication Sig Dispense Refill   amLODipine (NORVASC) 10 MG tablet Take 10 mg by mouth daily.     amoxicillin-clavulanate (AUGMENTIN) 875-125 MG tablet Take 1 tablet by mouth 2 (two) times daily.     aspirin 81 MG EC tablet Take by mouth.     atorvastatin (LIPITOR) 10 MG tablet Take 1 tablet (10 mg total) by mouth  daily. for cholesterol. 90 tablet 0   azelastine (ASTELIN) 0.1 % nasal spray Place into the nose.     fluticasone (FLONASE) 50 MCG/ACT nasal spray Place 1 spray into both nostrils 2 (two) times daily. 16 g 2   gabapentin (NEURONTIN) 600 MG tablet Take 1,200 mg by mouth daily.     pantoprazole (PROTONIX) 40 MG tablet Take 1 tablet (40 mg total) by mouth 2 (two) times daily. for heartburn. 180 tablet 0   valACYclovir (VALTREX) 500 MG tablet Take 1 tablet (500 mg total) by mouth daily. For suppression.  Take two tablets for 5  days in the event of an outbreak. 90 tablet 4   No current facility-administered medications on file prior to visit.    BP 138/86 (BP Location: Left Arm, Patient Position: Sitting, Cuff Size: Normal)   Pulse 79   Temp 97.7 F (36.5 C) (Oral)   Ht 5\' 6"  (1.676 m)   Wt 174 lb (78.9 kg)   SpO2 97%   BMI 28.08 kg/m  Objective:   Physical Exam Cardiovascular:     Rate and Rhythm: Normal rate and regular rhythm.  Pulmonary:     Effort: Pulmonary effort is normal.     Breath sounds: Normal breath sounds.  Abdominal:     Tenderness: There is no right CVA tenderness or left CVA tenderness.  Musculoskeletal:     Cervical back: Neck supple.  Skin:    General: Skin is warm and dry.  Neurological:     Mental Status: She is alert and oriented to person, place, and time.  Psychiatric:        Mood and Affect: Mood normal.           Assessment & Plan:  Chronic hip pain, right Assessment & Plan: Reviewed physical medicine office notes from February 2025.  Checking plain films of the right hip today. Consider MRI if needed.  Complete injection in late April if able.   Orders: -     DG HIP UNILAT W OR W/O PELVIS 2-3 VIEWS RIGHT  Urinary frequency Assessment & Plan: UA today negative.  Consider side effect from antihypertensive medication vs inflammation from current diverticulitis flare. Consider overactive bladder diagnosis.   She will update if symptoms  persist.   Orders: -     POCT Urinalysis Dipstick (Automated)        Doreene Nest, NP

## 2023-05-30 NOTE — Assessment & Plan Note (Signed)
 UA today negative.  Consider side effect from antihypertensive medication vs inflammation from current diverticulitis flare. Consider overactive bladder diagnosis.   She will update if symptoms persist.

## 2023-05-30 NOTE — Assessment & Plan Note (Signed)
 Reviewed physical medicine office notes from February 2025.  Checking plain films of the right hip today. Consider MRI if needed.  Complete injection in late April if able.

## 2023-05-30 NOTE — Patient Instructions (Signed)
Complete xray(s) prior to leaving today. I will notify you of your results once received.  It was a pleasure to see you today!  

## 2023-06-03 ENCOUNTER — Ambulatory Visit: Admitting: Primary Care

## 2023-06-20 ENCOUNTER — Other Ambulatory Visit: Payer: Self-pay | Admitting: Primary Care

## 2023-06-20 DIAGNOSIS — K219 Gastro-esophageal reflux disease without esophagitis: Secondary | ICD-10-CM

## 2023-06-20 MED ORDER — PANTOPRAZOLE SODIUM 40 MG PO TBEC: 40.0000 mg | DELAYED_RELEASE_TABLET | Freq: Two times a day (BID) | ORAL | 0 refills | Status: DC

## 2023-06-23 ENCOUNTER — Other Ambulatory Visit: Payer: Self-pay | Admitting: Physical Medicine & Rehabilitation

## 2023-06-23 DIAGNOSIS — M5416 Radiculopathy, lumbar region: Secondary | ICD-10-CM

## 2023-06-29 ENCOUNTER — Ambulatory Visit
Admission: RE | Admit: 2023-06-29 | Discharge: 2023-06-29 | Disposition: A | Discharge status: Home / Self Care | Source: Ambulatory Visit | Attending: Physical Medicine & Rehabilitation | Admitting: Physical Medicine & Rehabilitation

## 2023-06-29 DIAGNOSIS — M5416 Radiculopathy, lumbar region: Secondary | ICD-10-CM

## 2023-07-22 DIAGNOSIS — I10 Essential (primary) hypertension: Secondary | ICD-10-CM

## 2023-07-23 MED ORDER — AMLODIPINE BESYLATE 10 MG PO TABS: 10.0000 mg | ORAL_TABLET | Freq: Every day | ORAL | 0 refills | Status: DC

## 2023-08-01 ENCOUNTER — Encounter: Payer: Self-pay | Admitting: Primary Care

## 2023-08-01 ENCOUNTER — Ambulatory Visit: Payer: Self-pay | Admitting: Primary Care

## 2023-08-01 ENCOUNTER — Other Ambulatory Visit

## 2023-08-01 ENCOUNTER — Ambulatory Visit: Admitting: Primary Care

## 2023-08-01 ENCOUNTER — Other Ambulatory Visit: Payer: Self-pay | Admitting: Primary Care

## 2023-08-01 VITALS — BP 146/86 | HR 93 | Temp 97.3°F | Ht 66.0 in | Wt 174.0 lb

## 2023-08-01 DIAGNOSIS — G8929 Other chronic pain: Secondary | ICD-10-CM | POA: Diagnosis not present

## 2023-08-01 DIAGNOSIS — M5441 Lumbago with sciatica, right side: Secondary | ICD-10-CM | POA: Diagnosis not present

## 2023-08-01 DIAGNOSIS — L299 Pruritus, unspecified: Secondary | ICD-10-CM | POA: Insufficient documentation

## 2023-08-01 DIAGNOSIS — R7303 Prediabetes: Secondary | ICD-10-CM

## 2023-08-01 DIAGNOSIS — R519 Headache, unspecified: Secondary | ICD-10-CM

## 2023-08-01 DIAGNOSIS — Z8719 Personal history of other diseases of the digestive system: Secondary | ICD-10-CM

## 2023-08-01 DIAGNOSIS — R768 Other specified abnormal immunological findings in serum: Secondary | ICD-10-CM

## 2023-08-01 LAB — HEMOGLOBIN A1C: Hgb A1c MFr Bld: 5.7 % (ref 4.6–6.5)

## 2023-08-01 LAB — BASIC METABOLIC PANEL WITH GFR
BUN: 17 mg/dL (ref 6–23)
CO2: 30 meq/L (ref 19–32)
Calcium: 10.4 mg/dL (ref 8.4–10.5)
Chloride: 101 meq/L (ref 96–112)
Creatinine, Ser: 0.84 mg/dL (ref 0.40–1.20)
GFR: 76.59 mL/min (ref >60.00)
Glucose, Bld: 115 mg/dL — ABNORMAL HIGH (ref 70–99)
Potassium: 4.7 meq/L (ref 3.5–5.1)
Sodium: 138 meq/L (ref 135–145)

## 2023-08-01 LAB — SEDIMENTATION RATE: Sed Rate: 16 mm/h (ref 0–30)

## 2023-08-01 LAB — CBC
HCT: 41.1 % (ref 36.0–46.0)
Hemoglobin: 13.3 g/dL (ref 12.0–15.0)
MCHC: 32.4 g/dL (ref 30.0–36.0)
MCV: 84.8 fl (ref 78.0–100.0)
Platelets: 261 10*3/uL (ref 150.0–400.0)
RBC: 4.84 Mil/uL (ref 3.87–5.11)
RDW: 15.4 % (ref 11.5–15.5)
WBC: 9.1 10*3/uL (ref 4.0–10.5)

## 2023-08-01 NOTE — Assessment & Plan Note (Signed)
 Stable.  Following with GI, office notes reviewed from June 225 through [facility] Everywhere.

## 2023-08-01 NOTE — Assessment & Plan Note (Signed)
 Following with physical medicine. Office notes reviewed from June 2025 through [facility] Everywhere.  Continue injections.  Continue gabapentin 600 mg BID.

## 2023-08-01 NOTE — Assessment & Plan Note (Signed)
 Symptoms likely secondary to irritated left lower molar that needs a filling.  Evident on exam today.  No obvious signs for temporal arteritis, will check sed rate and CBC today. She will follow-up with her dentist next week for a filling as discussed and update.  CBC pending.

## 2023-08-01 NOTE — Assessment & Plan Note (Addendum)
 Exam today overall benign.  We discussed options such as head and shoulders/Selsun Blue shampoo use OTC vs more routine application of moisturizing oil.  Other causes could be her wigs.   ANA pending.

## 2023-08-01 NOTE — Progress Notes (Signed)
 Subjective:    Patient ID: Ashley [name], female    DOB: 1966/02/12, 58 y.o.   MRN: [id number]  HPI  Ashley [name] is a very pleasant 58 y.o. female with a history of hypertension, GERD, neuropathy, chronic joint pain to multiple sites, prediabetes, hyperlipidemia, anxiety depression, frequent headaches who presents today discuss multiple symptoms.  1) Chronic Joint Aches: Currently following with physical medicine for lumbar radiculitis, chronic hip pain.  Last office visit was 3 days ago.  She completed an MRI of her spine in May 2025.  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  She underwent a steroid injection yesterday.  Her gabapentin was continued.   3) Nerve Impulses: Following with neurology. Over the last month she's noticed "impulses" to the parietal scalp lasting for a few seconds. These are not headaches.   Four days ago she developed left sided facial pain with tenderness behind her left ear. The numbness lasted for 5 minutes, no numbness since. She does have a left lower molar tooth that requires a filling. The dental pain has been bothering her for 2 weeks. She has an appointment next week with her dentist.   Her headaches have significantly improved since her cervical spine ablation.   3) Scalp Itching: Chronic for at least 12 months ago, worse over the last 6 months.   She denies rashes, dandruff, changes to her shampoo regimen prior to symptom onset. She does notice temporary improvement with rosemary oil.   She's also noticed facial acne more recently. Breaking out in white pimples. She questions if there is an autoimmune case.    Review of Systems  Constitutional:  Negative for fever.  HENT:  Positive for dental problem.   Musculoskeletal:  Positive for arthralgias and back pain.  Skin:        Itchy scalp, acne vulgaris  Neurological:  Negative for headaches.         Past Medical History:  Diagnosis  Date   Abdominal pain 06/23/2018   Acute breast pain 01/30/2022   Acute midline low back pain without sciatica 09/10/2019   Acute right flank pain 06/29/2021   Adverse effect of drug 08/15/2019   Anemia    Bronchitis    2 years ago    [facility] arthritis 2019   base of left thumb    Constipation 04/07/2019   Discoloration of skin 05/17/2021   Diverticulitis of colon    GERD (gastroesophageal reflux disease)    diet controlled , no meds   Hydrosalpinx    bilateral    Hyperlipidemia    recent dx - currently diet control - no meds   Hypertension    Hypothyroid 2003   radio active iodine - no meds   Left-sided weakness 02/12/2019   Ovarian cyst, left 02/23/2016   Urinary frequency 11/30/2018   Vitamin B 12 deficiency     Social History   Socioeconomic History   Marital status: Single    Spouse name: Not on file   Number of children: Not on file   Years of education: Not on file   Highest education level: Not on file  Occupational History   Not on file  Tobacco Use   Smoking status: Never   Smokeless tobacco: Never  Vaping Use   Vaping status: Never Used  Substance and Sexual Activity   Alcohol use: No   Drug use: No   Sexual activity: Yes    Partners: Male  Birth control/protection: Surgical    Comment: hysterectomy.  Other Topics Concern   Not on file  Social History Narrative   Single.   3 children.   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   Enjoys relaxing, exercise, walking her dog.    Social Drivers of Corporate investment banker Strain: Not on file  Food Insecurity: Not on file  Transportation Needs: Not on file  Physical Activity: Not on file  Stress: Not on file  Social Connections: Unknown (11/21/2022)   Received [facility]   Social Network    Social Network: Not on file  Intimate Partner Violence: Unknown (11/21/2022)   Received from [facility]   HITS    Physically Hurt: Not on file    Insult or Talk Down To: Not on file    Threaten Physical  Harm: Not on file    Scream or Curse: Not on file    Past Surgical History:  Procedure Laterality Date   CESAREAN SECTION     x 2   CHOLECYSTECTOMY  2008   COLONOSCOPY WITH PROPOFOL  N/A 08/06/2021   Procedure: COLONOSCOPY WITH PROPOFOL ;  Surgeon: Marnee Sink, MD;  Location: [facility];  Service: Endoscopy;  Laterality: N/A;   DILATION AND CURETTAGE OF UTERUS     X 2   ESOPHAGOGASTRODUODENOSCOPY (EGD) WITH PROPOFOL  N/A 08/06/2021   Procedure: ESOPHAGOGASTRODUODENOSCOPY (EGD) WITH PROPOFOL ;  Surgeon: Marnee Sink, MD;  Location: [facility];  Service: Endoscopy;  Laterality: N/A;   LAPAROSCOPIC BILATERAL SALPINGECTOMY Bilateral 07/29/2017   Procedure: LAPAROSCOPIC LEFT SALPINGOOPHRECTOMY: Partial right Salpingectomy;  Surgeon: Lenord Radon, MD;  Location: [facility] [place];  Service: Gynecology;  Laterality: Bilateral;   POLYPECTOMY  08/06/2021   Procedure: POLYPECTOMY;  Surgeon: Marnee Sink, MD;  Location: [facility];  Service: Endoscopy;;   SHOULDER SURGERY  2016   arthroscopic   TOTAL ABDOMINAL HYSTERECTOMY  2006   Fibroids, benign pathology   TUBAL LIGATION     WISDOM TOOTH EXTRACTION     at age 45 yr   WRIST ARTHROSCOPY Right 2007    Family History  Problem Relation Age of Onset   Hypertension Mother    Ovarian cancer Mother    Other Father        unknown medical history   Hypertension Maternal Grandmother    Heart failure Maternal Grandmother    Lung cancer Maternal Grandmother    Breast cancer Maternal Aunt    Colon cancer Neg Hx    Colon polyps Neg Hx    Rectal cancer Neg Hx    Stomach cancer Neg Hx     Allergies  Allergen Reactions   Aczone [Dapsone] Swelling   Levaquin  [Levofloxacin ]     Presumed cause of joint aches    Current Outpatient Medications on File Prior to Visit  Medication Sig Dispense Refill   amLODipine  (NORVASC ) 10 MG tablet Take 1 tablet (10 mg total) by mouth daily. for blood pressure. 90  tablet 0   aspirin  81 MG EC tablet Take by mouth.     atorvastatin  (LIPITOR) 10 MG tablet Take 1 tablet (10 mg total) by mouth daily. for cholesterol. 90 tablet 0   azelastine  (ASTELIN ) 0.1 % nasal spray Place into the nose.     fluticasone  (FLONASE ) 50 MCG/ACT nasal spray Place 1 spray into both nostrils 2 (two) times daily. 16 g 2   gabapentin (NEURONTIN) 600 MG tablet Take 1,200 mg by mouth daily.     pantoprazole  (PROTONIX )  40 MG tablet Take 1 tablet (40 mg total) by mouth 2 (two) times daily. for heartburn. 180 tablet 0   valACYclovir  (VALTREX ) 500 MG tablet Take 1 tablet (500 mg total) by mouth daily. For suppression.  Take two tablets for 5 days in the event of an outbreak. 90 tablet 4   No current facility-administered medications on file prior to visit.    BP (!) 146/86   Pulse 93   Temp (!) 97.3 F (36.3 C) (Temporal)   Ht 5\' 6"  (1.676 m)   Wt 174 lb (78.9 kg)   SpO2 99%   BMI 28.08 kg/m  Objective:   Physical Exam HENT:     Mouth/Throat:     Mouth: Mucous membranes are moist.     Dentition: Dental caries present.     Pharynx: Oropharynx is clear.      Comments: Dental cavity to left lower molar tooth.  No parotid gland swelling or tenderness.  Cardiovascular:     Rate and Rhythm: Normal rate and regular rhythm.  Pulmonary:     Effort: Pulmonary effort is normal.     Breath sounds: Normal breath sounds.  Musculoskeletal:     Cervical back: Neck supple.  Lymphadenopathy:     Cervical: No cervical adenopathy.  Skin:    General: Skin is warm and dry.     Comments: No rashes or dry skin to the scalp noticed.  Neurological:     Mental Status: She is alert and oriented to person, place, and time.  Psychiatric:        Mood and Affect: Mood normal.           Assessment & Plan:  Chronic bilateral low back pain with right-sided sciatica Assessment & Plan: Following with physical medicine. Office notes reviewed from June 2025 through Care  Everywhere.  Continue injections.  Continue gabapentin 600 mg BID.   History of diverticulitis Assessment & Plan: Stable.  Following with GI, office notes reviewed from June 225 through [facility] Everywhere.   Scalp itch Assessment & Plan: Exam today overall benign.  We discussed options such as head and shoulders/Selsun Blue shampoo use OTC vs more routine application of moisturizing oil.  Other causes could be her wigs.   ANA pending.   Orders: -     Basic metabolic panel with GFR -     ANA -     CBC  Facial pain Assessment & Plan: Symptoms likely secondary to irritated left lower molar that needs a filling.  Evident on exam today.  No obvious signs for temporal arteritis, will check sed rate and CBC today. She will follow-up with her dentist next week for a filling as discussed and update.  CBC pending.  Orders: -     Basic metabolic panel with GFR -     Sedimentation rate -     CBC        Gabriel John, NP

## 2023-08-01 NOTE — Patient Instructions (Signed)
 Stop by the lab prior to leaving today. I will notify you of your results once received.   It was a pleasure to see you today!

## 2023-08-03 LAB — ANTI-NUCLEAR AB-TITER (ANA TITER)
ANA TITER: 1:80 {titer} — ABNORMAL HIGH
ANA Titer 1: 1:40 {titer} — ABNORMAL HIGH

## 2023-08-03 LAB — ANA: Anti Nuclear Antibody (ANA): POSITIVE — AB

## 2023-08-04 ENCOUNTER — Other Ambulatory Visit: Payer: Self-pay | Admitting: Physician Assistant

## 2023-08-04 ENCOUNTER — Ambulatory Visit: Payer: Self-pay | Admitting: Primary Care

## 2023-08-04 DIAGNOSIS — Z8673 Personal history of transient ischemic attack (TIA), and cerebral infarction without residual deficits: Secondary | ICD-10-CM

## 2023-08-04 DIAGNOSIS — R519 Headache, unspecified: Secondary | ICD-10-CM

## 2023-08-04 DIAGNOSIS — R200 Anesthesia of skin: Secondary | ICD-10-CM

## 2023-08-04 DIAGNOSIS — G500 Trigeminal neuralgia: Secondary | ICD-10-CM

## 2023-08-05 NOTE — Telephone Encounter (Signed)
 I spoke with pt; pt said starting today has new symptoms of balance feeling off on and off, pt said feels wobbly when walking, Head feels heavy and at times feels like will pass out but that passes quickly and pt has not passed out. Pt has general weakness but no weakness in arms or legs, no CP,SOB or H/A. No vision changes.pt said couple of times today felt like heart speeded up for short period but not now.  Pt has not taken BP since seen on 08/01/23.pt is presently at work. No available appts at [facility] this afternoon and pt prefers to see Polly Brink.pt scheduled appt with Tresea Frost NP on 08/06/23 at 2:20 with UC & ED precautions and pt voiced understanding.pt will stay hydrated and plans to see Polly Brink on 08/06/23. Sending note to Tresea Frost NP and [facility].

## 2023-08-05 NOTE — Telephone Encounter (Signed)
 See patient's recent my chart message. Can we triage her?

## 2023-08-05 NOTE — Telephone Encounter (Signed)
 Noted, will evaluate.

## 2023-08-06 ENCOUNTER — Ambulatory Visit: Payer: Self-pay | Admitting: Primary Care

## 2023-08-06 ENCOUNTER — Ambulatory Visit: Admitting: Primary Care

## 2023-08-06 ENCOUNTER — Encounter: Payer: Self-pay | Admitting: Primary Care

## 2023-08-06 VITALS — BP 138/84 | HR 111 | Temp 97.2°F | Ht 66.0 in | Wt 174.0 lb

## 2023-08-06 DIAGNOSIS — R519 Headache, unspecified: Secondary | ICD-10-CM | POA: Diagnosis not present

## 2023-08-06 DIAGNOSIS — R768 Other specified abnormal immunological findings in serum: Secondary | ICD-10-CM | POA: Diagnosis not present

## 2023-08-06 DIAGNOSIS — R002 Palpitations: Secondary | ICD-10-CM

## 2023-08-06 DIAGNOSIS — I10 Essential (primary) hypertension: Secondary | ICD-10-CM

## 2023-08-06 MED ORDER — HYDROCHLOROTHIAZIDE 12.5 MG PO TABS: 12.5000 mg | ORAL_TABLET | Freq: Every day | ORAL | 0 refills | Status: DC

## 2023-08-06 NOTE — Assessment & Plan Note (Signed)
 Borderline high today, also during prior visits.  Stop amlodipine  10 mg due to ankle edema. Start hydrochlorothiazide 12.5 mg daily.  We will see her back in 2 weeks for BP check and BMP

## 2023-08-06 NOTE — Assessment & Plan Note (Signed)
 Proceed with MRI brain as recommended.

## 2023-08-06 NOTE — Patient Instructions (Signed)
 Stop taking amlodipine  for blood pressure.  Start taking hydrochlorothiazide 12.5 mg once daily for BP  Stop by the lab prior to leaving today. I will notify you of your results once received.   Please schedule a follow up visit to meet back with me in 2-3 weeks for blood pressure check.

## 2023-08-06 NOTE — Assessment & Plan Note (Signed)
 With two positive titers.  Checking ANA double stranded DNA and Anti-Smith antibodies today. Referral placed previously to rheumatology

## 2023-08-06 NOTE — Assessment & Plan Note (Addendum)
 Unclear cause.  ECG today with NSR, rate of 87. No acute ST changes or PAC/PVC. Appears similar to ECG from 2024  Recent labs without anemia or thyroid  abnormality.  Reviewed Zio monitor from 2023.  Checking additional labs today. Could be hypertension. Working to adjust meds. Consider referral back to cards.

## 2023-08-06 NOTE — Progress Notes (Signed)
 Subjective:    Patient ID: Ashley [name], female    DOB: October 10, 1965, 58 y.o.   MRN: [id number]  HPI  Zissel Biederman is a very pleasant 58 y.o. female with a history of osteoarthritis, chronic joint pain, hypertension, hyperlipidemia, tinnitus of both ears, generalized abdominal pain, TIA who presents today to discuss fatigue.  Symptom onset yesterday. She was sitting at work typing on the computer and felt a sudden onset of fatigue, losing her energy, feeling lightheaded, and palpitations/heart fluttering sensation. Symptoms occurred intermittently throughout her day regardless of rest or exertion. When she was walking she felt like she was losing her balance.   Today her symptoms have improved but she continues to notice intermittent palpitations/heart fluttering sensation and some fatigue with lightheadedness and imbalance. She denies changes in speech, facial dropping, room spinning sensation, vaginal bleeding, rectal bleeding.   She underwent MRI brain in April 2024 which was without acute abnormality or masses. She is following with neurology who has ordered a repeat MRI brain given left sided facial numbness/evaluation of trigeminal neuralgia.   She recently had positive ANA speckled 1:80 and cytoplasmic 1:40. Prior ANA testing in 2021 and 2023 was negative. CBC drawn last week was without anemia or leukocytosis.   She does have a prior history of palpitations, underwent cardiology work up in 2023, all negative. The only change she experienced recently was a steroid injection to her neck 2 weeks ago.   BP Readings from Last 3 Encounters:  08/06/23 138/84  08/01/23 (!) 146/86  05/30/23 138/86      Review of Systems  Constitutional:  Positive for fatigue.  Cardiovascular:  Positive for palpitations.  Neurological:  Positive for light-headedness and numbness.         Past Medical History:  Diagnosis Date   Abdominal pain 06/23/2018   Acute breast pain  01/30/2022   Acute midline low back pain without sciatica 09/10/2019   Acute right flank pain 06/29/2021   Adverse effect of drug 08/15/2019   Anemia    Bronchitis    2 years ago    [facility] arthritis 2019   base of left thumb    Constipation 04/07/2019   Discoloration of skin 05/17/2021   Diverticulitis of colon    GERD (gastroesophageal reflux disease)    diet controlled , no meds   Hydrosalpinx    bilateral    Hyperlipidemia    recent dx - currently diet control - no meds   Hypertension    Hypothyroid 2003   radio active iodine - no meds   Left-sided weakness 02/12/2019   Ovarian cyst, left 02/23/2016   Urinary frequency 11/30/2018   Vitamin B 12 deficiency     Social History   Socioeconomic History   Marital status: Single    Spouse name: Not on file   Number of children: Not on file   Years of education: Not on file   Highest education level: Not on file  Occupational History   Not on file  Tobacco Use   Smoking status: Never   Smokeless tobacco: Never  Vaping Use   Vaping status: Never Used  Substance and Sexual Activity   Alcohol use: No   Drug use: No   Sexual activity: Yes    Partners: Male    Birth control/protection: Surgical    Comment: hysterectomy.  Other Topics Concern   Not on file  Social History Narrative   Single.   3 children.   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   Enjoys relaxing, exercise, walking her dog.    Social Drivers of Corporate investment banker Strain: Not on file  Food Insecurity: Not on file  Transportation Needs: Not on file  Physical Activity: Not on file  Stress: Not on file  Social Connections: Unknown (11/21/2022)   Received from [facility]   Social Network    Social Network: Not on file  Intimate Partner Violence: Unknown (11/21/2022)   Received from [facility]   HITS    Physically Hurt: Not on file    Insult or Talk Down To: Not on file    Threaten Physical Harm: Not on file    Scream or Curse: Not on file     Past Surgical History:  Procedure Laterality Date   CESAREAN SECTION     x 2   CHOLECYSTECTOMY  2008   COLONOSCOPY WITH PROPOFOL  N/A 08/06/2021   Procedure: COLONOSCOPY WITH PROPOFOL ;  Surgeon: Marnee Sink, MD;  Location: [facility];  Service: Endoscopy;  Laterality: N/A;   DILATION AND CURETTAGE OF UTERUS     X 2   ESOPHAGOGASTRODUODENOSCOPY (EGD) WITH PROPOFOL  N/A 08/06/2021   Procedure: ESOPHAGOGASTRODUODENOSCOPY (EGD) WITH PROPOFOL ;  Surgeon: Marnee Sink, MD;  Location: [facility];  Service: Endoscopy;  Laterality: N/A;   LAPAROSCOPIC BILATERAL SALPINGECTOMY Bilateral 07/29/2017   Procedure: LAPAROSCOPIC LEFT SALPINGOOPHRECTOMY: Partial right Salpingectomy;  Surgeon: Lenord Radon, MD;  Location: [facility];  Service: Gynecology;  Laterality: Bilateral;   POLYPECTOMY  08/06/2021   Procedure: POLYPECTOMY;  Surgeon: Marnee Sink, MD;  Location: [facility];  Service: Endoscopy;;   SHOULDER SURGERY  2016   arthroscopic   TOTAL ABDOMINAL HYSTERECTOMY  2006   Fibroids, benign pathology   TUBAL LIGATION     WISDOM TOOTH EXTRACTION     at age 89 yr   WRIST ARTHROSCOPY Right 2007    Family History  Problem Relation Age of Onset   Hypertension Mother    Ovarian cancer Mother    Other Father        unknown medical history   Hypertension Maternal Grandmother    Heart failure Maternal Grandmother    Lung cancer Maternal Grandmother    Breast cancer Maternal Aunt    Colon cancer Neg Hx    Colon polyps Neg Hx    Rectal cancer Neg Hx    Stomach cancer Neg Hx     Allergies  Allergen Reactions   Aczone [Dapsone] Swelling   Levaquin  [Levofloxacin ]     Presumed cause of joint aches    Current Outpatient Medications on File Prior to Visit  Medication Sig Dispense Refill   aspirin  81 MG EC tablet Take by mouth.     atorvastatin  (LIPITOR) 10 MG tablet Take 1 tablet (10 mg total) by mouth daily. for cholesterol. 90 tablet 0    azelastine  (ASTELIN ) 0.1 % nasal spray Place into the nose.     fluticasone  (FLONASE ) 50 MCG/ACT nasal spray Place 1 spray into both nostrils 2 (two) times daily. 16 g 2   gabapentin (NEURONTIN) 600 MG tablet Take 1,200 mg by mouth daily.     pantoprazole  (PROTONIX ) 40 MG tablet Take 1 tablet (40 mg total) by mouth 2 (two) times daily. for heartburn. 180 tablet 0   valACYclovir  (VALTREX ) 500 MG tablet Take 1 tablet (500 mg total) by mouth daily. For suppression.  Take two tablets for 5 days in the event of an outbreak. 90 tablet 4  No current facility-administered medications on file prior to visit.    BP 138/84   Pulse (!) 111   Temp (!) 97.2 F (36.2 C) (Temporal)   Ht 5' 6 (1.676 m)   Wt 174 lb (78.9 kg)   SpO2 97%   BMI 28.08 kg/m  Objective:   Physical Exam Eyes:     Extraocular Movements: Extraocular movements intact.  Cardiovascular:     Rate and Rhythm: Normal rate and regular rhythm.  Pulmonary:     Effort: Pulmonary effort is normal.     Breath sounds: Normal breath sounds.  Musculoskeletal:     Cervical back: Neck supple.  Skin:    General: Skin is warm and dry.  Neurological:     Mental Status: She is alert and oriented to person, place, and time.     Cranial Nerves: No cranial nerve deficit.     Coordination: Coordination normal.  Psychiatric:        Mood and Affect: Mood normal.           Assessment & Plan:  Palpitations Assessment & Plan: Unclear cause.  ECG today with NSR, rate of 87. No acute ST changes or PAC/PVC. Appears similar to ECG from 2024  Recent labs without anemia or thyroid  abnormality.  Reviewed Zio monitor from 2023.  Checking additional labs today. Could be hypertension. Working to adjust meds. Consider referral back to cards.   Orders: -     EKG 12-Lead  Positive ANA (antinuclear antibody) Assessment & Plan: With two positive titers.  Checking ANA double stranded DNA and Anti-Smith antibodies today. Referral  placed previously to rheumatology   Orders: -     Anti-DNA antibody, double-stranded -     Anti-Smith antibody  Facial pain Assessment & Plan: Proceed with MRI brain as recommended.    Essential hypertension Assessment & Plan: Borderline high today, also during prior visits.  Stop amlodipine  10 mg due to ankle edema. Start hydrochlorothiazide 12.5 mg daily.  We will see her back in 2 weeks for BP check and BMP  Orders: -     hydroCHLOROthiazide; Take 1 tablet (12.5 mg total) by mouth daily. for blood pressure.  Dispense: 30 tablet; Refill: 0        Gabriel John, NP

## 2023-08-07 LAB — ANTI-SMITH ANTIBODY: ENA SM Ab Ser-aCnc: <1 AI

## 2023-08-07 LAB — ANTI-DNA ANTIBODY, DOUBLE-STRANDED: ds DNA Ab: <1 [IU]/mL

## 2023-08-15 ENCOUNTER — Ambulatory Visit
Admission: RE | Admit: 2023-08-15 | Discharge: 2023-08-15 | Disposition: A | Discharge status: Home / Self Care | Source: Ambulatory Visit | Attending: Physician Assistant | Admitting: Physician Assistant

## 2023-08-15 DIAGNOSIS — G500 Trigeminal neuralgia: Secondary | ICD-10-CM

## 2023-08-15 DIAGNOSIS — Z8673 Personal history of transient ischemic attack (TIA), and cerebral infarction without residual deficits: Secondary | ICD-10-CM

## 2023-08-15 DIAGNOSIS — R200 Anesthesia of skin: Secondary | ICD-10-CM

## 2023-08-15 DIAGNOSIS — R519 Headache, unspecified: Secondary | ICD-10-CM

## 2023-08-15 MED ORDER — GADOPICLENOL 0.5 MMOL/ML IV SOLN
7.5000 mL | Freq: Once | INTRAVENOUS | Status: AC | PRN
  Administered 2023-08-15: 7.5 mL via INTRAVENOUS

## 2023-08-20 ENCOUNTER — Ambulatory Visit: Payer: Self-pay | Admitting: Primary Care

## 2023-08-20 ENCOUNTER — Other Ambulatory Visit: Payer: Self-pay | Admitting: Primary Care

## 2023-08-20 ENCOUNTER — Encounter: Payer: Self-pay | Admitting: Primary Care

## 2023-08-20 ENCOUNTER — Ambulatory Visit: Admitting: Primary Care

## 2023-08-20 VITALS — BP 136/82 | HR 82 | Temp 97.2°F | Ht 66.0 in | Wt 174.0 lb

## 2023-08-20 DIAGNOSIS — I10 Essential (primary) hypertension: Secondary | ICD-10-CM | POA: Diagnosis not present

## 2023-08-20 DIAGNOSIS — G8929 Other chronic pain: Secondary | ICD-10-CM

## 2023-08-20 DIAGNOSIS — M79643 Pain in unspecified hand: Secondary | ICD-10-CM

## 2023-08-20 LAB — BASIC METABOLIC PANEL WITH GFR
BUN: 14 mg/dL (ref 6–23)
CO2: 30 meq/L (ref 19–32)
Calcium: 10 mg/dL (ref 8.4–10.5)
Chloride: 101 meq/L (ref 96–112)
Creatinine, Ser: 0.92 mg/dL (ref 0.40–1.20)
GFR: 68.64 mL/min (ref >60.00)
Glucose, Bld: 85 mg/dL (ref 70–99)
Potassium: 4.2 meq/L (ref 3.5–5.1)
Sodium: 139 meq/L (ref 135–145)

## 2023-08-20 MED ORDER — HYDROCHLOROTHIAZIDE 25 MG PO TABS
25.0000 mg | ORAL_TABLET | Freq: Every day | ORAL | 0 refills | Status: AC
Start: 2023-08-20 — End: ?

## 2023-08-20 NOTE — Assessment & Plan Note (Signed)
 Borderline high.   Continue hydrochlorothiazide  12.5 mg daily for now. Checking BMP today.  If BMP stable then increase hydrochlorothiazide  to 25 mg daily. She will monitor BP and send readings via MyChart in 2 weeks.

## 2023-08-20 NOTE — Patient Instructions (Signed)
 Stop by the lab prior to leaving today. I will notify you of your results once received.   Continue taking hydrochlorothiazide  12.5 mg once daily for blood pressure for now.  You can try diclofenac (Voltaren) gel for your hand pain. You can find this over the counter at any drug store, [employer], [employer], etc.  It was a pleasure to see you today!

## 2023-08-20 NOTE — Progress Notes (Addendum)
 Subjective:    Patient ID: Ashley [name], female    DOB: 02/02/1966, 58 y.o.   MRN: [id number]  HPI  Ashley [name] is a very pleasant 58 y.o. female with a history of hypertension, GERD, neuropathy, chronic joint pain, chronic back pain, frequent headaches who presents today for follow-up of hypertension and several concerns.   1) Hypertension: She was last evaluated on 08/06/2023 for various concerns including palpitations and high blood pressure readings.  Her amlodipine  10 mg daily was discontinued due to ankle edema and hydrochlorothiazide  12.5 mg daily was initiated.  She is here for follow-up today.  Since her last visit she is compliant to hydrochlorothiazide  12.5 mg daily. Her ankle edema has reduced. She has not checked her BP at home as she has recently moved.   Her headaches have improved.   BP Readings from Last 3 Encounters:  08/20/23 136/82  08/06/23 138/84  08/01/23 (!) 146/86   2) Joint Pain: To most joints on her right hand and bilateral feet. Her right hand pain woke her from sleep. Following with neurology for EMG testing. Also following with physiatry who recently referred her to neurosurgery for ongoing back pain. She has an appointment scheduled for next week.   She's tried Motrin , application of heat and ice.   Review of Systems  Respiratory:  Negative for shortness of breath.   Cardiovascular:  Negative for chest pain.  Musculoskeletal:  Positive for arthralgias and back pain.  Neurological:  Negative for headaches.         Past Medical History:  Diagnosis Date   Abdominal pain 06/23/2018   Acute breast pain 01/30/2022   Acute midline low back pain without sciatica 09/10/2019   Acute right flank pain 06/29/2021   Adverse effect of drug 08/15/2019   Anemia    Bronchitis    2 years ago    [facility] arthritis 2019   base of left thumb    Constipation 04/07/2019   Discoloration of skin 05/17/2021   Diverticulitis of colon    GERD  (gastroesophageal reflux disease)    diet controlled , no meds   Hydrosalpinx    bilateral    Hyperlipidemia    recent dx - currently diet control - no meds   Hypertension    Hypothyroid 2003   radio active iodine - no meds   Left-sided weakness 02/12/2019   Ovarian cyst, left 02/23/2016   Urinary frequency 11/30/2018   Vitamin B 12 deficiency     Social History   Socioeconomic History   Marital status: Single    Spouse name: Not on file   Number of children: Not on file   Years of education: Not on file   Highest education level: Not on file  Occupational History   Not on file  Tobacco Use   Smoking status: Never   Smokeless tobacco: Never  Vaping Use   Vaping status: Never Used  Substance and Sexual Activity   Alcohol use: No   Drug use: No   Sexual activity: Yes    Partners: Male    Birth control/protection: Surgical    Comment: hysterectomy.  Other Topics Concern   Not on file  Social History Narrative   Single.   3 children.   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   Enjoys relaxing, exercise, walking her dog.    Social Drivers of Corporate investment banker Strain: Not on file  Food Insecurity: Not on file  Transportation Needs: Not  on file  Physical Activity: Not on file  Stress: Not on file  Social Connections: Unknown (11/21/2022)   Received from [facility]   Social Network    Social Network: Not on file  Intimate Partner Violence: Unknown (11/21/2022)   Received from [facility]   HITS    Physically Hurt: Not on file    Insult or Talk Down To: Not on file    Threaten Physical Harm: Not on file    Scream or Curse: Not on file    Past Surgical History:  Procedure Laterality Date   CESAREAN SECTION     x 2   CHOLECYSTECTOMY  2008   COLONOSCOPY WITH PROPOFOL  N/A 08/06/2021   Procedure: COLONOSCOPY WITH PROPOFOL ;  Surgeon: Jinny Carmine, MD;  Location: [facility];  Service: Endoscopy;  Laterality: N/A;   DILATION AND CURETTAGE OF UTERUS      X 2   ESOPHAGOGASTRODUODENOSCOPY (EGD) WITH PROPOFOL  N/A 08/06/2021   Procedure: ESOPHAGOGASTRODUODENOSCOPY (EGD) WITH PROPOFOL ;  Surgeon: Jinny Carmine, MD;  Location: [facility];  Service: Endoscopy;  Laterality: N/A;   LAPAROSCOPIC BILATERAL SALPINGECTOMY Bilateral 07/29/2017   Procedure: LAPAROSCOPIC LEFT SALPINGOOPHRECTOMY: Partial right Salpingectomy;  Surgeon: Corene Coy, MD;  Location: [facility];  Service: Gynecology;  Laterality: Bilateral;   POLYPECTOMY  08/06/2021   Procedure: POLYPECTOMY;  Surgeon: Jinny Carmine, MD;  Location: [facility];  Service: Endoscopy;;   SHOULDER SURGERY  2016   arthroscopic   TOTAL ABDOMINAL HYSTERECTOMY  2006   Fibroids, benign pathology   TUBAL LIGATION     WISDOM TOOTH EXTRACTION     at age 11 yr   WRIST ARTHROSCOPY Right 2007    Family History  Problem Relation Age of Onset   Hypertension Mother    Ovarian cancer Mother    Other Father        unknown medical history   Hypertension Maternal Grandmother    Heart failure Maternal Grandmother    Lung cancer Maternal Grandmother    Breast cancer Maternal Aunt    Colon cancer Neg Hx    Colon polyps Neg Hx    Rectal cancer Neg Hx    Stomach cancer Neg Hx     Allergies  Allergen Reactions   Aczone [Dapsone] Swelling   Levaquin  [Levofloxacin ]     Presumed cause of joint aches    Current Outpatient Medications on File Prior to Visit  Medication Sig Dispense Refill   aspirin  81 MG EC tablet Take by mouth.     atorvastatin  (LIPITOR) 10 MG tablet Take 1 tablet (10 mg total) by mouth daily. for cholesterol. 90 tablet 0   azelastine  (ASTELIN ) 0.1 % nasal spray Place into the nose.     fluticasone  (FLONASE ) 50 MCG/ACT nasal spray Place 1 spray into both nostrils 2 (two) times daily. 16 g 2   gabapentin (NEURONTIN) 600 MG tablet Take 1,200 mg by mouth daily.     hydrochlorothiazide  (HYDRODIURIL ) 12.5 MG tablet Take 1 tablet (12.5 mg total) by mouth  daily. for blood pressure. 30 tablet 0   pantoprazole  (PROTONIX ) 40 MG tablet Take 1 tablet (40 mg total) by mouth 2 (two) times daily. for heartburn. 180 tablet 0   valACYclovir  (VALTREX ) 500 MG tablet Take 1 tablet (500 mg total) by mouth daily. For suppression.  Take two tablets for 5 days in the event of an outbreak. 90 tablet 4   No current facility-administered medications on file prior to visit.    BP 136/82  Pulse 82   Temp (!) 97.2 F (36.2 C) (Temporal)   Ht 5' 6 (1.676 m)   Wt 174 lb (78.9 kg)   SpO2 99%   BMI 28.08 kg/m  Objective:   Physical Exam  Cardiovascular:     Rate and Rhythm: Normal rate and regular rhythm.  Pulmonary:     Effort: Pulmonary effort is normal.     Breath sounds: Normal breath sounds.   Musculoskeletal:     Cervical back: Neck supple.   Skin:    General: Skin is warm and dry.   Neurological:     Mental Status: She is alert and oriented to person, place, and time.   Psychiatric:        Mood and Affect: Mood normal.           Assessment & Plan:  Essential hypertension Assessment & Plan: Borderline high.   Continue hydrochlorothiazide  12.5 mg daily for now. Checking BMP today.  If BMP stable then increase hydrochlorothiazide  to 25 mg daily. She will monitor BP and send readings via MyChart in 2 weeks.   Orders: -     Basic metabolic panel with GFR  Chronic hand pain, unspecified laterality Assessment & Plan: Ongoing.  Likely osteoarthritis has she has tested negative for RA and gout. Discussed Voltaren gel PRN.         Amandalee Lacap K Marsena Taff, NP

## 2023-08-20 NOTE — Assessment & Plan Note (Signed)
 Ongoing.  Likely osteoarthritis has she has tested negative for RA and gout. Discussed Voltaren gel PRN.

## 2023-08-28 NOTE — Progress Notes (Signed)
 Referring Physician:  Dodson Delon FERNS, MD [address] [place],  [place] [address]  Primary Physician:  Gretta Comer POUR, NP  History of Present Illness: 09/01/2023 Ashley [name] is here today with a chief complaint of back pain radiating predominantly into the left lower extremity with numbness tingling and often bilateral lower extremity pain.  She has been managed by Dr. Mozelle.  She has been having decreased effects of her epidural spinal injections.  She did have physical therapy in the spring of this year from March to April of 2025.  She continues to have worsening pain.  She feels like this is interfering with her quality of life.  She is not having any new issues with her bowel or bladder.  Conservative measures:  Physical therapy:  has participated in PT March-April 2025 Multimodal medical therapy including regular antiinflammatories:  Gabapentin Injections:   10/14/2022-bilateral L5-S1 TFESI with [facility] 01/30/2023-right L5-S1 TFESI with [facility] 07/31/2023-bilateral L5-S1 TFESI  03/26/2022-right hip injection  03/28/2023-right hip injection  Past Surgery: no spinal surgeries  The symptoms are causing a significant impact on the patient's life.   I have utilized the care everywhere function in epic to review the outside records available from external health systems.  Review of Systems:  A 10 point review of systems is negative, except for the pertinent positives and negatives detailed in the HPI.  Past Medical History: Past Medical History:  Diagnosis Date   Abdominal pain 06/23/2018   Acute breast pain 01/30/2022   Acute midline low back pain without sciatica 09/10/2019   Acute right flank pain 06/29/2021   Adverse effect of drug 08/15/2019   Anemia    Bronchitis    2 years ago    [facility] arthritis 2019   base of left thumb    Constipation 04/07/2019   Discoloration of skin 05/17/2021   Diverticulitis of colon    GERD (gastroesophageal  reflux disease)    diet controlled , no meds   Hydrosalpinx    bilateral    Hyperlipidemia    recent dx - currently diet control - no meds   Hypertension    Hypothyroid 2003   radio active iodine - no meds   Left-sided weakness 02/12/2019   Ovarian cyst, left 02/23/2016   Urinary frequency 11/30/2018   Vitamin B 12 deficiency     Past Surgical History: Past Surgical History:  Procedure Laterality Date   CESAREAN SECTION     x 2   CHOLECYSTECTOMY  2008   COLONOSCOPY WITH PROPOFOL  N/A 08/06/2021   Procedure: COLONOSCOPY WITH PROPOFOL ;  Surgeon: Jinny Carmine, MD;  Location: [facility];  Service: Endoscopy;  Laterality: N/A;   DILATION AND CURETTAGE OF UTERUS     X 2   ESOPHAGOGASTRODUODENOSCOPY (EGD) WITH PROPOFOL  N/A 08/06/2021   Procedure: ESOPHAGOGASTRODUODENOSCOPY (EGD) WITH PROPOFOL ;  Surgeon: Jinny Carmine, MD;  Location: [facility];  Service: Endoscopy;  Laterality: N/A;   LAPAROSCOPIC BILATERAL SALPINGECTOMY Bilateral 07/29/2017   Procedure: LAPAROSCOPIC LEFT SALPINGOOPHRECTOMY: Partial right Salpingectomy;  Surgeon: Corene Coy, MD;  Location: [facility];  Service: Gynecology;  Laterality: Bilateral;   POLYPECTOMY  08/06/2021   Procedure: POLYPECTOMY;  Surgeon: Jinny Carmine, MD;  Location: [facility];  Service: Endoscopy;;   SHOULDER SURGERY  2016   arthroscopic   TOTAL ABDOMINAL HYSTERECTOMY  2006   Fibroids, benign pathology   TUBAL LIGATION     WISDOM TOOTH EXTRACTION     at age 70 yr  WRIST ARTHROSCOPY Right 2007    Allergies: Allergies as of 09/01/2023 - Review Complete 09/01/2023  Allergen Reaction Noted   Aczone [dapsone] Swelling 07/24/2021   Levaquin  [levofloxacin ]  08/12/2019    Medications:  Current Outpatient Medications:    aspirin  81 MG EC tablet, Take by mouth., Disp: , Rfl:    atorvastatin  (LIPITOR) 10 MG tablet, Take 1 tablet (10 mg total) by mouth daily. for cholesterol., Disp: 90  tablet, Rfl: 0   azelastine  (ASTELIN ) 0.1 % nasal spray, Place into the nose., Disp: , Rfl:    fluticasone  (FLONASE ) 50 MCG/ACT nasal spray, Place 1 spray into both nostrils 2 (two) times daily., Disp: 16 g, Rfl: 2   gabapentin (NEURONTIN) 600 MG tablet, Take 1,200 mg by mouth daily., Disp: , Rfl:    hydrochlorothiazide  (HYDRODIURIL ) 25 MG tablet, Take 1 tablet (25 mg total) by mouth daily. for blood pressure., Disp: 90 tablet, Rfl: 0   pantoprazole  (PROTONIX ) 40 MG tablet, Take 1 tablet (40 mg total) by mouth 2 (two) times daily. for heartburn., Disp: 180 tablet, Rfl: 0   valACYclovir  (VALTREX ) 500 MG tablet, Take 1 tablet (500 mg total) by mouth daily. For suppression.  Take two tablets for 5 days in the event of an outbreak., Disp: 90 tablet, Rfl: 4  Social History: Social History   Tobacco Use   Smoking status: Never   Smokeless tobacco: Never  Vaping Use   Vaping status: Never Used  Substance Use Topics   Alcohol use: No   Drug use: No    Family Medical History: Family History  Problem Relation Age of Onset   Hypertension Mother    Ovarian cancer Mother    Other Father        unknown medical history   Hypertension Maternal Grandmother    Heart failure Maternal Grandmother    Lung cancer Maternal Grandmother    Breast cancer Maternal Aunt    Colon cancer Neg Hx    Colon polyps Neg Hx    Rectal cancer Neg Hx    Stomach cancer Neg Hx     Physical Examination: Vitals:   09/01/23 1313  BP: 134/86    Strength:  Side Iliopsoas Quads Hamstring PF DF EHL  R 5 5 5 5 5 5   L 5 5 5 5 5  4+    Trace medial hamstring right 1 on left, 2+ at knees  SLR + on left.      No evidence of dysmetria noted.  Gait is normal.    Imaging: Narrative & Impression  CLINICAL DATA:  Lumbar radiculopathy bilaterally   EXAM: MRI LUMBAR SPINE WITHOUT CONTRAST   TECHNIQUE: Multiplanar, multisequence MR imaging of the lumbar spine was performed. No intravenous contrast was  administered.   COMPARISON:  04/17/2022   FINDINGS: Segmentation:  Standard.   Alignment:  Stable.  No significant listhesis.   Vertebrae:  Stable vertebral body heights.  No new marrow edema.   Conus medullaris and cauda equina: Conus extends to the L1-L2 level. Conus and cauda equina appear normal.   Paraspinal and other soft tissues: Unremarkable.   Disc levels:   L1-L2:  Trace central disc protrusion.  No stenosis.   L2-L3:  No stenosis.   L3-L4:  Minor facet arthropathy.  No stenosis.   L4-L5: Disc bulge. Facet arthropathy with persistent joint effusions. No canal stenosis. Partial effacement of the subarticular recesses. Mild foraminal stenosis.   L5-S1:  No stenosis.   IMPRESSION: Degenerative changes primarily at L4-L5 similar  to the prior study. No new or progressive stenosis.     Electronically Signed   By: Santina Blanch M.D.   On: 07/17/2023 08:59   I have personally reviewed the images and agree with the above interpretation.  Medical Decision Making/Assessment and Plan: Ashley [name] is a pleasant 58 y.o. female with signs and symptoms consistent with lumbar radiculopathy and claudication.  She has a history of an L4-5 spondylosis who she has been treated with multiple rounds of injections and physical therapy.  She is here for evaluation.  She has noted some mild weakness in her left lower extremity and feels tripping.  I only noticed a mild weakness in her left EHL.  She does have a slight decrease in her reflexes indicating possible radicular compression.  Her imaging demonstrated some lateral recess compression at L4-5, however she does have significant splaying of her facet joints bilaterally with fluid signal.  Because of this I like to evaluate her for mobile spondylolisthesis.  She does state that she gets positional back pain and mechanical back pain that is worse while she is upright and better in recumbency.  Her left leg is the worst.  She does state  that her pain is approximately 50-50 back and leg.  Once we have the x-rays and evaluate for any mobile spondylolisthesis we can discuss whether or not she would be best treated with a decompressive procedure versus a decompression and fusion in the setting of her slight spondylolisthesis that could be worsening on her flexion-extension films.  Penne MICAEL Sharps MD/MSCR Neurosurgery

## 2023-09-01 ENCOUNTER — Ambulatory Visit
Admission: RE | Admit: 2023-09-01 | Discharge: 2023-09-01 | Disposition: A | Discharge status: Home / Self Care | Source: Ambulatory Visit | Attending: Neurosurgery | Admitting: Neurosurgery

## 2023-09-01 ENCOUNTER — Ambulatory Visit: Admitting: Neurosurgery

## 2023-09-01 ENCOUNTER — Ambulatory Visit
Admission: RE | Admit: 2023-09-01 | Discharge: 2023-09-01 | Disposition: A | Discharge status: Home / Self Care | Attending: Neurosurgery | Admitting: Neurosurgery

## 2023-09-01 ENCOUNTER — Encounter: Payer: Self-pay | Admitting: Neurosurgery

## 2023-09-01 VITALS — BP 134/86 | Ht 66.0 in | Wt 174.0 lb

## 2023-09-01 DIAGNOSIS — G8929 Other chronic pain: Secondary | ICD-10-CM | POA: Insufficient documentation

## 2023-09-01 DIAGNOSIS — R292 Abnormal reflex: Secondary | ICD-10-CM | POA: Diagnosis not present

## 2023-09-01 DIAGNOSIS — M4316 Spondylolisthesis, lumbar region: Secondary | ICD-10-CM | POA: Insufficient documentation

## 2023-09-01 DIAGNOSIS — M549 Dorsalgia, unspecified: Secondary | ICD-10-CM

## 2023-09-01 DIAGNOSIS — M5442 Lumbago with sciatica, left side: Secondary | ICD-10-CM | POA: Insufficient documentation

## 2023-09-01 DIAGNOSIS — M4726 Other spondylosis with radiculopathy, lumbar region: Secondary | ICD-10-CM | POA: Diagnosis not present

## 2023-09-01 DIAGNOSIS — M5441 Lumbago with sciatica, right side: Secondary | ICD-10-CM | POA: Insufficient documentation

## 2023-09-10 ENCOUNTER — Ambulatory Visit: Payer: Self-pay | Admitting: Neurosurgery

## 2023-09-11 NOTE — Telephone Encounter (Signed)
 Patient is scheduled for 09/26/2023 and added to the cancellation list.

## 2023-09-11 NOTE — Telephone Encounter (Signed)
 Left message for patient to call our office back.

## 2023-09-15 ENCOUNTER — Ambulatory Visit: Admission: EM | Admit: 2023-09-15 | Discharge: 2023-09-15 | Disposition: A | Discharge status: Home / Self Care

## 2023-09-15 DIAGNOSIS — Z8719 Personal history of other diseases of the digestive system: Secondary | ICD-10-CM | POA: Insufficient documentation

## 2023-09-15 DIAGNOSIS — R109 Unspecified abdominal pain: Secondary | ICD-10-CM | POA: Insufficient documentation

## 2023-09-15 DIAGNOSIS — I10 Essential (primary) hypertension: Secondary | ICD-10-CM | POA: Insufficient documentation

## 2023-09-15 LAB — CBC WITH DIFFERENTIAL/PLATELET
Abs Immature Granulocytes: 0.01 K/uL (ref 0.00–0.07)
Basophils Absolute: 0 K/uL (ref 0.0–0.1)
Basophils Relative: 1 %
Eosinophils Absolute: 0 K/uL (ref 0.0–0.5)
Eosinophils Relative: 1 %
HCT: 41.1 % (ref 36.0–46.0)
Hemoglobin: 13.7 g/dL (ref 12.0–15.0)
Immature Granulocytes: 0 %
Lymphocytes Relative: 42 %
Lymphs Abs: 1.9 K/uL (ref 0.7–4.0)
MCH: 27.7 pg (ref 26.0–34.0)
MCHC: 33.3 g/dL (ref 30.0–36.0)
MCV: 83 fL (ref 80.0–100.0)
Monocytes Absolute: 0.3 K/uL (ref 0.1–1.0)
Monocytes Relative: 8 %
Neutro Abs: 2.1 K/uL (ref 1.7–7.7)
Neutrophils Relative %: 48 %
Platelets: 279 K/uL (ref 150–400)
RBC: 4.95 MIL/uL (ref 3.87–5.11)
RDW: 14.9 % (ref 11.5–15.5)
WBC: 4.4 K/uL (ref 4.0–10.5)
nRBC: 0 % (ref 0.0–0.2)

## 2023-09-15 LAB — URINALYSIS, W/ REFLEX TO CULTURE (INFECTION SUSPECTED)
Bilirubin Urine: NEGATIVE
Glucose, UA: NEGATIVE mg/dL
Hgb urine dipstick: NEGATIVE
Ketones, ur: NEGATIVE mg/dL
Leukocytes,Ua: NEGATIVE
Nitrite: NEGATIVE
Protein, ur: NEGATIVE mg/dL
Specific Gravity, Urine: <1.005 — ABNORMAL LOW (ref 1.005–1.030)
pH: 6 (ref 5.0–8.0)

## 2023-09-15 LAB — COMPREHENSIVE METABOLIC PANEL WITH GFR
ALT: 17 U/L (ref 0–44)
AST: 31 U/L (ref 15–41)
Albumin: 4.7 g/dL (ref 3.5–5.0)
Alkaline Phosphatase: 82 U/L (ref 38–126)
Anion gap: 13 (ref 5–15)
BUN: 14 mg/dL (ref 6–20)
CO2: 26 mmol/L (ref 22–32)
Calcium: 10.1 mg/dL (ref 8.9–10.3)
Chloride: 98 mmol/L (ref 98–111)
Creatinine, Ser: 0.79 mg/dL (ref 0.44–1.00)
GFR, Estimated: >60 mL/min (ref >60)
Glucose, Bld: 103 mg/dL — ABNORMAL HIGH (ref 70–99)
Potassium: 3.8 mmol/L (ref 3.5–5.1)
Sodium: 137 mmol/L (ref 135–145)
Total Bilirubin: 0.6 mg/dL (ref 0.0–1.2)
Total Protein: 8.7 g/dL — ABNORMAL HIGH (ref 6.5–8.1)

## 2023-09-15 LAB — LIPASE, BLOOD: Lipase: 32 U/L (ref 11–51)

## 2023-09-15 NOTE — Discharge Instructions (Signed)
-   You do not have a urinary tract infection and your lab work all looks good. - Reach out to your GI specialist if you are still having continued abdominal pain as we may be able to order a CT scan for you. - At this time continue to hydrate well.  Take Tylenol  as needed for pain relief.  If you feel that your symptoms could be due to constipation consider a gentle laxative such as senna. - If you have a fever or acute worsening of abdominal pain or weakness please go to the ER.

## 2023-09-15 NOTE — ED Provider Notes (Signed)
 [facility]    CSN: [id number] Arrival date & time: 09/15/23  1836      History   Chief Complaint Chief Complaint  Patient presents with   Flank Pain   Abdominal Pain    HPI Ashley [name] is a 58 y.o. female presenting for suprapubic discomfort as well as discomfort across the upper abdomen, especially the right upper quadrant.  Symptoms have been ongoing x 2 to 3 days.  Mild urinary frequency which she attributes to her diabetes medicine. She has been a bit constipated but did have a BM recently.  Denies black or bloody stool.  No fever, fatigue, reduced appetite, diarrhea, nausea/vomiting, dysuria, urinary urgency, hematuria. Medical history is significant for diverticulitis and she says this sort of feels like how her diverticulitis starts. Surgical history is significant for cholecystectomy, abdominal hysterectomy, bilateral salpingectomy, and polypectomy. Patient concerned for UTI.  HPI  Past Medical History:  Diagnosis Date   Abdominal pain 06/23/2018   Acute breast pain 01/30/2022   Acute midline low back pain without sciatica 09/10/2019   Acute right flank pain 06/29/2021   Adverse effect of drug 08/15/2019   Anemia    Bronchitis    2 years ago    [facility] arthritis 2019   base of left thumb    Constipation 04/07/2019   Discoloration of skin 05/17/2021   Diverticulitis of colon    GERD (gastroesophageal reflux disease)    diet controlled , no meds   Hydrosalpinx    bilateral    Hyperlipidemia    recent dx - currently diet control - no meds   Hypertension    Hypothyroid 2003   radio active iodine - no meds   Left-sided weakness 02/12/2019   Ovarian cyst, left 02/23/2016   Urinary frequency 11/30/2018   Vitamin B 12 deficiency     Patient Active Problem List   Diagnosis Date Noted   Positive ANA (antinuclear antibody) 08/06/2023   Scalp itch 08/01/2023   Facial pain 08/01/2023   Chronic hip pain, right 05/30/2023   Upper extremity pain,  anterior, left 12/12/2022   Screening examination for STI 11/07/2022   Frequent headaches 06/11/2022   Chronic pain of right knee 01/30/2022   TMJ (temporomandibular joint syndrome) 09/07/2021   Polyp of sigmoid colon    Left lower quadrant abdominal pain 06/29/2021   History of diverticulitis 06/29/2021   Adjustment reaction with anxiety and depression 05/09/2021   Osteoarthritis of carpometacarpal (CMC) joint of thumb 04/09/2021   Pain of right thumb 04/09/2021   Persistent cough for 3 weeks or longer 10/04/2020   Palpitations 10/04/2020   Prediabetes 06/15/2020   Chronic hand pain 05/10/2020   Neuropathy 12/13/2019   Lumbar stenosis without neurogenic claudication 12/09/2019   Cervical radiculitis 11/19/2019   Foraminal stenosis of cervical region 11/19/2019   Dizziness 10/07/2019   Chronic bilateral low back pain 09/10/2019   HSV (herpes simplex virus) anogenital infection 06/25/2019   Encounter for annual general medical examination with abnormal findings in adult 06/23/2019   Chronic neck pain 02/17/2019   Essential hypertension    History of TIA (transient ischemic attack)    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   Numbness and tingling 02/01/2019   Urinary frequency 11/30/2018   Ganglion cyst of tendon sheath of left hand 11/13/2018   Acquired trigger finger 10/26/2018   Osteoarthritis 06/30/2018   Vitamin D  deficiency 06/23/2018   Vitamin B 12 deficiency 06/23/2018   Generalized abdominal pain 06/23/2018   GERD (gastroesophageal  reflux disease) 01/30/2018   Hyperlipidemia 03/19/2017   Allergic rhinitis 03/19/2017   Injury of right shoulder 04/18/2015   Impingement syndrome of left shoulder 12/27/2014   Impingement syndrome of right shoulder 12/27/2014   Chronic right shoulder pain 11/29/2014    Past Surgical History:  Procedure Laterality Date   CESAREAN SECTION     x 2   CHOLECYSTECTOMY  2008   COLONOSCOPY WITH PROPOFOL  N/A 08/06/2021   Procedure: COLONOSCOPY  WITH PROPOFOL ;  Surgeon: Jinny Carmine, MD;  Location: [facility];  Service: Endoscopy;  Laterality: N/A;   DILATION AND CURETTAGE OF UTERUS     X 2   ESOPHAGOGASTRODUODENOSCOPY (EGD) WITH PROPOFOL  N/A 08/06/2021   Procedure: ESOPHAGOGASTRODUODENOSCOPY (EGD) WITH PROPOFOL ;  Surgeon: Jinny Carmine, MD;  Location: [facility];  Service: Endoscopy;  Laterality: N/A;   LAPAROSCOPIC BILATERAL SALPINGECTOMY Bilateral 07/29/2017   Procedure: LAPAROSCOPIC LEFT SALPINGOOPHRECTOMY: Partial right Salpingectomy;  Surgeon: Corene Coy, MD;  Location: [facility] [place];  Service: Gynecology;  Laterality: Bilateral;   POLYPECTOMY  08/06/2021   Procedure: POLYPECTOMY;  Surgeon: Jinny Carmine, MD;  Location: [facility];  Service: Endoscopy;;   SHOULDER SURGERY  2016   arthroscopic   TOTAL ABDOMINAL HYSTERECTOMY  2006   Fibroids, benign pathology   TUBAL LIGATION     WISDOM TOOTH EXTRACTION     at age 39 yr   WRIST ARTHROSCOPY Right 2007    OB History     Gravida  5   Para  3   Term  3   Preterm  0   AB  2   Living  3      SAB  2   IAB  0   Ectopic  0   Multiple  0   Live Births  3            Home Medications    Prior to Admission medications   Medication Sig Start Date End Date Taking? Authorizing Provider  clindamycin (CLEOCIN T) 1 % external solution SMARTSIG:Topical 09/11/23  Yes [provider]  hydrocortisone 2.5 % cream SMARTSIG:Topical 09/15/23  Yes [provider]  ketoconazole (NIZORAL) 2 % cream SMARTSIG:1 Application Topical 1 to 2 Times Daily 09/15/23  Yes [provider]  aspirin  81 MG EC tablet Take by mouth. 09/16/19   [provider]  atorvastatin  (LIPITOR) 10 MG tablet Take 1 tablet (10 mg total) by mouth daily. for cholesterol. 01/08/23   Clark, Katherine K, NP  azelastine  (ASTELIN ) 0.1 % nasal spray Place into the nose. 03/11/19   [provider]  fluticasone  (FLONASE ) 50  MCG/ACT nasal spray Place 1 spray into both nostrils 2 (two) times daily. 11/19/19   Tower, Laine LABOR, MD  gabapentin (NEURONTIN) 600 MG tablet Take 1,200 mg by mouth daily. 01/25/20   [provider]  hydrochlorothiazide  (HYDRODIURIL ) 25 MG tablet Take 1 tablet (25 mg total) by mouth daily. for blood pressure. 08/20/23   Clark, Katherine K, NP  pantoprazole  (PROTONIX ) 40 MG tablet Take 1 tablet (40 mg total) by mouth 2 (two) times daily. for heartburn. 06/20/23 06/19/24  Clark, Katherine K, NP  valACYclovir  (VALTREX ) 500 MG tablet Take 1 tablet (500 mg total) by mouth daily. For suppression.  Take two tablets for 5 days in the event of an outbreak. 03/06/23   Herchel Gloris LABOR, MD    Family History Family History  Problem Relation Age of Onset   Hypertension Mother    Ovarian cancer Mother  Other Father        unknown medical history   Hypertension Maternal Grandmother    Heart failure Maternal Grandmother    Lung cancer Maternal Grandmother    Breast cancer Maternal Aunt    Colon cancer Neg Hx    Colon polyps Neg Hx    Rectal cancer Neg Hx    Stomach cancer Neg Hx     Social History Social History   Tobacco Use   Smoking status: Never   Smokeless tobacco: Never  Vaping Use   Vaping status: Never Used  Substance Use Topics   Alcohol use: No   Drug use: No     Allergies   Aczone [dapsone] and Levaquin  [levofloxacin ]   Review of Systems Review of Systems  Constitutional:  Negative for activity change, appetite change, fatigue and fever.  HENT:  Negative for congestion.   Respiratory:  Negative for cough and shortness of breath.   Cardiovascular:  Negative for chest pain.  Gastrointestinal:  Positive for abdominal pain and constipation. Negative for abdominal distention, anal bleeding, blood in stool, diarrhea, nausea, rectal pain and vomiting.  Genitourinary:  Positive for frequency. Negative for difficulty urinating, dysuria, flank pain, hematuria, urgency and  vaginal discharge.  Musculoskeletal:  Negative for back pain.  Neurological:  Negative for weakness.     Physical Exam Triage Vital Signs ED Triage Vitals  Encounter Vitals Group     BP 09/15/23 1907 (!) 170/108     Girls Systolic BP Percentile --      Girls Diastolic BP Percentile --      Boys Systolic BP Percentile --      Boys Diastolic BP Percentile --      Pulse Rate 09/15/23 1907 90     Resp 09/15/23 1907 16     Temp 09/15/23 1907 99.2 F (37.3 C)     Temp Source 09/15/23 1907 Oral     SpO2 09/15/23 1907 94 %     Weight 09/15/23 1906 174 lb (78.9 kg)     Height 09/15/23 1906 5' 6 (1.676 m)     Head Circumference --      Peak Flow --      Pain Score 09/15/23 1910 6     Pain Loc --      Pain Education --      Exclude from Growth Chart --    No data found.  Updated Vital Signs BP (!) 158/90 (BP Location: Left Arm)   Pulse 90   Temp 99.2 F (37.3 C) (Oral)   Resp 16   Ht 5' 6 (1.676 m)   Wt 174 lb (78.9 kg)   SpO2 94%   BMI 28.08 kg/m      Physical Exam Vitals and nursing note reviewed.  Constitutional:      General: She is not in acute distress.    Appearance: Normal appearance. She is not ill-appearing or toxic-appearing.  HENT:     Head: Normocephalic and atraumatic.     Nose: Nose normal.     Mouth/Throat:     Mouth: Mucous membranes are moist.     Pharynx: Oropharynx is clear.  Eyes:     General: No scleral icterus.       Right eye: No discharge.        Left eye: No discharge.     Conjunctiva/sclera: Conjunctivae normal.  Cardiovascular:     Rate and Rhythm: Normal rate and regular rhythm.     Heart sounds: Normal heart  sounds.  Pulmonary:     Effort: Pulmonary effort is normal. No respiratory distress.     Breath sounds: Normal breath sounds.  Abdominal:     Tenderness: There is abdominal tenderness in the right upper quadrant, epigastric area, suprapubic area and left upper quadrant.  Musculoskeletal:     Cervical back: Neck supple.   Skin:    General: Skin is dry.  Neurological:     General: No focal deficit present.     Mental Status: She is alert. Mental status is at baseline.     Motor: No weakness.     Gait: Gait normal.  Psychiatric:        Mood and Affect: Mood normal.        Behavior: Behavior normal.        Thought Content: Thought content normal.      UC Treatments / Results  Labs (all labs ordered are listed, but only abnormal results are displayed) Labs Reviewed  URINALYSIS, W/ REFLEX TO CULTURE (INFECTION SUSPECTED) - Abnormal; Notable for the following components:      Result Value   Color, Urine STRAW (*)    Specific Gravity, Urine <1.005 (*)    Bacteria, UA RARE (*)    All other components within normal limits  COMPREHENSIVE METABOLIC PANEL WITH GFR - Abnormal; Notable for the following components:   Glucose, Bld 103 (*)    Total Protein 8.7 (*)    All other components within normal limits  LIPASE, BLOOD  CBC WITH DIFFERENTIAL/PLATELET    EKG   Radiology No results found.  Procedures Procedures (including critical care time)  Medications Ordered in UC Medications - No data to display  Initial Impression / Assessment and Plan / UC Course  I have reviewed the triage vital signs and the nursing notes.  Pertinent labs & imaging results that were available during my care of the patient were reviewed by me and considered in my medical decision making (see chart for details).   Urinalysis clear.  CBC and CMP without any significant acute abnormalities.  *** Final Clinical Impressions(s) / UC Diagnoses   Final diagnoses:  Abdominal pain, unspecified abdominal location     Discharge Instructions      - You do not have a urinary tract infection and your lab work all looks good. - Reach out to your GI specialist if you are still having continued abdominal pain as we may be able to order a CT scan for you. - At this time continue to hydrate well.  Take Tylenol  as needed for  pain relief.  If you feel that your symptoms could be due to constipation consider a gentle laxative such as senna. - If you have a fever or acute worsening of abdominal pain or weakness please go to the ER.     ED Prescriptions   None    PDMP not reviewed this encounter.

## 2023-09-15 NOTE — ED Triage Notes (Signed)
 Pt c/o R sided flank pain & lower abd pain x3 days. Denies any falls or injuries. Concerned for UTI. No OTC meds.

## 2023-09-26 ENCOUNTER — Encounter: Payer: Self-pay | Admitting: Neurosurgery

## 2023-09-26 ENCOUNTER — Ambulatory Visit: Admitting: Neurosurgery

## 2023-09-26 VITALS — BP 138/92 | Ht 66.0 in | Wt 174.0 lb

## 2023-09-26 DIAGNOSIS — M4726 Other spondylosis with radiculopathy, lumbar region: Secondary | ICD-10-CM

## 2023-09-26 DIAGNOSIS — M4316 Spondylolisthesis, lumbar region: Secondary | ICD-10-CM

## 2023-09-26 NOTE — Progress Notes (Signed)
 Referring Physician:  Gretta Comer POUR, NP [address] [place],  [place] [address]  Primary Physician:  Gretta Comer POUR, NP  History of Present Illness: 09/26/2023 Following up today after electrodiagnostic studies.  Major nerves are fine.  She has had previous injections and had longer recovery with the injections.  However now she starting to get much shorter symptom relief.  She has had prior PT.  She states that her legs are the most bothersome issue especially when she has been standing up a lot or bending a lot.  She at this point is not interested in surgical intervention and would like to continue with her conservative care.  She does not want to go to chiropractor.  Conservative measures:  Physical therapy:  has participated in PT March-April 2025 Multimodal medical therapy including regular antiinflammatories:  Gabapentin Injections:   10/14/2022-bilateral L5-S1 TFESI with [facility] 01/30/2023-right L5-S1 TFESI with [facility] 07/31/2023-bilateral L5-S1 TFESI  03/26/2022-right hip injection  03/28/2023-right hip injection  Past Surgery: no spinal surgeries  I have utilized the care everywhere function in epic to review the outside records available from external health systems.  Review of Systems:  A 10 point review of systems is negative, except for the pertinent positives and negatives detailed in the HPI.  Past Medical History: Past Medical History:  Diagnosis Date   Abdominal pain 06/23/2018   Acute breast pain 01/30/2022   Acute midline low back pain without sciatica 09/10/2019   Acute right flank pain 06/29/2021   Adverse effect of drug 08/15/2019   Anemia    Bronchitis    2 years ago    [facility] arthritis 2019   base of left thumb    Constipation 04/07/2019   Discoloration of skin 05/17/2021   Diverticulitis of colon    GERD (gastroesophageal reflux disease)    diet controlled , no meds   Hydrosalpinx    bilateral    Hyperlipidemia    recent dx -  currently diet control - no meds   Hypertension    Hypothyroid 2003   radio active iodine - no meds   Left-sided weakness 02/12/2019   Ovarian cyst, left 02/23/2016   Urinary frequency 11/30/2018   Vitamin B 12 deficiency     Past Surgical History: Past Surgical History:  Procedure Laterality Date   CESAREAN SECTION     x 2   CHOLECYSTECTOMY  2008   COLONOSCOPY WITH PROPOFOL  N/A 08/06/2021   Procedure: COLONOSCOPY WITH PROPOFOL ;  Surgeon: Jinny Carmine, MD;  Location: [facility];  Service: Endoscopy;  Laterality: N/A;   DILATION AND CURETTAGE OF UTERUS     X 2   ESOPHAGOGASTRODUODENOSCOPY (EGD) WITH PROPOFOL  N/A 08/06/2021   Procedure: ESOPHAGOGASTRODUODENOSCOPY (EGD) WITH PROPOFOL ;  Surgeon: Jinny Carmine, MD;  Location: [facility];  Service: Endoscopy;  Laterality: N/A;   LAPAROSCOPIC BILATERAL SALPINGECTOMY Bilateral 07/29/2017   Procedure: LAPAROSCOPIC LEFT SALPINGOOPHRECTOMY: Partial right Salpingectomy;  Surgeon: Corene Coy, MD;  Location: [facility];  Service: Gynecology;  Laterality: Bilateral;   POLYPECTOMY  08/06/2021   Procedure: POLYPECTOMY;  Surgeon: Jinny Carmine, MD;  Location: [facility];  Service: Endoscopy;;   SHOULDER SURGERY  2016   arthroscopic   TOTAL ABDOMINAL HYSTERECTOMY  2006   Fibroids, benign pathology   TUBAL LIGATION     WISDOM TOOTH EXTRACTION     at age 47 yr   WRIST ARTHROSCOPY Right 2007    Allergies: Allergies as of 09/26/2023 - Review Complete 09/26/2023  Allergen Reaction Noted   Aczone [dapsone] Swelling 07/24/2021   Levaquin  [levofloxacin ]  08/12/2019    Medications:  Current Outpatient Medications:    aspirin  81 MG EC tablet, Take by mouth., Disp: , Rfl:    atorvastatin  (LIPITOR) 10 MG tablet, Take 1 tablet (10 mg total) by mouth daily. for cholesterol., Disp: 90 tablet, Rfl: 0   azelastine  (ASTELIN ) 0.1 % nasal spray, Place into the nose., Disp: , Rfl:    clindamycin (CLEOCIN  T) 1 % external solution, SMARTSIG:Topical, Disp: , Rfl:    fluticasone  (FLONASE ) 50 MCG/ACT nasal spray, Place 1 spray into both nostrils 2 (two) times daily., Disp: 16 g, Rfl: 2   gabapentin (NEURONTIN) 600 MG tablet, Take 1,200 mg by mouth daily., Disp: , Rfl:    hydrochlorothiazide  (HYDRODIURIL ) 25 MG tablet, Take 1 tablet (25 mg total) by mouth daily. for blood pressure., Disp: 90 tablet, Rfl: 0   hydrocortisone 2.5 % cream, SMARTSIG:Topical, Disp: , Rfl:    ketoconazole (NIZORAL) 2 % cream, SMARTSIG:1 Application Topical 1 to 2 Times Daily, Disp: , Rfl:    pantoprazole  (PROTONIX ) 40 MG tablet, Take 1 tablet (40 mg total) by mouth 2 (two) times daily. for heartburn., Disp: 180 tablet, Rfl: 0   valACYclovir  (VALTREX ) 500 MG tablet, Take 1 tablet (500 mg total) by mouth daily. For suppression.  Take two tablets for 5 days in the event of an outbreak., Disp: 90 tablet, Rfl: 4  Social History: Social History   Tobacco Use   Smoking status: Never   Smokeless tobacco: Never  Vaping Use   Vaping status: Never Used  Substance Use Topics   Alcohol use: No   Drug use: No    Family Medical History: Family History  Problem Relation Age of Onset   Hypertension Mother    Ovarian cancer Mother    Other Father        unknown medical history   Hypertension Maternal Grandmother    Heart failure Maternal Grandmother    Lung cancer Maternal Grandmother    Breast cancer Maternal Aunt    Colon cancer Neg Hx    Colon polyps Neg Hx    Rectal cancer Neg Hx    Stomach cancer Neg Hx     Physical Examination: Vitals:   09/26/23 1035  BP: (!) 138/92    Strength:  Side Iliopsoas Quads Hamstring PF DF EHL  R 5 5 5 5 5 5   L 5 5 5 5 5  4+    Trace medial hamstring right 1 on left, 2+ at knees     No evidence of dysmetria noted.  Gait is normal.    Imaging: Narrative & Impression  CLINICAL DATA:  Lumbar radiculopathy bilaterally   EXAM: MRI LUMBAR SPINE WITHOUT CONTRAST    TECHNIQUE: Multiplanar, multisequence MR imaging of the lumbar spine was performed. No intravenous contrast was administered.   COMPARISON:  04/17/2022   FINDINGS: Segmentation:  Standard.   Alignment:  Stable.  No significant listhesis.   Vertebrae:  Stable vertebral body heights.  No new marrow edema.   Conus medullaris and cauda equina: Conus extends to the L1-L2 level. Conus and cauda equina appear normal.   Paraspinal and other soft tissues: Unremarkable.   Disc levels:   L1-L2:  Trace central disc protrusion.  No stenosis.   L2-L3:  No stenosis.   L3-L4:  Minor facet arthropathy.  No stenosis.   L4-L5: Disc bulge. Facet arthropathy with persistent joint effusions. No canal stenosis. Partial effacement  of the subarticular recesses. Mild foraminal stenosis.   L5-S1:  No stenosis.   IMPRESSION: Degenerative changes primarily at L4-L5 similar to the prior study. No new or progressive stenosis.     Electronically Signed   By: Santina Blanch M.D.   On: 07/17/2023 08:59   I have personally reviewed the images and agree with the above interpretation.  Medical Decision Making/Assessment and Plan: Ms. Ashley [name] is a pleasant 58 y.o. female with signs and symptoms consistent with lumbar radiculopathy and claudication.  She has a history of an L4-5 spondylosis who she has been treated with multiple rounds of injections and physical therapy.  Her nerve conduction study did not demonstrate any active radiculopathy.  She is here today to discuss.  We discussed plans for either decompressive operation to help with her radicular pain or more conservative therapy.  At this point she would like to continue with conservative therapy and physical therapy.  She would not like to go to chiropractor.  I will plan to continue to follow with her.  She does have a spondylolisthesis, we discussed that these can sometimes continue to worsen.  If she shows evidence of worsening instability at  that we will likely be offering a spinal fusion type procedure in the future.  Will continue to follow her closely   Penne MICAEL Sharps MD/MSCR Neurosurgery

## 2023-09-29 ENCOUNTER — Other Ambulatory Visit (INDEPENDENT_AMBULATORY_CARE_PROVIDER_SITE_OTHER): Payer: Self-pay | Admitting: Neurosurgery

## 2023-09-29 DIAGNOSIS — M4316 Spondylolisthesis, lumbar region: Secondary | ICD-10-CM

## 2023-09-29 DIAGNOSIS — G8929 Other chronic pain: Secondary | ICD-10-CM

## 2023-09-29 DIAGNOSIS — M5441 Lumbago with sciatica, right side: Secondary | ICD-10-CM

## 2023-09-29 DIAGNOSIS — M5442 Lumbago with sciatica, left side: Secondary | ICD-10-CM

## 2023-09-29 NOTE — Progress Notes (Signed)
 Patient reached out to us  requesting a updated physical therapy referral.  This has been placed in her chart.

## 2023-09-30 DIAGNOSIS — M4316 Spondylolisthesis, lumbar region: Secondary | ICD-10-CM | POA: Insufficient documentation

## 2023-10-03 ENCOUNTER — Other Ambulatory Visit: Payer: Self-pay | Admitting: Internal Medicine

## 2023-10-03 DIAGNOSIS — Z9049 Acquired absence of other specified parts of digestive tract: Secondary | ICD-10-CM

## 2023-10-03 DIAGNOSIS — R1011 Right upper quadrant pain: Secondary | ICD-10-CM

## 2023-10-07 ENCOUNTER — Ambulatory Visit: Admitting: Primary Care

## 2023-10-07 ENCOUNTER — Encounter: Payer: Self-pay | Admitting: Primary Care

## 2023-10-07 VITALS — BP 112/60 | HR 62 | Temp 97.9°F | Ht 66.0 in | Wt 171.0 lb

## 2023-10-07 DIAGNOSIS — G8929 Other chronic pain: Secondary | ICD-10-CM

## 2023-10-07 DIAGNOSIS — I10 Essential (primary) hypertension: Secondary | ICD-10-CM | POA: Diagnosis not present

## 2023-10-07 DIAGNOSIS — E785 Hyperlipidemia, unspecified: Secondary | ICD-10-CM

## 2023-10-07 DIAGNOSIS — M5441 Lumbago with sciatica, right side: Secondary | ICD-10-CM

## 2023-10-07 DIAGNOSIS — R7303 Prediabetes: Secondary | ICD-10-CM | POA: Diagnosis not present

## 2023-10-07 DIAGNOSIS — M79643 Pain in unspecified hand: Secondary | ICD-10-CM | POA: Diagnosis not present

## 2023-10-07 NOTE — Assessment & Plan Note (Signed)
 Ongoing.  Follow with rheumatology as scheduled.  Discussed Voltaren Gel PRN.

## 2023-10-07 NOTE — Assessment & Plan Note (Signed)
 Ongoing.  Reviewed neurosurgery office notes from June 2025. Continue with PT as scheduled.   Continue gabapentin 1200 mg daily.

## 2023-10-07 NOTE — Progress Notes (Signed)
 Subjective:    Patient ID: Ashley [name], female    DOB: [birth year], 58 y.o.   MRN: [id number]  HPI  Ashley [name] is a very pleasant 58 y.o. female with a history of hypertension, GERD, chronic pain syndrome, osteoarthritis of multiple joints, chronic low back pain, chronic hand pain, chronic shoulder pain, chronic knee pain, chronic hip pain who presents today to discuss several concerns.  1) Chronic Pain: Chronic to the entire body, worse more recently to the right dorsal hand and fingers with tightness to the joint. Also with pinpoint pain to multiple regions of her body.   She has an appointment with rheumatology scheduled for August 21st for positive ANA and ongoing joint pain. Also following with neurosurgery, latest xray with bilateral L5-S1 TFESI. The plan is to resume another round of PT, her appointment is next week.   She's been taking Ibuprofen , gabapentin, joint pain cream with minimal improvement. She will take gabapentin PRN only.   2) Hypertension: Currently managed on hydrochlorothiazide  25 mg daily. We reduced her hydrochlorothiazide  dose to 12.5 mg daily due to dizziness. She now denies dizziness, headaches.   BP Readings from Last 3 Encounters:  10/07/23 112/60  09/26/23 (!) 138/92  09/15/23 (!) 158/90   3) Hyperlipidemia: Not currently managed on statin therapy due to chronic joint pain. She is prescribed atorvastatin  10 daily. Last LDL of 183 in January 2025.    Review of Systems  Respiratory:  Negative for shortness of breath.   Cardiovascular:  Negative for chest pain.  Neurological:  Negative for dizziness.         Past Medical History:  Diagnosis Date   Abdominal pain 06/23/2018   Acute breast pain 01/30/2022   Acute midline low back pain without sciatica 09/10/2019   Acute right flank pain 06/29/2021   Adverse effect of drug 08/15/2019   Anemia    Bronchitis    2 years ago    [facility] arthritis 2019   base of left thumb     Constipation 04/07/2019   Discoloration of skin 05/17/2021   Diverticulitis of colon    GERD (gastroesophageal reflux disease)    diet controlled , no meds   Hydrosalpinx    bilateral    Hyperlipidemia    recent dx - currently diet control - no meds   Hypertension    Hypothyroid 2003   radio active iodine - no meds   Left-sided weakness 02/12/2019   Ovarian cyst, left 02/23/2016   Urinary frequency 11/30/2018   Vitamin B 12 deficiency     Social History   Socioeconomic History   Marital status: Single    Spouse name: Not on file   Number of children: Not on file   Years of education: Not on file   Highest education level: Not on file  Occupational History   Not on file  Tobacco Use   Smoking status: Never   Smokeless tobacco: Never  Vaping Use   Vaping status: Never Used  Substance and Sexual Activity   Alcohol use: No   Drug use: No   Sexual activity: Yes    Partners: Male    Birth control/protection: Surgical    Comment: hysterectomy.  Other Topics Concern   Not on file  Social History Narrative   Single.   3 children.   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   Enjoys relaxing, exercise, walking her dog.    Social Drivers of Corporate investment banker Strain: Not  on file  Food Insecurity: Not on file  Transportation Needs: Not on file  Physical Activity: Not on file  Stress: Not on file  Social Connections: Unknown (11/21/2022)   Received from [facility]   Social Network    Social Network: Not on file  Intimate Partner Violence: Unknown (11/21/2022)   Received from [facility]   HITS    Physically Hurt: Not on file    Insult or Talk Down To: Not on file    Threaten Physical Harm: Not on file    Scream or Curse: Not on file    Past Surgical History:  Procedure Laterality Date   CESAREAN SECTION     x 2   CHOLECYSTECTOMY  2008   COLONOSCOPY WITH PROPOFOL  N/A 08/06/2021   Procedure: COLONOSCOPY WITH PROPOFOL ;  Surgeon: Jinny Carmine, MD;  Location:  [facility];  Service: Endoscopy;  Laterality: N/A;   DILATION AND CURETTAGE OF UTERUS     X 2   ESOPHAGOGASTRODUODENOSCOPY (EGD) WITH PROPOFOL  N/A 08/06/2021   Procedure: ESOPHAGOGASTRODUODENOSCOPY (EGD) WITH PROPOFOL ;  Surgeon: Jinny Carmine, MD;  Location: [facility];  Service: Endoscopy;  Laterality: N/A;   LAPAROSCOPIC BILATERAL SALPINGECTOMY Bilateral 07/29/2017   Procedure: LAPAROSCOPIC LEFT SALPINGOOPHRECTOMY: Partial right Salpingectomy;  Surgeon: Corene Coy, MD;  Location: [facility];  Service: Gynecology;  Laterality: Bilateral;   POLYPECTOMY  08/06/2021   Procedure: POLYPECTOMY;  Surgeon: Jinny Carmine, MD;  Location: [facility];  Service: Endoscopy;;   SHOULDER SURGERY  2016   arthroscopic   TOTAL ABDOMINAL HYSTERECTOMY  2006   Fibroids, benign pathology   TUBAL LIGATION     WISDOM TOOTH EXTRACTION     at age 24 yr   WRIST ARTHROSCOPY Right 2007    Family History  Problem Relation Age of Onset   Hypertension Mother    Ovarian cancer Mother    Other Father        unknown medical history   Hypertension Maternal Grandmother    Heart failure Maternal Grandmother    Lung cancer Maternal Grandmother    Breast cancer Maternal Aunt    Colon cancer Neg Hx    Colon polyps Neg Hx    Rectal cancer Neg Hx    Stomach cancer Neg Hx     Allergies  Allergen Reactions   Aczone [Dapsone] Swelling   Levaquin  [Levofloxacin ]     Presumed cause of joint aches    Current Outpatient Medications on File Prior to Visit  Medication Sig Dispense Refill   aspirin  81 MG EC tablet Take by mouth.     atorvastatin  (LIPITOR) 10 MG tablet Take 1 tablet (10 mg total) by mouth daily. for cholesterol. 90 tablet 0   azelastine  (ASTELIN ) 0.1 % nasal spray Place into the nose.     clindamycin (CLEOCIN T) 1 % external solution SMARTSIG:Topical     fluticasone  (FLONASE ) 50 MCG/ACT nasal spray Place 1 spray into both nostrils 2 (two) times daily.  16 g 2   gabapentin (NEURONTIN) 600 MG tablet Take 1,200 mg by mouth daily.     hydrochlorothiazide  (HYDRODIURIL ) 25 MG tablet Take 1 tablet (25 mg total) by mouth daily. for blood pressure. 90 tablet 0   hydrocortisone 2.5 % cream SMARTSIG:Topical     ketoconazole (NIZORAL) 2 % cream SMARTSIG:1 Application Topical 1 to 2 Times Daily     pantoprazole  (PROTONIX ) 40 MG tablet Take 1 tablet (40 mg total) by mouth 2 (two) times daily. for heartburn. 180 tablet 0  valACYclovir  (VALTREX ) 500 MG tablet Take 1 tablet (500 mg total) by mouth daily. For suppression.  Take two tablets for 5 days in the event of an outbreak. 90 tablet 4   No current facility-administered medications on file prior to visit.    BP 112/60   Pulse 62   Temp 97.9 F (36.6 C) (Temporal)   Ht 5' 6 (1.676 m)   Wt 171 lb (77.6 kg)   SpO2 100%   BMI 27.60 kg/m  Objective:   Physical Exam Cardiovascular:     Rate and Rhythm: Normal rate and regular rhythm.  Pulmonary:     Effort: Pulmonary effort is normal.     Breath sounds: Normal breath sounds.  Musculoskeletal:     Cervical back: Neck supple.  Skin:    General: Skin is warm and dry.  Neurological:     Mental Status: She is alert and oriented to person, place, and time.  Psychiatric:        Mood and Affect: Mood normal.           Assessment & Plan:  Essential hypertension Assessment & Plan: Controlled!  Continue hydrochlorothiazide  12.5 mg daily. She will continue to cut her 25 mg tablets in half.    Prediabetes -     Hemoglobin A1c; Future  Hyperlipidemia, unspecified hyperlipidemia type Assessment & Plan: Above goal in January 2025, inconsistent use of atorvastatin .  Resume atorvastatin  10 mg daily.  Repeat lipids in September 2025.  Orders: -     Lipid panel; Future  Chronic hand pain, unspecified laterality Assessment & Plan: Ongoing.  Follow with rheumatology as scheduled.  Discussed Voltaren Gel PRN.   Chronic bilateral low  back pain with right-sided sciatica Assessment & Plan: Ongoing.  Reviewed neurosurgery office notes from June 2025. Continue with PT as scheduled.   Continue gabapentin 1200 mg daily.          Envi Eagleson K Kalifa Cadden, NP

## 2023-10-07 NOTE — Patient Instructions (Signed)
 Schedule a lab only appointment to repeat your cholesterol and diabetes test.   Start taking gabapentin 1-2 pills daily for joint and nerve pain.   Try using Voltaren Gel to your joints.   Follow up with rheumatology and physical therapy.   It was a pleasure to see you today!

## 2023-10-07 NOTE — Assessment & Plan Note (Signed)
 Controlled!  Continue hydrochlorothiazide  12.5 mg daily. She will continue to cut her 25 mg tablets in half.

## 2023-10-07 NOTE — Assessment & Plan Note (Signed)
 Above goal in January 2025, inconsistent use of atorvastatin .  Resume atorvastatin  10 mg daily.  Repeat lipids in September 2025.

## 2023-10-09 ENCOUNTER — Ambulatory Visit
Admission: RE | Admit: 2023-10-09 | Discharge: 2023-10-09 | Disposition: A | Discharge status: Home / Self Care | Source: Ambulatory Visit | Attending: Internal Medicine | Admitting: Internal Medicine

## 2023-10-09 DIAGNOSIS — Z9049 Acquired absence of other specified parts of digestive tract: Secondary | ICD-10-CM | POA: Diagnosis present

## 2023-10-09 DIAGNOSIS — R1011 Right upper quadrant pain: Secondary | ICD-10-CM | POA: Insufficient documentation

## 2023-10-22 ENCOUNTER — Encounter: Payer: Self-pay | Admitting: Emergency Medicine

## 2023-10-22 ENCOUNTER — Ambulatory Visit
Admission: EM | Admit: 2023-10-22 | Discharge: 2023-10-22 | Disposition: A | Discharge status: Home / Self Care | Attending: Family Medicine | Admitting: Family Medicine

## 2023-10-22 DIAGNOSIS — U071 COVID-19: Secondary | ICD-10-CM | POA: Diagnosis not present

## 2023-10-22 DIAGNOSIS — I10 Essential (primary) hypertension: Secondary | ICD-10-CM | POA: Insufficient documentation

## 2023-10-22 DIAGNOSIS — K219 Gastro-esophageal reflux disease without esophagitis: Secondary | ICD-10-CM | POA: Insufficient documentation

## 2023-10-22 LAB — RESP PANEL BY RT-PCR (FLU A&B, COVID) ARPGX2
Influenza A by PCR: NEGATIVE
Influenza B by PCR: NEGATIVE
SARS Coronavirus 2 by RT PCR: POSITIVE — AB

## 2023-10-22 MED ORDER — FAMOTIDINE 20 MG PO TABS: 20.0000 mg | ORAL_TABLET | Freq: Two times a day (BID) | ORAL | 0 refills | Status: DC

## 2023-10-22 MED ORDER — PROMETHAZINE-DM 6.25-15 MG/5ML PO SYRP: 5.0000 mL | ORAL_SOLUTION | Freq: Four times a day (QID) | ORAL | 0 refills | Status: DC | PRN

## 2023-10-22 MED ORDER — IBUPROFEN 800 MG PO TABS
800.0000 mg | ORAL_TABLET | Freq: Once | ORAL | Status: AC
  Administered 2023-10-22: 800 mg via ORAL

## 2023-10-22 NOTE — ED Provider Notes (Addendum)
 [facility]    CSN: [id number] Arrival date & time: 10/22/23  1749      History   Chief Complaint Chief Complaint  Patient presents with   Nasal Congestion   Sore Throat   Chills   Cough   Generalized Body Aches    HPI Ashley [name] is a 58 y.o. female.   HPI  History obtained from the patient. Ashley [name] presents for rhinorrhea, sore throat, body aches, chills, cough that started today.  Sore throat has improved.  No known fever. Denies vomiting or diarrhea. She went out to the store yesterday and thinks she picked up something in the store.  No medications prior to arrival. No history of asthma. Denies smoking and vaping.   Says the post-nasal drip is aggravating her acid reflux. Has acid reflux.        Past Medical History:  Diagnosis Date   Abdominal pain 06/23/2018   Acute breast pain 01/30/2022   Acute midline low back pain without sciatica 09/10/2019   Acute right flank pain 06/29/2021   Adverse effect of drug 08/15/2019   Anemia    Bronchitis    2 years ago    [facility] arthritis 2019   base of left thumb    Constipation 04/07/2019   Discoloration of skin 05/17/2021   Diverticulitis of colon    GERD (gastroesophageal reflux disease)    diet controlled , no meds   Hydrosalpinx    bilateral    Hyperlipidemia    recent dx - currently diet control - no meds   Hypertension    Hypothyroid 2003   radio active iodine - no meds   Left-sided weakness 02/12/2019   Ovarian cyst, left 02/23/2016   Urinary frequency 11/30/2018   Vitamin B 12 deficiency     Patient Active Problem List   Diagnosis Date Noted   Spondylolisthesis at L4-L5 level 09/30/2023   Positive ANA (antinuclear antibody) 08/06/2023   Scalp itch 08/01/2023   Facial pain 08/01/2023   Chronic hip pain, right 05/30/2023   Upper extremity pain, anterior, left 12/12/2022   Screening examination for STI 11/07/2022   Frequent headaches 06/11/2022   Chronic pain of right knee  01/30/2022   TMJ (temporomandibular joint syndrome) 09/07/2021   Polyp of sigmoid colon    Left lower quadrant abdominal pain 06/29/2021   History of diverticulitis 06/29/2021   Adjustment reaction with anxiety and depression 05/09/2021   Osteoarthritis of carpometacarpal (CMC) joint of thumb 04/09/2021   Pain of right thumb 04/09/2021   Persistent cough for 3 weeks or longer 10/04/2020   Palpitations 10/04/2020   Prediabetes 06/15/2020   Chronic hand pain 05/10/2020   Neuropathy 12/13/2019   Lumbar stenosis without neurogenic claudication 12/09/2019   Cervical radiculitis 11/19/2019   Foraminal stenosis of cervical region 11/19/2019   Dizziness 10/07/2019   Chronic bilateral low back pain 09/10/2019   HSV (herpes simplex virus) anogenital infection 06/25/2019   Encounter for annual general medical examination with abnormal findings in adult 06/23/2019   Chronic neck pain 02/17/2019   Essential hypertension    History of TIA (transient ischemic attack)    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   Numbness and tingling 02/01/2019   Urinary frequency 11/30/2018   Ganglion cyst of tendon sheath of left hand 11/13/2018   Acquired trigger finger 10/26/2018   Osteoarthritis 06/30/2018   Vitamin D  deficiency 06/23/2018   Vitamin B 12 deficiency 06/23/2018   Generalized abdominal pain 06/23/2018   GERD (gastroesophageal reflux  disease) 01/30/2018   Hyperlipidemia 03/19/2017   Allergic rhinitis 03/19/2017   Injury of right shoulder 04/18/2015   Impingement syndrome of left shoulder 12/27/2014   Impingement syndrome of right shoulder 12/27/2014   Chronic right shoulder pain 11/29/2014    Past Surgical History:  Procedure Laterality Date   CESAREAN SECTION     x 2   CHOLECYSTECTOMY  2008   COLONOSCOPY WITH PROPOFOL  N/A 08/06/2021   Procedure: COLONOSCOPY WITH PROPOFOL ;  Surgeon: Jinny Carmine, MD;  Location: [facility];  Service: Endoscopy;  Laterality: N/A;   DILATION AND  CURETTAGE OF UTERUS     X 2   ESOPHAGOGASTRODUODENOSCOPY (EGD) WITH PROPOFOL  N/A 08/06/2021   Procedure: ESOPHAGOGASTRODUODENOSCOPY (EGD) WITH PROPOFOL ;  Surgeon: Jinny Carmine, MD;  Location: [facility];  Service: Endoscopy;  Laterality: N/A;   LAPAROSCOPIC BILATERAL SALPINGECTOMY Bilateral 07/29/2017   Procedure: LAPAROSCOPIC LEFT SALPINGOOPHRECTOMY: Partial right Salpingectomy;  Surgeon: Corene Coy, MD;  Location: [facility];  Service: Gynecology;  Laterality: Bilateral;   POLYPECTOMY  08/06/2021   Procedure: POLYPECTOMY;  Surgeon: Jinny Carmine, MD;  Location: [facility];  Service: Endoscopy;;   SHOULDER SURGERY  2016   arthroscopic   TOTAL ABDOMINAL HYSTERECTOMY  2006   Fibroids, benign pathology   TUBAL LIGATION     WISDOM TOOTH EXTRACTION     at age 68 yr   WRIST ARTHROSCOPY Right 2007    OB History     Gravida  5   Para  3   Term  3   Preterm  0   AB  2   Living  3      SAB  2   IAB  0   Ectopic  0   Multiple  0   Live Births  3            Home Medications    Prior to Admission medications   Medication Sig Start Date End Date Taking? Authorizing Provider  famotidine  (PEPCID ) 20 MG tablet Take 1 tablet (20 mg total) by mouth 2 (two) times daily. 10/22/23  Yes Doron Shake, DO  promethazine -dextromethorphan (PROMETHAZINE -DM) 6.25-15 MG/5ML syrup Take 5 mLs by mouth 4 (four) times daily as needed. 10/22/23  Yes Jorgen Wolfinger, DO  tiZANidine  (ZANAFLEX ) 2 MG tablet Take between 1mg  (Half a tablet) and 4mg  (two tablets) up to three times daily as needed for musculoskeletal pain or muscle spasms. 10/16/23  Yes [provider]  aspirin  81 MG EC tablet Take by mouth. 09/16/19   [provider]  atorvastatin  (LIPITOR) 10 MG tablet Take 1 tablet (10 mg total) by mouth daily. for cholesterol. 01/08/23   Clark, Katherine K, NP  azelastine  (ASTELIN ) 0.1 % nasal spray Place into the nose. 03/11/19    [provider]  clindamycin (CLEOCIN T) 1 % external solution SMARTSIG:Topical 09/11/23   [provider]  fluticasone  (FLONASE ) 50 MCG/ACT nasal spray Place 1 spray into both nostrils 2 (two) times daily. 11/19/19   Tower, Laine LABOR, MD  gabapentin (NEURONTIN) 600 MG tablet Take 1,200 mg by mouth daily. 01/25/20   [provider]  hydrochlorothiazide  (HYDRODIURIL ) 25 MG tablet Take 1 tablet (25 mg total) by mouth daily. for blood pressure. 08/20/23   Clark, Katherine K, NP  hydrocortisone 2.5 % cream SMARTSIG:Topical 09/15/23   [provider]  ketoconazole (NIZORAL) 2 % cream SMARTSIG:1 Application Topical 1 to 2 Times Daily 09/15/23   [provider]  pantoprazole  (PROTONIX ) 40 MG tablet Take 1 tablet (  40 mg total) by mouth 2 (two) times daily. for heartburn. 06/20/23 06/19/24  Clark, Katherine K, NP  valACYclovir  (VALTREX ) 500 MG tablet Take 1 tablet (500 mg total) by mouth daily. For suppression.  Take two tablets for 5 days in the event of an outbreak. 03/06/23   Herchel Gloris LABOR, MD    Family History Family History  Problem Relation Age of Onset   Hypertension Mother    Ovarian cancer Mother    Other Father        unknown medical history   Hypertension Maternal Grandmother    Heart failure Maternal Grandmother    Lung cancer Maternal Grandmother    Breast cancer Maternal Aunt    Colon cancer Neg Hx    Colon polyps Neg Hx    Rectal cancer Neg Hx    Stomach cancer Neg Hx     Social History Social History   Tobacco Use   Smoking status: Never   Smokeless tobacco: Never  Vaping Use   Vaping status: Never Used  Substance Use Topics   Alcohol use: No   Drug use: No     Allergies   Aczone [dapsone] and Levaquin  [levofloxacin ]   Review of Systems Review of Systems: negative unless otherwise stated in HPI.      Physical Exam Triage Vital Signs ED Triage Vitals [10/22/23 1758]  Encounter Vitals Group     BP      Girls Systolic  BP Percentile      Girls Diastolic BP Percentile      Boys Systolic BP Percentile      Boys Diastolic BP Percentile      Pulse      Resp      Temp      Temp src      SpO2      Weight 174 lb (78.9 kg)     Height      Head Circumference      Peak Flow      Pain Score      Pain Loc      Pain Education      Exclude from Growth Chart    No data found.  Updated Vital Signs BP (!) 164/93 (BP Location: Right Arm)   Pulse 95   Temp (!) 101.2 F (38.4 C) (Oral)   Resp 16   Wt 78.9 kg   SpO2 97%   BMI 28.08 kg/m   Visual Acuity Right Eye Distance:   Left Eye Distance:   Bilateral Distance:    Right Eye Near:   Left Eye Near:    Bilateral Near:     Physical Exam GEN:     alert, non-toxic appearing female in no distress    HENT:  mucus membranes moist, oropharyngeal without lesions, mild  erythema, no tonsillar hypertrophy or exudates,  clear nasal discharge EYES:   pupils equal and reactive, no scleral injection or discharge NECK:  normal ROM, +lymphadenopathy, no meningismus   RESP:  no increased work of breathing, clear to auscultation bilaterally CVS:   regular rhythm, tachycardic  Skin:   warm and dry, no rash on visible skin    UC Treatments / Results  Labs (all labs ordered are listed, but only abnormal results are displayed) Labs Reviewed  RESP PANEL BY RT-PCR (FLU A&B, COVID) ARPGX2 - Abnormal; Notable for the following components:      Result Value   SARS Coronavirus 2 by RT PCR POSITIVE (*)    All other  components within normal limits    EKG   Radiology No results found.   Procedures Procedures (including critical care time)  Medications Ordered in UC Medications  ibuprofen  (ADVIL ) tablet 800 mg (800 mg Oral Given 10/22/23 1808)    Initial Impression / Assessment and Plan / UC Course  I have reviewed the triage vital signs and the nursing notes.  Pertinent labs & imaging results that were available during my care of the patient were reviewed  by me and considered in my medical decision making (see chart for details).       Pt is a 58 y.o. female who presents for 1 day of respiratory symptoms. Aloni is febrile here. Given Motrin  8000 mg.   Satting well on room air.  She is hypertensive here at 181/98. After sitting, BP decreased to 168/93.    Overall pt is ill but non-toxic appearing, well hydrated, without respiratory distress. Pulmonary exam is unremarkable.  COVID and influenza panel obtained and was COVID positive.  Promethazine  DM for cough.  Discussed symptomatic treatment.  Explained lack of efficacy of antibiotics in viral disease.  Typical duration of symptoms discussed.   Has history of GERD.  She is having acute flareup therefore we will add short course of Pepcid .  Return and ED precautions given and voiced understanding. Discussed MDM, treatment plan and plan for follow-up with patient who agrees with plan.     Final Clinical Impressions(s) / UC Diagnoses   Final diagnoses:  COVID-19  Gastroesophageal reflux disease, unspecified whether esophagitis present  Elevated blood pressure reading with diagnosis of hypertension     Discharge Instructions      Your influenza test is negative however your test for COVID-19 was positive, meaning that you were infected with the novel coronavirus and could give the germ to others.  The recommendations suggest returning to normal activities when, for at least 24 hours, symptoms are improving overall, and if a fever was present, it has been gone without use of a fever-reducing medication.  You should wear a mask for the next 5 days to prevent the spread of disease. Please continue good preventive care measures, including:  frequent hand-washing, avoid touching your face, cover coughs/sneezes, stay out of crowds and keep a 6 foot distance from others.  Go to the nearest hospital emergency room if fever/cough/breathlessness are severe or illness seems like a threat to life.  If  your were prescribed medication. Stop by the pharmacy to pick it up. You can take Tylenol  and/or Ibuprofen  as needed for fever reduction and pain relief.    For cough: honey 1/2 to 1 teaspoon (you can dilute the honey in water  or another fluid).  You can also use guaifenesin and dextromethorphan for cough. You can use a humidifier for chest congestion and cough.  If you don't have a humidifier, you can sit in the bathroom with the hot shower running.      For sore throat: try warm salt water  gargles, Mucinex sore throat cough drops or cepacol lozenges, throat spray, warm tea or water  with lemon/honey, popsicles or ice, or OTC cold relief medicine for throat discomfort. You can also purchase chloraseptic spray at the pharmacy or dollar store.   For congestion: take a daily anti-histamine like Zyrtec , Claritin , and a oral decongestant, such as pseudoephedrine.  You can also use Flonase  1-2 sprays in each nostril daily. Afrin is also a good option, if you do not have high blood pressure.    It is important  to stay hydrated: drink plenty of fluids (water , gatorade/powerade/pedialyte, juices, or teas) to keep your throat moisturized and help further relieve irritation/discomfort.    Return or go to the Emergency Department if symptoms worsen or do not improve in the next few days      ED Prescriptions     Medication Sig Dispense Auth. Provider   promethazine -dextromethorphan (PROMETHAZINE -DM) 6.25-15 MG/5ML syrup Take 5 mLs by mouth 4 (four) times daily as needed. 118 mL Aparna Vanderweele, DO   famotidine  (PEPCID ) 20 MG tablet Take 1 tablet (20 mg total) by mouth 2 (two) times daily. 30 tablet Therin Vetsch, DO      PDMP not reviewed this encounter.       Sharia Averitt, DO 10/22/23 1915

## 2023-10-22 NOTE — ED Triage Notes (Signed)
 Pt presents with a runny nose, sore throat yesterday, chills bodyaches and chest congestion since yesterday. Pt has not taken anything for the pain.

## 2023-10-22 NOTE — Discharge Instructions (Signed)
 Your influenza test is negative however your test for COVID-19 was positive, meaning that you were infected with the novel coronavirus and could give the germ to others.  The recommendations suggest returning to normal activities when, for at least 24 hours, symptoms are improving overall, and if a fever was present, it has been gone without use of a fever-reducing medication.  You should wear a mask for the next 5 days to prevent the spread of disease. Please continue good preventive care measures, including:  frequent hand-washing, avoid touching your face, cover coughs/sneezes, stay out of crowds and keep a 6 foot distance from others.  Go to the nearest hospital emergency room if fever/cough/breathlessness are severe or illness seems like a threat to life.  If your were prescribed medication. Stop by the pharmacy to pick it up. You can take Tylenol  and/or Ibuprofen  as needed for fever reduction and pain relief.    For cough: honey 1/2 to 1 teaspoon (you can dilute the honey in water  or another fluid).  You can also use guaifenesin and dextromethorphan for cough. You can use a humidifier for chest congestion and cough.  If you don't have a humidifier, you can sit in the bathroom with the hot shower running.      For sore throat: try warm salt water  gargles, Mucinex sore throat cough drops or cepacol lozenges, throat spray, warm tea or water  with lemon/honey, popsicles or ice, or OTC cold relief medicine for throat discomfort. You can also purchase chloraseptic spray at the pharmacy or dollar store.   For congestion: take a daily anti-histamine like Zyrtec , Claritin , and a oral decongestant, such as pseudoephedrine.  You can also use Flonase  1-2 sprays in each nostril daily. Afrin is also a good option, if you do not have high blood pressure.    It is important to stay hydrated: drink plenty of fluids (water , gatorade/powerade/pedialyte, juices, or teas) to keep your throat moisturized and help further  relieve irritation/discomfort.    Return or go to the Emergency Department if symptoms worsen or do not improve in the next few days

## 2023-11-12 ENCOUNTER — Other Ambulatory Visit

## 2023-11-19 ENCOUNTER — Ambulatory Visit: Payer: Self-pay | Admitting: Primary Care

## 2023-11-19 ENCOUNTER — Encounter: Payer: Self-pay | Admitting: Primary Care

## 2023-11-19 ENCOUNTER — Ambulatory Visit: Admitting: Primary Care

## 2023-11-19 VITALS — BP 148/86 | HR 94 | Temp 97.8°F | Ht 66.0 in | Wt 168.0 lb

## 2023-11-19 DIAGNOSIS — K219 Gastro-esophageal reflux disease without esophagitis: Secondary | ICD-10-CM

## 2023-11-19 DIAGNOSIS — E785 Hyperlipidemia, unspecified: Secondary | ICD-10-CM

## 2023-11-19 DIAGNOSIS — R350 Frequency of micturition: Secondary | ICD-10-CM | POA: Diagnosis not present

## 2023-11-19 DIAGNOSIS — R058 Other specified cough: Secondary | ICD-10-CM | POA: Diagnosis not present

## 2023-11-19 DIAGNOSIS — R7303 Prediabetes: Secondary | ICD-10-CM | POA: Diagnosis not present

## 2023-11-19 DIAGNOSIS — R8290 Unspecified abnormal findings in urine: Secondary | ICD-10-CM

## 2023-11-19 LAB — POC URINALSYSI DIPSTICK (AUTOMATED)
Bilirubin, UA: NEGATIVE
Blood, UA: NEGATIVE
Glucose, UA: NEGATIVE
Ketones, UA: NEGATIVE
Leukocytes, UA: NEGATIVE
Nitrite, UA: NEGATIVE
Protein, UA: NEGATIVE
Spec Grav, UA: 1.01 (ref 1.010–1.025)
Urobilinogen, UA: 4 U/dL — AB
pH, UA: 6 (ref 5.0–8.0)

## 2023-11-19 LAB — LIPID PANEL
Cholesterol: 163 mg/dL (ref 0–200)
HDL: 66.4 mg/dL (ref >39.00)
LDL Cholesterol: 84 mg/dL (ref 0–99)
NonHDL: 96.73
Total CHOL/HDL Ratio: 2
Triglycerides: 65 mg/dL (ref 0.0–149.0)
VLDL: 13 mg/dL (ref 0.0–40.0)

## 2023-11-19 LAB — HEMOGLOBIN A1C: Hgb A1c MFr Bld: 6.1 % (ref 4.6–6.5)

## 2023-11-19 MED ORDER — CETIRIZINE HCL 10 MG PO TABS: 10.0000 mg | ORAL_TABLET | Freq: Every day | ORAL | 0 refills | Status: DC

## 2023-11-19 MED ORDER — ATORVASTATIN CALCIUM 10 MG PO TABS: 10.0000 mg | ORAL_TABLET | Freq: Every day | ORAL | 2 refills | Status: AC

## 2023-11-19 MED ORDER — PREDNISONE 20 MG PO TABS: ORAL_TABLET | ORAL | 0 refills | Status: DC

## 2023-11-19 MED ORDER — PANTOPRAZOLE SODIUM 40 MG PO TBEC: 40.0000 mg | DELAYED_RELEASE_TABLET | Freq: Two times a day (BID) | ORAL | 2 refills | Status: DC

## 2023-11-19 NOTE — Assessment & Plan Note (Signed)
 UA today negative.  Symptoms today could be secondary to increased water  intake, and sacroiliac joint pain.

## 2023-11-19 NOTE — Patient Instructions (Signed)
 Start cetirizine  (Zyrtec ) 10 mg tablets at bedtime for 5 drainage.  Start prednisone  20 mg tablets. Take 2 tablets by mouth once daily in the morning for 5 days.  Continue your pantoprazole .  It was a pleasure to see you today!

## 2023-11-19 NOTE — Assessment & Plan Note (Signed)
 Differentials include postnasal drip, uncontrolled GERD. No evidence of infectious cause today.  Start Zyrtec  10 mg at bedtime for postnasal drip. Start prednisone  20 mg tablets. Take 2 tablets by mouth once daily in the morning for 5 days for cough.  Continue pantoprazole  40 mg twice daily. Continue good water  intake.  Consider inhaled corticosteroid if needed.

## 2023-11-19 NOTE — Progress Notes (Signed)
 Subjective:    Patient ID: Ashley [name], female    DOB: 09-Jan-1966, 58 y.o.   MRN: [id number]  Ashley [name] is a very pleasant 58 y.o. female with a medical history of hypertension, GERD, neuropathy, osteoarthritis, chronic shoulder pain, chronic neck pain, chronic back pain, chronic history who presents today to discuss cough and UTI symptoms.  She is also due for repeat A1C and lipids.   Evaluated urgent care on 10/22/2023 for a several hour history of rhinorrhea, sore throat, body aches, chills.  She tested positive for COVID-19 infection and was provided with conservative care including meclizine  DM cough syrup and famotidine .  Since her symptoms began her body, aches, fevers have resolved. She's feeling back to normal except for her cough. She continues to notice post nasal drip with thick mucous and increased esophageal reflux. Her cough and post nasal drip are worse and night and when first waking during the morning.   She's tried Mucinex, hot tea, increased fluid intake.  She completed her course of famotidine  without improvement. She's also tried Mylanta with temporary improvement. She is managed on pantoprazole  40 mg BID and has been compliant. She is also using her Flonase  and Astelin  nasal sprays. Nothing seems to help with post nasal drip and heartburn.   The first time she had Covid-19 infection her cough lingered for a long time. She was prescribed prednisone  which helped.   She's also noticed increased lower back pain with urinary frequency, suprapubic pressure. She questions if she has a UTI. She denies hematuria, vaginal symptoms.   BP Readings from Last 3 Encounters:  11/19/23 (!) 148/86  10/22/23 (!) 164/93  10/07/23 112/60      Review of Systems  Constitutional:  Negative for chills and fever.  HENT:  Positive for postnasal drip. Negative for congestion, rhinorrhea and sore throat.   Respiratory:  Positive for cough. Negative for chest tightness  and shortness of breath.   Genitourinary:  Positive for frequency and pelvic pain. Negative for dysuria.  Musculoskeletal:  Positive for back pain.         Past Medical History:  Diagnosis Date   Abdominal pain 06/23/2018   Acute breast pain 01/30/2022   Acute midline low back pain without sciatica 09/10/2019   Acute right flank pain 06/29/2021   Adverse effect of drug 08/15/2019   Anemia    Bronchitis    2 years ago    [facility] arthritis 2019   base of left thumb    Constipation 04/07/2019   Discoloration of skin 05/17/2021   Diverticulitis of colon    GERD (gastroesophageal reflux disease)    diet controlled , no meds   Hydrosalpinx    bilateral    Hyperlipidemia    recent dx - currently diet control - no meds   Hypertension    Hypothyroid 2003   radio active iodine - no meds   Left-sided weakness 02/12/2019   Ovarian cyst, left 02/23/2016   Urinary frequency 11/30/2018   Vitamin B 12 deficiency     Social History   Socioeconomic History   Marital status: Single    Spouse name: Not on file   Number of children: Not on file   Years of education: Not on file   Highest education level: Not on file  Occupational History   Not on file  Tobacco Use   Smoking status: Never   Smokeless tobacco: Never  Vaping Use   Vaping status: Never Used  Substance and Sexual  Activity   Alcohol use: No   Drug use: No   Sexual activity: Yes    Partners: Male    Birth control/protection: Surgical    Comment: hysterectomy.  Other Topics Concern   Not on file  Social History Narrative   Single.   3 children.   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   Enjoys relaxing, exercise, walking her dog.    Social Drivers of Corporate investment banker Strain: Low Risk  (10/16/2023)   Received from [facility]   Overall Financial Resource Strain (CARDIA)    Difficulty of Paying Living Expenses: Not hard at all  Food Insecurity: No Food Insecurity (10/16/2023)   Received from [facility]   Hunger Vital Sign    Within the past 12 months, you worried that your food would run out before you got the money to buy more.: Never true    Within the past 12 months, the food you bought just didn't last and you didn't have money to get more.: Never true  Transportation Needs: No Transportation Needs (10/16/2023)   Received from [facility] - Transportation    In the past 12 months, has lack of transportation kept you from medical appointments or from getting medications?: No    Lack of Transportation (Non-Medical): No  Physical Activity: Not on file  Stress: Not on file  Social Connections: Unknown (11/21/2022)   Received from [facility]   Social Network    Social Network: Not on file  Intimate Partner Violence: Unknown (11/21/2022)   Received from [facility]   HITS    Physically Hurt: Not on file    Insult or Talk Down To: Not on file    Threaten Physical Harm: Not on file    Scream or Curse: Not on file    Past Surgical History:  Procedure Laterality Date   CESAREAN SECTION     x 2   CHOLECYSTECTOMY  2008   COLONOSCOPY WITH PROPOFOL  N/A 08/06/2021   Procedure: COLONOSCOPY WITH PROPOFOL ;  Surgeon: Jinny Carmine, MD;  Location: [facility];  Service: Endoscopy;  Laterality: N/A;   DILATION AND CURETTAGE OF UTERUS     X 2   ESOPHAGOGASTRODUODENOSCOPY (EGD) WITH PROPOFOL  N/A 08/06/2021   Procedure: ESOPHAGOGASTRODUODENOSCOPY (EGD) WITH PROPOFOL ;  Surgeon: Jinny Carmine, MD;  Location: [facility];  Service: Endoscopy;  Laterality: N/A;   LAPAROSCOPIC BILATERAL SALPINGECTOMY Bilateral 07/29/2017   Procedure: LAPAROSCOPIC LEFT SALPINGOOPHRECTOMY: Partial right Salpingectomy;  Surgeon: Corene Coy, MD;  Location: [facility];  Service: Gynecology;  Laterality: Bilateral;   POLYPECTOMY  08/06/2021   Procedure: POLYPECTOMY;  Surgeon: Jinny Carmine, MD;  Location: [facility];   Service: Endoscopy;;   SHOULDER SURGERY  2016   arthroscopic   TOTAL ABDOMINAL HYSTERECTOMY  2006   Fibroids, benign pathology   TUBAL LIGATION     WISDOM TOOTH EXTRACTION     at age 36 yr   WRIST ARTHROSCOPY Right 2007    Family History  Problem Relation Age of Onset   Hypertension Mother    Ovarian cancer Mother    Other Father        unknown medical history   Hypertension Maternal Grandmother    Heart failure Maternal Grandmother    Lung cancer Maternal Grandmother    Breast cancer Maternal Aunt    Colon cancer Neg Hx    Colon polyps Neg Hx    Rectal  cancer Neg Hx    Stomach cancer Neg Hx     Allergies  Allergen Reactions   Aczone [Dapsone] Swelling   Levaquin  [Levofloxacin ]     Presumed cause of joint aches    Current Outpatient Medications on File Prior to Visit  Medication Sig Dispense Refill   aspirin  81 MG EC tablet Take by mouth.     azelastine  (ASTELIN ) 0.1 % nasal spray Place into the nose.     clindamycin (CLEOCIN T) 1 % external solution SMARTSIG:Topical     famotidine  (PEPCID ) 20 MG tablet Take 1 tablet (20 mg total) by mouth 2 (two) times daily. 30 tablet 0   fluticasone  (FLONASE ) 50 MCG/ACT nasal spray Place 1 spray into both nostrils 2 (two) times daily. 16 g 2   gabapentin (NEURONTIN) 600 MG tablet Take 1,200 mg by mouth daily.     hydrochlorothiazide  (HYDRODIURIL ) 25 MG tablet Take 1 tablet (25 mg total) by mouth daily. for blood pressure. 90 tablet 0   hydrocortisone 2.5 % cream SMARTSIG:Topical     ketoconazole (NIZORAL) 2 % cream SMARTSIG:1 Application Topical 1 to 2 Times Daily     promethazine -dextromethorphan (PROMETHAZINE -DM) 6.25-15 MG/5ML syrup Take 5 mLs by mouth 4 (four) times daily as needed. 118 mL 0   tiZANidine  (ZANAFLEX ) 2 MG tablet Take between 1mg  (Half a tablet) and 4mg  (two tablets) up to three times daily as needed for musculoskeletal pain or muscle spasms.     valACYclovir  (VALTREX ) 500 MG tablet Take 1 tablet (500 mg total) by  mouth daily. For suppression.  Take two tablets for 5 days in the event of an outbreak. 90 tablet 4   No current facility-administered medications on file prior to visit.    BP (!) 148/86   Pulse 94   Temp 97.8 F (36.6 C) (Temporal)   Ht 5' 6 (1.676 m)   Wt 168 lb (76.2 kg)   SpO2 99%   BMI 27.12 kg/m  Objective:   Physical Exam Constitutional:      Appearance: She is not ill-appearing.  HENT:     Right Ear: Tympanic membrane and ear canal normal.     Left Ear: Tympanic membrane and ear canal normal.     Nose: No mucosal edema.     Right Sinus: No maxillary sinus tenderness or frontal sinus tenderness.     Left Sinus: No maxillary sinus tenderness or frontal sinus tenderness.  Eyes:     Conjunctiva/sclera: Conjunctivae normal.  Cardiovascular:     Rate and Rhythm: Normal rate and regular rhythm.  Pulmonary:     Effort: Pulmonary effort is normal.     Breath sounds: Normal breath sounds. No wheezing or rhonchi.  Musculoskeletal:     Cervical back: Neck supple.  Skin:    General: Skin is warm and dry.     Physical Exam        Assessment & Plan:  Post-viral cough syndrome Assessment & Plan: Differentials include postnasal drip, uncontrolled GERD. No evidence of infectious cause today.  Start Zyrtec  10 mg at bedtime for postnasal drip. Start prednisone  20 mg tablets. Take 2 tablets by mouth once daily in the morning for 5 days for cough.  Continue pantoprazole  40 mg twice daily. Continue good water  intake.  Consider inhaled corticosteroid if needed.   Orders: -     predniSONE ; Take 2 tablets by mouth once daily in the morning for 5 days.  Dispense: 10 tablet; Refill: 0 -     Cetirizine  HCl;  Take 1 tablet (10 mg total) by mouth at bedtime. For throat drainage  Dispense: 90 tablet; Refill: 0  Gastroesophageal reflux disease, unspecified whether esophagitis present -     Pantoprazole  Sodium; Take 1 tablet (40 mg total) by mouth 2 (two) times daily. for  heartburn.  Dispense: 180 tablet; Refill: 2  Hyperlipidemia, unspecified hyperlipidemia type -     Lipid panel -     Atorvastatin  Calcium ; Take 1 tablet (10 mg total) by mouth daily. for cholesterol.  Dispense: 90 tablet; Refill: 2  Urinary frequency Assessment & Plan: UA today negative.  Symptoms today could be secondary to increased water  intake, and sacroiliac joint pain.  Orders: -     POCT Urinalysis Dipstick (Automated)  Prediabetes -     Hemoglobin A1c    Assessment and Plan Assessment & Plan         Comer MARLA Gaskins, NP    History of Present Illness

## 2023-11-20 NOTE — Telephone Encounter (Signed)
 Kelli, did you accidentally put the urobilinogen level in incorrectly?  Perhaps you meant 0.4?  Let me know.

## 2023-11-26 ENCOUNTER — Other Ambulatory Visit

## 2023-11-26 DIAGNOSIS — R8290 Unspecified abnormal findings in urine: Secondary | ICD-10-CM | POA: Diagnosis not present

## 2023-11-26 LAB — URINALYSIS, ROUTINE W REFLEX MICROSCOPIC
Bilirubin Urine: NEGATIVE
Hgb urine dipstick: NEGATIVE
Ketones, ur: NEGATIVE
Leukocytes,Ua: NEGATIVE
Nitrite: NEGATIVE
Specific Gravity, Urine: 1.02 (ref 1.000–1.030)
Total Protein, Urine: NEGATIVE
Urine Glucose: NEGATIVE
Urobilinogen, UA: 0.2 (ref 0.0–1.0)
pH: 6.5 (ref 5.0–8.0)

## 2023-12-01 ENCOUNTER — Ambulatory Visit: Payer: Self-pay | Admitting: Primary Care

## 2023-12-07 ENCOUNTER — Encounter: Payer: Self-pay | Admitting: Emergency Medicine

## 2023-12-07 ENCOUNTER — Ambulatory Visit
Admission: EM | Admit: 2023-12-07 | Discharge: 2023-12-07 | Disposition: A | Discharge status: Home / Self Care | Attending: Physician Assistant | Admitting: Physician Assistant

## 2023-12-07 DIAGNOSIS — N390 Urinary tract infection, site not specified: Secondary | ICD-10-CM | POA: Diagnosis not present

## 2023-12-07 LAB — URINALYSIS, W/ REFLEX TO CULTURE (INFECTION SUSPECTED)
Bilirubin Urine: NEGATIVE
Glucose, UA: NEGATIVE mg/dL
Ketones, ur: NEGATIVE mg/dL
Nitrite: POSITIVE — AB
Protein, ur: 30 mg/dL — AB
Specific Gravity, Urine: 1.02 (ref 1.005–1.030)
pH: 7 (ref 5.0–8.0)

## 2023-12-07 MED ORDER — PHENAZOPYRIDINE HCL 200 MG PO TABS: 200.0000 mg | ORAL_TABLET | Freq: Three times a day (TID) | ORAL | 0 refills | Status: DC

## 2023-12-07 MED ORDER — NITROFURANTOIN MONOHYD MACRO 100 MG PO CAPS: 100.0000 mg | ORAL_CAPSULE | Freq: Two times a day (BID) | ORAL | 0 refills | Status: DC

## 2023-12-07 NOTE — ED Provider Notes (Signed)
 [facility]    CSN: [id number] Arrival date & time: 12/07/23  9093      History   Chief Complaint Chief Complaint  Patient presents with   Back Pain   Dysuria    HPI Ashley [name] is a 58 y.o. female.   HPI  58 year old female with past medical history significant for allergic rhinitis, hyperlipidemia, GERD, vitamin D  deficiency, TIA, essential hypertension, neuropathy, hypothyroidism, diverticulosis, and anemia presents for evaluation of painful urination along with urinary urgency and frequency and low back pain that started 4 days ago.  She denies any fever, abdominal pain, nausea or vomiting, blood in her urine, vaginal discharge or vaginal itching.  She does have a history of urinary frequency.  Past Medical History:  Diagnosis Date   Abdominal pain 06/23/2018   Acute breast pain 01/30/2022   Acute midline low back pain without sciatica 09/10/2019   Acute right flank pain 06/29/2021   Adverse effect of drug 08/15/2019   Anemia    Bronchitis    2 years ago    [facility] arthritis 2019   base of left thumb    Constipation 04/07/2019   Discoloration of skin 05/17/2021   Diverticulitis of colon    GERD (gastroesophageal reflux disease)    diet controlled , no meds   Hydrosalpinx    bilateral    Hyperlipidemia    recent dx - currently diet control - no meds   Hypertension    Hypothyroid 2003   radio active iodine - no meds   Left-sided weakness 02/12/2019   Ovarian cyst, left 02/23/2016   Urinary frequency 11/30/2018   Vitamin B 12 deficiency     Patient Active Problem List   Diagnosis Date Noted   Spondylolisthesis at L4-L5 level 09/30/2023   Positive ANA (antinuclear antibody) 08/06/2023   Scalp itch 08/01/2023   Facial pain 08/01/2023   Chronic hip pain, right 05/30/2023   Upper extremity pain, anterior, left 12/12/2022   Screening examination for STI 11/07/2022   Frequent headaches 06/11/2022   Chronic pain of right knee 01/30/2022    TMJ (temporomandibular joint syndrome) 09/07/2021   Polyp of sigmoid colon    Left lower quadrant abdominal pain 06/29/2021   History of diverticulitis 06/29/2021   Adjustment reaction with anxiety and depression 05/09/2021   Osteoarthritis of carpometacarpal (CMC) joint of thumb 04/09/2021   Pain of right thumb 04/09/2021   Post-viral cough syndrome 10/04/2020   Palpitations 10/04/2020   Prediabetes 06/15/2020   Chronic hand pain 05/10/2020   Neuropathy 12/13/2019   Lumbar stenosis without neurogenic claudication 12/09/2019   Cervical radiculitis 11/19/2019   Foraminal stenosis of cervical region 11/19/2019   Dizziness 10/07/2019   Chronic bilateral low back pain 09/10/2019   HSV (herpes simplex virus) anogenital infection 06/25/2019   Encounter for annual general medical examination with abnormal findings in adult 06/23/2019   Chronic neck pain 02/17/2019   Essential hypertension    History of TIA (transient ischemic attack)    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   Numbness and tingling 02/01/2019   Urinary frequency 11/30/2018   Ganglion cyst of tendon sheath of left hand 11/13/2018   Acquired trigger finger 10/26/2018   Osteoarthritis 06/30/2018   Vitamin D  deficiency 06/23/2018   Vitamin B 12 deficiency 06/23/2018   Generalized abdominal pain 06/23/2018   GERD (gastroesophageal reflux disease) 01/30/2018   Hyperlipidemia 03/19/2017   Allergic rhinitis 03/19/2017   Injury of right shoulder 04/18/2015   Impingement syndrome of left shoulder  12/27/2014   Impingement syndrome of right shoulder 12/27/2014   Chronic right shoulder pain 11/29/2014    Past Surgical History:  Procedure Laterality Date   CESAREAN SECTION     x 2   CHOLECYSTECTOMY  2008   COLONOSCOPY WITH PROPOFOL  N/A 08/06/2021   Procedure: COLONOSCOPY WITH PROPOFOL ;  Surgeon: Jinny Carmine, MD;  Location: [facility];  Service: Endoscopy;  Laterality: N/A;   DILATION AND CURETTAGE OF UTERUS     X 2    ESOPHAGOGASTRODUODENOSCOPY (EGD) WITH PROPOFOL  N/A 08/06/2021   Procedure: ESOPHAGOGASTRODUODENOSCOPY (EGD) WITH PROPOFOL ;  Surgeon: Jinny Carmine, MD;  Location: [facility];  Service: Endoscopy;  Laterality: N/A;   LAPAROSCOPIC BILATERAL SALPINGECTOMY Bilateral 07/29/2017   Procedure: LAPAROSCOPIC LEFT SALPINGOOPHRECTOMY: Partial right Salpingectomy;  Surgeon: Corene Coy, MD;  Location: [facility];  Service: Gynecology;  Laterality: Bilateral;   POLYPECTOMY  08/06/2021   Procedure: POLYPECTOMY;  Surgeon: Jinny Carmine, MD;  Location: [facility];  Service: Endoscopy;;   SHOULDER SURGERY  2016   arthroscopic   TOTAL ABDOMINAL HYSTERECTOMY  2006   Fibroids, benign pathology   TUBAL LIGATION     WISDOM TOOTH EXTRACTION     at age 83 yr   WRIST ARTHROSCOPY Right 2007    OB History     Gravida  5   Para  3   Term  3   Preterm  0   AB  2   Living  3      SAB  2   IAB  0   Ectopic  0   Multiple  0   Live Births  3            Home Medications    Prior to Admission medications   Medication Sig Start Date End Date Taking? Authorizing Provider  nitrofurantoin , macrocrystal-monohydrate, (MACROBID ) 100 MG capsule Take 1 capsule (100 mg total) by mouth 2 (two) times daily. 12/07/23  Yes Bernardino Ditch, NP  phenazopyridine  (PYRIDIUM ) 200 MG tablet Take 1 tablet (200 mg total) by mouth 3 (three) times daily. 12/07/23  Yes Bernardino Ditch, NP  aspirin  81 MG EC tablet Take by mouth. 09/16/19   [provider]  atorvastatin  (LIPITOR) 10 MG tablet Take 1 tablet (10 mg total) by mouth daily. for cholesterol. 11/19/23   Clark, Katherine K, NP  azelastine  (ASTELIN ) 0.1 % nasal spray Place into the nose. 03/11/19   [provider]  cetirizine  (ZYRTEC ) 10 MG tablet Take 1 tablet (10 mg total) by mouth at bedtime. For throat drainage 11/19/23   Clark, Katherine K, NP  clindamycin (CLEOCIN T) 1 % external solution SMARTSIG:Topical  09/11/23   [provider]  famotidine  (PEPCID ) 20 MG tablet Take 1 tablet (20 mg total) by mouth 2 (two) times daily. 10/22/23   Brimage, Vondra, DO  fluticasone  (FLONASE ) 50 MCG/ACT nasal spray Place 1 spray into both nostrils 2 (two) times daily. 11/19/19   Tower, Laine LABOR, MD  gabapentin (NEURONTIN) 600 MG tablet Take 1,200 mg by mouth daily. 01/25/20   [provider]  hydrochlorothiazide  (HYDRODIURIL ) 25 MG tablet Take 1 tablet (25 mg total) by mouth daily. for blood pressure. 08/20/23   Clark, Katherine K, NP  hydrocortisone 2.5 % cream SMARTSIG:Topical 09/15/23   [provider]  ketoconazole (NIZORAL) 2 % cream SMARTSIG:1 Application Topical 1 to 2 Times Daily 09/15/23   [provider]  pantoprazole  (PROTONIX ) 40 MG tablet Take 1 tablet (40 mg total) by mouth 2 (two) times  daily. for heartburn. 11/19/23 11/18/24  Clark, Katherine K, NP  promethazine -dextromethorphan (PROMETHAZINE -DM) 6.25-15 MG/5ML syrup Take 5 mLs by mouth 4 (four) times daily as needed. 10/22/23   Brimage, Vondra, DO  tiZANidine  (ZANAFLEX ) 2 MG tablet Take between 1mg  (Half a tablet) and 4mg  (two tablets) up to three times daily as needed for musculoskeletal pain or muscle spasms. 10/16/23   [provider]  valACYclovir  (VALTREX ) 500 MG tablet Take 1 tablet (500 mg total) by mouth daily. For suppression.  Take two tablets for 5 days in the event of an outbreak. 03/06/23   Herchel Gloris LABOR, MD    Family History Family History  Problem Relation Age of Onset   Hypertension Mother    Ovarian cancer Mother    Other Father        unknown medical history   Hypertension Maternal Grandmother    Heart failure Maternal Grandmother    Lung cancer Maternal Grandmother    Breast cancer Maternal Aunt    Colon cancer Neg Hx    Colon polyps Neg Hx    Rectal cancer Neg Hx    Stomach cancer Neg Hx     Social History Social History   Tobacco Use   Smoking status: Never   Smokeless tobacco:  Never  Vaping Use   Vaping status: Never Used  Substance Use Topics   Alcohol use: No   Drug use: No     Allergies   Aczone [dapsone] and Levaquin  [levofloxacin ]   Review of Systems Review of Systems  Constitutional:  Negative for fever.  Gastrointestinal:  Negative for abdominal pain, nausea and vomiting.  Genitourinary:  Positive for dysuria, frequency and urgency. Negative for hematuria, vaginal discharge and vaginal pain.  Musculoskeletal:  Positive for back pain.     Physical Exam Triage Vital Signs ED Triage Vitals  Encounter Vitals Group     BP      Girls Systolic BP Percentile      Girls Diastolic BP Percentile      Boys Systolic BP Percentile      Boys Diastolic BP Percentile      Pulse      Resp      Temp      Temp src      SpO2      Weight      Height      Head Circumference      Peak Flow      Pain Score      Pain Loc      Pain Education      Exclude from Growth Chart    No data found.  Updated Vital Signs BP (!) 162/98 (BP Location: Right Arm)   Pulse 79   Temp 98.6 F (37 C) (Oral)   Resp 14   Ht 5' 6 (1.676 m)   Wt 168 lb (76.2 kg)   SpO2 97%   BMI 27.12 kg/m   Visual Acuity Right Eye Distance:   Left Eye Distance:   Bilateral Distance:    Right Eye Near:   Left Eye Near:    Bilateral Near:     Physical Exam Vitals and nursing note reviewed.  Constitutional:      Appearance: Normal appearance. She is not ill-appearing.  HENT:     Head: Normocephalic and atraumatic.  Cardiovascular:     Rate and Rhythm: Normal rate and regular rhythm.     Pulses: Normal pulses.     Heart sounds: Normal heart sounds.  No murmur heard.    No friction rub. No gallop.  Pulmonary:     Effort: Pulmonary effort is normal.     Breath sounds: Normal breath sounds. No wheezing, rhonchi or rales.  Abdominal:     Tenderness: There is no right CVA tenderness or left CVA tenderness.  Skin:    General: Skin is warm and dry.     Capillary Refill:  Capillary refill takes less than 2 seconds.     Findings: No rash.  Neurological:     General: No focal deficit present.     Mental Status: She is alert and oriented to person, place, and time.      UC Treatments / Results  Labs (all labs ordered are listed, but only abnormal results are displayed) Labs Reviewed  URINALYSIS, W/ REFLEX TO CULTURE (INFECTION SUSPECTED) - Abnormal; Notable for the following components:      Result Value   APPearance HAZY (*)    Hgb urine dipstick TRACE (*)    Protein, ur 30 (*)    Nitrite POSITIVE (*)    Leukocytes,Ua MODERATE (*)    Bacteria, UA MANY (*)    All other components within normal limits  URINE CULTURE    EKG   Radiology No results found.  Procedures Procedures (including critical care time)  Medications Ordered in UC Medications - No data to display  Initial Impression / Assessment and Plan / UC Course  I have reviewed the triage vital signs and the nursing notes.  Pertinent labs & imaging results that were available during my care of the patient were reviewed by me and considered in my medical decision making (see chart for details).   Patient is a nontoxic-appearing 58 year old female presenting for evaluation of UTI symptoms that started 4 days ago as outlined in the HPI above.  She does have a history of urinary frequency but no documented history of UTI in epic.  She has no CVA tenderness on exam.  I will order a urinalysis to assess for the presence of UTI.  Urinalysis has a hazy appearance with trace hemoglobin, 30 protein, nitrite positive with moderate leukocyte esterase.  Reflex microscopy shows 21-50 WBCs with many bacteria.  Urine will reflex to culture.  I will discharge patient on the diagnosis of UTI and start her on Macrobid  100 mg twice daily for 5 days along with Pyridium  200 mg every 8 hours as needed for urinary discomfort.  I will encourage the patient to increase oral fluid intake such increases her urine  production to help flush her urinary tract.  Return precautions reviewed.   Final Clinical Impressions(s) / UC Diagnoses   Final diagnoses:  Lower urinary tract infectious disease     Discharge Instructions      Take the Macrobid  twice daily for 5 days with food for treatment of urinary tract infection.  Use the Pyridium  every 8 hours as needed for urinary discomfort.  This will turn your urine a bright red-orange.  Increase your oral fluid intake so that you increase your urine production and or flushing your urinary system.  Take an over-the-counter probiotic, such as Culturelle-Align-Activia, 1 hour after each dose of antibiotic to prevent diarrhea or yeast infections from forming.  We will culture urine and change the antibiotics if necessary.  Return for reevaluation, or see your primary care provider, for any new or worsening symptoms.      ED Prescriptions     Medication Sig Dispense Auth. Provider   nitrofurantoin ,  macrocrystal-monohydrate, (MACROBID ) 100 MG capsule Take 1 capsule (100 mg total) by mouth 2 (two) times daily. 10 capsule Bernardino Ditch, NP   phenazopyridine  (PYRIDIUM ) 200 MG tablet Take 1 tablet (200 mg total) by mouth 3 (three) times daily. 6 tablet Bernardino Ditch, NP      PDMP not reviewed this encounter.   Bernardino Ditch, NP 12/07/23 [phone]

## 2023-12-07 NOTE — Discharge Instructions (Addendum)

## 2023-12-07 NOTE — ED Triage Notes (Signed)
 Patient c/o lower back pain, dysuria and urinary frequency that started 4 days ago.

## 2023-12-10 ENCOUNTER — Ambulatory Visit (HOSPITAL_COMMUNITY): Payer: Self-pay

## 2023-12-10 LAB — URINE CULTURE: Culture: >=100000 — AB

## 2023-12-10 MED ORDER — CEPHALEXIN 500 MG PO CAPS: 500.0000 mg | ORAL_CAPSULE | Freq: Two times a day (BID) | ORAL | 0 refills | Status: AC

## 2023-12-17 ENCOUNTER — Ambulatory Visit: Admitting: Family

## 2023-12-17 VITALS — BP 134/82 | HR 78 | Temp 98.6°F | Ht 66.0 in | Wt 171.0 lb

## 2023-12-17 DIAGNOSIS — N3000 Acute cystitis without hematuria: Secondary | ICD-10-CM

## 2023-12-17 DIAGNOSIS — N898 Other specified noninflammatory disorders of vagina: Secondary | ICD-10-CM | POA: Diagnosis not present

## 2023-12-17 DIAGNOSIS — J301 Allergic rhinitis due to pollen: Secondary | ICD-10-CM | POA: Diagnosis not present

## 2023-12-17 DIAGNOSIS — K2100 Gastro-esophageal reflux disease with esophagitis, without bleeding: Secondary | ICD-10-CM | POA: Diagnosis not present

## 2023-12-17 MED ORDER — AZELASTINE HCL 0.1 % NA SOLN: 1.0000 | Freq: Two times a day (BID) | NASAL | 12 refills | Status: AC

## 2023-12-17 MED ORDER — OMEPRAZOLE 40 MG PO CPDR: 40.0000 mg | DELAYED_RELEASE_CAPSULE | Freq: Two times a day (BID) | ORAL | 3 refills | Status: AC

## 2023-12-17 NOTE — Progress Notes (Signed)
 Established Patient Office Visit  Subjective:      CC:  Chief Complaint  Patient presents with   Urinary Tract Infection    Was treated finished abx 2 days ago. Still having some some lower abdominal/ back pain and frequency    Nasal Congestion    Mucus in throat otc meds not helping     HPI: Ashley [name] is a 58 y.o. female presenting on 12/17/2023 for Urinary Tract Infection (Was treated finished abx 2 days ago. Still having some some lower abdominal/ back pain and frequency ) and Nasal Congestion (Mucus in throat otc meds not helping ) .  Discussed the use of AI scribe software for clinical note transcription with the patient, who gave verbal consent to proceed.  History of Present Illness Ashley [name] is a 58 year old female who presents with persistent urinary discomfort and post-COVID respiratory symptoms.  She experienced a severe urinary tract infection that began on October 12th, initially treated with Macrobid , which was later changed to Keflex  500 mg twice a day for five days due to resistance. She completed the course two days ago. While the burning sensation has subsided, she continues to experience lower abdominal pain, particularly when her bladder fills, and this pain radiates to her back. She also reports mild external vaginal discomfort. No current burning during urination, but there is lower abdominal and back pain. No vaginal itching or discharge.  She is nearly two months post-COVID and continues to experience persistent mucus in her throat, despite trying Mucinex and warm tea. The mucus seems to accumulate, causing excessive saliva and a sensation of mucus in the back of her throat. She has been using nasal sprays, specifically Flonase , but ran out of Astelin . She tried cetirizine  (Zyrtec ) for a week, which provided slight improvement but did not resolve the issue. She also experiences coughing due to drainage.  She has a history of acid reflux  and has been on pantoprazole , which she takes twice daily, but reports it is not effective. Her reflux symptoms have worsened since having COVID, with episodes of intense discomfort. She has not been diagnosed with Barrett's esophagus, and she recalls that her last upper GI endoscopy, performed less than a year ago, was reported as normal by her GI doctor. She experiences frequent burping and occasional tenderness in the upper abdomen. She has not been tested for H. pylori recently. She experiences heartburn and frequent burping.         Social history:  Relevant past medical, surgical, family and social history reviewed and updated as indicated. Interim medical history since our last visit reviewed.  Allergies and medications reviewed and updated.  DATA REVIEWED: CHART IN EPIC     ROS: Negative unless specifically indicated above in HPI.    Current Outpatient Medications:    aspirin  81 MG EC tablet, Take by mouth., Disp: , Rfl:    atorvastatin  (LIPITOR) 10 MG tablet, Take 1 tablet (10 mg total) by mouth daily. for cholesterol., Disp: 90 tablet, Rfl: 2   azelastine  (ASTELIN ) 0.1 % nasal spray, Place 1 spray into both nostrils 2 (two) times daily. Use in each nostril as directed, Disp: 30 mL, Rfl: 12   cetirizine  (ZYRTEC ) 10 MG tablet, Take 1 tablet (10 mg total) by mouth at bedtime. For throat drainage, Disp: 90 tablet, Rfl: 0   clindamycin (CLEOCIN T) 1 % external solution, SMARTSIG:Topical, Disp: , Rfl:    famotidine  (PEPCID ) 20 MG tablet, Take 1 tablet (20 mg  total) by mouth 2 (two) times daily., Disp: 30 tablet, Rfl: 0   fluticasone  (FLONASE ) 50 MCG/ACT nasal spray, Place 1 spray into both nostrils 2 (two) times daily., Disp: 16 g, Rfl: 2   gabapentin (NEURONTIN) 600 MG tablet, Take 1,200 mg by mouth daily., Disp: , Rfl:    hydrochlorothiazide  (HYDRODIURIL ) 25 MG tablet, Take 1 tablet (25 mg total) by mouth daily. for blood pressure., Disp: 90 tablet, Rfl: 0   hydrocortisone 2.5  % cream, SMARTSIG:Topical, Disp: , Rfl:    ketoconazole (NIZORAL) 2 % cream, SMARTSIG:1 Application Topical 1 to 2 Times Daily, Disp: , Rfl:    omeprazole  (PRILOSEC) 40 MG capsule, Take 1 capsule (40 mg total) by mouth 2 (two) times daily., Disp: 60 capsule, Rfl: 3   tiZANidine  (ZANAFLEX ) 2 MG tablet, Take between 1mg  (Half a tablet) and 4mg  (two tablets) up to three times daily as needed for musculoskeletal pain or muscle spasms., Disp: , Rfl:    valACYclovir  (VALTREX ) 500 MG tablet, Take 1 tablet (500 mg total) by mouth daily. For suppression.  Take two tablets for 5 days in the event of an outbreak., Disp: 90 tablet, Rfl: 4        Objective:        BP 134/82   Pulse 78   Temp 98.6 F (37 C) (Oral)   Ht 5' 6 (1.676 m)   Wt 171 lb (77.6 kg)   SpO2 99%   BMI 27.60 kg/m   Physical Exam HEENT: Throat not terribly red ABDOMEN: Mild abdominal tenderness, including mild tenderness under abdomen  Wt Readings from Last 3 Encounters:  12/17/23 171 lb (77.6 kg)  12/07/23 168 lb (76.2 kg)  11/19/23 168 lb (76.2 kg)    Physical Exam Constitutional:      General: She is not in acute distress.    Appearance: Normal appearance. She is normal weight. She is not ill-appearing, toxic-appearing or diaphoretic.  HENT:     Head: Normocephalic.     Right Ear: Tympanic membrane normal.     Left Ear: Tympanic membrane normal.     Nose: Nose normal.     Mouth/Throat:     Mouth: Mucous membranes are dry.     Pharynx: Postnasal drip present. No oropharyngeal exudate or posterior oropharyngeal erythema.  Eyes:     Extraocular Movements: Extraocular movements intact.     Pupils: Pupils are equal, round, and reactive to light.  Cardiovascular:     Rate and Rhythm: Normal rate and regular rhythm.     Pulses: Normal pulses.     Heart sounds: Normal heart sounds.  Pulmonary:     Effort: Pulmonary effort is normal.     Breath sounds: Normal breath sounds.  Abdominal:     Palpations: Abdomen  is soft.     Tenderness: There is abdominal tenderness in the epigastric area and suprapubic area.  Musculoskeletal:     Cervical back: Normal range of motion.  Neurological:     General: No focal deficit present.     Mental Status: She is alert and oriented to person, place, and time. Mental status is at baseline.  Psychiatric:        Mood and Affect: Mood normal.        Behavior: Behavior normal.        Thought Content: Thought content normal.        Judgment: Judgment normal.          Results LABS Urine culture: Proteus mirabilis, resistant  to initial antibiotic (12/07/2023) H. pylori test: Negative (2024)  RADIOLOGY Upper GI endoscopy: Normal, no Barrett's esophagus (2024)  Assessment & Plan:   Assessment and Plan Assessment & Plan Urinary tract infection, recent, partially improved Recent urinary tract infection treated with Keflex  for five days. Symptoms have partially improved, but she reports persistent lower abdominal discomfort and back pain. No burning sensation during urination currently. Differential includes possible residual infection or related back issues. Potential for bacterial vaginitis or yeast infection due to symptoms of external irritation. - Order repeat urine culture - Instruct her to self-swab for bacterial vaginitis and yeast - Await results before prescribing further antibiotics  Chronic gastroesophageal reflux disease (GERD) Chronic GERD with persistent symptoms despite high-dose pantoprazole . Symptoms include persistent mucus in throat, cough, and heartburn. Symptoms exacerbated post-COVID. No Barrett's esophagus on recent endoscopy. Considering switch to omeprazole  due to potential tachyphylaxis with pantoprazole . - Switch pantoprazole  to omeprazole  twice daily - Refer to GI specialist for further evaluation due to high-dose PPI requirement  Chronic postnasal drip and cough, likely allergic and reflux-related Chronic postnasal drip and cough  likely due to allergies and reflux. Symptoms include mucus accumulation in throat and cough. Previous treatment with cetirizine  provided partial relief. Currently using Flonase  and ran out of Astelin  spray, which may contribute to symptoms. - Refill Astelin  nasal spray - Continue Flonase  nasal spray - Advise continuation of cetirizine  for more than one week if needed       Return for f/u PCP if no improvement in symptoms.     Ginger Patrick, MSN, APRN, FNP-C [facility]

## 2023-12-18 ENCOUNTER — Ambulatory Visit: Payer: Self-pay | Admitting: Family

## 2023-12-18 DIAGNOSIS — B9689 Other specified bacterial agents as the cause of diseases classified elsewhere: Secondary | ICD-10-CM

## 2023-12-18 LAB — WET PREP BY MOLECULAR PROBE
Candida species: NOT DETECTED
MICRO NUMBER:: 17133411
SPECIMEN QUALITY:: ADEQUATE
Trichomonas vaginosis: NOT DETECTED

## 2023-12-18 LAB — URINE CULTURE
MICRO NUMBER:: 17133403
Result:: NO GROWTH
SPECIMEN QUALITY:: ADEQUATE

## 2023-12-18 MED ORDER — METRONIDAZOLE 500 MG PO TABS: 500.0000 mg | ORAL_TABLET | Freq: Two times a day (BID) | ORAL | 0 refills | Status: AC

## 2023-12-26 ENCOUNTER — Other Ambulatory Visit (HOSPITAL_COMMUNITY): Payer: Self-pay

## 2023-12-26 ENCOUNTER — Telehealth: Payer: Self-pay

## 2023-12-26 NOTE — Telephone Encounter (Signed)
 Pharmacy Patient Advocate Encounter   Received notification from Onbase that prior authorization for Pantoprazole  40 DR caps is required/requested.   Insurance verification completed.   The patient is insured through [employer].   Per test claim: plan limitations are exceeded 11/19/23 patient filled 180 pantoprazole  12/17/23 patient filled 60 dexlansoprazole  PA request not submitted

## 2023-12-27 NOTE — Telephone Encounter (Signed)
 Looks like Ashley [name] prescribed omeprazole  40 mg on 12/17/23. Pantoprazole  no longer on medication list.   Can we make sure she knows not to take both pantoprazole  and omeprazole  for heartburn?

## 2023-12-29 NOTE — Telephone Encounter (Signed)
 LM for pt to return call.

## 2023-12-29 NOTE — Telephone Encounter (Signed)
 Spoke with pt and she states that she is taking omeprazole .

## 2023-12-30 NOTE — Telephone Encounter (Signed)
 Noted

## 2024-01-08 ENCOUNTER — Ambulatory Visit: Admitting: Primary Care

## 2024-01-23 ENCOUNTER — Emergency Department

## 2024-01-23 ENCOUNTER — Emergency Department: Admission: EM | Admit: 2024-01-23 | Discharge: 2024-01-23 | Disposition: A | Discharge status: Home / Self Care

## 2024-01-23 ENCOUNTER — Other Ambulatory Visit: Payer: Self-pay

## 2024-01-23 DIAGNOSIS — M546 Pain in thoracic spine: Secondary | ICD-10-CM | POA: Insufficient documentation

## 2024-01-23 DIAGNOSIS — H9202 Otalgia, left ear: Secondary | ICD-10-CM | POA: Insufficient documentation

## 2024-01-23 DIAGNOSIS — Y92410 Unspecified street and highway as the place of occurrence of the external cause: Secondary | ICD-10-CM | POA: Diagnosis not present

## 2024-01-23 DIAGNOSIS — M79672 Pain in left foot: Secondary | ICD-10-CM | POA: Diagnosis not present

## 2024-01-23 DIAGNOSIS — I10 Essential (primary) hypertension: Secondary | ICD-10-CM | POA: Diagnosis not present

## 2024-01-23 DIAGNOSIS — M542 Cervicalgia: Secondary | ICD-10-CM | POA: Diagnosis present

## 2024-01-23 DIAGNOSIS — M545 Low back pain, unspecified: Secondary | ICD-10-CM

## 2024-01-23 MED ORDER — KETOROLAC TROMETHAMINE 15 MG/ML IJ SOLN
15.0000 mg | Freq: Once | INTRAMUSCULAR | Status: AC
  Administered 2024-01-23: 15 mg via INTRAMUSCULAR
  Filled 2024-01-23: qty 1

## 2024-01-23 MED ORDER — METHOCARBAMOL 750 MG PO TABS: 750.0000 mg | ORAL_TABLET | Freq: Four times a day (QID) | ORAL | 0 refills | Status: AC

## 2024-01-23 MED ORDER — METHOCARBAMOL 500 MG PO TABS
750.0000 mg | ORAL_TABLET | Freq: Once | ORAL | Status: AC
  Administered 2024-01-23: 750 mg via ORAL
  Filled 2024-01-23: qty 2
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
## 2024-01-23 NOTE — Discharge Instructions (Signed)
 1) workup today demonstrated no life-threatening problems but this does not mean nothing is wrong.  Please follow-up with your primary care physician to discuss whether you need we referral to physical therapy or an outpatient MRI. 2) I have prescribed methocarbamol but do not take this if you are taking your tinazidine -- RETURN PRECAUTIONS & AFTERCARE: (ENGLISH) RETURN PRECAUTIONS: Return immediately to the emergency department or see/call your doctor if you feel worse, weak or have changes in speech or vision, are short of breath, have fever, vomiting, pain, bleeding or dark stool, trouble urinating or any new issues. Return here or see/call your doctor if not improving as expected for your suspected condition. FOLLOW-UP CARE: Call your doctor and/or any doctors we referred you to for more advice and to make an appointment. Do this today, tomorrow or after the weekend. Some doctors only take PPO insurance so if you have HMO insurance you may want to contact your HMO or your regular doctor for referral to a specialist within your plan. Either way tell the doctor's office that it was a referral from the emergency department so you get the soonest possible appointment.  YOUR TEST RESULTS: Take result reports of any blood or urine tests, imaging tests and EKG's to your doctor and any referral doctor. Have any abnormal tests repeated. Your doctor or a referral doctor can let you know when this should be done. Also make sure your doctor contacts this hospital to get any test results that are not currently available such as cultures or special tests for infection and final imaging reports, which are often not available at the time you leave the ER but which may list additional important findings that are not documented on the preliminary report. BLOOD PRESSURE: If your blood pressure was greater than 120/80 have your blood pressure rechecked within 1 to 2 weeks. MEDICATION SIDE EFFECTS: Do not drive, walk, bike,  take the bus, etc. if you have received or are being prescribed any sedating medications such as those for pain or anxiety or certain antihistamines like Benadryl . If you have been give one of these here get a taxi home or have a friend drive you home. Ask your pharmacist to counsel you on potential side effects of any new medication

## 2024-02-18 ENCOUNTER — Emergency Department: Admission: EM | Admit: 2024-02-18 | Discharge: 2024-02-18 | Disposition: A | Discharge status: Home / Self Care

## 2024-02-18 ENCOUNTER — Emergency Department

## 2024-02-18 DIAGNOSIS — K5732 Diverticulitis of large intestine without perforation or abscess without bleeding: Secondary | ICD-10-CM | POA: Diagnosis not present

## 2024-02-18 DIAGNOSIS — R1084 Generalized abdominal pain: Secondary | ICD-10-CM | POA: Diagnosis present

## 2024-02-18 DIAGNOSIS — R739 Hyperglycemia, unspecified: Secondary | ICD-10-CM | POA: Diagnosis not present

## 2024-02-18 LAB — CBC
HCT: 39.4 % (ref 36.0–46.0)
Hemoglobin: 12.8 g/dL (ref 12.0–15.0)
MCH: 27.5 pg (ref 26.0–34.0)
MCHC: 32.5 g/dL (ref 30.0–36.0)
MCV: 84.7 fL (ref 80.0–100.0)
Platelets: 227 K/uL (ref 150–400)
RBC: 4.65 MIL/uL (ref 3.87–5.11)
RDW: 15.7 % — ABNORMAL HIGH (ref 11.5–15.5)
WBC: 5.5 K/uL (ref 4.0–10.5)
nRBC: 0 % (ref 0.0–0.2)

## 2024-02-18 LAB — URINALYSIS, ROUTINE W REFLEX MICROSCOPIC
Bilirubin Urine: NEGATIVE
Glucose, UA: NEGATIVE mg/dL
Hgb urine dipstick: NEGATIVE
Ketones, ur: NEGATIVE mg/dL
Leukocytes,Ua: NEGATIVE
Nitrite: NEGATIVE
Protein, ur: NEGATIVE mg/dL
Specific Gravity, Urine: 1.012 (ref 1.005–1.030)
pH: 5 (ref 5.0–8.0)

## 2024-02-18 LAB — COMPREHENSIVE METABOLIC PANEL WITH GFR
ALT: 15 U/L (ref 0–44)
AST: 26 U/L (ref 15–41)
Albumin: 4.5 g/dL (ref 3.5–5.0)
Alkaline Phosphatase: 94 U/L (ref 38–126)
Anion gap: 11 (ref 5–15)
BUN: 12 mg/dL (ref 6–20)
CO2: 26 mmol/L (ref 22–32)
Calcium: 9.6 mg/dL (ref 8.9–10.3)
Chloride: 99 mmol/L (ref 98–111)
Creatinine, Ser: 0.77 mg/dL (ref 0.44–1.00)
GFR, Estimated: >60 mL/min
Glucose, Bld: 110 mg/dL — ABNORMAL HIGH (ref 70–99)
Potassium: 3.5 mmol/L (ref 3.5–5.1)
Sodium: 136 mmol/L (ref 135–145)
Total Bilirubin: 0.4 mg/dL (ref 0.0–1.2)
Total Protein: 7.7 g/dL (ref 6.5–8.1)

## 2024-02-18 LAB — LIPASE, BLOOD: Lipase: 25 U/L (ref 11–51)

## 2024-02-18 MED ORDER — AMOXICILLIN-POT CLAVULANATE 875-125 MG PO TABS
1.0000 | ORAL_TABLET | Freq: Once | ORAL | Status: AC
  Administered 2024-02-18: 1 via ORAL
  Filled 2024-02-18: qty 1

## 2024-02-18 MED ORDER — KETOROLAC TROMETHAMINE 30 MG/ML IJ SOLN
30.0000 mg | Freq: Once | INTRAMUSCULAR | Status: AC
  Administered 2024-02-18: 30 mg via INTRAVENOUS
  Filled 2024-02-18: qty 1

## 2024-02-18 MED ORDER — AMOXICILLIN-POT CLAVULANATE 875-125 MG PO TABS: 1.0000 | ORAL_TABLET | Freq: Two times a day (BID) | ORAL | 0 refills | Status: AC

## 2024-02-18 MED ORDER — IOHEXOL 300 MG/ML  SOLN
100.0000 mL | Freq: Once | INTRAMUSCULAR | Status: AC | PRN
  Administered 2024-02-18: 100 mL via INTRAVENOUS

## 2024-02-18 NOTE — Discharge Instructions (Signed)
 Please take your antibiotic as prescribed.  Follow-up with your primary care physician within the next few days.  Return to the emergency department if you have any new or worsening symptoms.

## 2024-02-18 NOTE — ED Triage Notes (Signed)
 Pt presents to the ED via POV from home with generalized abdominal pain x3 days. Denies N/V/D

## 2024-02-18 NOTE — ED Provider Notes (Signed)
 "  [facility] Provider Note    Event Date/Time   First MD Initiated Contact with Patient 02/18/24 1705     (approximate)   History   Abdominal Pain   HPI  Ashley [name] is a 58 y.o. female with a past medical history of diverticulitis presenting to the emergency department complaining of lower abdominal pain for the last 3 days.  She reports that while most of the pain is in her lower abdomen it does seem to radiate all over at this time.  She denies any associated nausea, vomiting, diarrhea, or urinary symptoms.  She states that this does not feel like her previous diverticulitis flareups.     Physical Exam   Triage Vital Signs: ED Triage Vitals  Encounter Vitals Group     BP 02/18/24 1701 (!) 170/111     Girls Systolic BP Percentile --      Girls Diastolic BP Percentile --      Boys Systolic BP Percentile --      Boys Diastolic BP Percentile --      Pulse Rate 02/18/24 1701 100     Resp 02/18/24 1701 18     Temp 02/18/24 1701 98.6 F (37 C)     Temp src --      SpO2 02/18/24 1701 99 %     Weight 02/18/24 1702 170 lb (77.1 kg)     Height 02/18/24 1702 5' 6 (1.676 m)     Head Circumference --      Peak Flow --      Pain Score 02/18/24 1702 10     Pain Loc --      Pain Education --      Exclude from Growth Chart --     Most recent vital signs: Vitals:   02/18/24 1701  BP: (!) 170/111  Pulse: 100  Resp: 18  Temp: 98.6 F (37 C)  SpO2: 99%     General: Awake, no distress.  CV:  Good peripheral perfusion.  Resp:  Normal effort.  Abd:  No distention.  Diffuse tenderness to palpation worse in the suprapubic area Other:     ED Results / Procedures / Treatments   Labs (all labs ordered are listed, but only abnormal results are displayed) Labs Reviewed  COMPREHENSIVE METABOLIC PANEL WITH GFR - Abnormal; Notable for the following components:      Result Value   Glucose, Bld 110 (*)    All other components within normal  limits  CBC - Abnormal; Notable for the following components:   RDW 15.7 (*)    All other components within normal limits  URINALYSIS, ROUTINE W REFLEX MICROSCOPIC - Abnormal; Notable for the following components:   Color, Urine YELLOW (*)    APPearance CLEAR (*)    All other components within normal limits  LIPASE, BLOOD     EKG     RADIOLOGY CT abdomen/pelvis showed acute sigmoid diverticulitis, no abscess or perforation     PROCEDURES:  Critical Care performed: No  Procedures   MEDICATIONS ORDERED IN ED: Medications  ketorolac  (TORADOL ) 30 MG/ML injection 30 mg (30 mg Intravenous Given 02/18/24 1733)     IMPRESSION / MDM / ASSESSMENT AND PLAN / ED COURSE  I reviewed the triage vital signs and the nursing notes.                              Differential diagnosis includes,  but is not limited to, UTI, diverticulitis, diverticulosis, bowel obstruction, appendicitis, gastroenteritis.  Patient's presentation is most consistent with acute presentation with potential threat to life or bodily function.  Patient is a 58 year old female with past medical history of diverticulitis presenting to the emergency department complaining of lower abdominal pain for the last 3 days.  Lab work and CT of the abdomen ordered for further evaluation.  Patient's pain treated with Toradol .  On workup the patient has evidence of sigmoid diverticulitis on CT without evidence of abscess or perforation.  Blood work is overall unremarkable.  Urinalysis is unremarkable.  On reevaluation, the patient reports she is feeling significantly improved after treatment.  Discussed plan to provide her with her first dose of antibiotic here in the emergency department and provide a prescription for the remaining course.  The patient is agreeable.  She will return to the emergency department for any new or worsening symptoms.  Otherwise follow-up with her primary care physician within the next few days.       FINAL CLINICAL IMPRESSION(S) / ED DIAGNOSES   Final diagnoses:  Sigmoid diverticulitis     Rx / DC Orders   ED Discharge Orders          Ordered    amoxicillin -clavulanate (AUGMENTIN ) 875-125 MG tablet  2 times daily        02/18/24 1849             Note:  This document was prepared using Dragon voice recognition software and may include unintentional dictation errors.   Rexford Reche HERO, MD 02/18/24 1911  "

## 2024-02-29 ENCOUNTER — Emergency Department

## 2024-02-29 ENCOUNTER — Inpatient Hospital Stay
Admission: EM | Admit: 2024-02-29 | Discharge: 2024-03-05 | DRG: 392 | Disposition: A | Discharge status: Home / Self Care | Attending: Internal Medicine | Admitting: Internal Medicine

## 2024-02-29 ENCOUNTER — Other Ambulatory Visit: Payer: Self-pay

## 2024-02-29 DIAGNOSIS — Z9049 Acquired absence of other specified parts of digestive tract: Secondary | ICD-10-CM | POA: Diagnosis not present

## 2024-02-29 DIAGNOSIS — K5792 Diverticulitis of intestine, part unspecified, without perforation or abscess without bleeding: Principal | ICD-10-CM | POA: Diagnosis present

## 2024-02-29 DIAGNOSIS — Z8041 Family history of malignant neoplasm of ovary: Secondary | ICD-10-CM | POA: Diagnosis not present

## 2024-02-29 DIAGNOSIS — Z801 Family history of malignant neoplasm of trachea, bronchus and lung: Secondary | ICD-10-CM | POA: Diagnosis not present

## 2024-02-29 DIAGNOSIS — I10 Essential (primary) hypertension: Secondary | ICD-10-CM | POA: Diagnosis present

## 2024-02-29 DIAGNOSIS — E785 Hyperlipidemia, unspecified: Secondary | ICD-10-CM | POA: Diagnosis present

## 2024-02-29 DIAGNOSIS — K219 Gastro-esophageal reflux disease without esophagitis: Secondary | ICD-10-CM | POA: Diagnosis present

## 2024-02-29 DIAGNOSIS — Z8249 Family history of ischemic heart disease and other diseases of the circulatory system: Secondary | ICD-10-CM

## 2024-02-29 DIAGNOSIS — K5732 Diverticulitis of large intestine without perforation or abscess without bleeding: Principal | ICD-10-CM | POA: Diagnosis present

## 2024-02-29 DIAGNOSIS — Z803 Family history of malignant neoplasm of breast: Secondary | ICD-10-CM

## 2024-02-29 DIAGNOSIS — Z90710 Acquired absence of both cervix and uterus: Secondary | ICD-10-CM

## 2024-02-29 LAB — URINALYSIS, ROUTINE W REFLEX MICROSCOPIC
Bacteria, UA: NONE SEEN
Bilirubin Urine: NEGATIVE
Glucose, UA: NEGATIVE mg/dL
Hgb urine dipstick: NEGATIVE
Ketones, ur: NEGATIVE mg/dL
Nitrite: NEGATIVE
Protein, ur: NEGATIVE mg/dL
Specific Gravity, Urine: 1.02 (ref 1.005–1.030)
pH: 5 (ref 5.0–8.0)

## 2024-02-29 LAB — LIPASE, BLOOD: Lipase: 22 U/L (ref 11–51)

## 2024-02-29 LAB — CBC WITH DIFFERENTIAL/PLATELET
Abs Immature Granulocytes: 0.01 K/uL (ref 0.00–0.07)
Basophils Absolute: 0 K/uL (ref 0.0–0.1)
Basophils Relative: 1 %
Eosinophils Absolute: 0 K/uL (ref 0.0–0.5)
Eosinophils Relative: 1 %
HCT: 39.6 % (ref 36.0–46.0)
Hemoglobin: 12.8 g/dL (ref 12.0–15.0)
Immature Granulocytes: 0 %
Lymphocytes Relative: 24 %
Lymphs Abs: 1.4 K/uL (ref 0.7–4.0)
MCH: 27 pg (ref 26.0–34.0)
MCHC: 32.3 g/dL (ref 30.0–36.0)
MCV: 83.5 fL (ref 80.0–100.0)
Monocytes Absolute: 0.4 K/uL (ref 0.1–1.0)
Monocytes Relative: 7 %
Neutro Abs: 3.9 K/uL (ref 1.7–7.7)
Neutrophils Relative %: 67 %
Platelets: 336 K/uL (ref 150–400)
RBC: 4.74 MIL/uL (ref 3.87–5.11)
RDW: 15.1 % (ref 11.5–15.5)
WBC: 5.8 K/uL (ref 4.0–10.5)
nRBC: 0 % (ref 0.0–0.2)

## 2024-02-29 LAB — CBC
HCT: 40.2 % (ref 36.0–46.0)
HCT: 38.9 % (ref 36.0–46.0)
Hemoglobin: 13.1 g/dL (ref 12.0–15.0)
Hemoglobin: 12.9 g/dL (ref 12.0–15.0)
MCH: 27.2 pg (ref 26.0–34.0)
MCH: 27.4 pg (ref 26.0–34.0)
MCHC: 32.6 g/dL (ref 30.0–36.0)
MCHC: 33.2 g/dL (ref 30.0–36.0)
MCV: 83.6 fL (ref 80.0–100.0)
MCV: 82.6 fL (ref 80.0–100.0)
Platelets: 329 K/uL (ref 150–400)
Platelets: 330 K/uL (ref 150–400)
RBC: 4.81 MIL/uL (ref 3.87–5.11)
RBC: 4.71 MIL/uL (ref 3.87–5.11)
RDW: 15.1 % (ref 11.5–15.5)
RDW: 14.9 % (ref 11.5–15.5)
WBC: 5.7 K/uL (ref 4.0–10.5)
WBC: 5.7 K/uL (ref 4.0–10.5)
nRBC: 0 % (ref 0.0–0.2)
nRBC: 0 % (ref 0.0–0.2)

## 2024-02-29 LAB — CREATININE, SERUM
Creatinine, Ser: 0.72 mg/dL (ref 0.44–1.00)
GFR, Estimated: >60 mL/min

## 2024-02-29 LAB — COMPREHENSIVE METABOLIC PANEL WITH GFR
ALT: 12 U/L (ref 0–44)
AST: 22 U/L (ref 15–41)
Albumin: 4.3 g/dL (ref 3.5–5.0)
Alkaline Phosphatase: 103 U/L (ref 38–126)
Anion gap: 13 (ref 5–15)
BUN: 8 mg/dL (ref 6–20)
CO2: 24 mmol/L (ref 22–32)
Calcium: 10.5 mg/dL — ABNORMAL HIGH (ref 8.9–10.3)
Chloride: 101 mmol/L (ref 98–111)
Creatinine, Ser: 0.78 mg/dL (ref 0.44–1.00)
GFR, Estimated: >60 mL/min
Glucose, Bld: 110 mg/dL — ABNORMAL HIGH (ref 70–99)
Potassium: 4.1 mmol/L (ref 3.5–5.1)
Sodium: 138 mmol/L (ref 135–145)
Total Bilirubin: 0.3 mg/dL (ref 0.0–1.2)
Total Protein: 7.9 g/dL (ref 6.5–8.1)

## 2024-02-29 LAB — HIV ANTIBODY (ROUTINE TESTING W REFLEX): HIV Screen 4th Generation wRfx: NONREACTIVE

## 2024-02-29 MED ORDER — ACETAMINOPHEN 650 MG RE SUPP: 650.0000 mg | Freq: Four times a day (QID) | RECTAL | Status: DC | PRN

## 2024-02-29 MED ORDER — HYDRALAZINE HCL 20 MG/ML IJ SOLN: 10.0000 mg | Freq: Four times a day (QID) | INTRAMUSCULAR | Status: DC | PRN

## 2024-02-29 MED ORDER — METRONIDAZOLE 500 MG/100ML IV SOLN
500.0000 mg | Freq: Once | INTRAVENOUS | Status: AC
  Administered 2024-02-29: 500 mg via INTRAVENOUS
  Filled 2024-02-29: qty 100

## 2024-02-29 MED ORDER — KETOROLAC TROMETHAMINE 15 MG/ML IJ SOLN
15.0000 mg | Freq: Once | INTRAMUSCULAR | Status: AC
  Administered 2024-02-29: 15 mg via INTRAVENOUS
  Filled 2024-02-29: qty 1

## 2024-02-29 MED ORDER — ONDANSETRON HCL 4 MG PO TABS: 4.0000 mg | ORAL_TABLET | Freq: Four times a day (QID) | ORAL | Status: DC | PRN

## 2024-02-29 MED ORDER — OXYCODONE HCL 5 MG PO TABS
5.0000 mg | ORAL_TABLET | ORAL | Status: DC | PRN
  Administered 2024-02-29 – 2024-03-01 (×2): 5 mg via ORAL
  Filled 2024-02-29 (×3): qty 1

## 2024-02-29 MED ORDER — ACETAMINOPHEN 325 MG PO TABS: 650.0000 mg | ORAL_TABLET | Freq: Four times a day (QID) | ORAL | Status: DC | PRN

## 2024-02-29 MED ORDER — MORPHINE SULFATE (PF) 2 MG/ML IV SOLN: 2.0000 mg | INTRAVENOUS | Status: DC | PRN

## 2024-02-29 MED ORDER — CIPROFLOXACIN IN D5W 400 MG/200ML IV SOLN
400.0000 mg | Freq: Two times a day (BID) | INTRAVENOUS | Status: DC
  Administered 2024-02-29 – 2024-03-02 (×5): 400 mg via INTRAVENOUS
  Filled 2024-02-29 (×6): qty 200

## 2024-02-29 MED ORDER — SODIUM CHLORIDE 0.9 % IV SOLN: INTRAVENOUS | Status: AC

## 2024-02-29 MED ORDER — ENOXAPARIN SODIUM 40 MG/0.4ML IJ SOSY
40.0000 mg | PREFILLED_SYRINGE | INTRAMUSCULAR | Status: DC
  Administered 2024-02-29 – 2024-03-03 (×4): 40 mg via SUBCUTANEOUS
  Filled 2024-02-29 (×5): qty 0.4

## 2024-02-29 MED ORDER — IOHEXOL 300 MG/ML  SOLN
100.0000 mL | Freq: Once | INTRAMUSCULAR | Status: AC | PRN
  Administered 2024-02-29: 100 mL via INTRAVENOUS

## 2024-02-29 MED ORDER — METRONIDAZOLE 500 MG/100ML IV SOLN
500.0000 mg | Freq: Two times a day (BID) | INTRAVENOUS | Status: AC
  Administered 2024-02-29 – 2024-03-02 (×5): 500 mg via INTRAVENOUS
  Filled 2024-02-29 (×5): qty 100

## 2024-02-29 MED ORDER — ONDANSETRON HCL 4 MG/2ML IJ SOLN
4.0000 mg | Freq: Four times a day (QID) | INTRAMUSCULAR | Status: DC | PRN
  Administered 2024-03-01: 4 mg via INTRAVENOUS
  Filled 2024-02-29: qty 2

## 2024-02-29 MED ORDER — PANTOPRAZOLE SODIUM 40 MG IV SOLR
40.0000 mg | Freq: Two times a day (BID) | INTRAVENOUS | Status: DC
  Administered 2024-02-29 – 2024-03-02 (×5): 40 mg via INTRAVENOUS
  Filled 2024-02-29 (×5): qty 10

## 2024-02-29 MED ORDER — CIPROFLOXACIN IN D5W 400 MG/200ML IV SOLN
400.0000 mg | Freq: Once | INTRAVENOUS | Status: AC
  Administered 2024-02-29: 400 mg via INTRAVENOUS
  Filled 2024-02-29: qty 200

## 2024-02-29 NOTE — Consult Note (Signed)
 " Date of Consultation:  02/29/2024  Requesting Physician:  Andriette Maltos, PA-C  Reason for Consultation: Acute diverticulitis  History of Present Illness: Ashley [name] is a 59 y.o. female presenting for evaluation of persistent sigmoid diverticulitis.  The patient has prior history of episodes of acute diverticulitis which has been managed as an outpatient with oral antibiotics.  She presented to the emergency room on 02/18/2024 with a similar but worse episode of diverticulitis.  She felt the pain was more severe and spread compared to the previous episodes.  CT scan showed acute uncomplicated diverticulitis in the same area of the sigmoid colon.  She was discharged home with her usual antibiotic which is Augmentin .  However the patient presented today again to the emergency room due to lack of any improvement and in fact potential worsening of her symptoms.  Workup in the emergency room showed again a normal white blood cell count of 5.8 unchanged from her visit on 02/18/2024.  She had a CT scan of the abdomen pelvis which showed continued sigmoid diverticulitis with surrounding stranding but no evidence of abscess or perforation.  I personally viewed the images and agree with the findings.  Due to the persistent symptoms and no improvement in clinical condition, she was admitted for IV antibiotics.  Past Medical History: Past Medical History:  Diagnosis Date   Abdominal pain 06/23/2018   Acute breast pain 01/30/2022   Acute midline low back pain without sciatica 09/10/2019   Acute right flank pain 06/29/2021   Adverse effect of drug 08/15/2019   Anemia    Bronchitis    2 years ago    [facility] arthritis 2019   base of left thumb    Constipation 04/07/2019   Discoloration of skin 05/17/2021   Diverticulitis of colon    GERD (gastroesophageal reflux disease)    diet controlled , no meds   Hydrosalpinx    bilateral    Hyperlipidemia    recent dx - currently diet control - no meds    Hypertension    Hypothyroid 2003   radio active iodine - no meds   Left-sided weakness 02/12/2019   Ovarian cyst, left 02/23/2016   Urinary frequency 11/30/2018   Vitamin B 12 deficiency      Past Surgical History: Past Surgical History:  Procedure Laterality Date   CESAREAN SECTION     x 2   CHOLECYSTECTOMY  2008   COLONOSCOPY WITH PROPOFOL  N/A 08/06/2021   Procedure: COLONOSCOPY WITH PROPOFOL ;  Surgeon: Jinny Carmine, MD;  Location: [facility];  Service: Endoscopy;  Laterality: N/A;   DILATION AND CURETTAGE OF UTERUS     X 2   ESOPHAGOGASTRODUODENOSCOPY (EGD) WITH PROPOFOL  N/A 08/06/2021   Procedure: ESOPHAGOGASTRODUODENOSCOPY (EGD) WITH PROPOFOL ;  Surgeon: Jinny Carmine, MD;  Location: [facility];  Service: Endoscopy;  Laterality: N/A;   LAPAROSCOPIC BILATERAL SALPINGECTOMY Bilateral 07/29/2017   Procedure: LAPAROSCOPIC LEFT SALPINGOOPHRECTOMY: Partial right Salpingectomy;  Surgeon: Corene Coy, MD;  Location: [facility] [place];  Service: Gynecology;  Laterality: Bilateral;   POLYPECTOMY  08/06/2021   Procedure: POLYPECTOMY;  Surgeon: Jinny Carmine, MD;  Location: [facility];  Service: Endoscopy;;   SHOULDER SURGERY  2016   arthroscopic   TOTAL ABDOMINAL HYSTERECTOMY  2006   Fibroids, benign pathology   TUBAL LIGATION     WISDOM TOOTH EXTRACTION     at age 56 yr   WRIST ARTHROSCOPY Right 2007    Home Medications: Prior to Admission medications  Medication Sig Start  Date End Date Taking? Authorizing Provider  aspirin  81 MG EC tablet Take by mouth. 09/16/19   [provider]  atorvastatin  (LIPITOR) 10 MG tablet Take 1 tablet (10 mg total) by mouth daily. for cholesterol. 11/19/23   Clark, Katherine K, NP  azelastine  (ASTELIN ) 0.1 % nasal spray Place 1 spray into both nostrils 2 (two) times daily. Use in each nostril as directed 12/17/23   Corwin Antu, FNP  cetirizine  (ZYRTEC ) 10 MG tablet Take 1 tablet (10 mg total) by  mouth at bedtime. For throat drainage 11/19/23   Clark, Katherine K, NP  clindamycin (CLEOCIN T) 1 % external solution SMARTSIG:Topical 09/11/23   [provider]  famotidine  (PEPCID ) 20 MG tablet Take 1 tablet (20 mg total) by mouth 2 (two) times daily. 10/22/23   Brimage, Vondra, DO  fluticasone  (FLONASE ) 50 MCG/ACT nasal spray Place 1 spray into both nostrils 2 (two) times daily. 11/19/19   Tower, Laine LABOR, MD  gabapentin (NEURONTIN) 600 MG tablet Take 1,200 mg by mouth daily. 01/25/20   [provider]  hydrochlorothiazide  (HYDRODIURIL ) 25 MG tablet Take 1 tablet (25 mg total) by mouth daily. for blood pressure. 08/20/23   Clark, Katherine K, NP  hydrocortisone 2.5 % cream SMARTSIG:Topical 09/15/23   [provider]  ketoconazole (NIZORAL) 2 % cream SMARTSIG:1 Application Topical 1 to 2 Times Daily 09/15/23   [provider]  omeprazole  (PRILOSEC) 40 MG capsule Take 1 capsule (40 mg total) by mouth 2 (two) times daily. 12/17/23   Dugal, Tabitha, FNP  [Paused] tiZANidine  (ZANAFLEX ) 2 MG tablet Take between 1mg  (Half a tablet) and 4mg  (two tablets) up to three times daily as needed for musculoskeletal pain or muscle spasms. Wait to take this until your doctor or other care provider tells you to start again. 10/16/23   [provider]  valACYclovir  (VALTREX ) 500 MG tablet Take 1 tablet (500 mg total) by mouth daily. For suppression.  Take two tablets for 5 days in the event of an outbreak. 03/06/23   Anyanwu, Ugonna A, MD    Allergies: Allergies[1]  Social History:  reports that she has never smoked. She has never used smokeless tobacco. She reports that she does not drink alcohol and does not use drugs.   Family History: Family History  Problem Relation Age of Onset   Hypertension Mother    Ovarian cancer Mother    Other Father        unknown medical history   Hypertension Maternal Grandmother    Heart failure Maternal Grandmother    Lung cancer  Maternal Grandmother    Breast cancer Maternal Aunt    Colon cancer Neg Hx    Colon polyps Neg Hx    Rectal cancer Neg Hx    Stomach cancer Neg Hx     Review of Systems: Review of Systems  Constitutional:  Negative for chills and fever.  HENT:  Negative for hearing loss.   Respiratory:  Negative for shortness of breath.   Cardiovascular:  Negative for chest pain.  Gastrointestinal:  Positive for abdominal pain and nausea. Negative for vomiting.  Genitourinary:  Negative for dysuria.  Musculoskeletal:  Negative for myalgias.  Skin:  Negative for rash.  Neurological:  Negative for dizziness.  Psychiatric/Behavioral:  Negative for depression.     Physical Exam BP (!) 153/97 (BP Location: Left Arm)   Pulse 90   Temp 99.1 F (37.3 C) (Oral)   Resp 18   Ht 5' 6 (1.676 m)  Wt 77.1 kg   SpO2 100%   BMI 27.44 kg/m  CONSTITUTIONAL: No acute distress, well-nourished HEENT:  Normocephalic, atraumatic, extraocular motion intact. NECK: Trachea is midline, and there is no jugular venous distension. RESPIRATORY:  Normal respiratory effort without pathologic use of accessory muscles. CARDIOVASCULAR: Regular rhythm and rate. GI: The abdomen is soft, nondistended, with localized tenderness to palpation in the left lower quadrant going towards the lower pelvis.  No diffuse peritonitis.   MUSCULOSKELETAL:  Normal muscle strength and tone in all four extremities.  No peripheral edema or cyanosis. SKIN: Skin turgor is normal. There are no pathologic skin lesions.  NEUROLOGIC:  Motor and sensation is grossly normal.  Cranial nerves are grossly intact. PSYCH:  Alert and oriented to person, place and time. Affect is normal.  Laboratory Analysis: Results for orders placed or performed during the hospital encounter of 02/29/24 (from the past 24 hours)  Lipase, blood     Status: None   Collection Time: 02/29/24  7:58 AM  Result Value Ref Range   Lipase 22 11 - 51 U/L  Comprehensive metabolic  panel     Status: Abnormal   Collection Time: 02/29/24  7:58 AM  Result Value Ref Range   Sodium 138 135 - 145 mmol/L   Potassium 4.1 3.5 - 5.1 mmol/L   Chloride 101 98 - 111 mmol/L   CO2 24 22 - 32 mmol/L   Glucose, Bld 110 (H) 70 - 99 mg/dL   BUN 8 6 - 20 mg/dL   Creatinine, Ser 9.21 0.44 - 1.00 mg/dL   Calcium  10.5 (H) 8.9 - 10.3 mg/dL   Total Protein 7.9 6.5 - 8.1 g/dL   Albumin 4.3 3.5 - 5.0 g/dL   AST 22 15 - 41 U/L   ALT 12 0 - 44 U/L   Alkaline Phosphatase 103 38 - 126 U/L   Total Bilirubin 0.3 0.0 - 1.2 mg/dL   GFR, Estimated >39 >39 mL/min   Anion gap 13 5 - 15  CBC     Status: None   Collection Time: 02/29/24  7:58 AM  Result Value Ref Range   WBC 5.7 4.0 - 10.5 K/uL   RBC 4.81 3.87 - 5.11 MIL/uL   Hemoglobin 13.1 12.0 - 15.0 g/dL   HCT 59.7 63.9 - 53.9 %   MCV 83.6 80.0 - 100.0 fL   MCH 27.2 26.0 - 34.0 pg   MCHC 32.6 30.0 - 36.0 g/dL   RDW 84.8 88.4 - 84.4 %   Platelets 329 150 - 400 K/uL   nRBC 0.0 0.0 - 0.2 %  Urinalysis, Routine w reflex microscopic -Urine, Random     Status: Abnormal   Collection Time: 02/29/24  7:58 AM  Result Value Ref Range   Color, Urine YELLOW (A) YELLOW   APPearance HAZY (A) CLEAR   Specific Gravity, Urine 1.020 1.005 - 1.030   pH 5.0 5.0 - 8.0   Glucose, UA NEGATIVE NEGATIVE mg/dL   Hgb urine dipstick NEGATIVE NEGATIVE   Bilirubin Urine NEGATIVE NEGATIVE   Ketones, ur NEGATIVE NEGATIVE mg/dL   Protein, ur NEGATIVE NEGATIVE mg/dL   Nitrite NEGATIVE NEGATIVE   Leukocytes,Ua TRACE (A) NEGATIVE   RBC / HPF 0-5 0 - 5 RBC/hpf   WBC, UA 6-10 0 - 5 WBC/hpf   Bacteria, UA NONE SEEN NONE SEEN   Squamous Epithelial / HPF 6-10 0 - 5 /HPF   Mucus PRESENT   CBC with Differential/Platelet     Status: None  Collection Time: 02/29/24  7:58 AM  Result Value Ref Range   WBC 5.8 4.0 - 10.5 K/uL   RBC 4.74 3.87 - 5.11 MIL/uL   Hemoglobin 12.8 12.0 - 15.0 g/dL   HCT 60.3 63.9 - 53.9 %   MCV 83.5 80.0 - 100.0 fL   MCH 27.0 26.0 - 34.0 pg    MCHC 32.3 30.0 - 36.0 g/dL   RDW 84.8 88.4 - 84.4 %   Platelets 336 150 - 400 K/uL   nRBC 0.0 0.0 - 0.2 %   Neutrophils Relative % 67 %   Neutro Abs 3.9 1.7 - 7.7 K/uL   Lymphocytes Relative 24 %   Lymphs Abs 1.4 0.7 - 4.0 K/uL   Monocytes Relative 7 %   Monocytes Absolute 0.4 0.1 - 1.0 K/uL   Eosinophils Relative 1 %   Eosinophils Absolute 0.0 0.0 - 0.5 K/uL   Basophils Relative 1 %   Basophils Absolute 0.0 0.0 - 0.1 K/uL   Immature Granulocytes 0 %   Abs Immature Granulocytes 0.01 0.00 - 0.07 K/uL    Imaging: CT ABDOMEN PELVIS W CONTRAST Result Date: 02/29/2024 EXAM: CT ABDOMEN AND PELVIS WITH CONTRAST 02/29/2024 09:01:24 AM TECHNIQUE: CT of the abdomen and pelvis was performed with the administration of 100 mL of iohexol  (OMNIPAQUE ) 300 MG/ML solution. Multiplanar reformatted images are provided for review. Automated exposure control, iterative reconstruction, and/or weight-based adjustment of the mA/kV was utilized to reduce the radiation dose to as low as reasonably achievable. COMPARISON: CT abdomen and pelvis 02/18/2024 CLINICAL HISTORY: Diverticulitis, complication suspected; LLQ abdominal pain. FINDINGS: LOWER CHEST: No acute abnormality. LIVER: The liver is unremarkable. GALLBLADDER AND BILE DUCTS: Status post cholecystectomy. No biliary ductal dilatation. SPLEEN: No acute abnormality. PANCREAS: No acute abnormality. ADRENAL GLANDS: No acute abnormality. KIDNEYS, URETERS AND BLADDER: No stones in the kidneys or ureters. No hydronephrosis. No perinephric or periureteral stranding. Urinary bladder is unremarkable. No filling defects of the partially visualized collecting systems on delayed imaging. GI AND BOWEL: Stomach demonstrates no acute abnormality. No small or large bowel thickening or dilatation. Colonic diverticulosis. Focal mid sigmoid colon bowel thickening and pericolonic fat stranding no definite intramural hematoma formation. The appendix is unremarkable. PERITONEUM AND  RETROPERITONEUM: Trace free fluid within the pelvis. No free air. No intraperitoneal organized fluid collection. VASCULATURE: Aorta is normal in caliber. LYMPH NODES: No lymphadenopathy. REPRODUCTIVE ORGANS: Status post hysterectomy. No adnexal mass. BONES AND SOFT TISSUES: No acute osseous abnormality. Tiny fat-containing umbilical hernia. IMPRESSION: 1. Colonic diverticulosis with uncomplicated acute mid sigmoid diverticulitis. Recommend colonoscopy status post treatment and status post complete resolution of inflammatory changes to exclude an underlying lesion. 2. Status post cholecystectomy and hysterectomy . Electronically signed by: Morgane Naveau MD 02/29/2024 09:06 AM EST RP Workstation: [id number]    Assessment and Plan: This is a 59 y.o. female with persistent uncomplicated diverticulitis.  - Discussed with patient the findings on her CT scan from today.  In comparison to her CT scan on 02/18/2024, there is no significant difference but there is still persistent inflammatory changes in the sigmoid colon.  There is no perforation or abscess noted.  Discussed with patient that it may be that because of her prior episodes of diverticulitis using the same antibiotic, that this episode is more resistant.  As such, I think switching her to a different course with ciprofloxacin  and Flagyl  could be more beneficial.  Discussed with her the potential for repeating CT scan if there was any worsening of her symptoms.  Discussed with her that for now we will keep her n.p.o. with IV fluid hydration.  However if she were to improve clinically tomorrow we would advance her diet. - Discussed with patient the recommendation for eventual elective sigmoidectomy given that she has been having multiple episodes of diverticulitis and potentially this episode may be more severe or worse compared to the priors. - Will continue to follow with you.  I spent 60 minutes dedicated to the care of this patient on the date of  this encounter to include pre-visit review of records, face-to-face time with the patient discussing diagnosis and management, and any post-visit coordination of care.   Aloysius Sheree Plant, MD  [facility]:  [phone]      [1]  Allergies Allergen Reactions   Aczone [Dapsone] Swelling   Levaquin  [Levofloxacin ]     Presumed cause of joint aches   "

## 2024-02-29 NOTE — H&P (Signed)
 "  History and Physical    [name] FMW:[id number] DOB: 02-10-1966 DOA: 02/29/2024  DOS: the patient was seen and examined on 02/29/2024  PCP: Gretta Comer POUR, NP   Patient coming from: Home  I have personally briefly reviewed patient's old medical records in [facility]  Chief Complaint: Abdominal pain since Christmas eve  HPI: Ashley [name] is a pleasant 58 y.o. female with medical history significant for prior history of diverticulitis 6 months ago, HTN, HLD, GERD presented to ED with left lower quadrant pain.  Patient was diagnosed with diverticulitis on 02/18/2024.  She was given Augmentin  and she finished a course of 10-day Augmentin  yesterday.  Patient stated that her pain rather worsened with cramping sensation with nausea and vomiting.  Patient rated her pain to 6-7/10 in intensity, constant, did not get better.  She had a last bowel movement this morning.  She denies any blood in it.  She denies any fever, chills complains some nausea but no vomiting or diarrhea.  She denies any chest pain cough, palpitations, dysuria or hematuria.  ED Course: Upon arrival to the ED, patient is found to be tachycardic at 110 left lower quadrant tenderness, urine analysis showed trace leukocytes.  CT scan of the abdomen showed persistent sigmoid diverticulitis.  Patient was started on IV fluid, Cipro  and Flagyl  and Toradol .  General surgery was consulted who advised to admit the patient under medical service and they will see the patient.  Hospital service was consulted for evaluation for admission for acute diverticulitis failed outpatient treatment.  Review of Systems:  ROS  All other systems negative except as noted in the HPI.  Past Medical History:  Diagnosis Date   Abdominal pain 06/23/2018   Acute breast pain 01/30/2022   Acute midline low back pain without sciatica 09/10/2019   Acute right flank pain 06/29/2021   Adverse effect of drug 08/15/2019   Anemia     Bronchitis    2 years ago    [facility] arthritis 2019   base of left thumb    Constipation 04/07/2019   Discoloration of skin 05/17/2021   Diverticulitis of colon    GERD (gastroesophageal reflux disease)    diet controlled , no meds   Hydrosalpinx    bilateral    Hyperlipidemia    recent dx - currently diet control - no meds   Hypertension    Hypothyroid 2003   radio active iodine - no meds   Left-sided weakness 02/12/2019   Ovarian cyst, left 02/23/2016   Urinary frequency 11/30/2018   Vitamin B 12 deficiency     Past Surgical History:  Procedure Laterality Date   CESAREAN SECTION     x 2   CHOLECYSTECTOMY  2008   COLONOSCOPY WITH PROPOFOL  N/A 08/06/2021   Procedure: COLONOSCOPY WITH PROPOFOL ;  Surgeon: Jinny Carmine, MD;  Location: [facility];  Service: Endoscopy;  Laterality: N/A;   DILATION AND CURETTAGE OF UTERUS     X 2   ESOPHAGOGASTRODUODENOSCOPY (EGD) WITH PROPOFOL  N/A 08/06/2021   Procedure: ESOPHAGOGASTRODUODENOSCOPY (EGD) WITH PROPOFOL ;  Surgeon: Jinny Carmine, MD;  Location: [facility];  Service: Endoscopy;  Laterality: N/A;   LAPAROSCOPIC BILATERAL SALPINGECTOMY Bilateral 07/29/2017   Procedure: LAPAROSCOPIC LEFT SALPINGOOPHRECTOMY: Partial right Salpingectomy;  Surgeon: Corene Coy, MD;  Location[facility] ;  Service: Gynecology;  Laterality: Bilateral;   POLYPECTOMY  08/06/2021   Procedure: POLYPECTOMY;  Surgeon: Jinny Carmine, MD;  Location: [facility];  Service: Endoscopy;;  SHOULDER SURGERY  2016   arthroscopic   TOTAL ABDOMINAL HYSTERECTOMY  2006   Fibroids, benign pathology   TUBAL LIGATION     WISDOM TOOTH EXTRACTION     at age 19 yr   WRIST ARTHROSCOPY Right 2007     reports that she has never smoked. She has never used smokeless tobacco. She reports that she does not drink alcohol and does not use drugs.  Allergies[1]  Family History  Problem Relation Age of Onset   Hypertension Mother     Ovarian cancer Mother    Other Father        unknown medical history   Hypertension Maternal Grandmother    Heart failure Maternal Grandmother    Lung cancer Maternal Grandmother    Breast cancer Maternal Aunt    Colon cancer Neg Hx    Colon polyps Neg Hx    Rectal cancer Neg Hx    Stomach cancer Neg Hx     Prior to Admission medications  Medication Sig Start Date End Date Taking? Authorizing Provider  aspirin  81 MG EC tablet Take by mouth. 09/16/19   [provider]  atorvastatin  (LIPITOR) 10 MG tablet Take 1 tablet (10 mg total) by mouth daily. for cholesterol. 11/19/23   Clark, Katherine K, NP  azelastine  (ASTELIN ) 0.1 % nasal spray Place 1 spray into both nostrils 2 (two) times daily. Use in each nostril as directed 12/17/23   Corwin Antu, FNP  cetirizine  (ZYRTEC ) 10 MG tablet Take 1 tablet (10 mg total) by mouth at bedtime. For throat drainage 11/19/23   Clark, Katherine K, NP  clindamycin (CLEOCIN T) 1 % external solution SMARTSIG:Topical 09/11/23   [provider]  famotidine  (PEPCID ) 20 MG tablet Take 1 tablet (20 mg total) by mouth 2 (two) times daily. 10/22/23   Brimage, Vondra, DO  fluticasone  (FLONASE ) 50 MCG/ACT nasal spray Place 1 spray into both nostrils 2 (two) times daily. 11/19/19   Tower, Laine LABOR, MD  gabapentin (NEURONTIN) 600 MG tablet Take 1,200 mg by mouth daily. 01/25/20   [provider]  hydrochlorothiazide  (HYDRODIURIL ) 25 MG tablet Take 1 tablet (25 mg total) by mouth daily. for blood pressure. 08/20/23   Clark, Katherine K, NP  hydrocortisone 2.5 % cream SMARTSIG:Topical 09/15/23   [provider]  ketoconazole (NIZORAL) 2 % cream SMARTSIG:1 Application Topical 1 to 2 Times Daily 09/15/23   [provider]  omeprazole  (PRILOSEC) 40 MG capsule Take 1 capsule (40 mg total) by mouth 2 (two) times daily. 12/17/23   Dugal, Tabitha, FNP  [Paused] tiZANidine  (ZANAFLEX ) 2 MG tablet Take between 1mg  (Half a tablet) and 4mg  (two  tablets) up to three times daily as needed for musculoskeletal pain or muscle spasms. Wait to take this until your doctor or other care provider tells you to start again. 10/16/23   [provider]  valACYclovir  (VALTREX ) 500 MG tablet Take 1 tablet (500 mg total) by mouth daily. For suppression.  Take two tablets for 5 days in the event of an outbreak. 03/06/23   Herchel Gloris LABOR, MD    Physical Exam: Vitals:   02/29/24 0756 02/29/24 0941  BP: (!) 137/96 130/88  Pulse: (!) 110 100  Resp: 19 18  Temp: 98.9 F (37.2 C)   SpO2: 95% 96%  Weight: 77.1 kg   Height: 5' 6 (1.676 m)     Physical Exam   Constitutional: Alert, awake, calm, comfortable HEENT: Neck supple Respiratory: Clear to auscultation B/L, no wheezing, no  rales.  Cardiovascular: Regular rate and rhythm, no murmurs / rubs / gallops. No extremity edema. 2+ pedal pulses. No carotid bruits.  Abdomen: Soft, left lower quadrant tenderness, no rebound. Musculoskeletal: no clubbing / cyanosis. Good ROM, no contractures. Normal muscle tone.  Skin: no rashes, lesions, ulcers. Neurologic: CN 2-12 grossly intact. Sensation intact, No focal deficit identified Psychiatric: Alert and oriented x 3. Normal mood.    Labs on Admission: I have personally reviewed following labs and imaging studies  CBC: Recent Labs  Lab 02/29/24 0758  WBC 5.8  5.7  NEUTROABS 3.9  HGB 12.8  13.1  HCT 39.6  40.2  MCV 83.5  83.6  PLT 336  329   Basic Metabolic Panel: Recent Labs  Lab 02/29/24 0758  NA 138  K 4.1  CL 101  CO2 24  GLUCOSE 110*  BUN 8  CREATININE 0.78  CALCIUM  10.5*   GFR: Estimated Creatinine Clearance: 79.4 mL/min (by C-G formula based on SCr of 0.78 mg/dL). Liver Function Tests: Recent Labs  Lab 02/29/24 0758  AST 22  ALT 12  ALKPHOS 103  BILITOT 0.3  PROT 7.9  ALBUMIN 4.3   Recent Labs  Lab 02/29/24 0758  LIPASE 22   No results for input(s): AMMONIA in the last 168 hours. Coagulation  Profile: No results for input(s): INR, PROTIME in the last 168 hours. Cardiac Enzymes: No results for input(s): CKTOTAL, CKMB, CKMBINDEX, TROPONINI, TROPONINIHS in the last 168 hours. BNP (last 3 results) No results for input(s): BNP in the last 8760 hours. HbA1C: No results for input(s): HGBA1C in the last 72 hours. CBG: No results for input(s): GLUCAP in the last 168 hours. Lipid Profile: No results for input(s): CHOL, HDL, LDLCALC, TRIG, CHOLHDL, LDLDIRECT in the last 72 hours. Thyroid  Function Tests: No results for input(s): TSH, T4TOTAL, FREET4, T3FREE, THYROIDAB in the last 72 hours. Anemia Panel: No results for input(s): VITAMINB12, FOLATE, FERRITIN, TIBC, IRON, RETICCTPCT in the last 72 hours. Urine analysis:    Component Value Date/Time   COLORURINE YELLOW (A) 02/29/2024 0758   APPEARANCEUR HAZY (A) 02/29/2024 0758   LABSPEC 1.020 02/29/2024 0758   PHURINE 5.0 02/29/2024 0758   GLUCOSEU NEGATIVE 02/29/2024 0758   GLUCOSEU NEGATIVE 11/26/2023 0809   HGBUR NEGATIVE 02/29/2024 0758   BILIRUBINUR NEGATIVE 02/29/2024 0758   BILIRUBINUR neg 11/19/2023 1153   KETONESUR NEGATIVE 02/29/2024 0758   PROTEINUR NEGATIVE 02/29/2024 0758   UROBILINOGEN 0.2 11/26/2023 0809   NITRITE NEGATIVE 02/29/2024 0758   LEUKOCYTESUR TRACE (A) 02/29/2024 0758    Radiological Exams on Admission: I have personally reviewed images CT ABDOMEN PELVIS W CONTRAST Result Date: 02/29/2024 EXAM: CT ABDOMEN AND PELVIS WITH CONTRAST 02/29/2024 09:01:24 AM TECHNIQUE: CT of the abdomen and pelvis was performed with the administration of 100 mL of iohexol  (OMNIPAQUE ) 300 MG/ML solution. Multiplanar reformatted images are provided for review. Automated exposure control, iterative reconstruction, and/or weight-based adjustment of the mA/kV was utilized to reduce the radiation dose to as low as reasonably achievable. COMPARISON: CT abdomen and pelvis 02/18/2024  CLINICAL HISTORY: Diverticulitis, complication suspected; LLQ abdominal pain. FINDINGS: LOWER CHEST: No acute abnormality. LIVER: The liver is unremarkable. GALLBLADDER AND BILE DUCTS: Status post cholecystectomy. No biliary ductal dilatation. SPLEEN: No acute abnormality. PANCREAS: No acute abnormality. ADRENAL GLANDS: No acute abnormality. KIDNEYS, URETERS AND BLADDER: No stones in the kidneys or ureters. No hydronephrosis. No perinephric or periureteral stranding. Urinary bladder is unremarkable. No filling defects of the partially visualized collecting systems on delayed imaging. GI  AND BOWEL: Stomach demonstrates no acute abnormality. No small or large bowel thickening or dilatation. Colonic diverticulosis. Focal mid sigmoid colon bowel thickening and pericolonic fat stranding no definite intramural hematoma formation. The appendix is unremarkable. PERITONEUM AND RETROPERITONEUM: Trace free fluid within the pelvis. No free air. No intraperitoneal organized fluid collection. VASCULATURE: Aorta is normal in caliber. LYMPH NODES: No lymphadenopathy. REPRODUCTIVE ORGANS: Status post hysterectomy. No adnexal mass. BONES AND SOFT TISSUES: No acute osseous abnormality. Tiny fat-containing umbilical hernia. IMPRESSION: 1. Colonic diverticulosis with uncomplicated acute mid sigmoid diverticulitis. Recommend colonoscopy status post treatment and status post complete resolution of inflammatory changes to exclude an underlying lesion. 2. Status post cholecystectomy and hysterectomy . Electronically signed by: Morgane Naveau MD 02/29/2024 09:06 AM EST RP Workstation: [id number]    EKG: N/A    Assessment/Plan Principal Problem:   Acute diverticulitis Active Problems:   Hyperlipidemia   GERD (gastroesophageal reflux disease)   HTN (hypertension)    Assessment and Plan: 59 year old female with history of prior diverticulitis 36-month ago, HTN, HLD, GERD who came in for persistent abdominal pain after 10 days  course of Augmentin  for uncomplicated diverticulitis.  1.  Acute diverticulitis failed outpatient treatment with Augmentin  - She will be admitted to the hospital as inpatient due to failed outpatient treatment and concerning for worsening. - She will be n.p.o., IV fluid, pain medications, Cipro  and Flagyl  IV for surgical team's recommendation. - Will continue to monitor her pain. - Will continue to follow-up surgical team recommendation.  2.  HTN/HLD - She is n.p.o. - I will place her on hydralazine  as needed for systolic blood pressure more than 160 and other medication will be held due to NPO.  3.  GERD - She takes omeprazole  at home and will be changed to Protonix  IV twice daily.      DVT prophylaxis: Lovenox  Code Status: Full Code Family Communication: None Disposition Plan: Home Consults called: General Surgery by ED provider Admission status: Inpatient, Med-Surg   Nena Rebel, MD [facility] 02/29/2024, 9:45 AM        [1]  Allergies Allergen Reactions   Aczone [Dapsone] Swelling   Levaquin  [Levofloxacin ]     Presumed cause of joint aches   "

## 2024-02-29 NOTE — ED Provider Notes (Signed)
 "  [facility] Provider Note    Event Date/Time   First MD Initiated Contact with Patient 02/29/24 0815     (approximate)   History   Abdominal Pain   HPI  Ashley [name] is a 59 y.o. female who presents today for evaluation of left lower quadrant pain.  Patient reports that she was seen here on 02/18/2024 and diagnosed with diverticulitis.  She reports that she has been taking her Augmentin  as prescribed and finish the course yesterday but reports that her pain has continued to worsen.  She reports that she has sharp abdominal cramping with nausea.  No fevers or chills.  No bloody stool or change in stool caliber.  She reports that usually when she gets bouts of diverticulitis her pain improves after approximately 4 days of antibiotics, and she is concerned that her pain is continuing to worsen.  Patient Active Problem List   Diagnosis Date Noted   Acute diverticulitis 02/29/2024   Spondylolisthesis at L4-L5 level 09/30/2023   Positive ANA (antinuclear antibody) 08/06/2023   Scalp itch 08/01/2023   Facial pain 08/01/2023   Chronic hip pain, right 05/30/2023   Upper extremity pain, anterior, left 12/12/2022   Screening examination for STI 11/07/2022   Frequent headaches 06/11/2022   Chronic pain of right knee 01/30/2022   TMJ (temporomandibular joint syndrome) 09/07/2021   Polyp of sigmoid colon    Left lower quadrant abdominal pain 06/29/2021   History of diverticulitis 06/29/2021   Adjustment reaction with anxiety and depression 05/09/2021   Osteoarthritis of carpometacarpal [facility] joint of thumb 04/09/2021   Pain of right thumb 04/09/2021   Post-viral cough syndrome 10/04/2020   Palpitations 10/04/2020   Prediabetes 06/15/2020   Chronic hand pain 05/10/2020   Neuropathy 12/13/2019   Lumbar stenosis without neurogenic claudication 12/09/2019   Cervical radiculitis 11/19/2019   Foraminal stenosis of cervical region 11/19/2019   Dizziness  10/07/2019   Chronic bilateral low back pain 09/10/2019   HSV (herpes simplex virus) anogenital infection 06/25/2019   Chronic neck pain 02/17/2019   HTN (hypertension)    History of TIA (transient ischemic attack)    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   Numbness and tingling 02/01/2019   Urinary frequency 11/30/2018   Ganglion cyst of tendon sheath of left hand 11/13/2018   Acquired trigger finger 10/26/2018   Osteoarthritis 06/30/2018   Vitamin D  deficiency 06/23/2018   Vitamin B 12 deficiency 06/23/2018   Generalized abdominal pain 06/23/2018   GERD (gastroesophageal reflux disease) 01/30/2018   Hyperlipidemia 03/19/2017   Allergic rhinitis 03/19/2017   Injury of right shoulder 04/18/2015   Impingement syndrome of left shoulder 12/27/2014   Impingement syndrome of right shoulder 12/27/2014   Chronic right shoulder pain 11/29/2014          Physical Exam   Triage Vital Signs: ED Triage Vitals [02/29/24 0756]  Encounter Vitals Group     BP (!) 137/96     Girls Systolic BP Percentile      Girls Diastolic BP Percentile      Boys Systolic BP Percentile      Boys Diastolic BP Percentile      Pulse Rate (!) 110     Resp 19     Temp 98.9 F (37.2 C)     Temp src      SpO2 95 %     Weight 170 lb (77.1 kg)     Height 5' 6 (1.676 m)     Head  Circumference      Peak Flow      Pain Score 8     Pain Loc      Pain Education      Exclude from Growth Chart     Most recent vital signs: Vitals:   02/29/24 0941 02/29/24 0958  BP: 130/88 (!) 153/97  Pulse: 100 90  Resp: 18 18  Temp:  99.1 F (37.3 C)  SpO2: 96% 100%    Physical Exam Vitals and nursing note reviewed.  Constitutional:      General: Awake and alert. No acute distress.    Appearance: Normal appearance. The patient is normal weight.  HENT:     Head: Normocephalic and atraumatic.     Mouth: Mucous membranes are moist.  Eyes:     General: PERRL. Normal EOMs        Right eye: No discharge.        Left  eye: No discharge.     Conjunctiva/sclera: Conjunctivae normal.  Cardiovascular:     Rate and Rhythm: Tachycardic rate and regular rhythm.     Pulses: Normal pulses.  Pulmonary:     Effort: Pulmonary effort is normal. No respiratory distress.     Breath sounds: Normal breath sounds.  Abdominal:     Abdomen is soft. There is suprapubic and left lower quadrant abdominal tenderness. No rebound or guarding. No distention. Musculoskeletal:        General: No swelling. Normal range of motion.     Cervical back: Normal range of motion and neck supple.  Skin:    General: Skin is warm and dry.     Capillary Refill: Capillary refill takes less than 2 seconds.     Findings: No rash.  Neurological:     Mental Status: The patient is awake and alert.      ED Results / Procedures / Treatments   Labs (all labs ordered are listed, but only abnormal results are displayed) Labs Reviewed  COMPREHENSIVE METABOLIC PANEL WITH GFR - Abnormal; Notable for the following components:      Result Value   Glucose, Bld 110 (*)    Calcium  10.5 (*)    All other components within normal limits  URINALYSIS, ROUTINE W REFLEX MICROSCOPIC - Abnormal; Notable for the following components:   Color, Urine YELLOW (*)    APPearance HAZY (*)    Leukocytes,Ua TRACE (*)    All other components within normal limits  LIPASE, BLOOD  CBC  CBC WITH DIFFERENTIAL/PLATELET  CBC  HIV ANTIBODY (ROUTINE TESTING W REFLEX)  CREATININE, SERUM     EKG     RADIOLOGY I independently reviewed and interpreted imaging and agree with radiologists findings.     PROCEDURES:  Critical Care performed:   Procedures   MEDICATIONS ORDERED IN ED: Medications  enoxaparin  (LOVENOX ) injection 40 mg (40 mg Subcutaneous Given 02/29/24 1047)  0.9 %  sodium chloride  infusion ( Intravenous New Bag/Given 02/29/24 1011)  acetaminophen  (TYLENOL ) tablet 650 mg (has no administration in time range)    Or  acetaminophen  (TYLENOL )  suppository 650 mg (has no administration in time range)  oxyCODONE  (Oxy IR/ROXICODONE ) immediate release tablet 5 mg (has no administration in time range)  morphine  (PF) 2 MG/ML injection 2 mg (has no administration in time range)  ondansetron  (ZOFRAN ) tablet 4 mg (has no administration in time range)    Or  ondansetron  (ZOFRAN ) injection 4 mg (has no administration in time range)  pantoprazole  (PROTONIX ) injection 40 mg (40 mg  Intravenous Given 02/29/24 1047)  ciprofloxacin  (CIPRO ) IVPB 400 mg (has no administration in time range)  metroNIDAZOLE  (FLAGYL ) IVPB 500 mg (has no administration in time range)  hydrALAZINE  (APRESOLINE ) injection 10 mg (has no administration in time range)  iohexol  (OMNIPAQUE ) 300 MG/ML solution 100 mL (100 mLs Intravenous Contrast Given 02/29/24 0855)  ketorolac  (TORADOL ) 15 MG/ML injection 15 mg (15 mg Intravenous Given 02/29/24 0916)  ciprofloxacin  (CIPRO ) IVPB 400 mg (0 mg Intravenous Stopped 02/29/24 1047)  metroNIDAZOLE  (FLAGYL ) IVPB 500 mg (500 mg Intravenous New Bag/Given 02/29/24 1013)     IMPRESSION / MDM / ASSESSMENT AND PLAN / ED COURSE  I reviewed the triage vital signs and the nursing notes.   Differential diagnosis includes, but is not limited to, complicated diverticulitis, perforation, abscess, continued diverticulitis.  Patient is awake and alert, tachycardic to 110 though normotensive and afebrile.  Her abdomen is soft, though tender in her suprapubic and left lower quadrant abdominal area.  Further workup is indicated.  Labs are obtained which are overall reassuring including no leukocytosis.  CT scan was repeated for evaluation of possible complicated diverticulitis given her worsening symptoms and known diagnosis of diverticulitis.  CT reveals continued uncomplicated acute sigmoid diverticulitis.  Patient has a listed allergy to Levaquin .  I consulted Dr. Desiderio for further management given her presumed failed outpatient antibiotics.  He recommends  IV ciprofloxacin  and IV Flagyl  and admission to the hospitalist service.  Patient is in agreement with this plan.  I consulted the hospitalist for admission and patient was accepted by Dr. Paudel.   Patient's presentation is most consistent with acute presentation with potential threat to life or bodily function.   Clinical Course as of 02/29/24 1143  [place] Feb 29, 2024  0911 Dr. Desiderio paged [JP]  [phone] I consulted Dr. Desiderio given failure of oral antibiotics.  He recommend switching to Cipro /Flagyl  IV and admit to hospitalist.  Patient is in agreement with this. [JP]    Clinical Course User Index [JP] Aedan Geimer E, PA-C     FINAL CLINICAL IMPRESSION(S) / ED DIAGNOSES   Final diagnoses:  Diverticulitis     Rx / DC Orders   ED Discharge Orders     None        Note:  This document was prepared using Dragon voice recognition software and may include unintentional dictation errors.   Cheyna Retana E, PA-C 02/29/24 1143  "

## 2024-02-29 NOTE — Progress Notes (Signed)
" °   02/29/24 1300  Spiritual Encounters  Type of Visit Follow up  Care provided to: Patient  Reason for visit Advance directives  OnCall Visit Yes   Information for HCPOA explained. Patient knows to contact Chaplain to have forms notarized.  "

## 2024-02-29 NOTE — Progress Notes (Signed)
" °   02/29/24 1100  Spiritual Encounters  Type of Visit Initial  Care provided to: Pt not available  Referral source Nurse (RN/NT/LPN)  Reason for visit Advance directives  OnCall Visit Yes   Chaplain responded to [place] consult to provide AD information. Patient receiving care at time of visit. Chaplain will follow up.  "

## 2024-02-29 NOTE — ED Triage Notes (Signed)
 Pt comes with c/o belly pain for last two weeks. Pt  states nausea. Pt states she was seen here on the 24th for the same. Pt was prescribed meds and took the whole course.

## 2024-02-29 NOTE — ED Notes (Signed)
 See triage note  Presents with abd pian  States this started about 1 month ago  Was seen and dx'd with diverticulitis  Placed on meds  But states the pain has continued  Afebrile on arrival

## 2024-03-01 DIAGNOSIS — K5732 Diverticulitis of large intestine without perforation or abscess without bleeding: Secondary | ICD-10-CM | POA: Diagnosis not present

## 2024-03-01 LAB — COMPREHENSIVE METABOLIC PANEL WITH GFR
ALT: 12 U/L (ref 0–44)
AST: 22 U/L (ref 15–41)
Albumin: 3.6 g/dL (ref 3.5–5.0)
Alkaline Phosphatase: 84 U/L (ref 38–126)
Anion gap: 9 (ref 5–15)
BUN: 6 mg/dL (ref 6–20)
CO2: 24 mmol/L (ref 22–32)
Calcium: 9.5 mg/dL (ref 8.9–10.3)
Chloride: 107 mmol/L (ref 98–111)
Creatinine, Ser: 0.72 mg/dL (ref 0.44–1.00)
GFR, Estimated: >60 mL/min
Glucose, Bld: 84 mg/dL (ref 70–99)
Potassium: 4.2 mmol/L (ref 3.5–5.1)
Sodium: 140 mmol/L (ref 135–145)
Total Bilirubin: 0.4 mg/dL (ref 0.0–1.2)
Total Protein: 6.5 g/dL (ref 6.5–8.1)

## 2024-03-01 LAB — CBC
HCT: 34.8 % — ABNORMAL LOW (ref 36.0–46.0)
Hemoglobin: 11.5 g/dL — ABNORMAL LOW (ref 12.0–15.0)
MCH: 27.6 pg (ref 26.0–34.0)
MCHC: 33 g/dL (ref 30.0–36.0)
MCV: 83.7 fL (ref 80.0–100.0)
Platelets: 291 K/uL (ref 150–400)
RBC: 4.16 MIL/uL (ref 3.87–5.11)
RDW: 15 % (ref 11.5–15.5)
WBC: 3.6 K/uL — ABNORMAL LOW (ref 4.0–10.5)
nRBC: 0 % (ref 0.0–0.2)

## 2024-03-01 LAB — PROTIME-INR
INR: 1.1 (ref 0.8–1.2)
Prothrombin Time: 14.4 s (ref 11.4–15.2)

## 2024-03-01 NOTE — TOC CM/SW Note (Signed)
 Transition of Care [facility]) - Inpatient Brief Assessment   Patient Details  Name: Ashley [name] MRN: [id number] Date of Birth: Apr 13, 1965  [facility] of [facility]) CM/SW Contact:    Corean ONEIDA Haddock, RN Phone Number: 03/01/2024, 9:58 AM   Clinical Narrative: Transition of Care Department [facility]) has reviewed patient and no TOC needs have been identified at this time.  If new patient transition needs arise, please place a TOC consult.    Transition of Care Asessment: Insurance and Status: Insurance coverage has been reviewed Patient has primary care physician: Yes     Prior/Current Home Services: No current home services Social Drivers of Health Review: SDOH reviewed no interventions necessary Readmission risk has been reviewed: Yes Transition of care needs: no transition of care needs at this time

## 2024-03-01 NOTE — Progress Notes (Signed)
 [facility] SURGICAL PROGRESS NOTE (cpt [phone])  Hospital Day(s): 1.   Interval History: Patient seen and examined, no acute events or new complaints overnight. Patient reports she is feeling better compared to presentation. She does report more localized discomfort in her suprapubic region. No fever, chills, nausea, emesis. WBC 3.6K, Hgb to 11.5. CMP is reassuring. She is on Rocephin /Flagyl . Remains NPO.   Review of Systems:  Constitutional: denies fever, chills  HEENT: denies cough or congestion  Respiratory: denies any shortness of breath  Cardiovascular: denies chest pain or palpitations  Gastrointestinal: + abdominal pain, denied nausea, emesis  Genitourinary: denies burning with urination or urinary frequency Musculoskeletal: denies pain, decreased motor or sensation  Vital signs in last 24 hours: [min-max] current  Temp:  [98 F (36.7 C)-99.1 F (37.3 C)] 98.4 F (36.9 C) (01/05 0358) Pulse Rate:  [69-110] 73 (01/05 0358) Resp:  [17-19] 18 (01/05 0358) BP: (121-153)/(77-97) 121/77 (01/05 0358) SpO2:  [95 %-100 %] 97 % (01/05 0358) Weight:  [77.1 kg] 77.1 kg (01/04 0756)     Height: 5' 6 (167.6 cm) Weight: 77.1 kg BMI (Calculated): 27.45   Intake/Output last 2 shifts:  01/04 0701 - 01/05 0700 In: 2198.6 [I.V.:1898.6; IV Piggyback:300] Out: -    Physical Exam:  Constitutional: alert, cooperative and no distress  HENT: normocephalic without obvious abnormality  Respiratory: breathing non-labored at rest  Cardiovascular: regular rate and sinus rhythm  Gastrointestinal: soft, she remains tender in suprapubic region, and non-distended, no rebound/guarding   Labs:     Latest Ref Rng & Units 03/01/2024    4:43 AM 02/29/2024   11:00 AM 02/29/2024    7:58 AM  CBC  WBC 4.0 - 10.5 K/uL 3.6  5.7  5.7    5.8   Hemoglobin 12.0 - 15.0 g/dL 88.4  87.0  86.8    87.1   Hematocrit 36.0 - 46.0 % 34.8  38.9  40.2    39.6   Platelets 150 - 400 K/uL 291  330  329    336        Latest Ref Rng & Units 03/01/2024    4:43 AM 02/29/2024   11:00 AM 02/29/2024    7:58 AM  CMP  Glucose 70 - 99 mg/dL 84   889   BUN 6 - 20 mg/dL 6   8   Creatinine 9.55 - 1.00 mg/dL 9.27  9.27  9.21   Sodium 135 - 145 mmol/L 140   138   Potassium 3.5 - 5.1 mmol/L 4.2   4.1   Chloride 98 - 111 mmol/L 107   101   CO2 22 - 32 mmol/L 24   24   Calcium  8.9 - 10.3 mg/dL 9.5   89.4   Total Protein 6.5 - 8.1 g/dL 6.5   7.9   Total Bilirubin 0.0 - 1.2 mg/dL 0.4   0.3   Alkaline Phos 38 - 126 U/L 84   103   AST 15 - 41 U/L 22   22   ALT 0 - 44 U/L 12   12     Imaging studies: No new pertinent imaging studies   Assessment/Plan:  59 y.o. female with likely persistent diverticulitis without abscess or perforation   - Given continued pain, we will continue NPO for now. Okay for sips, ice chips  - Continue IV Abx (Cipro , Flagyl )   - Monitor abdominal examination   - No need for emergent surgical intervention. She understands if she fails to improve,  or deteriorates, we would need to consider surgical intervention and likely temporizing ostomy.   - If no improvement tomorrow (01/06), may need to consider repeating CT - Pain control prn; antiemetics prn - Okay to mobilize - Further management per primary service; we will follow along    All of the above findings and recommendations were discussed with the patient, and the medical team, and all of patient's questions were answered to her expressed satisfaction.  -- Arthea Platt, PA-C [facility] 03/01/2024, 7:31 AM M-F: 7am - 4pm

## 2024-03-01 NOTE — Progress Notes (Signed)
 " Progress Note   Patient: Ashley [name] FMW:[id number] DOB: 12/26/1965 DOA: 02/29/2024     1 DOS: the patient was seen and examined on 03/01/2024   Brief hospital course: From HPI Ashley [name] is a pleasant 59 y.o. female with medical history significant for prior history of diverticulitis 6 months ago, HTN, HLD, GERD presented to ED with left lower quadrant pain.  Patient was diagnosed with diverticulitis on 02/18/2024.  She was given Augmentin  and she finished a course of 10-day Augmentin  yesterday.  Patient stated that her pain rather worsened with cramping sensation with nausea and vomiting.  Patient rated her pain to 6-7/10 in intensity, constant, did not get better.  She had a last bowel movement this morning.  She denies any blood in it.  She denies any fever, chills complains some nausea but no vomiting or diarrhea.  She denies any chest pain cough, palpitations, dysuria or hematuria.   ED Course: Upon arrival to the ED, patient is found to be tachycardic at 110 left lower quadrant tenderness, urine analysis showed trace leukocytes.  CT scan of the abdomen showed persistent sigmoid diverticulitis.  Patient was started on IV fluid, Cipro  and Flagyl  and Toradol .  General surgery was consulted who advised to admit the patient under medical service and they will see the patient.  Hospital service was consulted for evaluation for admission for acute diverticulitis failed outpatient treatment.    Assessment and Plan:  1.  Acute diverticulitis failed outpatient treatment with Augmentin  Continue  n.p.o., Continue IV fluid, pain medications, Cipro  and Flagyl  IV for surgical team's recommendation. Continue as needed pain medication Case discussed with general surgery   2.  HTN/HLD - She is n.p.o. - Continue on hydralazine  as needed for systolic blood pressure more than 160 and other medication will be held due to NPO.   3.  GERD - She takes omeprazole  at home and will be changed  to Protonix  IV twice daily.  DVT prophylaxis: Lovenox  Code Status: Full Code Family Communication: None Disposition Plan: Home Consults called: General Surgery   Subjective:  Patient seen and examined at bedside this morning Abdominal pain improving Continues to be n.p.o.  Physical Exam:  Constitutional: Alert, awake, calm, comfortable HEENT: Neck supple Respiratory: Clear to auscultation B/L, no wheezing, no rales.  Cardiovascular: Regular rate and rhythm, no murmurs / rubs / gallops. No extremity edema. 2+ pedal pulses. No carotid bruits.  Abdomen: Soft, left lower quadrant tenderness, no rebound. Musculoskeletal: no clubbing / cyanosis. Good ROM, no contractures. Normal muscle tone.  Skin: no rashes, lesions, ulcers. Neurologic: CN 2-12 grossly intact. Sensation intact, No focal deficit identified Psychiatric: Alert and oriented x 3. Normal mood.       Vitals:   02/29/24 1513 03/01/24 0358 03/01/24 0736 03/01/24 1633  BP: 128/81 121/77 124/75 (!) 132/91  Pulse: 69 73 70 77  Resp: 17 18 16 16   Temp: 98 F (36.7 C) 98.4 F (36.9 C) 98.6 F (37 C) 98.8 F (37.1 C)  TempSrc: Oral     SpO2: 100% 97% 99% 97%  Weight:      Height:        Data Reviewed: CT scan of the abdomen reviewed showing findings of diverticulitis    Latest Ref Rng & Units 03/01/2024    4:43 AM 02/29/2024   11:00 AM 02/29/2024    7:58 AM  CBC  WBC 4.0 - 10.5 K/uL 3.6  5.7  5.7    5.8   Hemoglobin 12.0 -  15.0 g/dL 88.4  87.0  86.8    87.1   Hematocrit 36.0 - 46.0 % 34.8  38.9  40.2    39.6   Platelets 150 - 400 K/uL 291  330  329    336        Latest Ref Rng & Units 03/01/2024    4:43 AM 02/29/2024   11:00 AM 02/29/2024    7:58 AM  BMP  Glucose 70 - 99 mg/dL 84   889   BUN 6 - 20 mg/dL 6   8   Creatinine 9.55 - 1.00 mg/dL 9.27  9.27  9.21   Sodium 135 - 145 mmol/L 140   138   Potassium 3.5 - 5.1 mmol/L 4.2   4.1   Chloride 98 - 111 mmol/L 107   101   CO2 22 - 32 mmol/L 24   24   Calcium  8.9  - 10.3 mg/dL 9.5   89.4     Author: Drue ONEIDA Potter, MD 03/01/2024 5:29 PM  For on call review [email or web address]  "

## 2024-03-02 DIAGNOSIS — K5732 Diverticulitis of large intestine without perforation or abscess without bleeding: Secondary | ICD-10-CM | POA: Diagnosis not present

## 2024-03-02 LAB — BASIC METABOLIC PANEL WITH GFR
Anion gap: 11 (ref 5–15)
BUN: 7 mg/dL (ref 6–20)
CO2: 24 mmol/L (ref 22–32)
Calcium: 9.3 mg/dL (ref 8.9–10.3)
Chloride: 105 mmol/L (ref 98–111)
Creatinine, Ser: 0.76 mg/dL (ref 0.44–1.00)
GFR, Estimated: >60 mL/min
Glucose, Bld: 77 mg/dL (ref 70–99)
Potassium: 4.2 mmol/L (ref 3.5–5.1)
Sodium: 140 mmol/L (ref 135–145)

## 2024-03-02 LAB — CBC WITH DIFFERENTIAL/PLATELET
Abs Immature Granulocytes: 0.01 K/uL (ref 0.00–0.07)
Basophils Absolute: 0 K/uL (ref 0.0–0.1)
Basophils Relative: 1 %
Eosinophils Absolute: 0.1 K/uL (ref 0.0–0.5)
Eosinophils Relative: 1 %
HCT: 34.9 % — ABNORMAL LOW (ref 36.0–46.0)
Hemoglobin: 11.1 g/dL — ABNORMAL LOW (ref 12.0–15.0)
Immature Granulocytes: 0 %
Lymphocytes Relative: 40 %
Lymphs Abs: 1.4 K/uL (ref 0.7–4.0)
MCH: 27.3 pg (ref 26.0–34.0)
MCHC: 31.8 g/dL (ref 30.0–36.0)
MCV: 85.7 fL (ref 80.0–100.0)
Monocytes Absolute: 0.3 K/uL (ref 0.1–1.0)
Monocytes Relative: 8 %
Neutro Abs: 1.8 K/uL (ref 1.7–7.7)
Neutrophils Relative %: 50 %
Platelets: 288 K/uL (ref 150–400)
RBC: 4.07 MIL/uL (ref 3.87–5.11)
RDW: 14.8 % (ref 11.5–15.5)
WBC: 3.6 K/uL — ABNORMAL LOW (ref 4.0–10.5)
nRBC: 0 % (ref 0.0–0.2)

## 2024-03-02 MED ORDER — BOOST / RESOURCE BREEZE PO LIQD CUSTOM
1.0000 | Freq: Three times a day (TID) | ORAL | Status: DC
  Administered 2024-03-02 – 2024-03-03 (×6): 1 via ORAL

## 2024-03-02 MED ORDER — CALCIUM CARBONATE ANTACID 500 MG PO CHEW
1.0000 | CHEWABLE_TABLET | Freq: Once | ORAL | Status: AC
  Administered 2024-03-02: 200 mg via ORAL
  Filled 2024-03-02: qty 1

## 2024-03-02 MED ORDER — PANTOPRAZOLE SODIUM 40 MG PO TBEC: 40.0000 mg | DELAYED_RELEASE_TABLET | Freq: Two times a day (BID) | ORAL | Status: DC

## 2024-03-02 NOTE — Progress Notes (Signed)
 [facility] SURGICAL PROGRESS NOTE (cpt [id number])  Hospital Day(s): 2.   Interval History: Patient seen and examined, no acute events or new complaints overnight. Patient reports her pain is improved. Still with some pressure/discomfort in suprapubic region. No fever, chills, nausea, emesis. WBC stable at 3.6K, Hgb to 11.1; stable. CMP is reassuring. She is on Cipro /Flagyl . She has been NPO overnight.   Review of Systems:  Constitutional: denies fever, chills  HEENT: denies cough or congestion  Respiratory: denies any shortness of breath  Cardiovascular: denies chest pain or palpitations  Gastrointestinal: + abdominal pain (improved), denied nausea, emesis  Genitourinary: denies burning with urination or urinary frequency Musculoskeletal: denies pain, decreased motor or sensation  Vital signs in last 24 hours: [min-max] current  Temp:  [98.3 F (36.8 C)-98.8 F (37.1 C)] 98.3 F (36.8 C) (01/06 0248) Pulse Rate:  [58-77] 58 (01/06 0248) Resp:  [16-18] 18 (01/06 0248) BP: (102-132)/(59-91) 102/59 (01/06 0248) SpO2:  [97 %-100 %] 100 % (01/06 0248)     Height: 5' 6 (167.6 cm) Weight: 77.1 kg BMI (Calculated): 27.45   Intake/Output last 2 shifts:  01/05 0701 - 01/06 0700 In: 600 [IV Piggyback:600] Out: -    Physical Exam:  Constitutional: alert, cooperative and no distress  HENT: normocephalic without obvious abnormality  Respiratory: breathing non-labored at rest  Cardiovascular: regular rate and sinus rhythm  Gastrointestinal: soft, she remains tender in suprapubic region but this is improving, and non-distended, no rebound/guarding   Labs:     Latest Ref Rng & Units 03/02/2024    4:45 AM 03/01/2024    4:43 AM 02/29/2024   11:00 AM  CBC  WBC 4.0 - 10.5 K/uL 3.6  3.6  5.7   Hemoglobin 12.0 - 15.0 g/dL 88.8  88.4  87.0   Hematocrit 36.0 - 46.0 % 34.9  34.8  38.9   Platelets 150 - 400 K/uL 288  291  330       Latest Ref Rng & Units 03/02/2024    4:45 AM 03/01/2024     4:43 AM 02/29/2024   11:00 AM  CMP  Glucose 70 - 99 mg/dL 77  84    BUN 6 - 20 mg/dL 7  6    Creatinine 9.55 - 1.00 mg/dL 9.23  9.27  9.27   Sodium 135 - 145 mmol/L 140  140    Potassium 3.5 - 5.1 mmol/L 4.2  4.2    Chloride 98 - 111 mmol/L 105  107    CO2 22 - 32 mmol/L 24  24    Calcium  8.9 - 10.3 mg/dL 9.3  9.5    Total Protein 6.5 - 8.1 g/dL  6.5    Total Bilirubin 0.0 - 1.2 mg/dL  0.4    Alkaline Phos 38 - 126 U/L  84    AST 15 - 41 U/L  22    ALT 0 - 44 U/L  12      Imaging studies: No new pertinent imaging studies   Assessment/Plan:  59 y.o. female with likely persistent diverticulitis without abscess or perforation   - Will advance to CLD this AM  - Continue IV Abx (Cipro , Flagyl )   - Monitor abdominal examination   - No need for emergent surgical intervention. She understands if she fails to improve, or deteriorates, we would need to consider surgical intervention and likely temporizing ostomy.   - Okay to hold off on repeat imaging given continued clinical progress.  - Pain control prn; antiemetics  prn - Okay to mobilize - Further management per primary service; we will follow along    All of the above findings and recommendations were discussed with the patient, and the medical team, and all of patient's questions were answered to her expressed satisfaction.  -- Arthea Platt, PA-C  [facility] 03/02/2024, 7:40 AM M-F: 7am - 4pm

## 2024-03-02 NOTE — Plan of Care (Signed)

## 2024-03-02 NOTE — Progress Notes (Signed)
" °  Progress Note   Patient: Ashley [name] FMW:[id number] DOB: 07-Jan-1966 DOA: 02/29/2024     2 DOS: the patient was seen and examined on 03/02/2024   Brief hospital course:  From HPI Ceciley Buist is a pleasant 59 y.o. female with medical history significant for prior history of diverticulitis 6 months ago, HTN, HLD, GERD presented to ED with left lower quadrant pain.  Patient was diagnosed with diverticulitis on 02/18/2024.  She is readmitted with persistent diverticulitis.  General surgery following   Assessment and Plan:  1.  Acute diverticulitis failed outpatient treatment with Augmentin  Diet has been advanced to clear liquid today by general surgery and Continue IV fluid, pain medications, Cipro  and Flagyl  IV for surgical team's recommendation. Continue as needed pain medication Case discussed with general surgery   2.  HTN/HLD Antihypertensives on hold given relative hypotension   3.  GERD Continue omeprazole    DVT prophylaxis: Lovenox  Code Status: Full Code Family Communication: None Disposition Plan: Home Consults called: General Surgery    Subjective:  Patient seen and examined at bedside this morning Abdominal pain improving Continues to be n.p.o.   Physical Exam:   Constitutional: Alert, awake, calm, comfortable HEENT: Neck supple Respiratory: Clear to auscultation B/L, no wheezing, no rales.  Cardiovascular: Regular rate and rhythm, no murmurs / rubs / gallops. No extremity edema. 2+ pedal pulses. No carotid bruits.  Abdomen: Soft, left lower quadrant tenderness, no rebound. Musculoskeletal: no clubbing / cyanosis. Good ROM, no contractures. Normal muscle tone.  Skin: no rashes, lesions, ulcers. Neurologic: CN 2-12 grossly intact. Sensation intact, No focal deficit identified Psychiatric: Alert and oriented x 3. Normal mood.       Data Reviewed: CT scan of the abdomen reviewed showing findings of diverticulitis     Latest Ref Rng & Units  03/02/2024    4:45 AM 03/01/2024    4:43 AM 02/29/2024   11:00 AM  CBC  WBC 4.0 - 10.5 K/uL 3.6  3.6  5.7   Hemoglobin 12.0 - 15.0 g/dL 88.8  88.4  87.0   Hematocrit 36.0 - 46.0 % 34.9  34.8  38.9   Platelets 150 - 400 K/uL 288  291  330     Vitals:   03/01/24 1633 03/02/24 0248 03/02/24 0832 03/02/24 1600  BP: (!) 132/91 (!) 102/59 123/85 (!) 146/87  Pulse: 77 (!) 58 85 76  Resp: 16 18 18 16   Temp: 98.8 F (37.1 C) 98.3 F (36.8 C) 98.4 F (36.9 C) 98.3 F (36.8 C)  TempSrc:   Oral   SpO2: 97% 100% 100% 99%  Weight:      Height:          Latest Ref Rng & Units 03/02/2024    4:45 AM 03/01/2024    4:43 AM 02/29/2024   11:00 AM  BMP  Glucose 70 - 99 mg/dL 77  84    BUN 6 - 20 mg/dL 7  6    Creatinine 9.55 - 1.00 mg/dL 9.23  9.27  9.27   Sodium 135 - 145 mmol/L 140  140    Potassium 3.5 - 5.1 mmol/L 4.2  4.2    Chloride 98 - 111 mmol/L 105  107    CO2 22 - 32 mmol/L 24  24    Calcium  8.9 - 10.3 mg/dL 9.3  9.5      Author: Drue ONEIDA Potter, MD 03/02/2024 6:04 PM  For on call review [email or web address]  "

## 2024-03-03 ENCOUNTER — Ambulatory Visit: Admitting: Primary Care

## 2024-03-03 DIAGNOSIS — K5792 Diverticulitis of intestine, part unspecified, without perforation or abscess without bleeding: Secondary | ICD-10-CM | POA: Diagnosis not present

## 2024-03-03 DIAGNOSIS — K5732 Diverticulitis of large intestine without perforation or abscess without bleeding: Secondary | ICD-10-CM | POA: Diagnosis not present

## 2024-03-03 LAB — CBC WITH DIFFERENTIAL/PLATELET
Abs Immature Granulocytes: 0 K/uL (ref 0.00–0.07)
Basophils Absolute: 0 K/uL (ref 0.0–0.1)
Basophils Relative: 1 %
Eosinophils Absolute: 0 K/uL (ref 0.0–0.5)
Eosinophils Relative: 1 %
HCT: 35.4 % — ABNORMAL LOW (ref 36.0–46.0)
Hemoglobin: 11.6 g/dL — ABNORMAL LOW (ref 12.0–15.0)
Immature Granulocytes: 0 %
Lymphocytes Relative: 42 %
Lymphs Abs: 1.3 K/uL (ref 0.7–4.0)
MCH: 27.6 pg (ref 26.0–34.0)
MCHC: 32.8 g/dL (ref 30.0–36.0)
MCV: 84.3 fL (ref 80.0–100.0)
Monocytes Absolute: 0.3 K/uL (ref 0.1–1.0)
Monocytes Relative: 11 %
Neutro Abs: 1.4 K/uL — ABNORMAL LOW (ref 1.7–7.7)
Neutrophils Relative %: 45 %
Platelets: 315 K/uL (ref 150–400)
RBC: 4.2 MIL/uL (ref 3.87–5.11)
RDW: 14.6 % (ref 11.5–15.5)
WBC: 3.1 K/uL — ABNORMAL LOW (ref 4.0–10.5)
nRBC: 0 % (ref 0.0–0.2)

## 2024-03-03 LAB — BASIC METABOLIC PANEL WITH GFR
Anion gap: 9 (ref 5–15)
BUN: 7 mg/dL (ref 6–20)
CO2: 26 mmol/L (ref 22–32)
Calcium: 9.9 mg/dL (ref 8.9–10.3)
Chloride: 105 mmol/L (ref 98–111)
Creatinine, Ser: 0.73 mg/dL (ref 0.44–1.00)
GFR, Estimated: >60 mL/min
Glucose, Bld: 93 mg/dL (ref 70–99)
Potassium: 4.5 mmol/L (ref 3.5–5.1)
Sodium: 139 mmol/L (ref 135–145)

## 2024-03-03 MED ORDER — CIPROFLOXACIN IN D5W 400 MG/200ML IV SOLN
400.0000 mg | Freq: Two times a day (BID) | INTRAVENOUS | Status: DC
  Filled 2024-03-03: qty 200

## 2024-03-03 MED ORDER — FLUCONAZOLE 50 MG PO TABS
150.0000 mg | ORAL_TABLET | Freq: Once | ORAL | Status: AC
  Administered 2024-03-03: 150 mg via ORAL
  Filled 2024-03-03: qty 1

## 2024-03-03 MED ORDER — PIPERACILLIN-TAZOBACTAM 3.375 G IVPB
3.3750 g | Freq: Three times a day (TID) | INTRAVENOUS | Status: DC
  Administered 2024-03-03 – 2024-03-05 (×6): 3.375 g via INTRAVENOUS
  Filled 2024-03-03 (×6): qty 50

## 2024-03-03 MED ORDER — FAMOTIDINE 20 MG PO TABS
20.0000 mg | ORAL_TABLET | Freq: Two times a day (BID) | ORAL | Status: DC
  Administered 2024-03-03 – 2024-03-05 (×5): 20 mg via ORAL
  Filled 2024-03-03 (×5): qty 1

## 2024-03-03 MED ORDER — AZELASTINE HCL 0.1 % NA SOLN
1.0000 | Freq: Two times a day (BID) | NASAL | Status: DC
  Administered 2024-03-04: 1 via NASAL
  Filled 2024-03-03: qty 30

## 2024-03-03 MED ORDER — PANTOPRAZOLE SODIUM 40 MG PO TBEC
40.0000 mg | DELAYED_RELEASE_TABLET | Freq: Two times a day (BID) | ORAL | Status: DC
  Administered 2024-03-03 – 2024-03-05 (×5): 40 mg via ORAL
  Filled 2024-03-03 (×5): qty 1

## 2024-03-03 MED ORDER — METRONIDAZOLE 500 MG/100ML IV SOLN
500.0000 mg | Freq: Two times a day (BID) | INTRAVENOUS | Status: DC
  Filled 2024-03-03: qty 100

## 2024-03-03 MED ORDER — HYDROCHLOROTHIAZIDE 25 MG PO TABS
25.0000 mg | ORAL_TABLET | Freq: Every day | ORAL | Status: DC
  Administered 2024-03-03 – 2024-03-05 (×3): 25 mg via ORAL
  Filled 2024-03-03 (×3): qty 1

## 2024-03-03 NOTE — Progress Notes (Signed)
 [facility] SURGICAL PROGRESS NOTE (cpt [phone])  Hospital Day(s): 3.   Interval History: Patient seen and examined, no acute events or new complaints overnight. Patient reports she still has localized suprapubic discomfort; relatively unchanged. No fever, chills, nausea, emesis. WBC low, but stable, at 3.1K, Hgb to 11.6; stable. BMP is reassuring. She is on Cipro /Flagyl . CLD; no issues. She does enforce acid reflux.   Review of Systems:  Constitutional: denies fever, chills  HEENT: denies cough or congestion  Respiratory: denies any shortness of breath  Cardiovascular: denies chest pain or palpitations  Gastrointestinal: + abdominal pain, denied nausea, emesis  Genitourinary: denies burning with urination or urinary frequency Musculoskeletal: denies pain, decreased motor or sensation  Vital signs in last 24 hours: [min-max] current  Temp:  [98.2 F (36.8 C)-98.4 F (36.9 C)] 98.2 F (36.8 C) (01/07 0422) Pulse Rate:  [64-85] 64 (01/07 0422) Resp:  [16-20] 20 (01/07 0422) BP: (123-150)/(85-90) 142/90 (01/07 0422) SpO2:  [98 %-100 %] 100 % (01/07 0422)     Height: 5' 6 (167.6 cm) Weight: 77.1 kg BMI (Calculated): 27.45   Intake/Output last 2 shifts:  01/06 0701 - 01/07 0700 In: 480 [P.O.:480] Out: -    Physical Exam:  Constitutional: alert, cooperative and no distress  HENT: normocephalic without obvious abnormality  Respiratory: breathing non-labored at rest  Cardiovascular: regular rate and sinus rhythm  Gastrointestinal: soft, she remains tender in suprapubic region, and non-distended, no rebound/guarding   Labs:     Latest Ref Rng & Units 03/03/2024    5:17 AM 03/02/2024    4:45 AM 03/01/2024    4:43 AM  CBC  WBC 4.0 - 10.5 K/uL 3.1  3.6  3.6   Hemoglobin 12.0 - 15.0 g/dL 88.3  88.8  88.4   Hematocrit 36.0 - 46.0 % 35.4  34.9  34.8   Platelets 150 - 400 K/uL 315  288  291       Latest Ref Rng & Units 03/03/2024    5:17 AM 03/02/2024    4:45 AM 03/01/2024     4:43 AM  CMP  Glucose 70 - 99 mg/dL 93  77  84   BUN 6 - 20 mg/dL 7  7  6    Creatinine 0.44 - 1.00 mg/dL 9.26  9.23  9.27   Sodium 135 - 145 mmol/L 139  140  140   Potassium 3.5 - 5.1 mmol/L 4.5  4.2  4.2   Chloride 98 - 111 mmol/L 105  105  107   CO2 22 - 32 mmol/L 26  24  24    Calcium  8.9 - 10.3 mg/dL 9.9  9.3  9.5   Total Protein 6.5 - 8.1 g/dL   6.5   Total Bilirubin 0.0 - 1.2 mg/dL   0.4   Alkaline Phos 38 - 126 U/L   84   AST 15 - 41 U/L   22   ALT 0 - 44 U/L   12     Imaging studies: No new pertinent imaging studies   Assessment/Plan:  59 y.o. female with likely persistent diverticulitis without abscess or perforation   - Will trial FLD this morning  - Given continued pain on Cipro /Flagyl  for 3 days, we will try to switch to Zosyn  this morning  - Added diflucan  150 mg PO x1 for concerns over yeast infection   - Monitor abdominal examination   - No need for emergent surgical intervention. She understands if she fails to improve, or deteriorates, we would  need to consider surgical intervention and likely temporizing ostomy.   - Okay to hold off on repeat imaging given continued clinical progress. - Pain control prn; antiemetics prn - Okay to mobilize - Further management per primary service; we will follow along    All of the above findings and recommendations were discussed with the patient, and the medical team, and all of patient's questions were answered to her expressed satisfaction.  -- Arthea Platt, PA-C [facility] 03/03/2024, 7:26 AM M-F: 7am - 4pm

## 2024-03-03 NOTE — Progress Notes (Signed)
 " PROGRESS NOTE    Ashley [name]  FMW:[id number] DOB: [birth year] DOA: 02/29/2024 PCP: Gretta Comer POUR, NP    Brief Narrative:   From HPI Ashley [name] is a pleasant 59 y.o. female with medical history significant for prior history of diverticulitis 6 months ago, HTN, HLD, GERD presented to ED with left lower quadrant pain.  Patient was diagnosed with diverticulitis on 02/18/2024.  She is readmitted with persistent diverticulitis.  General surgery following    Assessment & Plan:   Principal Problem:   Acute diverticulitis Active Problems:   Hyperlipidemia   GERD (gastroesophageal reflux disease)   HTN (hypertension)  Acute diverticulitis  failed outpatient treatment with Augmentin  Overall clinically improving but still experiencing abdominal pain Plan: DC Cipro  and Flagyl  Start Zosyn  Monitor pain May need repeat CT within the next 24 hours General Surgery following   HTN/HLD Continue holding statin for now Resume hydrochlorothiazide    GERD PPI and H2 blocker      DVT prophylaxis: SQL Code Status: Full Family Communication: None Disposition Plan: Status is: Inpatient Remains inpatient appropriate because: Acute diverticulitis on IV antibiotics   Level of care: Med-Surg  Consultants:  General surgery  Procedures:  None  Antimicrobials: Zosyn    Subjective: Seen and examined.  Continues to endorse lower abdominal pain though improving from prior.  Objective: Vitals:   03/02/24 1600 03/02/24 2039 03/03/24 0422 03/03/24 0921  BP: (!) 146/87 (!) 150/90 (!) 142/90 133/85  Pulse: 76 70 64 71  Resp: 16 20 20 17   Temp: 98.3 F (36.8 C) 98.4 F (36.9 C) 98.2 F (36.8 C) 98.2 F (36.8 C)  TempSrc:    Oral  SpO2: 99% 98% 100% 97%  Weight:      Height:        Intake/Output Summary (Last 24 hours) at 03/03/2024 1300 Last data filed at 03/03/2024 0900 Gross per 24 hour  Intake 240 ml  Output --  Net 240 ml   Filed Weights   02/29/24  0756  Weight: 77.1 kg    Examination:  General exam: Appears calm and comfortable  Respiratory system: Clear to auscultation. Respiratory effort normal. Cardiovascular system: S1-S2, RRR, no murmurs, no pedal edema Gastrointestinal system: Soft, not distended, tender to palpation lower abdomen Central nervous system: Alert and oriented. No focal neurological deficits. Extremities: Symmetric 5 x 5 power. Skin: No rashes, lesions or ulcers Psychiatry: Judgement and insight appear normal. Mood & affect appropriate.     Data Reviewed: I have personally reviewed following labs and imaging studies  CBC: Recent Labs  Lab 02/29/24 0758 02/29/24 1100 03/01/24 0443 03/02/24 0445 03/03/24 0517  WBC 5.8  5.7 5.7 3.6* 3.6* 3.1*  NEUTROABS 3.9  --   --  1.8 1.4*  HGB 12.8  13.1 12.9 11.5* 11.1* 11.6*  HCT 39.6  40.2 38.9 34.8* 34.9* 35.4*  MCV 83.5  83.6 82.6 83.7 85.7 84.3  PLT 336  329 330 291 288 315   Basic Metabolic Panel: Recent Labs  Lab 02/29/24 0758 02/29/24 1100 03/01/24 0443 03/02/24 0445 03/03/24 0517  NA 138  --  140 140 139  K 4.1  --  4.2 4.2 4.5  CL 101  --  107 105 105  CO2 24  --  24 24 26   GLUCOSE 110*  --  84 77 93  BUN 8  --  6 7 7   CREATININE 0.78 0.72 0.72 0.76 0.73  CALCIUM  10.5*  --  9.5 9.3 9.9   GFR: Estimated Creatinine  Clearance: 79.4 mL/min (by C-G formula based on SCr of 0.73 mg/dL). Liver Function Tests: Recent Labs  Lab 02/29/24 0758 03/01/24 0443  AST 22 22  ALT 12 12  ALKPHOS 103 84  BILITOT 0.3 0.4  PROT 7.9 6.5  ALBUMIN 4.3 3.6   Recent Labs  Lab 02/29/24 0758  LIPASE 22   No results for input(s): AMMONIA in the last 168 hours. Coagulation Profile: Recent Labs  Lab 03/01/24 0443  INR 1.1   Cardiac Enzymes: No results for input(s): CKTOTAL, CKMB, CKMBINDEX, TROPONINI in the last 168 hours. BNP (last 3 results) No results for input(s): PROBNP in the last 8760 hours. HbA1C: No results for input(s):  HGBA1C in the last 72 hours. CBG: No results for input(s): GLUCAP in the last 168 hours. Lipid Profile: No results for input(s): CHOL, HDL, LDLCALC, TRIG, CHOLHDL, LDLDIRECT in the last 72 hours. Thyroid  Function Tests: No results for input(s): TSH, T4TOTAL, FREET4, T3FREE, THYROIDAB in the last 72 hours. Anemia Panel: No results for input(s): VITAMINB12, FOLATE, FERRITIN, TIBC, IRON, RETICCTPCT in the last 72 hours. Sepsis Labs: No results for input(s): PROCALCITON, LATICACIDVEN in the last 168 hours.  No results found for this or any previous visit (from the past 240 hours).       Radiology Studies: No results found.      Scheduled Meds:  enoxaparin  (LOVENOX ) injection  40 mg Subcutaneous Q24H   feeding supplement  1 Container Oral TID BM   pantoprazole   40 mg Oral BID   Continuous Infusions:  piperacillin -tazobactam 3.375 g (03/03/24 1159)     LOS: 3 days     Calvin KATHEE Robson, MD [facility]   If 7PM-7AM, please contact night-coverage  03/03/2024, 1:00 PM   "

## 2024-03-03 NOTE — Plan of Care (Signed)

## 2024-03-04 DIAGNOSIS — K5792 Diverticulitis of intestine, part unspecified, without perforation or abscess without bleeding: Secondary | ICD-10-CM | POA: Diagnosis not present

## 2024-03-04 DIAGNOSIS — K5732 Diverticulitis of large intestine without perforation or abscess without bleeding: Secondary | ICD-10-CM | POA: Diagnosis not present

## 2024-03-04 LAB — CBC
HCT: 36 % (ref 36.0–46.0)
Hemoglobin: 12.1 g/dL (ref 12.0–15.0)
MCH: 28.2 pg (ref 26.0–34.0)
MCHC: 33.6 g/dL (ref 30.0–36.0)
MCV: 83.9 fL (ref 80.0–100.0)
Platelets: 327 K/uL (ref 150–400)
RBC: 4.29 MIL/uL (ref 3.87–5.11)
RDW: 14.8 % (ref 11.5–15.5)
WBC: 3.1 K/uL — ABNORMAL LOW (ref 4.0–10.5)
nRBC: 0 % (ref 0.0–0.2)

## 2024-03-04 MED ORDER — ENSURE PLUS HIGH PROTEIN PO LIQD
237.0000 mL | Freq: Three times a day (TID) | ORAL | Status: DC
  Administered 2024-03-04 (×3): 237 mL via ORAL

## 2024-03-04 MED ORDER — ADULT MULTIVITAMIN W/MINERALS CH
1.0000 | ORAL_TABLET | Freq: Every day | ORAL | Status: DC
  Administered 2024-03-05: 1 via ORAL
  Filled 2024-03-04: qty 1

## 2024-03-04 NOTE — Progress Notes (Signed)
 Initial Nutrition Assessment  DOCUMENTATION CODES:   Not applicable  INTERVENTION:   Ensure Plus High Protein po TID, each supplement provides 350 kcal and 20 grams of protein  MVI po daily   Pt at high refeed risk; recommend monitor potassium, magnesium and phosphorus labs daily until stable  Daily weights   Diverticulitis diet education   NUTRITION DIAGNOSIS:   Inadequate oral intake related to acute illness as evidenced by per patient/family report.  GOAL:   Patient will meet greater than or equal to 90% of their needs  MONITOR:   Supplement acceptance, PO intake, Diet advancement, Weight trends, Labs, Skin, I & O's  REASON FOR ASSESSMENT:   Malnutrition Screening Tool    ASSESSMENT:   59 y/o female with h/o HTN, HLD, GERD, diverticulitis, TIA, chronic pain, HSV, B12 deficiency and constipation who is admitted with acute recurrent diverticulitis.  Met with pt in room today. Pt reports good appetite and oral intake at baseline but reports her oral intake has waxed and waned over the past several months r/t abdominal pain and cramping. Pt reports that she has had diverticulitis many times in the past but reports that this was the worst episode that she has had yet. Pt enjoys strawberry and vanilla Ensure and has been drinking this in the hospital. RD discussed with pt the importance of adequate nutrition needed to preserve lean muscle. Pt agreeable to continue Ensure supplements. Pt is at high refeed risk. Pt with good appetite and oral intake in hospital; pt eating 95-100% of meals. Per chart, pt is weight stable at baseline. RD provided pt with diverticulitis diet education today.    Medications reviewed and include: lovenox , pepcid , hydrochlorothiazide , MVI, protonix , zosyn    Labs reviewed: K 4.5 wnl,  Wbc- 3.1(L)  NUTRITION - FOCUSED PHYSICAL EXAM:  Flowsheet Row Most Recent Value  Orbital Region No depletion  Upper Arm Region No depletion  Thoracic and Lumbar  Region No depletion  Buccal Region No depletion  Temple Region No depletion  Clavicle Bone Region No depletion  Clavicle and Acromion Bone Region No depletion  Scapular Bone Region No depletion  Dorsal Hand No depletion  Patellar Region No depletion  Anterior Thigh Region No depletion  Posterior Calf Region No depletion  Edema (RD Assessment) None  Hair Reviewed  Eyes Reviewed  Mouth Reviewed  Skin Reviewed  Nails Reviewed   Diet Order:   Diet Order             DIET SOFT Fluid consistency: Thin  Diet effective now                  EDUCATION NEEDS:   Education needs have been addressed  Skin:  Skin Assessment: Reviewed RN Assessment  Last BM:  1/8- type 6  Height:   Ht Readings from Last 1 Encounters:  02/29/24 5' 6 (1.676 m)    Weight:   Wt Readings from Last 1 Encounters:  03/04/24 77.9 kg    Ideal Body Weight:  59 kg  BMI:  Body mass index is 27.71 kg/m.  Estimated Nutritional Needs:   Kcal:  1800-2100kcal/day  Protein:  90-105g/day  Fluid:  1.8-2.1L/day  Augustin Shams MS, RD, LDN If unable to be reached, please send secure chat to RD inpatient available from 8:00a-4:00p daily

## 2024-03-04 NOTE — Plan of Care (Signed)
 Nutrition Education Note  RD provided patient with diverticulitis education today.  RD provided Nutrition and Diverticulitis handout with supporting information. Reviewed patient's dietary recall. Provided examples of low and high fiber foods. Discouraged intake of high fiber foods, high fat foods, spicy foods, processed foods, caffeine and red meats when having a flare. Encouraged pt to cook foods until they are soft and chew foods well to help aid in digestion. Also recommend frequent small meals. Encouraged use of a multi-vitamin and protein supplements while having a flare.    RD encourage intake of high fiber foods when not having a flare.   Expect good compliance.  RD following this patient   Augustin Shams MS, RD, LDN If unable to be reached, please send secure chat to RD inpatient available from 8:00a-4:00p daily

## 2024-03-04 NOTE — Progress Notes (Signed)
 " PROGRESS NOTE    Ashley [name]  FMW:[id number] DOB: 13-Dec-1965 DOA: 02/29/2024 PCP: Gretta Comer POUR, NP    Brief Narrative:   From HPI Ashley [name] is a pleasant 60 y.o. female with medical history significant for prior history of diverticulitis 6 months ago, HTN, HLD, GERD presented to ED with left lower quadrant pain.  Patient was diagnosed with diverticulitis on 02/18/2024.  She is readmitted with persistent diverticulitis.  General surgery following    Assessment & Plan:   Principal Problem:   Acute diverticulitis Active Problems:   Hyperlipidemia   GERD (gastroesophageal reflux disease)   HTN (hypertension)  Acute diverticulitis  failed outpatient treatment with Augmentin  Overall clinically improving but still experiencing abdominal pain Abdominal pain improved on 1/8.  Having bowel movements Plan: Zosyn  Monitor pain If patient continues to clinically improve anticipate discharge 1/9   HTN/HLD Continue holding statin for now, can likely resume on discharge Resume hydrochlorothiazide    GERD PPI and H2 blocker      DVT prophylaxis: SQL Code Status: Full Family Communication: None Disposition Plan: Status is: Inpatient Remains inpatient appropriate because: Acute diverticulitis on IV antibiotics   Level of care: Med-Surg  Consultants:  General surgery  Procedures:  None  Antimicrobials: Zosyn    Subjective: Seen and examined.  Improvement in abdominal pain  Objective: Vitals:   03/03/24 1518 03/03/24 1955 03/04/24 0348 03/04/24 0952  BP: 124/85 128/83 130/85 123/74  Pulse: 75 72 70 81  Resp: 16 16 16 16   Temp: 98 F (36.7 C) 98.2 F (36.8 C) 98 F (36.7 C) 98.2 F (36.8 C)  TempSrc: Oral     SpO2: 100% 100% 100% 98%  Weight:      Height:        Intake/Output Summary (Last 24 hours) at 03/04/2024 1117 Last data filed at 03/04/2024 1027 Gross per 24 hour  Intake 1094.17 ml  Output --  Net 1094.17 ml   Filed Weights    02/29/24 0756  Weight: 77.1 kg    Examination:  General exam: Appears calm and comfortable  Respiratory system: Clear to auscultation. Respiratory effort normal. Cardiovascular system: S1-S2, RRR, no murmurs, no pedal edema Gastrointestinal system: Soft, nondistended, mild TTP lower abdomen Central nervous system: Alert and oriented. No focal neurological deficits. Extremities: Symmetric 5 x 5 power. Skin: No rashes, lesions or ulcers Psychiatry: Judgement and insight appear normal. Mood & affect appropriate.     Data Reviewed: I have personally reviewed following labs and imaging studies  CBC: Recent Labs  Lab 02/29/24 0758 02/29/24 1100 03/01/24 0443 03/02/24 0445 03/03/24 0517 03/04/24 0500  WBC 5.8  5.7 5.7 3.6* 3.6* 3.1* 3.1*  NEUTROABS 3.9  --   --  1.8 1.4*  --   HGB 12.8  13.1 12.9 11.5* 11.1* 11.6* 12.1  HCT 39.6  40.2 38.9 34.8* 34.9* 35.4* 36.0  MCV 83.5  83.6 82.6 83.7 85.7 84.3 83.9  PLT 336  329 330 291 288 315 327   Basic Metabolic Panel: Recent Labs  Lab 02/29/24 0758 02/29/24 1100 03/01/24 0443 03/02/24 0445 03/03/24 0517  NA 138  --  140 140 139  K 4.1  --  4.2 4.2 4.5  CL 101  --  107 105 105  CO2 24  --  24 24 26   GLUCOSE 110*  --  84 77 93  BUN 8  --  6 7 7   CREATININE 0.78 0.72 0.72 0.76 0.73  CALCIUM  10.5*  --  9.5 9.3 9.9  GFR: Estimated Creatinine Clearance: 79.4 mL/min (by C-G formula based on SCr of 0.73 mg/dL). Liver Function Tests: Recent Labs  Lab 02/29/24 0758 03/01/24 0443  AST 22 22  ALT 12 12  ALKPHOS 103 84  BILITOT 0.3 0.4  PROT 7.9 6.5  ALBUMIN 4.3 3.6   Recent Labs  Lab 02/29/24 0758  LIPASE 22   No results for input(s): AMMONIA in the last 168 hours. Coagulation Profile: Recent Labs  Lab 03/01/24 0443  INR 1.1   Cardiac Enzymes: No results for input(s): CKTOTAL, CKMB, CKMBINDEX, TROPONINI in the last 168 hours. BNP (last 3 results) No results for input(s): PROBNP in the last  8760 hours. HbA1C: No results for input(s): HGBA1C in the last 72 hours. CBG: No results for input(s): GLUCAP in the last 168 hours. Lipid Profile: No results for input(s): CHOL, HDL, LDLCALC, TRIG, CHOLHDL, LDLDIRECT in the last 72 hours. Thyroid  Function Tests: No results for input(s): TSH, T4TOTAL, FREET4, T3FREE, THYROIDAB in the last 72 hours. Anemia Panel: No results for input(s): VITAMINB12, FOLATE, FERRITIN, TIBC, IRON, RETICCTPCT in the last 72 hours. Sepsis Labs: No results for input(s): PROCALCITON, LATICACIDVEN in the last 168 hours.  No results found for this or any previous visit (from the past 240 hours).       Radiology Studies: No results found.      Scheduled Meds:  azelastine   1 spray Each Nare BID   enoxaparin  (LOVENOX ) injection  40 mg Subcutaneous Q24H   famotidine   20 mg Oral BID   feeding supplement  237 mL Oral TID BM   hydrochlorothiazide   25 mg Oral Daily   [START ON 03/05/2024] multivitamin with minerals  1 tablet Oral Daily   pantoprazole   40 mg Oral BID   Continuous Infusions:  piperacillin -tazobactam 3.375 g (03/04/24 0534)     LOS: 4 days     Calvin KATHEE Robson, MD [facility]   If 7PM-7AM, please contact night-coverage  03/04/2024, 11:17 AM   "

## 2024-03-04 NOTE — Progress Notes (Signed)
 [facility] SURGICAL PROGRESS NOTE (cpt [phone])  Hospital Day(s): 4.   Interval History: Patient seen and examined, no acute events or new complaints overnight. Patient reports her abdominal pain is getting better now. No fever, chills, nausea, emesis. Stable leukopenia with WBC 3.1K. Hgb to 12.1. She has tolerated FLD without issue. She is passing flatus and had a BM x2.   Review of Systems:  Constitutional: denies fever, chills  HEENT: denies cough or congestion  Respiratory: denies any shortness of breath  Cardiovascular: denies chest pain or palpitations  Gastrointestinal: + abdominal pain (improving), denied nausea, emesis  Genitourinary: denies burning with urination or urinary frequency Musculoskeletal: denies pain, decreased motor or sensation  Vital signs in last 24 hours: [min-max] current  Temp:  [98 F (36.7 C)-98.2 F (36.8 C)] 98 F (36.7 C) (01/08 0348) Pulse Rate:  [70-75] 70 (01/08 0348) Resp:  [16-17] 16 (01/08 0348) BP: (124-133)/(83-85) 130/85 (01/08 0348) SpO2:  [97 %-100 %] 100 % (01/08 0348)     Height: 5' 6 (167.6 cm) Weight: 77.1 kg BMI (Calculated): 27.45   Intake/Output last 2 shifts:  01/07 0701 - 01/08 0700 In: 774.2 [P.O.:720; IV Piggyback:54.2] Out: -    Physical Exam:  Constitutional: alert, cooperative and no distress  HENT: normocephalic without obvious abnormality  Respiratory: breathing non-labored at rest  Cardiovascular: regular rate and sinus rhythm  Gastrointestinal: soft, abdominal tenderness improved this AM, and non-distended, no rebound/guarding   Labs:     Latest Ref Rng & Units 03/04/2024    5:00 AM 03/03/2024    5:17 AM 03/02/2024    4:45 AM  CBC  WBC 4.0 - 10.5 K/uL 3.1  3.1  3.6   Hemoglobin 12.0 - 15.0 g/dL 87.8  88.3  88.8   Hematocrit 36.0 - 46.0 % 36.0  35.4  34.9   Platelets 150 - 400 K/uL 327  315  288       Latest Ref Rng & Units 03/03/2024    5:17 AM 03/02/2024    4:45 AM 03/01/2024    4:43 AM  CMP   Glucose 70 - 99 mg/dL 93  77  84   BUN 6 - 20 mg/dL 7  7  6    Creatinine 0.44 - 1.00 mg/dL 9.26  9.23  9.27   Sodium 135 - 145 mmol/L 139  140  140   Potassium 3.5 - 5.1 mmol/L 4.5  4.2  4.2   Chloride 98 - 111 mmol/L 105  105  107   CO2 22 - 32 mmol/L 26  24  24    Calcium  8.9 - 10.3 mg/dL 9.9  9.3  9.5   Total Protein 6.5 - 8.1 g/dL   6.5   Total Bilirubin 0.0 - 1.2 mg/dL   0.4   Alkaline Phos 38 - 126 U/L   84   AST 15 - 41 U/L   22   ALT 0 - 44 U/L   12     Imaging studies: No new pertinent imaging studies   Assessment/Plan:  59 y.o. female with likely persistent diverticulitis without abscess or perforation   - Okay to advance to soft diet today   - Continue IV Abx (Zosyn )  - Monitor abdominal examination   - No need for emergent surgical intervention. She understands if she fails to improve, or deteriorates, we would need to consider surgical intervention and likely temporizing ostomy.   - Okay to hold off on repeat imaging given continued clinical progress. - Pain  control prn; antiemetics prn - Okay to mobilize - Further management per primary service; we will follow along     - Discharge Planning: We will keep another 24 hours for IV Abx. If she continues to improve, likely okay to discharge home tomorrow AM.   All of the above findings and recommendations were discussed with the patient, and the medical team, and all of patient's questions were answered to her expressed satisfaction.  -- Arthea Platt, PA-C [facility] 03/04/2024, 7:22 AM M-F: 7am - 4pm

## 2024-03-05 ENCOUNTER — Other Ambulatory Visit: Payer: Self-pay

## 2024-03-05 DIAGNOSIS — K5792 Diverticulitis of intestine, part unspecified, without perforation or abscess without bleeding: Secondary | ICD-10-CM | POA: Diagnosis not present

## 2024-03-05 DIAGNOSIS — K5732 Diverticulitis of large intestine without perforation or abscess without bleeding: Secondary | ICD-10-CM | POA: Diagnosis not present

## 2024-03-05 LAB — CBC WITH DIFFERENTIAL/PLATELET
Abs Immature Granulocytes: 0 K/uL (ref 0.00–0.07)
Basophils Absolute: 0 K/uL (ref 0.0–0.1)
Basophils Relative: 1 %
Eosinophils Absolute: 0.1 K/uL (ref 0.0–0.5)
Eosinophils Relative: 2 %
HCT: 36.4 % (ref 36.0–46.0)
Hemoglobin: 12.1 g/dL (ref 12.0–15.0)
Immature Granulocytes: 0 %
Lymphocytes Relative: 44 %
Lymphs Abs: 1.7 K/uL (ref 0.7–4.0)
MCH: 27.6 pg (ref 26.0–34.0)
MCHC: 33.2 g/dL (ref 30.0–36.0)
MCV: 83.1 fL (ref 80.0–100.0)
Monocytes Absolute: 0.4 K/uL (ref 0.1–1.0)
Monocytes Relative: 9 %
Neutro Abs: 1.7 K/uL (ref 1.7–7.7)
Neutrophils Relative %: 44 %
Platelets: 328 K/uL (ref 150–400)
RBC: 4.38 MIL/uL (ref 3.87–5.11)
RDW: 14.8 % (ref 11.5–15.5)
WBC: 3.9 K/uL — ABNORMAL LOW (ref 4.0–10.5)
nRBC: 0 % (ref 0.0–0.2)

## 2024-03-05 LAB — BASIC METABOLIC PANEL WITH GFR
Anion gap: 9 (ref 5–15)
BUN: 21 mg/dL — ABNORMAL HIGH (ref 6–20)
CO2: 29 mmol/L (ref 22–32)
Calcium: 9.8 mg/dL (ref 8.9–10.3)
Chloride: 101 mmol/L (ref 98–111)
Creatinine, Ser: 1 mg/dL (ref 0.44–1.00)
GFR, Estimated: >60 mL/min
Glucose, Bld: 93 mg/dL (ref 70–99)
Potassium: 4.1 mmol/L (ref 3.5–5.1)
Sodium: 139 mmol/L (ref 135–145)

## 2024-03-05 LAB — MAGNESIUM: Magnesium: 2 mg/dL (ref 1.7–2.4)

## 2024-03-05 LAB — PHOSPHORUS: Phosphorus: 3.5 mg/dL (ref 2.5–4.6)

## 2024-03-05 MED ORDER — OXYCODONE HCL 5 MG PO TABS
5.0000 mg | ORAL_TABLET | ORAL | 0 refills | Status: AC | PRN
  Filled 2024-03-05: qty 15, 3d supply, fill #0

## 2024-03-05 MED ORDER — AMOXICILLIN-POT CLAVULANATE 875-125 MG PO TABS
1.0000 | ORAL_TABLET | Freq: Two times a day (BID) | ORAL | 0 refills | Status: AC
  Filled 2024-03-05: qty 20, 10d supply, fill #0

## 2024-03-05 NOTE — Plan of Care (Signed)
  Problem: Education: Goal: Knowledge of General Education information will improve Description: Including pain rating scale, medication(s)/side effects and non-pharmacologic comfort measures Outcome: Progressing   Problem: Health Behavior/Discharge Planning: Goal: Ability to manage health-related needs will improve Outcome: Progressing   Problem: Clinical Measurements: Goal: Ability to maintain clinical measurements within normal limits will improve Outcome: Progressing Goal: Will remain free from infection Outcome: Progressing Goal: Diagnostic test results will improve Outcome: Progressing   Problem: Activity: Goal: Risk for activity intolerance will decrease Outcome: Progressing   Problem: Nutrition: Goal: Adequate nutrition will be maintained Outcome: Progressing   Problem: Coping: Goal: Level of anxiety will decrease Outcome: Progressing   Problem: Elimination: Goal: Will not experience complications related to bowel motility Outcome: Progressing   Problem: Pain Managment: Goal: General experience of comfort will improve and/or be controlled Outcome: Progressing   Problem: Safety: Goal: Ability to remain free from injury will improve Outcome: Progressing   Problem: Skin Integrity: Goal: Risk for impaired skin integrity will decrease Outcome: Progressing

## 2024-03-05 NOTE — Progress Notes (Addendum)
  [facility] (cpt [phone])  Hospital Day(s): 5.   Interval History: Patient seen and examined, no acute events or new complaints overnight. Patient reports she is feeling much better. Very minimal soreness in suprapubic region; markedly improved from presentation. No fever, chills, nausea, emesis. Leukopenia improving; WBC 3.9K. Hgb to 12.1 which is stable. Renal function normal; sCr - 1.00; UO - unmeasured. No electrolyte derangements. Diet advanced to soft diet; no issues. She is on Zosyn .   Review of Systems:  Constitutional: denies fever, chills  HEENT: denies cough or congestion  Respiratory: denies any shortness of breath  Cardiovascular: denies chest pain or palpitations  Gastrointestinal: denied abdominal pain, denied nausea, emesis  Genitourinary: denies burning with urination or urinary frequency Musculoskeletal: denies pain, decreased motor or sensation  Vital signs in last 24 hours: [min-max] current  Temp:  [97.7 F (36.5 C)-98.4 F (36.9 C)] 98.4 F (36.9 C) (01/09 0758) Pulse Rate:  [66-81] 72 (01/09 0758) Resp:  [16-18] 17 (01/09 0758) BP: (101-123)/(70-78) 111/78 (01/09 0758) SpO2:  [98 %-100 %] 100 % (01/09 0758) Weight:  [77.9 kg-79 kg] 79 kg (01/09 0326)     Height: 5' 6 (167.6 cm) Weight: 79 kg BMI (Calculated): 28.12   Intake/Output last 2 shifts:  01/08 0701 - 01/09 0700 In: 1751.9 [P.O.:1480; IV Piggyback:271.9] Out: -    Physical Exam:  Constitutional: alert, cooperative and no distress  HENT: normocephalic without obvious abnormality  Respiratory: breathing non-labored at rest  Cardiovascular: regular rate and sinus rhythm  Gastrointestinal: soft, previous abdominal tenderness is resolved, and non-distended, no rebound/guarding. She is certainly not peritonitic.   Labs:     Latest Ref Rng & Units 03/05/2024    4:59 AM 03/04/2024    5:00 AM 03/03/2024    5:17 AM  CBC  WBC 4.0 - 10.5 K/uL 3.9  3.1  3.1   Hemoglobin  12.0 - 15.0 g/dL 87.8  87.8  88.3   Hematocrit 36.0 - 46.0 % 36.4  36.0  35.4   Platelets 150 - 400 K/uL 328  327  315       Latest Ref Rng & Units 03/05/2024    4:59 AM 03/03/2024    5:17 AM 03/02/2024    4:45 AM  CMP  Glucose 70 - 99 mg/dL 93  93  77   BUN 6 - 20 mg/dL 21  7  7    Creatinine 0.44 - 1.00 mg/dL 8.99  9.26  9.23   Sodium 135 - 145 mmol/L 139  139  140   Potassium 3.5 - 5.1 mmol/L 4.1  4.5  4.2   Chloride 98 - 111 mmol/L 101  105  105   CO2 22 - 32 mmol/L 29  26  24    Calcium  8.9 - 10.3 mg/dL 9.8  9.9  9.3     Imaging studies: No new pertinent imaging studies   Assessment/Plan:  59 y.o. female with likely persistent diverticulitis without abscess or perforation   - Okay to continue diet as tolerated; Reviewed dietary recommendations for home  - Continue IV Abx (Zosyn ); I do think we can transition back to Augmentin  to complete 14 days (IV+PO) for home.   - Monitor abdominal examination   - No need for emergent surgical intervention. She understands if she fails to improve, or deteriorates, we would need to consider surgical intervention and likely temporizing ostomy.   - Okay to hold off on repeat imaging given continued clinical progress. - Pain control prn;  antiemetics prn - Okay to mobilize - Further management per primary service; we will follow along    - Discharge Planning:   - Okay for discharge from surgical perspective  - Abx as above  - She has follow up with GI next week which is reasonable to keep   - We will follow up in 2-3 weeks; She understands she may benefit from consideration of elective sigmoid colectomy in the future given her recurrent episodes in the past year.   All of the above findings and recommendations were discussed with the patient, and the medical team, and all of patient's questions were answered to her expressed satisfaction.  -- Arthea Platt, PA-C [facility] 03/05/2024, 8:26 AM M-F: 7am - 4pm

## 2024-03-05 NOTE — Discharge Summary (Signed)
 Physician Discharge Summary  Ashley [name] FMW:[id number] DOB: Dec 10, 1965 DOA: 02/29/2024  PCP: Ashley Comer POUR, NP  Admit date: 02/29/2024 Discharge date: 03/05/2024  Admitted From: Home Disposition:  Home  Recommendations for Outpatient Follow-up:  Follow up with PCP in 1-2 weeks FU GI I-2 weeks FU gen surg 3 weeks  Home Health:No  Equipment/Devices:None   Discharge Condition:Stable  CODE STATUS:FULL  Diet recommendation: Soft/bland  Brief/Interim Summary:   From HPI Ashley [name] is a pleasant 59 y.o. female with medical history significant for prior history of diverticulitis 6 months ago, HTN, HLD, GERD presented to ED with left lower quadrant pain.  Patient was diagnosed with diverticulitis on 02/18/2024.  She is readmitted with persistent diverticulitis.  General surgery following      Discharge Diagnoses:  Principal Problem:   Acute diverticulitis Active Problems:   Hyperlipidemia   GERD (gastroesophageal reflux disease)   HTN (hypertension)   Acute diverticulitis  failed outpatient treatment with Augmentin  Overall clinically improving but still experiencing abdominal pain Abdominal pain improved on 1/8.  Having bowel movements Approaching baseline on 1/9 Plan: Cleared for discharge home.  Augmentin  x 10 days.  Follow-up outpatient GI and general surgery.  Soft diet recommended.   HTN/HLD Resume statin and hydrochlorothiazide  on discharge   GERD PPI and H2 blocker      Discharge Instructions  Discharge Instructions     Increase activity slowly   Complete by: As directed       Allergies as of 03/05/2024       Reactions   Aczone [dapsone] Swelling   Levaquin  [levofloxacin ]    Presumed cause of joint aches        Medication List     PAUSE taking these medications    tiZANidine  2 MG tablet Wait to take this until your doctor or other care provider tells you to start again. Commonly known as: ZANAFLEX  Take between 1mg   (Half a tablet) and 4mg  (two tablets) up to three times daily as needed for musculoskeletal pain or muscle spasms.       STOP taking these medications    fluticasone  50 MCG/ACT nasal spray Commonly known as: FLONASE        TAKE these medications    amoxicillin -clavulanate 875-125 MG tablet Commonly known as: AUGMENTIN  Take 1 tablet by mouth 2 (two) times daily for 10 days.   aspirin  EC 81 MG tablet Take by mouth.   atorvastatin  10 MG tablet Commonly known as: LIPITOR Take 1 tablet (10 mg total) by mouth daily. for cholesterol.   azelastine  0.1 % nasal spray Commonly known as: ASTELIN  Place 1 spray into both nostrils 2 (two) times daily. Use in each nostril as directed   cetirizine  10 MG tablet Commonly known as: ZYRTEC  Take 1 tablet (10 mg total) by mouth at bedtime. For throat drainage   clindamycin 1 % external solution Commonly known as: CLEOCIN T SMARTSIG:Topical   dexlansoprazole 60 MG capsule Commonly known as: DEXILANT Take 60 mg by mouth daily.   famotidine  20 MG tablet Commonly known as: PEPCID  Take 1 tablet (20 mg total) by mouth 2 (two) times daily.   gabapentin 600 MG tablet Commonly known as: NEURONTIN Take 1,200 mg by mouth daily.   hydrochlorothiazide  25 MG tablet Commonly known as: HYDRODIURIL  Take 1 tablet (25 mg total) by mouth daily. for blood pressure.   hydrocortisone 2.5 % cream SMARTSIG:Topical   ketoconazole 2 % cream Commonly known as: NIZORAL SMARTSIG:1 Application Topical 1 to 2 Times Daily  omeprazole  40 MG capsule Commonly known as: PRILOSEC Take 1 capsule (40 mg total) by mouth 2 (two) times daily.   oxyCODONE  5 MG immediate release tablet Commonly known as: Oxy IR/ROXICODONE  Take 1 tablet (5 mg total) by mouth every 4 (four) hours as needed for severe pain (pain score 7-10).   valACYclovir  500 MG tablet Commonly known as: VALTREX  Take 1 tablet (500 mg total) by mouth daily. For suppression.  Take two tablets for 5  days in the event of an outbreak.        Follow-up Information     Desiderio Schanz, MD. Go on 03/26/2024.   Specialty: General Surgery Why: Go to appointment on 01/30 at 10 AM Contact information: [address] [place] [address] [phone]                Allergies[1]  Consultations: General surgery   Procedures/Studies: CT ABDOMEN PELVIS W CONTRAST Result Date: 02/29/2024 EXAM: CT ABDOMEN AND PELVIS WITH CONTRAST 02/29/2024 09:01:24 AM TECHNIQUE: CT of the abdomen and pelvis was performed with the administration of 100 mL of iohexol  (OMNIPAQUE ) 300 MG/ML solution. Multiplanar reformatted images are provided for review. Automated exposure control, iterative reconstruction, and/or weight-based adjustment of the mA/kV was utilized to reduce the radiation dose to as low as reasonably achievable. COMPARISON: CT abdomen and pelvis 02/18/2024 CLINICAL HISTORY: Diverticulitis, complication suspected; LLQ abdominal pain. FINDINGS: LOWER CHEST: No acute abnormality. LIVER: The liver is unremarkable. GALLBLADDER AND BILE DUCTS: Status post cholecystectomy. No biliary ductal dilatation. SPLEEN: No acute abnormality. PANCREAS: No acute abnormality. ADRENAL GLANDS: No acute abnormality. KIDNEYS, URETERS AND BLADDER: No stones in the kidneys or ureters. No hydronephrosis. No perinephric or periureteral stranding. Urinary bladder is unremarkable. No filling defects of the partially visualized collecting systems on delayed imaging. GI AND BOWEL: Stomach demonstrates no acute abnormality. No small or large bowel thickening or dilatation. Colonic diverticulosis. Focal mid sigmoid colon bowel thickening and pericolonic fat stranding no definite intramural hematoma formation. The appendix is unremarkable. PERITONEUM AND RETROPERITONEUM: Trace free fluid within the pelvis. No free air. No intraperitoneal organized fluid collection. VASCULATURE: Aorta is normal in caliber. LYMPH NODES: No  lymphadenopathy. REPRODUCTIVE ORGANS: Status post hysterectomy. No adnexal mass. BONES AND SOFT TISSUES: No acute osseous abnormality. Tiny fat-containing umbilical hernia. IMPRESSION: 1. Colonic diverticulosis with uncomplicated acute mid sigmoid diverticulitis. Recommend colonoscopy status post treatment and status post complete resolution of inflammatory changes to exclude an underlying lesion. 2. Status post cholecystectomy and hysterectomy . Electronically signed by: Morgane Naveau MD 02/29/2024 09:06 AM EST RP Workstation: [id number]   CT ABDOMEN PELVIS W CONTRAST Result Date: 02/18/2024 CLINICAL DATA:  Lower abdominal pain. EXAM: CT ABDOMEN AND PELVIS WITH CONTRAST TECHNIQUE: Multidetector CT imaging of the abdomen and pelvis was performed using the standard protocol following bolus administration of intravenous contrast. RADIATION DOSE REDUCTION: This exam was performed according to the departmental dose-optimization program which includes automated exposure control, adjustment of the mA and/or kV according to patient size and/or use of iterative reconstruction technique. CONTRAST:  OMNIPAQUE  IOHEXOL  300 MG/ML  SOLN COMPARISON:  CT abdomen pelvis: 11/29/2022. FINDINGS: Lower chest: The visualized lung bases are clear. No intra-abdominal free air or free fluid. Hepatobiliary: The liver is unremarkable. No biliary ductal dilatation. Cholecystectomy. No retained calcified stone noted in the central CBD. Pancreas: Unremarkable. No pancreatic ductal dilatation or surrounding inflammatory changes. Spleen: Normal in size without focal abnormality. Adrenals/Urinary Tract: The adrenal glands unremarkable. The kidneys, visualized ureters, and urinary bladder appear  unremarkable. Stomach/Bowel: There is sigmoid diverticulosis. There is active inflammatory changes consistent with acute diverticulitis. No abscess or perforation. There is no bowel obstruction. The appendix is normal. Vascular/Lymphatic: Mild  aortoiliac atherosclerotic disease. The IVC is unremarkable. No portal venous gas. There is no adenopathy. Reproductive: Hysterectomy.  No suspicious adnexal masses. Other: Small fat containing umbilical hernia. Musculoskeletal: No acute osseous pathology. IMPRESSION: 1. Sigmoid diverticulitis. No abscess or perforation. 2.  Aortic Atherosclerosis (ICD10-I70.0). Electronically Signed   By: Vanetta Chou M.D.   On: 02/18/2024 18:42      Subjective: Seen and examined the day of discharge stable, no complaints.  Appropriate for discharge home.  Discharge Exam: Vitals:   03/05/24 0326 03/05/24 0758  BP: 101/70 111/78  Pulse: 66 72  Resp: 18 17  Temp: 97.7 F (36.5 C) 98.4 F (36.9 C)  SpO2: 100% 100%   Vitals:   03/04/24 1325 03/04/24 2027 03/05/24 0326 03/05/24 0758  BP:  116/74 101/70 111/78  Pulse:  81 66 72  Resp:  18 18 17   Temp:  98.2 F (36.8 C) 97.7 F (36.5 C) 98.4 F (36.9 C)  TempSrc:      SpO2:  99% 100% 100%  Weight: 77.9 kg  79 kg   Height:        General: Pt is alert, awake, not in acute distress Cardiovascular: RRR, S1/S2 +, no rubs, no gallops Respiratory: CTA bilaterally, no wheezing, no rhonchi Abdominal: Soft, NT, ND, bowel sounds + Extremities: no edema, no cyanosis    The results of significant diagnostics from this hospitalization (including imaging, microbiology, ancillary and laboratory) are listed below for reference.     Microbiology: No results found for this or any previous visit (from the past 240 hours).   Labs: BNP (last 3 results) No results for input(s): BNP in the last 8760 hours. Basic Metabolic Panel: Recent Labs  Lab 02/29/24 0758 02/29/24 1100 03/01/24 0443 03/02/24 0445 03/03/24 0517 03/05/24 0459  NA 138  --  140 140 139 139  K 4.1  --  4.2 4.2 4.5 4.1  CL 101  --  107 105 105 101  CO2 24  --  24 24 26 29   GLUCOSE 110*  --  84 77 93 93  BUN 8  --  6 7 7  21*  CREATININE 0.78 0.72 0.72 0.76 0.73 1.00  CALCIUM   10.5*  --  9.5 9.3 9.9 9.8  MG  --   --   --   --   --  2.0  PHOS  --   --   --   --   --  3.5   Liver Function Tests: Recent Labs  Lab 02/29/24 0758 03/01/24 0443  AST 22 22  ALT 12 12  ALKPHOS 103 84  BILITOT 0.3 0.4  PROT 7.9 6.5  ALBUMIN 4.3 3.6   Recent Labs  Lab 02/29/24 0758  LIPASE 22   No results for input(s): AMMONIA in the last 168 hours. CBC: Recent Labs  Lab 02/29/24 0758 02/29/24 1100 03/01/24 0443 03/02/24 0445 03/03/24 0517 03/04/24 0500 03/05/24 0459  WBC 5.8  5.7   < > 3.6* 3.6* 3.1* 3.1* 3.9*  NEUTROABS 3.9  --   --  1.8 1.4*  --  1.7  HGB 12.8  13.1   < > 11.5* 11.1* 11.6* 12.1 12.1  HCT 39.6  40.2   < > 34.8* 34.9* 35.4* 36.0 36.4  MCV 83.5  83.6   < > 83.7 85.7 84.3 83.9  83.1  PLT 336  329   < > 291 288 315 327 328   < > = values in this interval not displayed.   Cardiac Enzymes: No results for input(s): CKTOTAL, CKMB, CKMBINDEX, TROPONINI in the last 168 hours. BNP: Invalid input(s): POCBNP CBG: No results for input(s): GLUCAP in the last 168 hours. D-Dimer No results for input(s): DDIMER in the last 72 hours. Hgb A1c No results for input(s): HGBA1C in the last 72 hours. Lipid Profile No results for input(s): CHOL, HDL, LDLCALC, TRIG, CHOLHDL, LDLDIRECT in the last 72 hours. Thyroid  function studies No results for input(s): TSH, T4TOTAL, T3FREE, THYROIDAB in the last 72 hours.  Invalid input(s): FREET3 Anemia work up No results for input(s): VITAMINB12, FOLATE, FERRITIN, TIBC, IRON, RETICCTPCT in the last 72 hours. Urinalysis    Component Value Date/Time   COLORURINE YELLOW (A) 02/29/2024 0758   APPEARANCEUR HAZY (A) 02/29/2024 0758   LABSPEC 1.020 02/29/2024 0758   PHURINE 5.0 02/29/2024 0758   GLUCOSEU NEGATIVE 02/29/2024 0758   GLUCOSEU NEGATIVE 11/26/2023 0809   HGBUR NEGATIVE 02/29/2024 0758   BILIRUBINUR NEGATIVE 02/29/2024 0758   BILIRUBINUR neg 11/19/2023 1153    KETONESUR NEGATIVE 02/29/2024 0758   PROTEINUR NEGATIVE 02/29/2024 0758   UROBILINOGEN 0.2 11/26/2023 0809   NITRITE NEGATIVE 02/29/2024 0758   LEUKOCYTESUR TRACE (A) 02/29/2024 0758   Sepsis Labs Recent Labs  Lab 03/02/24 0445 03/03/24 0517 03/04/24 0500 03/05/24 0459  WBC 3.6* 3.1* 3.1* 3.9*   Microbiology No results found for this or any previous visit (from the past 240 hours).   Time coordinating discharge: 40 minutes  SIGNED:   Calvin KATHEE Robson, MD  [facility] 03/05/2024, 11:29 AM Pager   If 7PM-7AM, please contact night-coverage     [1]  Allergies Allergen Reactions   Aczone [Dapsone] Swelling   Levaquin  [Levofloxacin ]     Presumed cause of joint aches

## 2024-03-05 NOTE — Discharge Instructions (Signed)
 In addition to included general instructions,  Diet: Recommend following a low fiber diet for 1-2 weeks after discharge. Ultimately, transition to a high fiber diet, maintain hydration, and avoid constipation  Activity: No restrictions  Call office [phone] / [phone] - SURGERY) at any time if any questions, worsening pain, fevers/chills, intractable nausea/emesis, or other concerns.

## 2024-03-08 ENCOUNTER — Telehealth: Payer: Self-pay

## 2024-03-08 NOTE — Transitions of Care (Post Inpatient/ED Visit) (Unsigned)
" ° °  03/08/2024  Name: Ashley [name] MRN: [id number] DOB: [birth year]  Today's TOC FU Call Status: Today's TOC FU Call Status:: Unsuccessful Call (1st Attempt) Unsuccessful Call (1st Attempt) Date: 03/08/24  Attempted to reach the patient regarding the most recent Inpatient/ED visit.  Follow Up Plan: Additional outreach attempts will be made to reach the patient to complete the Transitions of Care (Post Inpatient/ED visit) call.   Signature  Charmaine Bloodgood, LPN [facility] Health Advisor [place] [facility] You Are. We Are. [facility] Direct Dial [phone]  "

## 2024-03-10 NOTE — Transitions of Care (Post Inpatient/ED Visit) (Unsigned)
" ° °  03/10/2024  Name: Ashley [name] MRN: [id number] DOB: 1965-05-16  Today's TOC FU Call Status: Today's TOC FU Call Status:: Unsuccessful Call (2nd Attempt) Unsuccessful Call (1st Attempt) Date: 03/08/24 Unsuccessful Call (2nd Attempt) Date: 03/10/24  Attempted to reach the patient regarding the most recent Inpatient/ED visit.  Follow Up Plan: Additional outreach attempts will be made to reach the patient to complete the Transitions of Care (Post Inpatient/ED visit) call.   Signature Julian Lemmings, LPN [facility] Nurse Health Advisor Direct Dial [phone]  "

## 2024-03-11 ENCOUNTER — Telehealth: Payer: Self-pay

## 2024-03-11 NOTE — Transitions of Care (Post Inpatient/ED Visit) (Signed)
 "  03/11/2024  Name: Ashley [name] MRN: [id number] DOB: 1965-04-19  Today's TOC FU Call Status: Today's TOC FU Call Status:: Successful TOC FU Call Completed TOC FU Call Complete Date: 03/11/24  Patient's Name and Date of Birth confirmed. Name, DOB  Transition Care Management Follow-up Telephone Call Date of Discharge: 03/05/24 Discharge Facility: [facility]) Type of Discharge: Inpatient Admission Primary Inpatient Discharge Diagnosis:: diverticulitis How have you been since you were released from the hospital?: Better Any questions or concerns?: No  Items Reviewed: Did you receive and understand the discharge instructions provided?: Yes Medications obtained,verified, and reconciled?: Yes (Medications Reviewed) Any new allergies since your discharge?: No Dietary orders reviewed?: Yes Do you have support at home?: Yes People in Home [RPT]: child(ren), adult  Medications Reviewed Today: Medications Reviewed Today     Reviewed by Emmitt Pan, LPN (Licensed Practical Nurse) on 03/11/24 at 1000  Med List Status: <None>   Medication Order Taking? Sig Documenting Provider Last Dose Status Informant  amoxicillin -clavulanate (AUGMENTIN ) 875-125 MG tablet [id number] Yes Take 1 tablet by mouth 2 (two) times daily for 10 days. Jhonny Calvin NOVAK, MD  Active   aspirin  81 MG EC tablet [id number] Yes Take by mouth. [provider]  Active   atorvastatin  (LIPITOR) 10 MG tablet [id number] Yes Take 1 tablet (10 mg total) by mouth daily. for cholesterol. Clark, Katherine K, NP  Active   azelastine  (ASTELIN ) 0.1 % nasal spray [id number] Yes Place 1 spray into both nostrils 2 (two) times daily. Use in each nostril as directed Corwin Antu, FNP  Active   cetirizine  (ZYRTEC ) 10 MG tablet [id number] Yes Take 1 tablet (10 mg total) by mouth at bedtime. For throat drainage Clark, Katherine K, NP  Active   clindamycin (CLEOCIN T) 1 % external solution  [id number] Yes SMARTSIG:Topical [provider]  Active   dexlansoprazole (DEXILANT) 60 MG capsule [id number] Yes Take 60 mg by mouth daily. [provider]  Active   famotidine  (PEPCID ) 20 MG tablet [id number] Yes Take 1 tablet (20 mg total) by mouth 2 (two) times daily. Brimage, Vondra, DO  Active   gabapentin (NEURONTIN) 600 MG tablet [id number] Yes Take 1,200 mg by mouth daily. [provider]  Active   hydrochlorothiazide  (HYDRODIURIL ) 25 MG tablet [id number] Yes Take 1 tablet (25 mg total) by mouth daily. for blood pressure. Clark, Katherine K, NP  Active   hydrocortisone 2.5 % cream [id number] Yes SMARTSIG:Topical [provider]  Active   ketoconazole (NIZORAL) 2 % cream [id number] Yes SMARTSIG:1 Application Topical 1 to 2 Times Daily [provider]  Active   omeprazole  (PRILOSEC) 40 MG capsule [id number] Yes Take 1 capsule (40 mg total) by mouth 2 (two) times daily. Dugal, Tabitha, FNP  Active   oxyCODONE  (OXY IR/ROXICODONE ) 5 MG immediate release tablet [id number] Yes Take 1 tablet (5 mg total) by mouth every 4 (four) hours as needed for severe pain (pain score 7-10). Jhonny Calvin NOVAK, MD  Active   tiZANidine  (ZANAFLEX ) 2 MG tablet [id number]  Take between 1mg  (Half a tablet) and 4mg  (two tablets) up to three times daily as needed for musculoskeletal pain or muscle spasms.  Patient not taking: Reported on 03/11/2024   [provider]  Active   valACYclovir  (VALTREX ) 500 MG tablet [id number] Yes Take 1 tablet (500 mg total) by mouth daily. For suppression.  Take two tablets for 5 days in the event of an outbreak. Anyanwu, Ugonna A, MD  Active  Home Care and Equipment/Supplies: Were Home Health Services Ordered?: NA Any new equipment or medical supplies ordered?: NA  Functional Questionnaire: Do you need assistance with bathing/showering or dressing?: No Do you need assistance with meal preparation?: No Do you need assistance  with eating?: No Do you have difficulty maintaining continence: No Do you need assistance with getting out of bed/getting out of a chair/moving?: No Do you have difficulty managing or taking your medications?: No  Follow up appointments reviewed: PCP Follow-up appointment confirmed?: Yes Date of PCP follow-up appointment?: 03/24/24 Follow-up Provider: [facility] Follow-up appointment confirmed?: Yes Date of Specialist follow-up appointment?: 03/11/24 Follow-Up Specialty Provider:: GI Do you need transportation to your follow-up appointment?: No Do you understand care options if your condition(s) worsen?: Yes-patient verbalized understanding    SIGNATURE Julian Lemmings, LPN [facility] Nurse Health Advisor Direct Dial [phone]  "

## 2024-03-12 ENCOUNTER — Other Ambulatory Visit: Payer: Self-pay | Admitting: Rheumatology

## 2024-03-12 ENCOUNTER — Encounter: Payer: Self-pay | Admitting: Rheumatology

## 2024-03-12 DIAGNOSIS — R9389 Abnormal findings on diagnostic imaging of other specified body structures: Secondary | ICD-10-CM

## 2024-03-12 DIAGNOSIS — G8929 Other chronic pain: Secondary | ICD-10-CM

## 2024-03-16 ENCOUNTER — Other Ambulatory Visit: Payer: Self-pay | Admitting: Physical Medicine & Rehabilitation

## 2024-03-16 DIAGNOSIS — G8929 Other chronic pain: Secondary | ICD-10-CM

## 2024-03-16 DIAGNOSIS — M542 Cervicalgia: Secondary | ICD-10-CM

## 2024-03-23 ENCOUNTER — Other Ambulatory Visit

## 2024-03-24 ENCOUNTER — Inpatient Hospital Stay: Admitting: Primary Care

## 2024-03-24 NOTE — Telephone Encounter (Signed)
 lvm for pt to call office to schedule appt.

## 2024-03-25 ENCOUNTER — Ambulatory Visit
Admission: RE | Admit: 2024-03-25 | Discharge: 2024-03-25 | Disposition: A | Discharge status: Home / Self Care | Source: Ambulatory Visit | Attending: Physical Medicine & Rehabilitation | Admitting: Physical Medicine & Rehabilitation

## 2024-03-25 DIAGNOSIS — M542 Cervicalgia: Secondary | ICD-10-CM

## 2024-03-25 DIAGNOSIS — G8929 Other chronic pain: Secondary | ICD-10-CM

## 2024-03-26 ENCOUNTER — Encounter: Payer: Self-pay | Admitting: Surgery

## 2024-03-26 ENCOUNTER — Ambulatory Visit: Admitting: Surgery

## 2024-03-26 VITALS — BP 150/91 | HR 80 | Ht 66.0 in | Wt 169.0 lb

## 2024-03-26 DIAGNOSIS — K5732 Diverticulitis of large intestine without perforation or abscess without bleeding: Secondary | ICD-10-CM

## 2024-03-26 DIAGNOSIS — K5792 Diverticulitis of intestine, part unspecified, without perforation or abscess without bleeding: Secondary | ICD-10-CM

## 2024-03-26 NOTE — Progress Notes (Signed)
 " 03/26/2024  History of Present Illness: Ashley [name] is a 59 y.o. female presenting for follow-up of acute diverticulitis.  Patient was admitted on 02/29/2024 with persistent acute diverticulitis.  She had previously been to the emergency room on 02/14/2024 for this and was started on Augmentin .  However the patient presented on 02/29/2024 because the pain was not improving.  She was admitted for IV antibiotics and was initially on IV ciprofloxacin  and Flagyl  but her symptoms had not fully improved with that and was then transition to IV Zosyn  with further resolution of her symptoms.  She was eventually discharged home on Augmentin  again on 03/05/2024.  She has followed up with gastroenterology with Dr. Aundria and she has a colonoscopy scheduled for 05/12/2024.  Today, the patient reports that she remains with some soreness in the lower abdomen but denies any pain.  She is able to tolerate her diet and is having normal bowel function and denies any nausea or vomiting.  She has been thinking more about her diverticulitis and she would like to proceed with surgical management in the future.  Past Medical History: Past Medical History:  Diagnosis Date   Abdominal pain 06/23/2018   Acute breast pain 01/30/2022   Acute midline low back pain without sciatica 09/10/2019   Acute right flank pain 06/29/2021   Adverse effect of drug 08/15/2019   Anemia    Bronchitis    2 years ago    [facility] arthritis 2019   base of left thumb    Constipation 04/07/2019   Discoloration of skin 05/17/2021   Diverticulitis of colon    GERD (gastroesophageal reflux disease)    diet controlled , no meds   Hydrosalpinx    bilateral    Hyperlipidemia    recent dx - currently diet control - no meds   Hypertension    Hypothyroid 2003   radio active iodine - no meds   Left-sided weakness 02/12/2019   Ovarian cyst, left 02/23/2016   Urinary frequency 11/30/2018   Vitamin B 12 deficiency      Past Surgical History: Past  Surgical History:  Procedure Laterality Date   CESAREAN SECTION     x 2   CHOLECYSTECTOMY  2008   COLONOSCOPY WITH PROPOFOL  N/A 08/06/2021   Procedure: COLONOSCOPY WITH PROPOFOL ;  Surgeon: Jinny Carmine, MD;  Location: [facility];  Service: Endoscopy;  Laterality: N/A;   DILATION AND CURETTAGE OF UTERUS     X 2   ESOPHAGOGASTRODUODENOSCOPY (EGD) WITH PROPOFOL  N/A 08/06/2021   Procedure: ESOPHAGOGASTRODUODENOSCOPY (EGD) WITH PROPOFOL ;  Surgeon: Jinny Carmine, MD;  Location: [facility];  Service: Endoscopy;  Laterality: N/A;   LAPAROSCOPIC BILATERAL SALPINGECTOMY Bilateral 07/29/2017   Procedure: LAPAROSCOPIC LEFT SALPINGOOPHRECTOMY: Partial right Salpingectomy;  Surgeon: Corene Coy, MD;  Location: [facility];  Service: Gynecology;  Laterality: Bilateral;   POLYPECTOMY  08/06/2021   Procedure: POLYPECTOMY;  Surgeon: Jinny Carmine, MD;  Location: [facility];  Service: Endoscopy;;   SHOULDER SURGERY  2016   arthroscopic   TOTAL ABDOMINAL HYSTERECTOMY  2006   Fibroids, benign pathology   TUBAL LIGATION     WISDOM TOOTH EXTRACTION     at age 53 yr   WRIST ARTHROSCOPY Right 2007    Home Medications: Prior to Admission medications  Medication Sig Start Date End Date Taking? Authorizing Provider  atorvastatin  (LIPITOR) 10 MG tablet Take 1 tablet (10 mg total) by mouth daily. for cholesterol. 11/19/23   Clark, Katherine K, NP  azelastine  (  ASTELIN ) 0.1 % nasal spray Place 1 spray into both nostrils 2 (two) times daily. Use in each nostril as directed 12/17/23   Corwin Antu, FNP  dexlansoprazole (DEXILANT) 60 MG capsule Take 60 mg by mouth daily. 12/17/23   [provider]  gabapentin (NEURONTIN) 600 MG tablet Take 1,200 mg by mouth daily. 01/25/20   [provider]  hydrochlorothiazide  (HYDRODIURIL ) 25 MG tablet Take 1 tablet (25 mg total) by mouth daily. for blood pressure. 08/20/23   Clark, Katherine K, NP  ketoconazole  (NIZORAL) 2 % cream SMARTSIG:1 Application Topical 1 to 2 Times Daily 09/15/23   [provider]  omeprazole  (PRILOSEC) 40 MG capsule Take 1 capsule (40 mg total) by mouth 2 (two) times daily. 12/17/23   Dugal, Tabitha, FNP  oxyCODONE  (OXY IR/ROXICODONE ) 5 MG immediate release tablet Take 1 tablet (5 mg total) by mouth every 4 (four) hours as needed for severe pain (pain score 7-10). 03/05/24   Jhonny Calvin NOVAK, MD  [Paused] tiZANidine  (ZANAFLEX ) 2 MG tablet Take between 1mg  (Half a tablet) and 4mg  (two tablets) up to three times daily as needed for musculoskeletal pain or muscle spasms. Patient not taking: Reported on 03/11/2024 Wait to take this until your doctor or other care provider tells you to start again. 10/16/23   [provider]  valACYclovir  (VALTREX ) 500 MG tablet Take 1 tablet (500 mg total) by mouth daily. For suppression.  Take two tablets for 5 days in the event of an outbreak. 03/06/23   Anyanwu, Ugonna A, MD    Allergies: Allergies[1]  Review of Systems: Review of Systems  Constitutional:  Negative for chills and fever.  Respiratory:  Negative for shortness of breath.   Cardiovascular:  Negative for chest pain.  Gastrointestinal:  Positive for abdominal pain (soreness in low abdomen). Negative for nausea and vomiting.  Genitourinary:  Negative for dysuria.    Physical Exam BP (!) 150/91   Pulse 80   Ht 5' 6 (1.676 m)   Wt 169 lb (76.7 kg)   SpO2 98%   BMI 27.28 kg/m  CONSTITUTIONAL: No acute distress, well-nourished HEENT:  Normocephalic, atraumatic, extraocular motion intact. RESPIRATORY:  Lungs are clear, and breath sounds are equal bilaterally. Normal respiratory effort without pathologic use of accessory muscles. CARDIOVASCULAR: Heart is regular without murmurs, gallops, or rubs. GI: The abdomen is soft, nondistended, with mild soreness to palpation in the lower abdomen.  No peritonitis.  NEUROLOGIC:  Motor and sensation is grossly normal.   Cranial nerves are grossly intact. PSYCH:  Alert and oriented to person, place and time. Affect is normal.  Labs/Imaging: CT abdomen/pelvis on 02/29/2024: IMPRESSION: 1. Colonic diverticulosis with uncomplicated acute mid sigmoid diverticulitis.  Recommend colonoscopy status post treatment and status post complete resolution of inflammatory changes to exclude an underlying lesion. 2. Status post cholecystectomy and hysterectomy .   Assessment and Plan: This is a 59 y.o. female with acute sigmoid diverticulitis  -- Discussed with the patient again the findings on her CT scan on admission showing acute uncomplicated diverticulitis.  She had not responded with outpatient management and was admitted to hospital for IV antibiotics and eventually discharged home on Augmentin .  She is feeling better now although still has some residual soreness in the lower abdomen.  Discussed with her that this could be perhaps from some residual inflammation versus scarring.  This should be improving with time as well.  She is currently scheduled for colonoscopy with Dr. Aundria on 05/12/2024 and is interested in  pursuing surgery for her diverticulitis. - Discussed with patient that we will wait until colonoscopy is completed to make sure there are no other findings within her colon that would change the surgical plan.  I discussed with the patient that given the multiple episodes that she has had even though these have been uncomplicated, I think it makes good sense to proceed with surgery to prevent further episodes or potential worsening of her episodes as this last episode was more severe compared to the prior. - Discussed with patient the potential for a robotic assisted sigmoidectomy with colorectal anastomosis.  Discussed with patient the potential for open surgery, the potential for a loop ileostomy, and the potential for an end colostomy.  For now, we will tentatively schedule her for surgery on 06/08/2024.  She will  follow-up with me on 05/17/2024 after her colonoscopy so we can go over the results and finalize surgical plans. - Patient is in agreement with this and all of her questions have been answered.  I spent 30 minutes dedicated to the care of this patient on the date of this encounter to include pre-visit review of records, face-to-face time with the patient discussing diagnosis and management, and any post-visit coordination of care.   Aloysius Sheree Plant, MD [facility]         [1]  Allergies Allergen Reactions   Aczone [Dapsone] Swelling   Levaquin  [Levofloxacin ]     Presumed cause of joint aches   "

## 2024-03-26 NOTE — Telephone Encounter (Signed)
 lvm for pt to call office to schedule appt.

## 2024-03-26 NOTE — Patient Instructions (Signed)
 We will see you back after you have your Colonoscopy, in the mean time if you have any questions or concerns please dont hesitate to call the office.  Inflamed Colon (Diverticulitis): What to Know  Diverticulitis is when small pouches in your colon get infected or swollen. This causes pain in your belly (abdomen) and watery poop (diarrhea). The small pouches are called diverticula. They may form if you have a condition called diverticulosis. What are the causes? You may get this condition if poop (stool) gets trapped in the pouches in your colon. The poop lets germs (bacteria) grow. This causes an infection. What increases the risk? You are more likely to get this condition if you have small pouches in your colon. You are also more likely to get it if: You are overweight or very overweight (obese). You do not exercise enough. You drink alcohol. You smoke. You eat a lot of red meat, like beef, pork, or lamb. You do not eat enough fiber. You are older than 60 years of age. What are the signs or symptoms? Pain in your belly. Pain is often on the left side, but it may be felt in other spots too. Fever and chills. Feeling like you may vomit. Vomiting. Having cramps. Feeling full. Changes in how often you poop. Blood in your poop. How is this treated? Most cases are treated at home. You may be told to: Take over-the-counter pain medicines. Only eat and drink clear liquids. Take antibiotics. Rest. Very bad cases may need to be treated at a hospital. Treatment may include: Not eating or drinking. Taking pain medicines. Getting antibiotics through an IV tube. Getting fluid and food through an IV tube. Having surgery. When you are feeling better, you may need to have a test to look at your colon (colonoscopy). Follow these instructions at home: Medicines Take over-the-counter and prescription medicines only as told by your doctor. These include: Fiber pills. Probiotics. Medicines to  make your poop soft (stool softeners). If you were prescribed antibiotics, take them as told by your doctor. Do not stop taking them even if you start to feel better. Ask your doctor if you should avoid driving or using machines while you are taking your medicine. Eating and drinking  Follow the diet told by your doctor. You may need to only eat and drink liquids. When you feel better, you may be able to eat more foods. You may also be told to eat a lot of fiber. Fiber helps you poop. Foods with fiber include berries, beans, lentils, and green vegetables. Try not to eat red meat. General instructions Do not smoke or use any products that contain nicotine or tobacco. If you need help quitting, ask your doctor. Exercise 3 or more times a week. Try to go for 30 minutes each time. Exercise enough to sweat and make your heart beat faster. Contact a doctor if: Your pain gets worse. You are not pooping like normal. Your symptoms do not get better. Your symptoms get worse very fast. You have a fever. You vomit more than one time. You have poop that is: Bloody. Black. Tarry. This information is not intended to replace advice given to you by your health care provider. Make sure you discuss any questions you have with your health care provider. Document Revised: 12/21/2023 Document Reviewed: 11/08/2021 Elsevier Patient Education  2025 [employer].

## 2024-04-08 ENCOUNTER — Ambulatory Visit: Admitting: Obstetrics & Gynecology

## 2024-04-08 ENCOUNTER — Ambulatory Visit: Admitting: Primary Care

## 2024-05-12 ENCOUNTER — Ambulatory Visit: Admit: 2024-05-12 | Admitting: Internal Medicine

## 2024-05-12 SURGERY — COLONOSCOPY
Anesthesia: General

## 2024-05-17 ENCOUNTER — Ambulatory Visit: Admitting: Surgery

## 2024-06-08 ENCOUNTER — Inpatient Hospital Stay: Admit: 2024-06-08 | Admitting: Surgery
